# Patient Record
Sex: Male | Born: 1960 | Race: Black or African American | Hispanic: No | Smoking: Current every day smoker
Health system: Southern US, Community
[De-identification: ages and names within clinical notes are randomized; demographics above are authoritative.]

## PROBLEM LIST (undated history)

## (undated) ENCOUNTER — Emergency Department (HOSPITAL_COMMUNITY): Payer: Medicare Other

## (undated) DIAGNOSIS — K219 Gastro-esophageal reflux disease without esophagitis: Secondary | ICD-10-CM

## (undated) DIAGNOSIS — F32A Depression, unspecified: Secondary | ICD-10-CM

## (undated) DIAGNOSIS — F329 Major depressive disorder, single episode, unspecified: Secondary | ICD-10-CM

## (undated) DIAGNOSIS — M84439P Pathological fracture, unspecified ulna and radius, subsequent encounter for fracture with malunion: Secondary | ICD-10-CM

## (undated) DIAGNOSIS — I10 Essential (primary) hypertension: Secondary | ICD-10-CM

## (undated) HISTORY — PX: NO PAST SURGERIES: SHX2092

## (undated) HISTORY — PX: COLONOSCOPY: SHX174

---

## 2011-06-30 DIAGNOSIS — I10 Essential (primary) hypertension: Secondary | ICD-10-CM

## 2011-12-02 DIAGNOSIS — F1420 Cocaine dependence, uncomplicated: Secondary | ICD-10-CM | POA: Insufficient documentation

## 2012-06-03 HISTORY — PX: MANDIBLE FRACTURE SURGERY: SHX706

## 2013-04-10 DIAGNOSIS — F1290 Cannabis use, unspecified, uncomplicated: Secondary | ICD-10-CM | POA: Insufficient documentation

## 2013-04-10 DIAGNOSIS — F1210 Cannabis abuse, uncomplicated: Secondary | ICD-10-CM | POA: Insufficient documentation

## 2013-04-10 DIAGNOSIS — S0292XA Unspecified fracture of facial bones, initial encounter for closed fracture: Secondary | ICD-10-CM | POA: Insufficient documentation

## 2013-04-10 DIAGNOSIS — F1410 Cocaine abuse, uncomplicated: Secondary | ICD-10-CM | POA: Diagnosis present

## 2013-04-11 DIAGNOSIS — G8911 Acute pain due to trauma: Secondary | ICD-10-CM | POA: Insufficient documentation

## 2014-05-17 DIAGNOSIS — H5440 Blindness, one eye, unspecified eye: Secondary | ICD-10-CM | POA: Insufficient documentation

## 2014-05-17 DIAGNOSIS — H9191 Unspecified hearing loss, right ear: Secondary | ICD-10-CM | POA: Insufficient documentation

## 2014-05-17 DIAGNOSIS — F4310 Post-traumatic stress disorder, unspecified: Secondary | ICD-10-CM | POA: Insufficient documentation

## 2014-05-22 DIAGNOSIS — Z87898 Personal history of other specified conditions: Secondary | ICD-10-CM | POA: Insufficient documentation

## 2014-05-22 DIAGNOSIS — R001 Bradycardia, unspecified: Secondary | ICD-10-CM | POA: Insufficient documentation

## 2016-11-20 DIAGNOSIS — F3181 Bipolar II disorder: Secondary | ICD-10-CM | POA: Insufficient documentation

## 2016-11-20 DIAGNOSIS — H547 Unspecified visual loss: Secondary | ICD-10-CM | POA: Insufficient documentation

## 2017-04-22 ENCOUNTER — Ambulatory Visit (INDEPENDENT_AMBULATORY_CARE_PROVIDER_SITE_OTHER): Payer: Medicare Other

## 2017-04-22 ENCOUNTER — Ambulatory Visit (HOSPITAL_COMMUNITY): Payer: Medicare Other

## 2017-04-22 ENCOUNTER — Encounter (HOSPITAL_COMMUNITY): Payer: Self-pay | Admitting: Emergency Medicine

## 2017-04-22 ENCOUNTER — Other Ambulatory Visit: Payer: Self-pay

## 2017-04-22 ENCOUNTER — Ambulatory Visit (HOSPITAL_COMMUNITY)
Admission: EM | Admit: 2017-04-22 | Discharge: 2017-04-22 | Disposition: A | Discharge status: Home / Self Care | Payer: Medicare Other | Attending: Family Medicine | Admitting: Family Medicine

## 2017-04-22 DIAGNOSIS — M25571 Pain in right ankle and joints of right foot: Secondary | ICD-10-CM
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
## 2017-04-22 NOTE — Discharge Instructions (Signed)
1. Use over the counter pain relievers like ibuprofen or tylenol for pain  2. Alternate using ice and heat for swelling/discomfort.  3. Rest and elevate foot.  4. Please return if ankle fails to improve in 1-2 weeks.

## 2017-05-05 ENCOUNTER — Ambulatory Visit (INDEPENDENT_AMBULATORY_CARE_PROVIDER_SITE_OTHER): Payer: Self-pay | Admitting: Orthopaedic Surgery

## 2017-05-06 ENCOUNTER — Ambulatory Visit (INDEPENDENT_AMBULATORY_CARE_PROVIDER_SITE_OTHER): Payer: Worker's Compensation | Admitting: Orthopaedic Surgery

## 2017-05-06 ENCOUNTER — Ambulatory Visit (INDEPENDENT_AMBULATORY_CARE_PROVIDER_SITE_OTHER): Payer: Worker's Compensation

## 2017-05-06 DIAGNOSIS — S52224A Nondisplaced transverse fracture of shaft of right ulna, initial encounter for closed fracture: Secondary | ICD-10-CM | POA: Diagnosis not present

## 2017-05-06 DIAGNOSIS — S52201K Unspecified fracture of shaft of right ulna, subsequent encounter for closed fracture with nonunion: Secondary | ICD-10-CM | POA: Insufficient documentation

## 2017-05-06 MED ORDER — HYDROCODONE-ACETAMINOPHEN 5-325 MG PO TABS: 1.0000 | ORAL_TABLET | Freq: Two times a day (BID) | ORAL | 0 refills | Status: DC | PRN

## 2017-05-06 NOTE — Progress Notes (Signed)
Office Visit Note   Patient: Charles Estes           Date of Birth: April 15, 1961           MRN: [id number] Visit Date: 05/06/2017              Requested by: No referring provider defined for this encounter. PCP: System, Pcp Not In   Assessment & Plan: Visit Diagnoses:  1. Nondisplaced transverse fracture of shaft of right ulna, initial encounter for closed fracture     Plan: Impression is nondisplaced transverse ulnar fracture amenable to nonoperative treatment.  Given the location of the fracture I think a short arm cast will not provide enough stability therefore we will place him in a long-arm cast for 2 weeks.  Follow-up in 2 weeks for 2 view x-rays of the right forearm out of the cast. Total face to face encounter time was greater than 45 minutes and over half of this time was spent in counseling and/or coordination of care.  Follow-Up Instructions: Return in about 2 weeks (around 05/20/2017).   Orders:  Orders Placed This Encounter  Procedures  . XR Forearm Right   Meds ordered this encounter  Medications  . HYDROcodone-acetaminophen (NORCO) 5-325 MG tablet    Sig: Take 1-2 tablets by mouth 2 (two) times daily as needed.    Dispense:  30 tablet    Refill:  0      Procedures: No procedures performed   Clinical Data: No additional findings.   Subjective: Chief Complaint  Patient presents with  . Right Forearm - Injury    DOI 05/01/17 WORK COMP INJURY    Patient is a 56 year old gentleman who sustained a ulnar shaft fracture to his right arm on 05/01/2017 while at work.  He had his arm caught in a machine.  He was evaluated in the ED and placed in a sugar tong splint.  He was given follow-up today.  He denies any numbness and tingling.  He is right-handed and he works in [employer].    Review of Systems  Constitutional: Negative.   All other systems reviewed and are negative.    Objective: Vital Signs: There were no vitals taken for this  visit.  Physical Exam  Constitutional: He is oriented to person, place, and time. He appears well-developed and well-nourished.  HENT:  Head: Normocephalic and atraumatic.  Eyes: Pupils are equal, round, and reactive to light.  Neck: Neck supple.  Pulmonary/Chest: Effort normal.  Abdominal: Soft.  Musculoskeletal: Normal range of motion.  Neurological: He is alert and oriented to person, place, and time.  Skin: Skin is warm.  Psychiatric: He has a normal mood and affect. His behavior is normal. Judgment and thought content normal.  Nursing note and vitals reviewed.   Ortho Exam Right forearm exam shows tenderness over the fracture site.  Muscular compartments are soft.  Neurovascular intact. Specialty Comments:  No specialty comments available.  Imaging: Xr Forearm Right  Result Date: 05/06/2017 Nondisplaced ulnar fracture    PMFS History: Patient Active Problem List   Diagnosis Date Noted  . Nondisplaced transverse fracture of shaft of right ulna, initial encounter for closed fracture 05/06/2017   No past medical history on file.  No family history on file.  No past surgical history on file. Social History   Occupational History  . Not on file  Tobacco Use  . Smoking status: Never Smoker  . Smokeless tobacco: Never Used  Substance and Sexual Activity  . Alcohol  use: No    Frequency: Never  . Drug use: No  . Sexual activity: Not on file

## 2017-05-16 ENCOUNTER — Encounter (INDEPENDENT_AMBULATORY_CARE_PROVIDER_SITE_OTHER): Payer: Self-pay

## 2017-05-16 ENCOUNTER — Telehealth (INDEPENDENT_AMBULATORY_CARE_PROVIDER_SITE_OTHER): Payer: Self-pay | Admitting: Orthopaedic Surgery

## 2017-05-16 NOTE — Telephone Encounter (Signed)
Expect RTW in 6 weeks or so.

## 2017-05-16 NOTE — Telephone Encounter (Signed)
Note written

## 2017-05-16 NOTE — Telephone Encounter (Signed)
Please advise 

## 2017-05-16 NOTE — Telephone Encounter (Signed)
Patient is here at the clinic requesting a out of work note with dates of when he was taken off of work and when his expected return to work date will be.

## 2017-05-19 ENCOUNTER — Telehealth (INDEPENDENT_AMBULATORY_CARE_PROVIDER_SITE_OTHER): Payer: Self-pay

## 2017-05-19 NOTE — Telephone Encounter (Signed)
SEE MESSAGE

## 2017-05-19 NOTE — Telephone Encounter (Signed)
Patient has a follow up appt set up for tomorrow. WC adj wants Dr. Roda ShuttersXu to be aware that pts employer can accommodate any restrictions if Dr. Roda ShuttersXu thinks he can return to work light duty.

## 2017-05-19 NOTE — Telephone Encounter (Signed)
Faxed the 05/06/17 office and work note to wc adjustor per her request Fax#[phone]

## 2017-05-19 NOTE — Telephone Encounter (Signed)
Ok that's fine.  No use of affected arm for 6 weeks

## 2017-05-20 ENCOUNTER — Encounter (INDEPENDENT_AMBULATORY_CARE_PROVIDER_SITE_OTHER): Payer: Self-pay | Admitting: Orthopaedic Surgery

## 2017-05-20 ENCOUNTER — Ambulatory Visit (INDEPENDENT_AMBULATORY_CARE_PROVIDER_SITE_OTHER): Payer: Worker's Compensation | Admitting: Orthopaedic Surgery

## 2017-05-20 ENCOUNTER — Encounter (INDEPENDENT_AMBULATORY_CARE_PROVIDER_SITE_OTHER): Payer: Self-pay

## 2017-05-20 ENCOUNTER — Ambulatory Visit (INDEPENDENT_AMBULATORY_CARE_PROVIDER_SITE_OTHER): Payer: Worker's Compensation

## 2017-05-20 DIAGNOSIS — M79631 Pain in right forearm: Secondary | ICD-10-CM

## 2017-05-20 DIAGNOSIS — S52224A Nondisplaced transverse fracture of shaft of right ulna, initial encounter for closed fracture: Secondary | ICD-10-CM

## 2017-05-20 MED ORDER — SENNOSIDES-DOCUSATE SODIUM 8.6-50 MG PO TABS: 1.0000 | ORAL_TABLET | Freq: Two times a day (BID) | ORAL | 1 refills | Status: DC | PRN

## 2017-05-20 NOTE — Telephone Encounter (Signed)
Note faxed to [phone]

## 2017-05-20 NOTE — Telephone Encounter (Signed)
Note made.  

## 2017-05-20 NOTE — Progress Notes (Signed)
Patient is 2-week status post nondisplaced ulnar shaft fracture.  He has occasional pain.  Denies any real complaints.  X-rays show stable alignment.  He does have persistent swelling of his forearm.  The compartments are soft.  We converted him to a short arm cast today.  Continue nonweightbearing continue out of work.  Follow-up in 3 weeks.

## 2017-05-21 ENCOUNTER — Telehealth (INDEPENDENT_AMBULATORY_CARE_PROVIDER_SITE_OTHER): Payer: Self-pay | Admitting: Radiology

## 2017-05-21 ENCOUNTER — Other Ambulatory Visit (INDEPENDENT_AMBULATORY_CARE_PROVIDER_SITE_OTHER): Payer: Self-pay | Admitting: Family

## 2017-05-21 MED ORDER — HYDROCODONE-ACETAMINOPHEN 5-325 MG PO TABS: 1.0000 | ORAL_TABLET | Freq: Two times a day (BID) | ORAL | 0 refills | Status: DC | PRN

## 2017-05-21 NOTE — Telephone Encounter (Signed)
Patient called and is asking for a refill of hydrocodone.  He has an ulnar shaft fracture. Can you refill for him?  Roda ShuttersXu is in surgery.

## 2017-05-21 NOTE — Telephone Encounter (Signed)
Called pt and advised that rx is at the desk for pick up. 

## 2017-05-29 ENCOUNTER — Other Ambulatory Visit (INDEPENDENT_AMBULATORY_CARE_PROVIDER_SITE_OTHER): Payer: Self-pay | Admitting: Orthopaedic Surgery

## 2017-05-29 NOTE — Telephone Encounter (Signed)
Please advise. See message below.  

## 2017-05-29 NOTE — Telephone Encounter (Signed)
Patient called wanting to know if he could get a refill on his Hydrocodone. Pharmacy only filled a 5 day supply and he was requesting a 15 day supply if possible. CB # [phone]

## 2017-05-29 NOTE — Telephone Encounter (Signed)
That's fine

## 2017-05-30 MED ORDER — HYDROCODONE-ACETAMINOPHEN 5-325 MG PO TABS: 1.0000 | ORAL_TABLET | Freq: Two times a day (BID) | ORAL | 0 refills | Status: DC | PRN

## 2017-05-30 NOTE — Telephone Encounter (Signed)
IC patient and advised Rx ready for pickup 

## 2017-05-30 NOTE — Addendum Note (Signed)
Addended by: Cherre HugerMAY, Ardice Boyan E on: 05/30/2017 08:58 AM   Modules accepted: Orders

## 2017-05-30 NOTE — Telephone Encounter (Signed)
Rx printed to sign. 

## 2017-06-10 ENCOUNTER — Ambulatory Visit (INDEPENDENT_AMBULATORY_CARE_PROVIDER_SITE_OTHER): Payer: Worker's Compensation

## 2017-06-10 ENCOUNTER — Ambulatory Visit (INDEPENDENT_AMBULATORY_CARE_PROVIDER_SITE_OTHER): Payer: Worker's Compensation | Admitting: Orthopaedic Surgery

## 2017-06-10 ENCOUNTER — Encounter (INDEPENDENT_AMBULATORY_CARE_PROVIDER_SITE_OTHER): Payer: Self-pay | Admitting: Orthopaedic Surgery

## 2017-06-10 DIAGNOSIS — M7021 Olecranon bursitis, right elbow: Secondary | ICD-10-CM

## 2017-06-10 DIAGNOSIS — S52224A Nondisplaced transverse fracture of shaft of right ulna, initial encounter for closed fracture: Secondary | ICD-10-CM | POA: Diagnosis not present

## 2017-06-10 DIAGNOSIS — M79631 Pain in right forearm: Secondary | ICD-10-CM | POA: Diagnosis not present

## 2017-06-10 MED ORDER — METHYLPREDNISOLONE ACETATE 40 MG/ML IJ SUSP
13.3300 mg | INTRAMUSCULAR | Status: AC | PRN
  Administered 2017-06-10: 13.33 mg via INTRA_ARTICULAR

## 2017-06-10 MED ORDER — LIDOCAINE HCL 1 % IJ SOLN
1.0000 mL | INTRAMUSCULAR | Status: AC | PRN
  Administered 2017-06-10: 1 mL

## 2017-06-10 MED ORDER — VITAMIN D (ERGOCALCIFEROL) 1.25 MG (50000 UNIT) PO CAPS: 50000.0000 [IU] | ORAL_CAPSULE | ORAL | 0 refills | Status: DC

## 2017-06-10 MED ORDER — BUPIVACAINE HCL 0.25 % IJ SOLN
0.6600 mL | INTRAMUSCULAR | Status: AC | PRN
  Administered 2017-06-10: .66 mL via INTRA_ARTICULAR

## 2017-06-10 NOTE — Progress Notes (Signed)
   Office Visit Note   Patient: Charles Estes           Date of Birth: [birth year]           MRN: [id number] Visit Date: 06/10/2017              Requested by: No referring provider defined for this encounter. PCP: System, Pcp Not In   Assessment & Plan: Visit Diagnoses:  1. Nondisplaced transverse fracture of shaft of right ulna, initial encounter for closed fracture   2. Right forearm pain   3. Olecranon bursitis, right elbow     Plan: Impression #1 nondisplaced transverse fracture right ulnar shaft.  #2 right elbow olecranon bursitis.  In regards to the ulnar shaft fracture we are going to place down him in a removable splint.  He is to wear this at all times however we are going to let him come out or physical therapy.  He will work on elbow and wrist range of motion.  I am also going to start him on prescription strength vitamin D.  Regards to his olecranon bursa we are going to aspirate this and injected with cortisone today.  He will follow-up with us in 5 weeks time for repeat evaluation and x-ray of his ulnar shaft fracture.  Follow-Up Instructions: Return in about 5 weeks (around 07/15/2017).   Orders:  Orders Placed This Encounter  Procedures  . XR Forearm Right   No orders of the defined types were placed in this encounter.     Procedures: Medium Joint Inj: R olecranon bursa on 06/10/2017 3:00 PM Details: 22 G needle Medications: 1 mL lidocaine 1 %; 13.33 mg methylPREDNISolone acetate 40 MG/ML; 0.66 mL bupivacaine 0.25 %      Clinical Data: No additional findings.   Subjective: Chief Complaint  Patient presents with  . Right Forearm - Follow-up    HPI Lupita LeashDonna comes in, he is about 5 weeks out ulnar shaft fracture.  He has been in a short arm splint.  He has been doing well in regards to his forearm pain however he does note new onset of swelling to the posterior aspect of his elbow.  No injury no recent bug bite.  No drainage to the area.  Review of Systems as  detailed in HPI, all others reviewed and are negative.   Objective: Vital Signs: There were no vitals taken for this visit.  Physical Exam well-developed well-nourished gentleman in no acute distress.  Alert and oriented x3.  Ortho Exam examination of his right forearm reveals moderate tenderness to the ulnar shaft.  Moderate effusion to the olecranon bursa without tenseness.  Specialty Comments:  No specialty comments available.  Imaging: Xr Forearm Right  Result Date: 06/10/2017 Slowly healing ulnar shaft fracture without further displacement.    PMFS History: Patient Active Problem List   Diagnosis Date Noted  . Olecranon bursitis, right elbow 06/10/2017  . Nondisplaced transverse fracture of shaft of right ulna, initial encounter for closed fracture 05/06/2017   No past medical history on file.  No family history on file.  No past surgical history on file. Social History   Occupational History  . Not on file  Tobacco Use  . Smoking status: Never Smoker  . Smokeless tobacco: Never Used  Substance and Sexual Activity  . Alcohol use: No    Frequency: Never  . Drug use: No  . Sexual activity: Not on file

## 2017-06-11 ENCOUNTER — Encounter (INDEPENDENT_AMBULATORY_CARE_PROVIDER_SITE_OTHER): Payer: Self-pay | Admitting: Orthopedic Surgery

## 2017-06-11 ENCOUNTER — Telehealth (INDEPENDENT_AMBULATORY_CARE_PROVIDER_SITE_OTHER): Payer: Self-pay

## 2017-06-11 ENCOUNTER — Ambulatory Visit (INDEPENDENT_AMBULATORY_CARE_PROVIDER_SITE_OTHER): Payer: Worker's Compensation | Admitting: Orthopedic Surgery

## 2017-06-11 DIAGNOSIS — S52224A Nondisplaced transverse fracture of shaft of right ulna, initial encounter for closed fracture: Secondary | ICD-10-CM

## 2017-06-11 MED ORDER — HYDROCODONE-ACETAMINOPHEN 5-325 MG PO TABS: ORAL_TABLET | ORAL | 0 refills | Status: DC

## 2017-06-11 NOTE — Telephone Encounter (Signed)
Faxed the 06/10/17 office note and work note to the wc adj per her request. Fax#[phone].

## 2017-06-13 ENCOUNTER — Encounter (INDEPENDENT_AMBULATORY_CARE_PROVIDER_SITE_OTHER): Payer: Self-pay | Admitting: Orthopedic Surgery

## 2017-06-13 NOTE — Progress Notes (Signed)
   Post-Op Visit Note   Patient: Charles Estes           Date of Birth: 11-16-1960           MRN: [id number] Visit Date: 06/11/2017 PCP: System, Pcp Not In   Assessment & Plan:  Chief Complaint:  Chief Complaint  Patient presents with  . Arm Pain   Visit Diagnoses:  1. Nondisplaced transverse fracture of shaft of right ulna, initial encounter for closed fracture     Plan: Patient presents for follow-up of right ulnar shaft fracture.  The patient has been in a splint since yesterday.  He reports significant worsening of the pain since he was placed out of the cast and into the splint.  He is requesting another cast today.  He is also fairly adamantly requesting a change in provider.  This is done to accommodate patient wishes only.  His care to date has been entirely appropriate and exactly what I would have done.  On examination he does have some swelling and tenderness around the ulnar shaft fracture.  Radiographs show some angulation of the fracture but callus formation is present around the fracture edges.  These are from radiographs done yesterday.  Radiographs are shown to the patient.  Plan at this time is for a long short arm cast to be reapplied.  I think this fracture is at risk for nonunion based on the appearance of the x-rays at this point in time.  2-week return cast removal repeat radiographs and assessment.  We may need to get a CT scan at that time if there is not abundant callus formation.  Follow-Up Instructions: Return in about 2 weeks (around 06/25/2017).   Orders:  No orders of the defined types were placed in this encounter.  Meds ordered this encounter  Medications  . HYDROcodone-acetaminophen (NORCO) 5-325 MG tablet    Sig: 1 po q 8 hours prn pain    Dispense:  30 tablet    Refill:  0    Imaging: No results found.  PMFS History: Patient Active Problem List   Diagnosis Date Noted  . Olecranon bursitis, right elbow 06/10/2017  . Nondisplaced transverse  fracture of shaft of right ulna, initial encounter for closed fracture 05/06/2017   History reviewed. No pertinent past medical history.  History reviewed. No pertinent family history.  History reviewed. No pertinent surgical history. Social History   Occupational History  . Not on file  Tobacco Use  . Smoking status: Never Smoker  . Smokeless tobacco: Never Used  Substance and Sexual Activity  . Alcohol use: No    Frequency: Never  . Drug use: No  . Sexual activity: Not on file

## 2017-06-16 ENCOUNTER — Telehealth (INDEPENDENT_AMBULATORY_CARE_PROVIDER_SITE_OTHER): Payer: Self-pay

## 2017-06-16 NOTE — Telephone Encounter (Signed)
Go back to previous work restrictions

## 2017-06-16 NOTE — Telephone Encounter (Signed)
[facility] to [facility] why pt is now needing to be out of work completely when he had one handed work restriction before and employer was able to accommodate? Just needs this clarified for the employer.

## 2017-06-16 NOTE — Telephone Encounter (Signed)
Please advise. See message below.  

## 2017-06-17 ENCOUNTER — Encounter (INDEPENDENT_AMBULATORY_CARE_PROVIDER_SITE_OTHER): Payer: Self-pay

## 2017-06-17 NOTE — Telephone Encounter (Signed)
Called and [name] would like a faxed copy to her faxed to [phone] ATTN JENNIFER .

## 2017-06-26 ENCOUNTER — Ambulatory Visit (INDEPENDENT_AMBULATORY_CARE_PROVIDER_SITE_OTHER): Payer: Worker's Compensation

## 2017-06-26 ENCOUNTER — Ambulatory Visit (INDEPENDENT_AMBULATORY_CARE_PROVIDER_SITE_OTHER): Payer: Worker's Compensation | Admitting: Orthopaedic Surgery

## 2017-06-26 ENCOUNTER — Encounter (INDEPENDENT_AMBULATORY_CARE_PROVIDER_SITE_OTHER): Payer: Self-pay | Admitting: Orthopaedic Surgery

## 2017-06-26 DIAGNOSIS — S52234K Nondisplaced oblique fracture of shaft of right ulna, subsequent encounter for closed fracture with nonunion: Secondary | ICD-10-CM | POA: Diagnosis not present

## 2017-06-26 NOTE — Progress Notes (Signed)
   Office Visit Note   Patient: Charles Estes           Date of Birth: June 04, 1960           MRN: [id number] Visit Date: 06/26/2017              Requested by: No referring provider defined for this encounter. PCP: System, Pcp Not In   Assessment & Plan: Visit Diagnoses:  1. 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  At this point xrays have not demonstrated sufficient evidence of healing and patient continues to be symptomatic.  We discussed nonsurgical option of continued immobilization and bone stimulator and closed observation vs. Surgical option of bone grafting and compression plating.  Surgery gives more reliable and predictable healing.  Patient agrees to proceed with surgery after a thorough discussion of r/b/a including continued nonunion, NV injury, pain, disability.   Total face to face encounter time was greater than 25 minutes and over half of this time was spent in counseling and/or coordination of care.  Follow-Up Instructions: Return if symptoms worsen or fail to improve.   Orders:  Orders Placed This Encounter  Procedures  . XR Forearm Right   No orders of the defined types were placed in this encounter.     Procedures: No procedures performed   Clinical Data: No additional findings.   Subjective: Chief Complaint  Patient presents with  . Right Forearm - Fracture, Follow-up    Charles Estes returns today for f/u of right ulna fracture he sustained 8 weeks ago.  He continues to be symptomatic.  His swelling is improved.      Review of Systems   Objective: Vital Signs: There were no vitals taken for this visit.  Physical Exam  Ortho Exam Right forearm exam shows ttp over fracture site.  He has limitation in supination/pronation 2/2 pain and immobilization. Specialty Comments:  No specialty comments available.  Imaging: Xr Forearm Right  Result Date:  06/26/2017 Persistent fracture line with nonbridging callus formation.      PMFS History: Patient Active Problem List   Diagnosis Date Noted  . Olecranon bursitis, right elbow 06/10/2017  . Nondisplaced transverse fracture of shaft of right ulna, initial encounter for closed fracture 05/06/2017   History reviewed. No pertinent past medical history.  History reviewed. No pertinent family history.  History reviewed. No pertinent surgical history. Social History   Occupational History  . Not on file  Tobacco Use  . Smoking status: Never Smoker  . Smokeless tobacco: Never Used  Substance and Sexual Activity  . Alcohol use: No    Frequency: Never  . Drug use: No  . Sexual activity: Not on file

## 2017-07-02 ENCOUNTER — Telehealth (INDEPENDENT_AMBULATORY_CARE_PROVIDER_SITE_OTHER): Payer: Self-pay

## 2017-07-02 NOTE — Telephone Encounter (Signed)
Faxed surgery request and last office note to wc adj Rubu Jayme [employer] @ [employer] P)[phone]

## 2017-07-07 NOTE — Telephone Encounter (Signed)
Spoke with Loraine LericheMark @ [phone] who is with risk management w/ pts employer and he said to call adj Tiffany Colon @ [phone] who is now the adj. Left message for her to call me back,

## 2017-07-07 NOTE — Telephone Encounter (Signed)
Left message for wc adj Ruby Jayme Cloud[employer]alez re auth status

## 2017-07-08 NOTE — Telephone Encounter (Signed)
Received email from adj stating surgery is approved. Advised Sherrie for scheduling.  [email or web address]

## 2017-07-14 ENCOUNTER — Encounter (HOSPITAL_BASED_OUTPATIENT_CLINIC_OR_DEPARTMENT_OTHER): Payer: Self-pay | Admitting: *Deleted

## 2017-07-14 ENCOUNTER — Other Ambulatory Visit: Payer: Self-pay

## 2017-07-14 ENCOUNTER — Other Ambulatory Visit (INDEPENDENT_AMBULATORY_CARE_PROVIDER_SITE_OTHER): Payer: Self-pay

## 2017-07-14 ENCOUNTER — Telehealth (INDEPENDENT_AMBULATORY_CARE_PROVIDER_SITE_OTHER): Payer: Self-pay | Admitting: Orthopaedic Surgery

## 2017-07-14 ENCOUNTER — Telehealth (INDEPENDENT_AMBULATORY_CARE_PROVIDER_SITE_OTHER): Payer: Self-pay | Admitting: Radiology

## 2017-07-14 MED ORDER — HYDROCODONE-ACETAMINOPHEN 5-325 MG PO TABS: ORAL_TABLET | ORAL | 0 refills | Status: DC

## 2017-07-14 NOTE — Telephone Encounter (Signed)
Rx printed pending signature.  

## 2017-07-14 NOTE — Telephone Encounter (Signed)
Patient aware Rx is ready for pick up. 

## 2017-07-14 NOTE — Telephone Encounter (Signed)
Patient called asking for a refill on his hydrocodone. CB # [phone] If he doesn't answer you can leave a message

## 2017-07-14 NOTE — Telephone Encounter (Signed)
Refill #10

## 2017-07-14 NOTE — Telephone Encounter (Signed)
Please advise 

## 2017-07-16 ENCOUNTER — Ambulatory Visit (HOSPITAL_BASED_OUTPATIENT_CLINIC_OR_DEPARTMENT_OTHER): Payer: No Typology Code available for payment source | Admitting: Anesthesiology

## 2017-07-16 ENCOUNTER — Ambulatory Visit (HOSPITAL_BASED_OUTPATIENT_CLINIC_OR_DEPARTMENT_OTHER)
Admission: RE | Admit: 2017-07-16 | Discharge: 2017-07-16 | Disposition: A | Discharge status: Home / Self Care | Payer: No Typology Code available for payment source | Source: Ambulatory Visit | Attending: Orthopaedic Surgery | Admitting: Orthopaedic Surgery

## 2017-07-16 ENCOUNTER — Encounter (HOSPITAL_BASED_OUTPATIENT_CLINIC_OR_DEPARTMENT_OTHER)
Admission: RE | Disposition: A | Discharge status: Home / Self Care | Payer: Self-pay | Source: Ambulatory Visit | Attending: Orthopaedic Surgery

## 2017-07-16 ENCOUNTER — Other Ambulatory Visit: Payer: Self-pay

## 2017-07-16 ENCOUNTER — Encounter (HOSPITAL_BASED_OUTPATIENT_CLINIC_OR_DEPARTMENT_OTHER): Payer: Self-pay | Admitting: Anesthesiology

## 2017-07-16 DIAGNOSIS — I10 Essential (primary) hypertension: Secondary | ICD-10-CM | POA: Diagnosis not present

## 2017-07-16 DIAGNOSIS — S52201A Unspecified fracture of shaft of right ulna, initial encounter for closed fracture: Secondary | ICD-10-CM | POA: Diagnosis present

## 2017-07-16 DIAGNOSIS — S52201K Unspecified fracture of shaft of right ulna, subsequent encounter for closed fracture with nonunion: Secondary | ICD-10-CM

## 2017-07-16 DIAGNOSIS — F329 Major depressive disorder, single episode, unspecified: Secondary | ICD-10-CM | POA: Insufficient documentation

## 2017-07-16 DIAGNOSIS — Z79899 Other long term (current) drug therapy: Secondary | ICD-10-CM | POA: Insufficient documentation

## 2017-07-16 DIAGNOSIS — X58XXXA Exposure to other specified factors, initial encounter: Secondary | ICD-10-CM | POA: Diagnosis not present

## 2017-07-16 HISTORY — DX: Gastro-esophageal reflux disease without esophagitis: K21.9

## 2017-07-16 HISTORY — PX: ORIF ULNAR FRACTURE: SHX5417

## 2017-07-16 HISTORY — DX: Pathological fracture, unspecified ulna and radius, subsequent encounter for fracture with malunion: M84.439P

## 2017-07-16 HISTORY — DX: Essential (primary) hypertension: I10

## 2017-07-16 HISTORY — DX: Major depressive disorder, single episode, unspecified: F32.9

## 2017-07-16 HISTORY — DX: Depression, unspecified: F32.A

## 2017-07-16 LAB — POCT I-STAT, CHEM 8
BUN: 11 mg/dL (ref 6–20)
CALCIUM ION: 0.96 mmol/L — AB (ref 1.15–1.40)
CREATININE: 1 mg/dL (ref 0.61–1.24)
Chloride: 110 mmol/L (ref 101–111)
Glucose, Bld: 99 mg/dL (ref 65–99)
HCT: 40 % (ref 39.0–52.0)
Hemoglobin: 13.6 g/dL (ref 13.0–17.0)
Potassium: 4 mmol/L (ref 3.5–5.1)
Sodium: 138 mmol/L (ref 135–145)
TCO2: 21 mmol/L — AB (ref 22–32)

## 2017-07-16 SURGERY — OPEN REDUCTION INTERNAL FIXATION (ORIF) ULNAR FRACTURE
Anesthesia: General | Site: Arm Lower | Laterality: Right

## 2017-07-16 MED ORDER — PROMETHAZINE HCL 25 MG PO TABS: 25.0000 mg | ORAL_TABLET | Freq: Four times a day (QID) | ORAL | 1 refills | Status: DC | PRN

## 2017-07-16 MED ORDER — OXYCODONE-ACETAMINOPHEN 5-325 MG PO TABS: 1.0000 | ORAL_TABLET | ORAL | 0 refills | Status: DC | PRN

## 2017-07-16 MED ORDER — MIDAZOLAM HCL 5 MG/5ML IJ SOLN
INTRAMUSCULAR | Status: DC | PRN
  Administered 2017-07-16: 2 mg via INTRAVENOUS

## 2017-07-16 MED ORDER — CEFAZOLIN SODIUM-DEXTROSE 2-4 GM/100ML-% IV SOLN
2.0000 g | INTRAVENOUS | Status: AC
  Administered 2017-07-16: 2 g via INTRAVENOUS

## 2017-07-16 MED ORDER — METOCLOPRAMIDE HCL 5 MG/ML IJ SOLN: 10.0000 mg | Freq: Once | INTRAMUSCULAR | Status: DC | PRN

## 2017-07-16 MED ORDER — PROPOFOL 10 MG/ML IV BOLUS
INTRAVENOUS | Status: AC
  Filled 2017-07-16: qty 20

## 2017-07-16 MED ORDER — EPHEDRINE 5 MG/ML INJ
INTRAVENOUS | Status: AC
  Filled 2017-07-16: qty 10

## 2017-07-16 MED ORDER — FENTANYL CITRATE (PF) 100 MCG/2ML IJ SOLN
INTRAMUSCULAR | Status: AC
  Filled 2017-07-16: qty 2

## 2017-07-16 MED ORDER — LACTATED RINGERS IV SOLN
INTRAVENOUS | Status: DC
  Administered 2017-07-16 (×2): via INTRAVENOUS

## 2017-07-16 MED ORDER — SENNOSIDES-DOCUSATE SODIUM 8.6-50 MG PO TABS: 1.0000 | ORAL_TABLET | Freq: Every evening | ORAL | 1 refills | Status: DC | PRN

## 2017-07-16 MED ORDER — ROPIVACAINE HCL 7.5 MG/ML IJ SOLN
INTRAMUSCULAR | Status: DC | PRN
  Administered 2017-07-16: 20 mL via PERINEURAL

## 2017-07-16 MED ORDER — OXYCODONE HCL ER 10 MG PO T12A: 10.0000 mg | EXTENDED_RELEASE_TABLET | Freq: Two times a day (BID) | ORAL | 0 refills | Status: DC

## 2017-07-16 MED ORDER — CEFAZOLIN SODIUM-DEXTROSE 2-4 GM/100ML-% IV SOLN
INTRAVENOUS | Status: AC
  Filled 2017-07-16: qty 100

## 2017-07-16 MED ORDER — MIDAZOLAM HCL 2 MG/2ML IJ SOLN
1.0000 mg | INTRAMUSCULAR | Status: DC | PRN
  Administered 2017-07-16: 2 mg via INTRAVENOUS

## 2017-07-16 MED ORDER — MIDAZOLAM HCL 2 MG/2ML IJ SOLN
INTRAMUSCULAR | Status: AC
  Filled 2017-07-16: qty 2

## 2017-07-16 MED ORDER — FENTANYL CITRATE (PF) 100 MCG/2ML IJ SOLN
50.0000 ug | INTRAMUSCULAR | Status: DC | PRN
  Administered 2017-07-16: 100 ug via INTRAVENOUS

## 2017-07-16 MED ORDER — PROPOFOL 10 MG/ML IV BOLUS
INTRAVENOUS | Status: DC | PRN
  Administered 2017-07-16: 200 mg via INTRAVENOUS
  Administered 2017-07-16: 100 mg via INTRAVENOUS

## 2017-07-16 MED ORDER — ZINC SULFATE 220 (50 ZN) MG PO CAPS: 220.0000 mg | ORAL_CAPSULE | Freq: Every day | ORAL | 0 refills | Status: DC

## 2017-07-16 MED ORDER — DEXAMETHASONE SODIUM PHOSPHATE 4 MG/ML IJ SOLN
INTRAMUSCULAR | Status: DC | PRN
  Administered 2017-07-16: 10 mg via INTRAVENOUS

## 2017-07-16 MED ORDER — LACTATED RINGERS IV SOLN: INTRAVENOUS | Status: DC

## 2017-07-16 MED ORDER — DEXAMETHASONE SODIUM PHOSPHATE 10 MG/ML IJ SOLN
INTRAMUSCULAR | Status: AC
  Filled 2017-07-16: qty 1

## 2017-07-16 MED ORDER — LIDOCAINE 2% (20 MG/ML) 5 ML SYRINGE
INTRAMUSCULAR | Status: AC
  Filled 2017-07-16: qty 5

## 2017-07-16 MED ORDER — CHLORHEXIDINE GLUCONATE 4 % EX LIQD: 60.0000 mL | Freq: Once | CUTANEOUS | Status: DC

## 2017-07-16 MED ORDER — ONDANSETRON HCL 4 MG/2ML IJ SOLN
INTRAMUSCULAR | Status: DC | PRN
  Administered 2017-07-16: 4 mg via INTRAVENOUS

## 2017-07-16 MED ORDER — CALCIUM CARBONATE-VITAMIN D 500-200 MG-UNIT PO TABS: 1.0000 | ORAL_TABLET | Freq: Three times a day (TID) | ORAL | 12 refills | Status: DC

## 2017-07-16 MED ORDER — LIDOCAINE HCL (CARDIAC) 20 MG/ML IV SOLN
INTRAVENOUS | Status: DC | PRN
  Administered 2017-07-16: 30 mg via INTRAVENOUS

## 2017-07-16 MED ORDER — TIZANIDINE HCL 4 MG PO TABS: 4.0000 mg | ORAL_TABLET | Freq: Four times a day (QID) | ORAL | 2 refills | Status: DC | PRN

## 2017-07-16 MED ORDER — SCOPOLAMINE 1 MG/3DAYS TD PT72: 1.0000 | MEDICATED_PATCH | Freq: Once | TRANSDERMAL | Status: DC | PRN

## 2017-07-16 MED ORDER — EPHEDRINE SULFATE 50 MG/ML IJ SOLN
INTRAMUSCULAR | Status: DC | PRN
  Administered 2017-07-16: 25 mg via INTRAVENOUS
  Administered 2017-07-16: 10 mg via INTRAVENOUS

## 2017-07-16 MED ORDER — MEPERIDINE HCL 25 MG/ML IJ SOLN: 6.2500 mg | INTRAMUSCULAR | Status: DC | PRN

## 2017-07-16 MED ORDER — LACTATED RINGERS IV SOLN
INTRAVENOUS | Status: DC
  Administered 2017-07-16: 09:00:00 via INTRAVENOUS

## 2017-07-16 MED ORDER — ONDANSETRON HCL 4 MG PO TABS: 4.0000 mg | ORAL_TABLET | Freq: Three times a day (TID) | ORAL | 0 refills | Status: DC | PRN

## 2017-07-16 MED ORDER — ONDANSETRON HCL 4 MG/2ML IJ SOLN
INTRAMUSCULAR | Status: AC
  Filled 2017-07-16: qty 2

## 2017-07-16 MED ORDER — FENTANYL CITRATE (PF) 100 MCG/2ML IJ SOLN: 25.0000 ug | INTRAMUSCULAR | Status: DC | PRN

## 2017-07-16 SURGICAL SUPPLY — 69 items
BANDAGE ACE 4X5 VEL STRL LF (GAUZE/BANDAGES/DRESSINGS) ×6 IMPLANT
BIT DRILL 2.6 (BIT) ×3 IMPLANT
BLADE HEX COATED 2.75 (ELECTRODE) ×3 IMPLANT
BLADE SURG 15 STRL LF DISP TIS (BLADE) ×1 IMPLANT
BLADE SURG 15 STRL SS (BLADE) ×2
BNDG COHESIVE 4X5 TAN STRL (GAUZE/BANDAGES/DRESSINGS) ×3 IMPLANT
BNDG ESMARK 4X9 LF (GAUZE/BANDAGES/DRESSINGS) ×3 IMPLANT
BONE CHIP PRESERV 5CC PCAN5 (Bone Implant) ×3 IMPLANT
CLOSURE WOUND 1/4X4 (GAUZE/BANDAGES/DRESSINGS)
CORD BIPOLAR FORCEPS 12FT (ELECTRODE) ×3 IMPLANT
COVER BACK TABLE 60X90IN (DRAPES) ×3 IMPLANT
CUFF TOURN SGL LL 18 NRW (TOURNIQUET CUFF) ×6 IMPLANT
DECANTER SPIKE VIAL GLASS SM (MISCELLANEOUS) IMPLANT
DERMABOND ADVANCED (GAUZE/BANDAGES/DRESSINGS) ×2
DERMABOND ADVANCED .7 DNX12 (GAUZE/BANDAGES/DRESSINGS) ×1 IMPLANT
DRAPE EXTREMITY T 121X128X90 (DRAPE) ×3 IMPLANT
DRAPE IMP U-DRAPE 54X76 (DRAPES) ×3 IMPLANT
DRAPE INCISE IOBAN 66X45 STRL (DRAPES) ×3 IMPLANT
DRAPE OEC MINIVIEW 54X84 (DRAPES) ×3 IMPLANT
DRAPE SURG 17X23 STRL (DRAPES) ×3 IMPLANT
GAUZE SPONGE 4X4 12PLY STRL (GAUZE/BANDAGES/DRESSINGS) ×3 IMPLANT
GAUZE XEROFORM 1X8 LF (GAUZE/BANDAGES/DRESSINGS) ×3 IMPLANT
GLOVE BIOGEL PI IND STRL 7.0 (GLOVE) ×1 IMPLANT
GLOVE BIOGEL PI INDICATOR 7.0 (GLOVE) ×2
GLOVE ECLIPSE 7.0 STRL STRAW (GLOVE) ×3 IMPLANT
GLOVE SKINSENSE NS SZ7.5 (GLOVE) ×2
GLOVE SKINSENSE STRL SZ7.5 (GLOVE) ×1 IMPLANT
GLOVE SURG SYN 7.5  E (GLOVE) ×2
GLOVE SURG SYN 7.5 E (GLOVE) ×1 IMPLANT
GOWN STRL REIN XL XLG (GOWN DISPOSABLE) ×3 IMPLANT
GOWN STRL REUS W/ TWL LRG LVL3 (GOWN DISPOSABLE) ×1 IMPLANT
GOWN STRL REUS W/TWL LRG LVL3 (GOWN DISPOSABLE) ×2
KIT INFUSE SMALL (Orthopedic Implant) ×3 IMPLANT
MANIFOLD NEPTUNE II (INSTRUMENTS) ×3 IMPLANT
NEEDLE HYPO 25X1 1.5 SAFETY (NEEDLE) IMPLANT
NS IRRIG 1000ML POUR BTL (IV SOLUTION) ×3 IMPLANT
PACK BASIN DAY SURGERY FS (CUSTOM PROCEDURE TRAY) ×3 IMPLANT
PAD CAST 4YDX4 CTTN HI CHSV (CAST SUPPLIES) ×1 IMPLANT
PADDING CAST ABS 4INX4YD NS (CAST SUPPLIES)
PADDING CAST ABS COTTON 4X4 ST (CAST SUPPLIES) IMPLANT
PADDING CAST COTTON 4X4 STRL (CAST SUPPLIES) ×2
PENCIL BUTTON HOLSTER BLD 10FT (ELECTRODE) IMPLANT
PLATE COMP STRT NARROW 8H (Plate) ×3 IMPLANT
SCREW BONE 3.5X16MM (Screw) ×3 IMPLANT
SCREW BONE 3.5X18MM (Screw) ×12 IMPLANT
SCREW BONE 3.5X20MM (Screw) ×3 IMPLANT
SHEET MEDIUM DRAPE 40X70 STRL (DRAPES) ×3 IMPLANT
SLEEVE SCD COMPRESS KNEE MED (MISCELLANEOUS) ×3 IMPLANT
SLING ARM FOAM STRAP LRG (SOFTGOODS) ×3 IMPLANT
SPLINT FIBERGLASS 4X30 (CAST SUPPLIES) ×3 IMPLANT
SPONGE LAP 18X18 X RAY DECT (DISPOSABLE) ×3 IMPLANT
STOCKINETTE 4X48 STRL (DRAPES) ×3 IMPLANT
STRIP CLOSURE SKIN 1/4X4 (GAUZE/BANDAGES/DRESSINGS) IMPLANT
SUCTION FRAZIER HANDLE 10FR (MISCELLANEOUS)
SUCTION TUBE FRAZIER 10FR DISP (MISCELLANEOUS) IMPLANT
SUT ETHILON 3 0 PS 1 (SUTURE) ×3 IMPLANT
SUT MNCRL AB 4-0 PS2 18 (SUTURE) IMPLANT
SUT VIC AB 0 CT1 27 (SUTURE) ×2
SUT VIC AB 0 CT1 27XBRD ANBCTR (SUTURE) ×1 IMPLANT
SUT VIC AB 2-0 CT1 27 (SUTURE) ×2
SUT VIC AB 2-0 CT1 TAPERPNT 27 (SUTURE) ×1 IMPLANT
SYR BULB 3OZ (MISCELLANEOUS) ×3 IMPLANT
SYR CONTROL 10ML LL (SYRINGE) IMPLANT
TOWEL OR 17X24 6PK STRL BLUE (TOWEL DISPOSABLE) ×6 IMPLANT
TOWEL OR NON WOVEN STRL DISP B (DISPOSABLE) ×3 IMPLANT
TUBE CONNECTING 20'X1/4 (TUBING) ×1
TUBE CONNECTING 20X1/4 (TUBING) ×2 IMPLANT
UNDERPAD 30X30 (UNDERPADS AND DIAPERS) ×3 IMPLANT
YANKAUER SUCT BULB TIP NO VENT (SUCTIONS) ×3 IMPLANT

## 2017-07-16 NOTE — H&P (Signed)
    PREOPERATIVE H&P  Chief Complaint: right ulna nonunion  HPI: Charles Estes is a 57 y.o. male who presents for surgical treatment of right ulna nonunion.  He denies any changes in medical history.  Past Medical History:  Diagnosis Date  . Depression   . GERD (gastroesophageal reflux disease)   . Hypertension   . Pathologic ulnar fracture with malunion    right   Past Surgical History:  Procedure Laterality Date  . MANDIBLE FRACTURE SURGERY  2014   Social History   Socioeconomic History  . Marital status: Single    Spouse name: None  . Number of children: None  . Years of education: None  . Highest education level: None  Social Needs  . Financial resource strain: None  . Food insecurity - worry: None  . Food insecurity - inability: None  . Transportation needs - medical: None  . Transportation needs - non-medical: None  Occupational History  . None  Tobacco Use  . Smoking status: Never Smoker  . Smokeless tobacco: Never Used  Substance and Sexual Activity  . Alcohol use: Yes    Frequency: Never    Comment: social  . Drug use: No  . Sexual activity: None  Other Topics Concern  . None  Social History Narrative  . None   History reviewed. No pertinent family history. Allergies  Allergen Reactions  . Paxil [Paroxetine]     Per pt sores in mouth and diarrhea    Prior to Admission medications   Medication Sig Start Date End Date Taking? Authorizing Provider  amLODipine (NORVASC) 10 MG tablet  04/15/17  Yes [provider]  FLUoxetine (PROZAC) 20 MG capsule Take by mouth. 05/05/17  Yes [provider]  HYDROcodone-acetaminophen (NORCO) 5-325 MG tablet 1 po q 8 hours prn pain 07/14/17  Yes Tarry KosXu, Anett Ranker M, MD  QUEtiapine Fumarate (SEROQUEL PO) Take 250 mg by mouth at bedtime.   Yes [provider]  senna-docusate (SENOKOT S) 8.6-50 MG tablet Take 1 tablet by mouth 2 (two) times daily as needed. 05/20/17  Yes Tarry KosXu, Benjamen Koelling M, MD      Positive ROS: All other systems have been reviewed and were otherwise negative with the exception of those mentioned in the HPI and as above.  Physical Exam: General: Alert, no acute distress Cardiovascular: No pedal edema Respiratory: No cyanosis, no use of accessory musculature GI: abdomen soft Skin: No lesions in the area of chief complaint Neurologic: Sensation intact distally Psychiatric: Patient is competent for consent with normal mood and affect Lymphatic: no lymphedema  MUSCULOSKELETAL: exam stable  Assessment: right ulna nonunion  Plan: Plan for Procedure(s): OPEN REDUCTION INTERNAL FIXATION (ORIF) RIGHT ULNA FRACTURE NONUNION  The risks benefits and alternatives were discussed with the patient including but not limited to the risks of nonoperative treatment, versus surgical intervention including infection, bleeding, nerve injury,  blood clots, cardiopulmonary complications, morbidity, mortality, among others, and they were willing to proceed.   Charles ArvinMichael Yaffa Seckman, MD   07/16/2017 8:27 AM

## 2017-07-16 NOTE — Anesthesia Preprocedure Evaluation (Signed)
Anesthesia Evaluation  Patient identified by MRN, date of birth, ID band Patient awake    Reviewed: Allergy & Precautions, NPO status , Patient's Chart, lab work & pertinent test results  Airway Mallampati: II  TM Distance: >3 FB Neck ROM: Full    Dental no notable dental hx.    Pulmonary neg pulmonary ROS,    Pulmonary exam normal breath sounds clear to auscultation       Cardiovascular hypertension, Pt. on medications negative cardio ROS Normal cardiovascular exam Rhythm:Regular Rate:Normal     Neuro/Psych negative neurological ROS  negative psych ROS   GI/Hepatic Neg liver ROS, GERD  Controlled,  Endo/Other  negative endocrine ROS  Renal/GU negative Renal ROS  negative genitourinary   Musculoskeletal negative musculoskeletal ROS (+)   Abdominal   Peds negative pediatric ROS (+)  Hematology negative hematology ROS (+)   Anesthesia Other Findings   Reproductive/Obstetrics negative OB ROS                             Anesthesia Physical Anesthesia Plan  ASA: II  Anesthesia Plan: General   Post-op Pain Management: GA combined w/ Regional for post-op pain   Induction: Intravenous  PONV Risk Score and Plan: 2 and Ondansetron and Treatment may vary due to age or medical condition  Airway Management Planned: LMA  Additional Equipment:   Intra-op Plan:   Post-operative Plan: Extubation in OR  Informed Consent: I have reviewed the patients History and Physical, chart, labs and discussed the procedure including the risks, benefits and alternatives for the proposed anesthesia with the patient or authorized representative who has indicated his/her understanding and acceptance.   Dental advisory given  Plan Discussed with: CRNA  Anesthesia Plan Comments:         Anesthesia Quick Evaluation

## 2017-07-16 NOTE — Op Note (Signed)
   Date of Surgery: 07/16/2017  INDICATIONS: [name] is a 57 y.o.-year-old male with a right ulna nonunion;  The patient did consent to the procedure after discussion of the risks and benefits.  PREOPERATIVE DIAGNOSIS: Hypertrophic nonunion of right ulnar shaft  POSTOPERATIVE DIAGNOSIS: Same.  PROCEDURE: Open reduction internal fixation of hypertrophic nonunion right ulna with compression plating  SURGEON: N. Glee ArvinMichael Xu, M.D.  ASSIST: Starlyn SkeansMary Lindsey MaldenStanbery, [place]; necessary for the timely completion of procedure and due to complexity of procedure.  ANESTHESIA:  general  IV FLUIDS AND URINE: See anesthesia.  ESTIMATED BLOOD LOSS: Minimal mL.  IMPLANTS: Stryker 8 hole 3.5 mm compression plate; 5 cc cancellus bone chips; 2.8 cc Infuse bone graft  DRAINS: None  COMPLICATIONS: None.  DESCRIPTION OF PROCEDURE: The patient was brought to the operating room and placed supine on the operating table.  The patient had been signed prior to the procedure and this was documented. The patient had the anesthesia placed by the anesthesiologist.  A time-out was performed to confirm that this was the correct patient, site, side and location. The patient did receive antibiotics prior to the incision and was re-dosed during the procedure as needed at indicated intervals.  A tourniquet was placed.  The patient had the operative extremity prepped and draped in the standard surgical fashion.    A longitudinal incision was made over the subcutaneous border of the ulnar shaft centered over the nonunion site.  Dissection was carried down to the fascia.  The FCU and ECU muscle bellies were elevated off of the ulnar shaft.  The nonunion was identified.  This was a fibrous hypertrophic nonunion.  Nonunion was then taken down using a rongeur, osteotome, bur.  The bur was used to debride the bone back to punctate bleeding.  After this was done we then placed a 3.5 mm compression plate on the dorsal aspect of the ulna.   Nonlocking bicortical screws were placed each with excellent purchase.  I was able to gain compression of the fracture through the plate by drilling eccentrically.  The compression of the fracture was visualized as I tightened the screws down.  This gave excellent cortical contact.  After this was done I then placed 2.8 cc of infused bone graft with 5 cc of cancellus bone chips within the fracture site.  The wound was then thoroughly irrigated.  Hemostasis was obtained.  The surgical wound was closed in a layered fashion using 0 Vicryl for the fascia, 2-0 Vicryl for the subcutaneous layer and 3-0 nylon for the skin.  Sterile dressings were applied.  The arm was placed in a sugar tong splint.  Patient tolerated procedure well had no immediate complications.  POSTOPERATIVE PLAN: Patient will return to the office in 2 weeks for suture removal.  He will need 2 view x-rays of the right forearm on return.  He is to remain out of work until follow-up.  Mayra ReelN. Michael Xu, MD [facility] [phone] 10:59 AM

## 2017-07-16 NOTE — Addendum Note (Signed)
Addendum  created 07/16/17 1413 by [place] DesanctisLinka, Rigel Filsinger L, CRNA   Charge Capture section accepted

## 2017-07-16 NOTE — Anesthesia Procedure Notes (Signed)
Anesthesia Regional Block: Supraclavicular block   Pre-Anesthetic Checklist: ,, timeout performed, Correct Patient, Correct Site, Correct Laterality, Correct Procedure, Correct Position, site marked, Risks and benefits discussed,  Surgical consent,  Pre-op evaluation,  At surgeon's request and post-op pain management  Laterality: Right and Upper  Prep: Maximum Sterile Barrier Precautions used, chloraprep       Needles:  Injection technique: Single-shot  Needle Type: Echogenic Stimulator Needle     Needle Length: 10cm      Additional Needles:   Procedures:,,,, ultrasound used (permanent image in chart),,,,  Narrative:  Start time: 07/16/2017 8:57 AM End time: 07/16/2017 9:07 AM Injection made incrementally with aspirations every 5 mL.  Performed by: Personally  Anesthesiologist: Phillips Groutarignan, Galo Sayed, MD  Additional Notes: Risks, benefits and alternative to block explained extensively.  Patient tolerated procedure well, without complications.

## 2017-07-16 NOTE — Transfer of Care (Signed)
Immediate Anesthesia Transfer of Care Note  Patient: Charles Estes  Procedure(s) Performed: OPEN REDUCTION INTERNAL FIXATION (ORIF) RIGHT ULNA FRACTURE NONUNION (Right Arm Lower)  Patient Location: PACU  Anesthesia Type:GA combined with regional for post-op pain  Level of Consciousness: sedated  Airway & Oxygen Therapy: Patient Spontanous Breathing and Patient connected to face mask oxygen  Post-op Assessment: Report given to RN and Post -op Vital signs reviewed and stable  Post vital signs: Reviewed and stable  Last Vitals:  Vitals:   07/16/17 0855 07/16/17 0900  BP: (!) 114/93 (!) 114/93  Pulse: 65 65  Resp:    Temp:    SpO2: 100% 100%    Last Pain:  Vitals:   07/16/17 0822  TempSrc: Oral         Complications: No apparent anesthesia complications

## 2017-07-16 NOTE — Discharge Instructions (Signed)
Postoperative instructions:  Weightbearing instructions: non weight bearing  Dressing instructions: Keep your dressing and/or splint clean and dry at all times.  It will be removed at your first post-operative appointment.  Your stitches and/or staples will be removed at this visit.  Incision instructions:  Do not soak your incision for 3 weeks after surgery.  If the incision gets wet, pat dry and do not scrub the incision.  Pain control:  You have been given a prescription to be taken as directed for post-operative pain control.  In addition, elevate the operative extremity above the heart at all times to prevent swelling and throbbing pain.  Take over-the-counter Colace, 100mg  by mouth twice a day while taking narcotic pain medications to help prevent constipation.  Follow up appointments: 1) 10-14 days for suture removal and wound check. 2) Dr. Roda Shutters as scheduled. 3. Remain out of work until follow up appointment   -------------------------------------------------------------------------------------------------------------  After Surgery Pain Control:  After your surgery, post-surgical discomfort or pain is likely. This discomfort can last several days to a few weeks. At certain times of the day your discomfort may be more intense.  Did you receive a nerve block?  A nerve block can provide pain relief for one hour to two days after your surgery. As long as the nerve block is working, you will experience little or no sensation in the area the surgeon operated on.  As the nerve block wears off, you will begin to experience pain or discomfort. It is very important that you begin taking your prescribed pain medication before the nerve block fully wears off. Treating your pain at the first sign of the block wearing off will ensure your pain is better controlled and more tolerable when full-sensation returns. Do not wait until the pain is intolerable, as the medicine will be less effective. It is  better to treat pain in advance than to try and catch up.  General Anesthesia:  If you did not receive a nerve block during your surgery, you will need to start taking your pain medication shortly after your surgery and should continue to do so as prescribed by your surgeon.  Pain Medication:  Most commonly we prescribe Vicodin and Percocet for post-operative pain. Both of these medications contain a combination of acetaminophen (Tylenol) and a narcotic to help control pain.   It takes between 30 and 45 minutes before pain medication starts to work. It is important to take your medication before your pain level gets too intense.   Nausea is a common side effect of many pain medications. You will want to eat something before taking your pain medicine to help prevent nausea.   If you are taking a prescription pain medication that contains acetaminophen, we recommend that you do not take additional over the counter acetaminophen (Tylenol).  Other pain relieving options:   Using a cold pack to ice the affected area a few times a day (15 to 20 minutes at a time) can help to relieve pain, reduce swelling and bruising.   Elevation of the affected area can also help to reduce pain and swelling.       Call your surgeon if you experience:   1.  Fever over 101.0. 2.  Inability to urinate. 3.  Nausea and/or vomiting. 4.  Extreme swelling or bruising at the surgical site. 5.  Continued bleeding from the incision. 6.  Increased pain, redness or drainage from the incision. 7.  Problems related to your pain medication. 8.  Any problems and/or concerns    Post Anesthesia Home Care Instructions  Activity: Get plenty of rest for the remainder of the day. A responsible individual must stay with you for 24 hours following the procedure.  For the next 24 hours, DO NOT: -Drive a car -Advertising copywriterperate machinery -Drink alcoholic beverages -Take any medication unless instructed by your physician -Make any  legal decisions or sign important papers.  Meals: Start with liquid foods such as gelatin or soup. Progress to regular foods as tolerated. Avoid greasy, spicy, heavy foods. If nausea and/or vomiting occur, drink only clear liquids until the nausea and/or vomiting subsides. Call your physician if vomiting continues.  Special Instructions/Symptoms: Your throat may feel dry or sore from the anesthesia or the breathing tube placed in your throat during surgery. If this causes discomfort, gargle with warm salt water. The discomfort should disappear within 24 hours.  If you had a scopolamine patch placed behind your ear for the management of post- operative nausea and/or vomiting:  1. The medication in the patch is effective for 72 hours, after which it should be removed.  Wrap patch in a tissue and discard in the trash. Wash hands thoroughly with soap and water. 2. You may remove the patch earlier than 72 hours if you experience unpleasant side effects which may include dry mouth, dizziness or visual disturbances. 3. Avoid touching the patch. Wash your hands with soap and water after contact with the patch.     Regional Anesthesia Blocks  1. Numbness or the inability to move the "blocked" extremity may last from 3-48 hours after placement. The length of time depends on the medication injected and your individual response to the medication. If the numbness is not going away after 48 hours, call your surgeon.  2. The extremity that is blocked will need to be protected until the numbness is gone and the  Strength has returned. Because you cannot feel it, you will need to take extra care to avoid injury. Because it may be weak, you may have difficulty moving it or using it. You may not know what position it is in without looking at it while the block is in effect.  3. For blocks in the legs and feet, returning to weight bearing and walking needs to be done carefully. You will need to wait until the  numbness is entirely gone and the strength has returned. You should be able to move your leg and foot normally before you try and bear weight or walk. You will need someone to be with you when you first try to ensure you do not fall and possibly risk injury.  4. Bruising and tenderness at the needle site are common side effects and will resolve in a few days.  5. Persistent numbness or new problems with movement should be communicated to the surgeon or the [facility] [phone])/ [facility] [phone]).

## 2017-07-16 NOTE — Anesthesia Postprocedure Evaluation (Signed)
Anesthesia Post Note  Patient: Charles Estes  Procedure(s) Performed: OPEN REDUCTION INTERNAL FIXATION (ORIF) RIGHT ULNA FRACTURE NONUNION (Right Arm Lower)     Patient location during evaluation: PACU Anesthesia Type: General Level of consciousness: awake and alert Pain management: pain level controlled Vital Signs Assessment: post-procedure vital signs reviewed and stable Respiratory status: spontaneous breathing, nonlabored ventilation, respiratory function stable and patient connected to nasal cannula oxygen Cardiovascular status: blood pressure returned to baseline and stable Postop Assessment: no apparent nausea or vomiting Anesthetic complications: no    Last Vitals:  Vitals:   07/16/17 1215 07/16/17 1230  BP: 138/84 132/82  Pulse: 72 73  Resp: 19 15  Temp:    SpO2: 96% 94%    Last Pain:  Vitals:   07/16/17 1230  TempSrc:   PainSc: 0-No pain                 Phillips Groutarignan, Marcianna Daily

## 2017-07-16 NOTE — Anesthesia Procedure Notes (Signed)
Procedure Name: LMA Insertion Date/Time: 07/16/2017 9:45 AM Performed by: Terral DesanctisLinka, Natosha Bou L, CRNA Pre-anesthesia Checklist: Patient identified, Emergency Drugs available, Suction available, Patient being monitored and Timeout performed Patient Re-evaluated:Patient Re-evaluated prior to induction Oxygen Delivery Method: Circle system utilized Preoxygenation: Pre-oxygenation with 100% oxygen Induction Type: IV induction Ventilation: Mask ventilation without difficulty LMA: LMA inserted LMA Size: 5.0 Number of attempts: 1 Airway Equipment and Method: Bite block Placement Confirmation: positive ETCO2 Tube secured with: Tape Dental Injury: Teeth and Oropharynx as per pre-operative assessment

## 2017-07-16 NOTE — Progress Notes (Signed)
Assisted Dr. Carignan with right, ultrasound guided, supraclavicular block. Side rails up, monitors on throughout procedure. See vital signs in flow sheet. Tolerated Procedure well. 

## 2017-07-17 ENCOUNTER — Encounter (HOSPITAL_BASED_OUTPATIENT_CLINIC_OR_DEPARTMENT_OTHER): Payer: Self-pay | Admitting: Orthopaedic Surgery

## 2017-07-18 ENCOUNTER — Ambulatory Visit (INDEPENDENT_AMBULATORY_CARE_PROVIDER_SITE_OTHER): Payer: Worker's Compensation | Admitting: Orthopaedic Surgery

## 2017-07-18 ENCOUNTER — Encounter (INDEPENDENT_AMBULATORY_CARE_PROVIDER_SITE_OTHER): Payer: Self-pay

## 2017-07-31 ENCOUNTER — Ambulatory Visit (INDEPENDENT_AMBULATORY_CARE_PROVIDER_SITE_OTHER): Payer: Worker's Compensation | Admitting: Orthopaedic Surgery

## 2017-07-31 ENCOUNTER — Ambulatory Visit (INDEPENDENT_AMBULATORY_CARE_PROVIDER_SITE_OTHER): Payer: Worker's Compensation

## 2017-07-31 DIAGNOSIS — S52234K Nondisplaced oblique fracture of shaft of right ulna, subsequent encounter for closed fracture with nonunion: Secondary | ICD-10-CM

## 2017-07-31 MED ORDER — OXYCODONE HCL 5 MG PO TABS: 5.0000 mg | ORAL_TABLET | Freq: Every day | ORAL | 0 refills | Status: DC | PRN

## 2017-07-31 NOTE — Progress Notes (Signed)
[name] is 2-week status post repair of his ulnar shaft nonunion.  He has no real complaints today.  His forearm compartments are soft.  The incision has healed without any evidence of infection.  He is neurovascularly intact.  His x-rays demonstrate stable fixation and alignment without any complications.  Today we remove the sutures and gave him a brace for his forearm.  He needs to begin OT for elbow and wrist range of motion.  Continue nonweightbearing.  Continue out of work for now.  Continue taking calcium, vitamin D, zinc to promote bone healing.  Prescription for bone stimulator.  This was all coordinated with the case manager who was present today.

## 2017-08-06 ENCOUNTER — Telehealth (INDEPENDENT_AMBULATORY_CARE_PROVIDER_SITE_OTHER): Payer: Self-pay

## 2017-08-06 NOTE — Telephone Encounter (Signed)
Faxed pts 07/31/17 office note, work note, and rx for bone stimulator to case mgr per his request

## 2017-08-15 ENCOUNTER — Telehealth (INDEPENDENT_AMBULATORY_CARE_PROVIDER_SITE_OTHER): Payer: Self-pay

## 2017-08-15 NOTE — Telephone Encounter (Signed)
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

## 2017-08-18 NOTE — Telephone Encounter (Signed)
Please advise 

## 2017-08-19 NOTE — Telephone Encounter (Signed)
Trey's bone stim.  Don't know the brand.

## 2017-08-19 NOTE — Telephone Encounter (Signed)
See message below. Do you need a script?

## 2017-08-20 NOTE — Telephone Encounter (Signed)
Emailed Thurston Poundsrey regarding the Brand of Bone Stim. Rx made will give to Amy.

## 2017-08-20 NOTE — Telephone Encounter (Signed)
[employer]. Cpt code [phone]. Gillie Mannersrey Stewart with [employer] is going to reach out to her to get set up.

## 2017-08-28 ENCOUNTER — Ambulatory Visit (INDEPENDENT_AMBULATORY_CARE_PROVIDER_SITE_OTHER): Payer: Worker's Compensation | Admitting: Orthopaedic Surgery

## 2017-08-28 ENCOUNTER — Ambulatory Visit (INDEPENDENT_AMBULATORY_CARE_PROVIDER_SITE_OTHER): Payer: Worker's Compensation

## 2017-08-28 ENCOUNTER — Encounter (INDEPENDENT_AMBULATORY_CARE_PROVIDER_SITE_OTHER): Payer: Self-pay | Admitting: Orthopaedic Surgery

## 2017-08-28 DIAGNOSIS — M79631 Pain in right forearm: Secondary | ICD-10-CM

## 2017-08-28 DIAGNOSIS — S52234K Nondisplaced oblique fracture of shaft of right ulna, subsequent encounter for closed fracture with nonunion: Secondary | ICD-10-CM

## 2017-08-28 MED ORDER — ZINC SULFATE 220 (50 ZN) MG PO CAPS: 220.0000 mg | ORAL_CAPSULE | Freq: Every day | ORAL | 0 refills | Status: DC

## 2017-08-28 NOTE — Progress Notes (Signed)
   Post-Op Visit Note   Patient: Charles Estes           Date of Birth: [birth year]           MRN: [id number] Visit Date: 08/28/2017 PCP: System, Pcp Not In   Assessment & Plan:  Chief Complaint:  Chief Complaint  Patient presents with  . Right Forearm - Pain   Visit Diagnoses:  1. 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 Right forearm pain     Plan: Patient is 6 weeks status post ORIF right ulnar shaft nonunion.  He is overall doing better.  His swelling is improved.  His surgical scar is fully healed.  He has no focal motor or sensory deficits.  He does have some expected stiffness of his elbow and wrist and pronation supination.  No signs of CRPS.  X-rays demonstrate increasing bony consolidation fracture healing without any hardware complications.  At this point we will have him start OT.  Continue with bone stimulator.  Light duty work note provided.  Follow-up in 6 weeks with 2 view x-rays of the right forearm.  Follow-Up Instructions: Return in about 6 weeks (around 10/09/2017).   Orders:  Orders Placed This Encounter  Procedures  . XR Forearm Right   Meds ordered this encounter  Medications  . zinc sulfate 220 (50 Zn) MG capsule    Sig: Take 1 capsule (220 mg total) by mouth daily.    Dispense:  42 capsule    Refill:  0    Imaging: Xr Forearm Right  Result Date: 08/28/2017 Stable fixation with evidence of increasing bony consolidation and fracture healing   PMFS History: Patient Active Problem List   Diagnosis Date Noted  . Olecranon bursitis, right elbow 06/10/2017  . 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 Depression   . GERD (gastroesophageal reflux disease)   . Hypertension   . Pathologic ulnar fracture with malunion    right    History reviewed. No pertinent family history.  Past Surgical History:  Procedure Laterality Date  . MANDIBLE FRACTURE SURGERY  2014  . ORIF  ULNAR FRACTURE Right 07/16/2017   Procedure: OPEN REDUCTION INTERNAL FIXATION (ORIF) RIGHT ULNA FRACTURE NONUNION;  Surgeon: Tarry KosXu, Naiping M, MD;  Location: [facility];  Service: Orthopedics;  Laterality: Right;   Social History   Occupational History  . Not on file  Tobacco Use  . Smoking status: Never Smoker  . Smokeless tobacco: Never Used  Substance and Sexual Activity  . Alcohol use: Yes    Frequency: Never    Comment: social  . Drug use: No  . Sexual activity: Not on file

## 2017-09-03 ENCOUNTER — Telehealth (INDEPENDENT_AMBULATORY_CARE_PROVIDER_SITE_OTHER): Payer: Self-pay | Admitting: Orthopaedic Surgery

## 2017-09-03 NOTE — Telephone Encounter (Signed)
Bernette RedbirdKenny Winter from [facility] would like to talk to you in regards to the bone stimulator the patient was sent.  UJ#[id number]#[phone].  Thank you.

## 2017-09-03 NOTE — Telephone Encounter (Signed)
Brenna from [facility] called asking for the patients PT orders to be faxed to [phone]

## 2017-09-04 ENCOUNTER — Telehealth (INDEPENDENT_AMBULATORY_CARE_PROVIDER_SITE_OTHER): Payer: Self-pay | Admitting: Orthopaedic Surgery

## 2017-09-04 NOTE — Telephone Encounter (Signed)
Is this okay?

## 2017-09-04 NOTE — Telephone Encounter (Signed)
yes

## 2017-09-04 NOTE — Telephone Encounter (Signed)
Matthew with [facility] is calling because they received orders for OT but the patient is already being treated for PT. Hes just calling to see if the orders will work for OT as well. Please advise # [phone]

## 2017-09-05 NOTE — Telephone Encounter (Signed)
Called Bernette RedbirdKenny Winter back from [facility] [phone] FAX [phone] States he just wanted to let us know that Bone stim should be worn for 3 hours but can break it down he states the patient is aware on how this should be done. Just wanted to let us know.  Original script for bone Stim sent to scan center. Bernette Redbird(Kenny dropped it off)

## 2017-09-05 NOTE — Telephone Encounter (Signed)
Written and faxed.  

## 2017-09-05 NOTE — Telephone Encounter (Signed)
What orders do you want me to fax to them? Be specific (WC)

## 2017-09-05 NOTE — Telephone Encounter (Signed)
Elbow and wrist ROM, strengthening, 25% PWB.

## 2017-09-05 NOTE — Telephone Encounter (Signed)
Called to advise.  

## 2017-10-09 ENCOUNTER — Encounter (INDEPENDENT_AMBULATORY_CARE_PROVIDER_SITE_OTHER): Payer: Self-pay | Admitting: Orthopaedic Surgery

## 2017-10-09 ENCOUNTER — Ambulatory Visit (INDEPENDENT_AMBULATORY_CARE_PROVIDER_SITE_OTHER): Payer: Worker's Compensation | Admitting: Orthopaedic Surgery

## 2017-10-09 ENCOUNTER — Ambulatory Visit (INDEPENDENT_AMBULATORY_CARE_PROVIDER_SITE_OTHER): Payer: Worker's Compensation

## 2017-10-09 DIAGNOSIS — S52234K Nondisplaced oblique fracture of shaft of right ulna, subsequent encounter for closed fracture with nonunion: Secondary | ICD-10-CM | POA: Diagnosis not present

## 2017-10-09 MED ORDER — TRAMADOL HCL 50 MG PO TABS: 50.0000 mg | ORAL_TABLET | Freq: Every day | ORAL | 2 refills | Status: DC | PRN

## 2017-10-09 NOTE — Progress Notes (Signed)
   Post-Op Visit Note   Patient: Charles Estes           Date of Birth: 03/14/1961           MRN: [id number] Visit Date: 10/09/2017 PCP: System, Pcp Not In   Assessment & Plan:  Chief Complaint:  Chief Complaint  Patient presents with  . Right Forearm - Pain   Visit Diagnoses:  1. Closed nondisplaced oblique fracture of shaft of right ulna with nonunion     Plan: Charles Estes is 85 days status post ORIF right ulnar shaft nonunion.  He comes in for follow-up.  He is overall doing better.  He has some stiffness and discomfort with his wrist mainly with therapy.  He denies any significant swelling or symptoms.  His x-rays do demonstrate continued healing and improved bony consolidation.  His range of motion is progressing very well.  At this point he may return to light duty with no lifting of the right arm.  Continue with physical therapy for continued strengthening and range of motion.  Follow-up in 6 weeks with 2 view x-rays of the right forearm.  Continue with bone stimulator.  Continue with calcium and vitamin D and zinc.  This was all communicated to the case manager who was present today  Follow-Up Instructions: Return in about 6 weeks (around 11/20/2017).   Orders:  Orders Placed This Encounter  Procedures  . XR Forearm Right   Meds ordered this encounter  Medications  . traMADol (ULTRAM) 50 MG tablet    Sig: Take 1-2 tablets (50-100 mg total) by mouth daily as needed.    Dispense:  30 tablet    Refill:  2    Imaging: Xr Forearm Right  Result Date: 10/09/2017 Evidence of continued bony consolidation and healing.  No complications.  Stable fixation.   PMFS History: Patient Active Problem List   Diagnosis Date Noted  . Olecranon bursitis, right elbow 06/10/2017  . 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 Depression   . GERD (gastroesophageal reflux disease)   . Hypertension   . Pathologic ulnar fracture with malunion    right    History reviewed. No pertinent family history.  Past Surgical History:  Procedure Laterality Date  . MANDIBLE FRACTURE SURGERY  2014  . ORIF ULNAR FRACTURE Right 07/16/2017   Procedure: OPEN REDUCTION INTERNAL FIXATION (ORIF) RIGHT ULNA FRACTURE NONUNION;  Surgeon: Tarry Kos, MD;  Location: [facility];  Service: Orthopedics;  Laterality: Right;   Social History   Occupational History  . Not on file  Tobacco Use  . Smoking status: Never Smoker  . Smokeless tobacco: Never Used  Substance and Sexual Activity  . Alcohol use: Yes    Frequency: Never    Comment: social  . Drug use: No  . Sexual activity: Not on file

## 2017-11-04 ENCOUNTER — Telehealth (INDEPENDENT_AMBULATORY_CARE_PROVIDER_SITE_OTHER): Payer: Self-pay | Admitting: Orthopaedic Surgery

## 2017-11-04 NOTE — Telephone Encounter (Signed)
[facility]  Charles Estes will fax authorization and approval info to office  [phone]    Contact Zella Richeriffany Collins if the office has questions per Ms.Charles Estes  Adjuster  [phone]   Pt approved for 12 sessions for Physical Therapy

## 2017-11-04 NOTE — Telephone Encounter (Signed)
FYI

## 2017-11-20 ENCOUNTER — Ambulatory Visit (INDEPENDENT_AMBULATORY_CARE_PROVIDER_SITE_OTHER): Payer: Worker's Compensation | Admitting: Orthopaedic Surgery

## 2017-11-20 ENCOUNTER — Ambulatory Visit (INDEPENDENT_AMBULATORY_CARE_PROVIDER_SITE_OTHER): Payer: Worker's Compensation

## 2017-11-20 ENCOUNTER — Encounter (INDEPENDENT_AMBULATORY_CARE_PROVIDER_SITE_OTHER): Payer: Self-pay | Admitting: Orthopaedic Surgery

## 2017-11-20 ENCOUNTER — Encounter (INDEPENDENT_AMBULATORY_CARE_PROVIDER_SITE_OTHER): Payer: Self-pay

## 2017-11-20 DIAGNOSIS — S52234K Nondisplaced oblique fracture of shaft of right ulna, subsequent encounter for closed fracture with nonunion: Secondary | ICD-10-CM | POA: Diagnosis not present

## 2017-11-20 DIAGNOSIS — M79631 Pain in right forearm: Secondary | ICD-10-CM | POA: Diagnosis not present

## 2017-11-20 NOTE — Progress Notes (Signed)
   Office Visit Note   Patient: Charles Estes           Date of Birth: [birth year]           MRN: [id number] Visit Date: 11/20/2017              Requested by: No referring provider defined for this encounter. PCP: System, Pcp Not In   Assessment & Plan: Visit Diagnoses:  1. 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 Right forearm pain     Plan: At this point patient is demonstrating good bony healing.  I would like to have him do 4 weeks of work conditioning.  He may lift up to 15 pounds at work.  Recheck in 4 weeks with 2 view x-rays of the right forearm.  Anticipate obtaining an FCE at that time. Total face to face encounter time was greater than 25 minutes and over half of this time was spent in counseling and/or coordination of care.  Follow-Up Instructions: Return in about 1 month (around 12/18/2017).   Orders:  Orders Placed This Encounter  Procedures  . XR Forearm Right   No orders of the defined types were placed in this encounter.     Procedures: No procedures performed   Clinical Data: No additional findings.   Subjective: Chief Complaint  Patient presents with  . Right Forearm - Pain, Follow-up    Charles Estes is 4 months status post ORIF of the right ulnar nonunion.  He is overall progressing and feeling better.  He has stopped using the bone stimulator.  He is progressing well with physical therapy.   Review of Systems   Objective: Vital Signs: There were no vitals taken for this visit.  Physical Exam  Ortho Exam Right forearm exam shows a fully healed surgical scar.  His range of motion of the elbow and forearm and wrist are significantly improved.  He still has some tightness that he feels in his forearm flexors when he extends his fingers fully.  His strength is improving. Specialty Comments:  No specialty comments available.  Imaging: Xr Forearm Right  Result Date: 11/20/2017 Significant improvement in  healing and bony consolidation.    PMFS History: Patient Active Problem List   Diagnosis Date Noted  . Olecranon bursitis, right elbow 06/10/2017  . 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 Depression   . GERD (gastroesophageal reflux disease)   . Hypertension   . Pathologic ulnar fracture with malunion    right    History reviewed. No pertinent family history.  Past Surgical History:  Procedure Laterality Date  . MANDIBLE FRACTURE SURGERY  2014  . ORIF ULNAR FRACTURE Right 07/16/2017   Procedure: OPEN REDUCTION INTERNAL FIXATION (ORIF) RIGHT ULNA FRACTURE NONUNION;  Surgeon: Tarry KosXu, Naiping M, MD;  Location: [facility];  Service: Orthopedics;  Laterality: Right;   Social History   Occupational History  . Not on file  Tobacco Use  . Smoking status: Never Smoker  . Smokeless tobacco: Never Used  Substance and Sexual Activity  . Alcohol use: Yes    Frequency: Never    Comment: social  . Drug use: No  . Sexual activity: Not on file

## 2017-12-18 ENCOUNTER — Encounter (INDEPENDENT_AMBULATORY_CARE_PROVIDER_SITE_OTHER): Payer: Self-pay | Admitting: Orthopaedic Surgery

## 2017-12-18 ENCOUNTER — Encounter (INDEPENDENT_AMBULATORY_CARE_PROVIDER_SITE_OTHER): Payer: Self-pay

## 2017-12-18 ENCOUNTER — Ambulatory Visit (INDEPENDENT_AMBULATORY_CARE_PROVIDER_SITE_OTHER): Payer: Worker's Compensation | Admitting: Orthopaedic Surgery

## 2017-12-18 ENCOUNTER — Ambulatory Visit (INDEPENDENT_AMBULATORY_CARE_PROVIDER_SITE_OTHER): Payer: Worker's Compensation

## 2017-12-18 DIAGNOSIS — M79631 Pain in right forearm: Secondary | ICD-10-CM

## 2017-12-18 DIAGNOSIS — S52234K Nondisplaced oblique fracture of shaft of right ulna, subsequent encounter for closed fracture with nonunion: Secondary | ICD-10-CM | POA: Diagnosis not present

## 2017-12-18 NOTE — Progress Notes (Signed)
Office Visit Note   Patient: Charles Estes           Date of Birth: 1961/03/26           MRN: [id number] Visit Date: 12/18/2017              Requested by: No referring provider defined for this encounter. PCP: System, Pcp Not In   Assessment & Plan: Visit Diagnoses:  1. 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 Right forearm pain     Plan: Impression is 57 year old gentleman with a healed ulnar nonunion.  Patient has done extensive therapy and has almost completed work conditioning.  From my standpoint I feel that he is progressing appropriately.  However patient is adamant that we obtain a nerve conduction study to rule out some sort of peripheral neuropathy which I tried to convince him that there was no evidence of.  I discussed with him that I would expect his strength to improve with time and continued exercises at home and with hand therapy.  We will order a nerve conduction study of his right upper extremity to rule out any abnormalities.  We will see him back after the studies have been completed.  If these are normal I anticipate ordering an FCE at that time.  This was all discussed in the presence of his case Production designer, theatre/television/film.  All parties were in agreement. Total face to face encounter time was greater than 25 minutes and over half of this time was spent in counseling and/or coordination of care.  Follow-Up Instructions: Return if symptoms worsen or fail to improve.   Orders:  Orders Placed This Encounter  Procedures  . XR Forearm Right   No orders of the defined types were placed in this encounter.     Procedures: No procedures performed   Clinical Data: No additional findings.   Subjective: Chief Complaint  Patient presents with  . Right Elbow - Follow-up    Patient is a 57 year old gentleman who is 5 months status post ORIF right ulna nonunion.  He states overall he is doing better.  He still complains of weakness with his grip.   He also thinks that he may have something wrong with his nerves.   Review of Systems   Objective: Vital Signs: There were no vitals taken for this visit.  Physical Exam  Ortho Exam Right upper extremity exam shows intact median, radial, ulnar nerve function both motor and sensory.  His surgical scar is fully healed.  He does have some weakness and discomfort with grip strength but I feel this is appropriate for his postsurgical recovery.  Negative carpal tunnel compressive signs. Specialty Comments:  No specialty comments available.  Imaging: No results found.   PMFS History: Patient Active Problem List   Diagnosis Date Noted  . Olecranon bursitis, right elbow 06/10/2017  . 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 Depression   . GERD (gastroesophageal reflux disease)   . Hypertension   . Pathologic ulnar fracture with malunion    right    History reviewed. No pertinent family history.  Past Surgical History:  Procedure Laterality Date  . MANDIBLE FRACTURE SURGERY  2014  . ORIF ULNAR FRACTURE Right 07/16/2017   Procedure: OPEN REDUCTION INTERNAL FIXATION (ORIF) RIGHT ULNA FRACTURE NONUNION;  Surgeon: Tarry Kos, MD;  Location: [facility];  Service: Orthopedics;  Laterality: Right;   Social History  Occupational History  . Not on file  Tobacco Use  . Smoking status: Never Smoker  . Smokeless tobacco: Never Used  Substance and Sexual Activity  . Alcohol use: Yes    Frequency: Never    Comment: social  . Drug use: No  . Sexual activity: Not on file

## 2017-12-22 ENCOUNTER — Telehealth (INDEPENDENT_AMBULATORY_CARE_PROVIDER_SITE_OTHER): Payer: Self-pay

## 2017-12-22 NOTE — Telephone Encounter (Signed)
Dr. Erin HearingLazaro would like a call back from Dr. Roda ShuttersXu concerning a need for patient to have OT for his right arm.  Cb# is [phone].  Please advise.  Thank You.

## 2017-12-22 NOTE — Telephone Encounter (Signed)
Please call. Thank you. 

## 2017-12-23 NOTE — Telephone Encounter (Signed)
See message.

## 2017-12-23 NOTE — Telephone Encounter (Signed)
See first message for phone number.

## 2017-12-23 NOTE — Telephone Encounter (Signed)
Whos' dr. Erin HearingLazaro?

## 2017-12-24 NOTE — Telephone Encounter (Signed)
Noted  

## 2017-12-24 NOTE — Telephone Encounter (Signed)
Left voice mail

## 2018-01-20 DIAGNOSIS — G5621 Lesion of ulnar nerve, right upper limb: Secondary | ICD-10-CM | POA: Insufficient documentation

## 2018-02-04 ENCOUNTER — Telehealth (INDEPENDENT_AMBULATORY_CARE_PROVIDER_SITE_OTHER): Payer: Self-pay | Admitting: Orthopaedic Surgery

## 2018-02-04 NOTE — Telephone Encounter (Signed)
Spoke with Thermon Leyland manager advised he can pickup NCS paperwork at the appointment scheduled 02/12/18 at 10:15 am

## 2018-02-04 NOTE — Telephone Encounter (Signed)
Returned call to Keturah Shavers -case manager left message on voicemail stating we have the report for NCS done in [place]. Asked that he call back to schedule follow up appointment with Dr Roda Shutters   [phone]

## 2018-02-12 ENCOUNTER — Ambulatory Visit (INDEPENDENT_AMBULATORY_CARE_PROVIDER_SITE_OTHER): Payer: Worker's Compensation | Admitting: Orthopaedic Surgery

## 2018-02-12 ENCOUNTER — Encounter (INDEPENDENT_AMBULATORY_CARE_PROVIDER_SITE_OTHER): Payer: Self-pay | Admitting: Orthopaedic Surgery

## 2018-02-12 DIAGNOSIS — M79631 Pain in right forearm: Secondary | ICD-10-CM | POA: Diagnosis not present

## 2018-02-12 DIAGNOSIS — S52234K Nondisplaced oblique fracture of shaft of right ulna, subsequent encounter for closed fracture with nonunion: Secondary | ICD-10-CM

## 2018-02-12 NOTE — Progress Notes (Signed)
   Office Visit Note   Patient: Charles Estes           Date of Birth: Sep 07, 1960           MRN: [id number] Visit Date: 02/12/2018              Requested by: No referring provider defined for this encounter. PCP: System, Pcp Not In   Assessment & Plan: Visit Diagnoses:  1. Right forearm pain   2. 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  There is no axonal loss.  Given the findings I recommend referral to hand surgeon for full evaluation and treatment recommendations.  He is to follow-up with me after evaluation so that we can perform a right and release.  For now he is to continue his same work restrictions until he sees the Hydrographic surveyorhand surgeon.  Case manager was present today to help with coordination of care. Total face to face encounter time was greater than 25 minutes and over half of this time was spent in counseling and/or coordination of care.   Follow-Up Instructions: Return if symptoms worsen or fail to improve.   Orders:  No orders of the defined types were placed in this encounter.  No orders of the defined types were placed in this encounter.     Procedures: No procedures performed   Clinical Data: No additional findings.   Subjective: Chief Complaint  Patient presents with  . Right Wrist - Pain    [name] returns today for review and follow-up of his recent nerve conduction studies.  He endorses subjective paresthesias in his ulnar nerve distribution in his right hand.  He also endorses some weakness of his hand grip strength.  He endorses some mild elbow pain.   Review of Systems   Objective: Vital Signs: There were no vitals taken for this visit.  Physical Exam  Ortho Exam Right hand exam shows no intrinsic muscle atrophy.  Negative Wartenberg.  Negative Jeannie's sign.  Positive Tinel cubital  tunnel Specialty Comments:  No specialty comments available.  Imaging: No results found.   PMFS History: Patient Active Problem List   Diagnosis Date Noted  . Olecranon bursitis, right elbow 06/10/2017  . 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 Depression   . GERD (gastroesophageal reflux disease)   . Hypertension   . Pathologic ulnar fracture with malunion    right    History reviewed. No pertinent family history.  Past Surgical History:  Procedure Laterality Date  . MANDIBLE FRACTURE SURGERY  2014  . ORIF ULNAR FRACTURE Right 07/16/2017   Procedure: OPEN REDUCTION INTERNAL FIXATION (ORIF) RIGHT ULNA FRACTURE NONUNION;  Surgeon: Tarry KosXu, Juleon Narang M, MD;  Location: [facility];  Service: Orthopedics;  Laterality: Right;   Social History   Occupational History  . Not on file  Tobacco Use  . Smoking status: Never Smoker  . Smokeless tobacco: Never Used  Substance and Sexual Activity  . Alcohol use: Yes    Frequency: Never    Comment: social  . Drug use: No  . Sexual activity: Not on file

## 2018-05-07 ENCOUNTER — Telehealth (INDEPENDENT_AMBULATORY_CARE_PROVIDER_SITE_OTHER): Payer: Self-pay

## 2018-05-07 NOTE — Telephone Encounter (Signed)
Faxed the 12/18/17 and 02/12/18 office note to case mgr per his request

## 2018-06-02 ENCOUNTER — Ambulatory Visit (INDEPENDENT_AMBULATORY_CARE_PROVIDER_SITE_OTHER): Payer: Worker's Compensation

## 2018-06-02 ENCOUNTER — Ambulatory Visit (INDEPENDENT_AMBULATORY_CARE_PROVIDER_SITE_OTHER): Payer: Worker's Compensation | Admitting: Orthopaedic Surgery

## 2018-06-02 DIAGNOSIS — M79631 Pain in right forearm: Secondary | ICD-10-CM

## 2018-06-02 MED ORDER — GABAPENTIN 100 MG PO CAPS: 100.0000 mg | ORAL_CAPSULE | Freq: Three times a day (TID) | ORAL | 3 refills | Status: DC

## 2018-06-02 NOTE — Progress Notes (Signed)
Office Visit Note   Patient: Charles Estes           Date of Birth: [birth year]           MRN: [id number] Visit Date: 06/02/2018              Requested by: No referring provider defined for this encounter. PCP: System, Pcp Not In   Assessment & Plan: Visit Diagnoses:  1. Pain of right forearm     Plan: From my standpoint the ulna fracture has completely healed and I think his issues are more related to the ulnar nerve compression.  Sounds like he is complaining a lot of nerve related pain.  I am giving him a prescription for Neurontin to see if this will help.  Again I feel that at this point his symptoms are ulnar nerve related which the hand surgeon is addressing.  Questions encouraged and answered.  Follow-up as needed. Total face to face encounter time was greater than 25 minutes and over half of this time was spent in counseling and/or coordination of care. Follow-Up Instructions: Return if symptoms worsen or fail to improve.   Orders:  Orders Placed This Encounter  Procedures  . XR Forearm Right   Meds ordered this encounter  Medications  . gabapentin (NEURONTIN) 100 MG capsule    Sig: Take 1-3 capsules (100-300 mg total) by mouth 3 (three) times daily.    Dispense:  90 capsule    Refill:  3      Procedures: No procedures performed   Clinical Data: No additional findings.   Subjective: Chief Complaint  Patient presents with  . Right Arm - Pain    Charles Estes is 11 months status post ORIF right ulna and nonunion.  He comes in today for complaints of his right forearm that throbs and swells and he states that he will wake up with a numb hand.  He states that it causes pain and weakness and numbness when he is making a fist.  He denies any recent injuries.  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  He is currently waiting on approval from [employer] to have the surgery performed by a Hydrographic surveyorhand surgeon.  He also endorses some sensitivity over the  surgical scar from the ulna fixation.   Review of Systems  Constitutional: Negative.   All other systems reviewed and are negative.    Objective: Vital Signs: There were no vitals taken for this visit.  Physical Exam Vitals signs and nursing note reviewed.  Constitutional:      Appearance: He is well-developed.  Pulmonary:     Effort: Pulmonary effort is normal.  Abdominal:     Palpations: Abdomen is soft.  Skin:    General: Skin is warm.  Neurological:     Mental Status: He is alert and oriented to person, place, and time.  Psychiatric:        Behavior: Behavior normal.        Thought Content: Thought content normal.        Judgment: Judgment normal.     Ortho Exam Right forearm exam shows a fully healed surgical scar.  This is slightly uncomfortable to palpation.  I am not able to directly palpate the plate.  Mainly his symptoms are localized to the proximal ulnar aspect of the elbow near the cubital tunnel and the flexor pronator mass.  He has discomfort with palpation squeezing this area. Specialty Comments:  No specialty comments available.  Imaging: Xr Forearm Right  Result Date: 06/02/2018 Fully healed ulna fracture with stable plate fixation    PMFS History: Patient Active Problem List   Diagnosis Date Noted  . Olecranon bursitis, right elbow 06/10/2017  . 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 Depression   . GERD (gastroesophageal reflux disease)   . Hypertension   . Pathologic ulnar fracture with malunion    right    No family history on file.  Past Surgical History:  Procedure Laterality Date  . MANDIBLE FRACTURE SURGERY  2014  . ORIF ULNAR FRACTURE Right 07/16/2017   Procedure: OPEN REDUCTION INTERNAL FIXATION (ORIF) RIGHT ULNA FRACTURE NONUNION;  Surgeon: Charles KosXu, Mariane Burpee M, MD;  Location: [facility];  Service: Orthopedics;  Laterality: Right;   Social History   Occupational History   . Not on file  Tobacco Use  . Smoking status: Never Smoker  . Smokeless tobacco: Never Used  Substance and Sexual Activity  . Alcohol use: Yes    Frequency: Never    Comment: social  . Drug use: No  . Sexual activity: Not on file

## 2018-08-27 DIAGNOSIS — K219 Gastro-esophageal reflux disease without esophagitis: Secondary | ICD-10-CM | POA: Insufficient documentation

## 2018-09-01 ENCOUNTER — Telehealth (INDEPENDENT_AMBULATORY_CARE_PROVIDER_SITE_OTHER): Payer: Self-pay

## 2018-09-01 NOTE — Telephone Encounter (Signed)
Received fax from [facility] stating pt was scheduled @ [facility] on 08/21/18 For initial eval

## 2019-06-21 DIAGNOSIS — M79631 Pain in right forearm: Secondary | ICD-10-CM | POA: Insufficient documentation

## 2019-09-14 ENCOUNTER — Ambulatory Visit: Payer: Medicare Other

## 2019-09-14 ENCOUNTER — Ambulatory Visit: Payer: Medicare Other | Attending: Internal Medicine

## 2019-09-14 DIAGNOSIS — Z23 Encounter for immunization: Secondary | ICD-10-CM

## 2019-09-14 NOTE — Progress Notes (Signed)
   Covid-19 Vaccination Clinic  Name:  Charles Estes    MRN: [id number] DOB: [birth year]  09/14/2019  Charles Estes was observed post Covid-19 immunization for 15 minutes without incident. He was provided with Vaccine Information Sheet and instruction to access the V-Safe system.   Charles Estes was instructed to call 911 with any severe reactions post vaccine: Marland Kitchen Difficulty breathing  . Swelling of face and throat  . A fast heartbeat  . A bad rash all over body  . Dizziness and weakness   Immunizations Administered    Name Date Dose VIS Date Route   Pfizer COVID-19 Vaccine 09/14/2019  8:31 AM 0.3 mL 05/14/2019 Intramuscular   Manufacturer: [employer], [employer]   Lot: [id number]   NDC: [address]-9

## 2020-05-10 ENCOUNTER — Ambulatory Visit (HOSPITAL_COMMUNITY)
Admission: EM | Admit: 2020-05-10 | Discharge: 2020-05-10 | Disposition: A | Discharge status: Home / Self Care | Payer: Medicare Other | Attending: Internal Medicine | Admitting: Internal Medicine

## 2020-05-10 ENCOUNTER — Other Ambulatory Visit: Payer: Self-pay

## 2020-05-10 ENCOUNTER — Encounter (HOSPITAL_COMMUNITY): Payer: Self-pay | Admitting: Emergency Medicine

## 2020-05-10 ENCOUNTER — Ambulatory Visit (HOSPITAL_COMMUNITY)
Admission: EM | Admit: 2020-05-10 | Discharge: 2020-05-10 | Disposition: A | Discharge status: Home / Self Care | Payer: Medicare Other | Source: Home / Self Care

## 2020-05-10 DIAGNOSIS — A5909 Other urogenital trichomoniasis: Secondary | ICD-10-CM | POA: Insufficient documentation

## 2020-05-10 DIAGNOSIS — R1030 Lower abdominal pain, unspecified: Secondary | ICD-10-CM | POA: Insufficient documentation

## 2020-05-10 DIAGNOSIS — N4889 Other specified disorders of penis: Secondary | ICD-10-CM | POA: Diagnosis not present

## 2020-05-10 LAB — POCT URINALYSIS DIPSTICK, ED / UC
Bilirubin Urine: NEGATIVE
Glucose, UA: NEGATIVE mg/dL
Hgb urine dipstick: NEGATIVE
Ketones, ur: NEGATIVE mg/dL
Leukocytes,Ua: NEGATIVE
Nitrite: NEGATIVE
Protein, ur: NEGATIVE mg/dL
Specific Gravity, Urine: 1.025 (ref 1.005–1.030)
Urobilinogen, UA: 0.2 mg/dL (ref 0.0–1.0)
pH: 5.5 (ref 5.0–8.0)

## 2020-05-10 MED ORDER — HYDROCORTISONE 1 % EX OINT: 1.0000 "application " | TOPICAL_OINTMENT | Freq: Every day | CUTANEOUS | 0 refills | Status: AC

## 2020-05-10 NOTE — ED Notes (Signed)
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 Will call pt to have him return for re-swab of HSV penis

## 2020-05-10 NOTE — ED Triage Notes (Signed)
Pt presents with lower abdominal pain, urine odor, and sore on tip of penis xs 4-5 days.

## 2020-05-10 NOTE — ED Provider Notes (Signed)
[facility]    CSN: [id number] Arrival date & time: 05/10/20  1032      History   Chief Complaint Chief Complaint  Patient presents with  . Abdominal Pain  . Groin Swelling    HPI Charles Estes is a 59 y.o. male with past medical history of GERD, hypertension presents to urgent care today with lower abdominal pain and right groin tenderness x 5 to 6 days.  Patient also reports sore on penis x6 to 7 days.  Patient describes burning with urination, no gross hematuria, no perineal or rectal pain, no difficulty starting stream. Sore on penis initially painful and burning however somewhat relieved with hydrogen peroxide.  Patient denies any penile discharge, same sexual partner x1 year.  Denies any recent fever, chills, nausea or vomiting.   Past Medical History:  Diagnosis Date  . Depression   . GERD (gastroesophageal reflux disease)   . Hypertension   . Pathologic ulnar fracture with malunion    right    Patient Active Problem List   Diagnosis Date Noted  . Olecranon bursitis, right elbow 06/10/2017  . 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 MANDIBLE FRACTURE SURGERY  2014  . ORIF ULNAR FRACTURE Right 07/16/2017   Procedure: OPEN REDUCTION INTERNAL FIXATION (ORIF) RIGHT ULNA FRACTURE NONUNION;  Surgeon: Tarry Kos, MD;  Location: [facility];  Service: Orthopedics;  Laterality: Right;      Home Medications    Prior to Admission medications   Medication Sig Start Date End Date Taking? Authorizing Provider  amLODipine (NORVASC) 10 MG tablet  04/15/17   [provider]  calcium-vitamin D (OSCAL WITH D) 500-200 MG-UNIT tablet Take 1 tablet by mouth 3 (three) times daily. Patient not taking: Reported on 11/20/2017 07/16/17   Tarry Kos, MD  FLUoxetine (PROZAC) 20 MG capsule Take by mouth. 05/05/17   [provider]  gabapentin (NEURONTIN) 100 MG capsule Take 1-3 capsules  (100-300 mg total) by mouth 3 (three) times daily. 06/02/18   Tarry Kos, MD  HYDROcodone-acetaminophen [facility]) 5-325 MG tablet 1 po q 8 hours prn pain Patient not taking: Reported on 11/20/2017 07/14/17   Tarry Kos, MD  hydrocortisone 1 % ointment Apply 1 application topically daily for 7 days. 05/10/20 05/17/20  Rolla Etienne, NP  ondansetron (ZOFRAN) 4 MG tablet Take 1-2 tablets (4-8 mg total) by mouth every 8 (eight) hours as needed for nausea or vomiting. Patient not taking: Reported on 11/20/2017 07/16/17   Tarry Kos, MD  oxyCODONE (OXY IR/ROXICODONE) 5 MG immediate release tablet Take 1-3 tablets (5-15 mg total) by mouth daily as needed. Patient not taking: Reported on 11/20/2017 07/31/17   Tarry Kos, MD  oxyCODONE (OXYCONTIN) 10 mg 12 hr tablet Take 1 tablet (10 mg total) by mouth every 12 (twelve) hours. Patient not taking: Reported on 11/20/2017 07/16/17   Tarry Kos, MD  oxyCODONE-acetaminophen (PERCOCET) 5-325 MG tablet Take 1-2 tablets by mouth every 4 (four) hours as needed for severe pain. Patient not taking: Reported on 11/20/2017 07/16/17   Tarry Kos, MD  promethazine (PHENERGAN) 25 MG tablet Take 1 tablet (25 mg total) by mouth every 6 (six) hours as needed for nausea. Patient not taking: Reported on 11/20/2017 07/16/17   Tarry Kos, MD  QUEtiapine Fumarate (SEROQUEL PO) Take 250 mg by mouth at bedtime.    [provider]  senna-docusate Mancel Parsons  S) 8.6-50 MG tablet Take 1 tablet by mouth 2 (two) times daily as needed. Patient not taking: Reported on 11/20/2017 05/20/17   Tarry Kos, MD  senna-docusate (SENOKOT S) 8.6-50 MG tablet Take 1 tablet by mouth at bedtime as needed. Patient not taking: Reported on 11/20/2017 07/16/17   Tarry Kos, MD  tiZANidine (ZANAFLEX) 4 MG tablet Take 1 tablet (4 mg total) by mouth every 6 (six) hours as needed for muscle spasms. 07/16/17   Tarry Kos, MD  traMADol (ULTRAM) 50 MG tablet Take 1-2 tablets (50-100 mg total)  by mouth daily as needed. Patient not taking: Reported on 11/20/2017 10/09/17   Tarry Kos, MD  zinc sulfate 220 (50 Zn) MG capsule Take 1 capsule (220 mg total) by mouth daily. Patient not taking: Reported on 11/20/2017 07/16/17   Tarry Kos, MD  zinc sulfate 220 (50 Zn) MG capsule Take 1 capsule (220 mg total) by mouth daily. Patient not taking: Reported on 11/20/2017 08/28/17   Tarry Kos, MD    Family History History reviewed. No pertinent family history.  Social History Social History   Tobacco Use  . Smoking status: Never Smoker  . Smokeless tobacco: Never Used  Substance Use Topics  . Alcohol use: Yes    Comment: social  . Drug use: No     Allergies   Paxil [paroxetine]   Review of Systems As stated in HPI otherwise negative  Physical Exam Triage Vital Signs ED Triage Vitals  Enc Vitals Group     BP 05/10/20 1129 (!) 131/94     Pulse Rate 05/10/20 1129 73     Resp 05/10/20 1129 18     Temp 05/10/20 1129 98.2 F (36.8 C)     Temp Source 05/10/20 1129 Oral     SpO2 05/10/20 1129 97 %     Weight --      Height --      Head Circumference --      Peak Flow --      Pain Score 05/10/20 1128 6     Pain Loc --      Pain Edu? --      Excl. in GC? --    No data found.  Updated Vital Signs BP (!) 131/94 (BP Location: Left Arm)   Pulse 73   Temp 98.2 F (36.8 C) (Oral)   Resp 18   SpO2 97%      Physical Exam Constitutional:      General: He is not in acute distress.    Appearance: He is well-developed. He is obese. He is not ill-appearing or toxic-appearing.  Abdominal:     General: Abdomen is protuberant. Bowel sounds are normal.     Palpations: Abdomen is soft. There is no hepatomegaly or mass.     Tenderness: There is abdominal tenderness in the suprapubic area. There is no right CVA tenderness, left CVA tenderness, guarding or rebound.     Hernia: No hernia is present. There is no hernia in the right inguinal area.  Genitourinary:    Testes:  Normal.     Comments: Patient not circumcised.  Small lesion/excoriation at area where foreskin meets head of penis on left side.  Slight tenderness to palpation.  No drainage Skin:    General: Skin is warm and dry.  Neurological:     Mental Status: He is alert.      UC Treatments / Results  Labs (all labs ordered are listed, but  only abnormal results are displayed) Labs Reviewed  HSV CULTURE AND TYPING  URINE CULTURE  POCT URINALYSIS DIPSTICK, ED / UC  CYTOLOGY, (ORAL, ANAL, URETHRAL) ANCILLARY ONLY    EKG   Radiology No results found.  Procedures Procedures (including critical care time)  Medications Ordered in UC Medications - No data to display  Initial Impression / Assessment and Plan / UC Course  I have reviewed the triage vital signs and the nursing notes.  Pertinent labs & imaging results that were available during my care of the patient were reviewed by me and considered in my medical decision making (see chart for details).  Suprapubic pain Sore on penis  -Unclear etiology. Pt afebrile and NT appearing. Mild tenderness on exam, but no exquisite tenderness.  -U/a unremarkable, will send for culture.  -no symptoms suggestive of prostatitis  -no evidence of hernia -Sore on penis. ?skin irritation vs STI. Will send STI testing, HSV swab as this may be causing symptoms. Hydrocortisone/Eucerin for now -pt to return or go to ED for worsening abd pain, n/v, or fever  Reviewed expections re: course of current medical issues. Questions answered. Outlined signs and symptoms indicating need for more acute intervention. Pt verbalized understanding. AVS given    Final Clinical Impressions(s) / UC Diagnoses   Final diagnoses:  Lower abdominal pain  Penile irritation     Discharge Instructions     We have sent testing for sexually transmitted infections. We will notify you of any positive results once they are received. If required, we will prescribe any  medication you might need.   Please refrain from all sexual activity for at least the next seven days. I am also prescribing lotion to use on sore. I would mix cream with Eucerin ointment before applying. Use daily x 7 days and then stop.   Please return or go to the ER for any worsening pain, nausea or vomiting.     ED Prescriptions    Medication Sig Dispense Auth. Provider   hydrocortisone 1 % ointment Apply 1 application topically daily for 7 days. 10.5 g Rolla Etienne, NP     PDMP not reviewed this encounter.   Rolla Etienne, NP 05/10/20 1247

## 2020-05-10 NOTE — Discharge Instructions (Addendum)
We have sent testing for sexually transmitted infections. We will notify you of any positive results once they are received. If required, we will prescribe any medication you might need.   Please refrain from all sexual activity for at least the next seven days. I am also prescribing lotion to use on sore. I would mix cream with Eucerin ointment before applying. Use daily x 7 days and then stop.   Please return or go to the ER for any worsening pain, nausea or vomiting.

## 2020-05-10 NOTE — ED Triage Notes (Signed)
Patient presents for reswab for HSV sample.  Will also play cytology order containing which bacteria to test for, per cytology lab.

## 2020-05-11 ENCOUNTER — Telehealth (HOSPITAL_COMMUNITY): Payer: Self-pay | Admitting: Emergency Medicine

## 2020-05-11 LAB — CYTOLOGY, (ORAL, ANAL, URETHRAL) ANCILLARY ONLY
Chlamydia: NEGATIVE
Comment: NEGATIVE
Comment: NEGATIVE
Comment: NORMAL
Neisseria Gonorrhea: NEGATIVE
Trichomonas: POSITIVE — AB

## 2020-05-11 LAB — URINE CULTURE: Culture: <10000 — AB

## 2020-05-11 MED ORDER — METRONIDAZOLE 500 MG PO TABS: 500.0000 mg | ORAL_TABLET | Freq: Two times a day (BID) | ORAL | 0 refills | Status: DC

## 2020-05-15 LAB — HSV CULTURE AND TYPING

## 2020-05-24 ENCOUNTER — Ambulatory Visit (INDEPENDENT_AMBULATORY_CARE_PROVIDER_SITE_OTHER): Payer: Medicare Other

## 2020-05-24 ENCOUNTER — Ambulatory Visit (HOSPITAL_COMMUNITY)
Admission: EM | Admit: 2020-05-24 | Discharge: 2020-05-24 | Disposition: A | Discharge status: Home / Self Care | Payer: Medicare Other | Attending: Family Medicine | Admitting: Family Medicine

## 2020-05-24 ENCOUNTER — Encounter (HOSPITAL_COMMUNITY): Payer: Self-pay

## 2020-05-24 DIAGNOSIS — R079 Chest pain, unspecified: Secondary | ICD-10-CM

## 2020-05-24 DIAGNOSIS — I10 Essential (primary) hypertension: Secondary | ICD-10-CM

## 2020-05-24 MED ORDER — LIDOCAINE VISCOUS HCL 2 % MT SOLN
OROMUCOSAL | Status: AC
  Filled 2020-05-24: qty 15

## 2020-05-24 MED ORDER — ESOMEPRAZOLE MAGNESIUM 40 MG PO CPDR: 40.0000 mg | DELAYED_RELEASE_CAPSULE | Freq: Every day | ORAL | 0 refills | Status: DC

## 2020-05-24 MED ORDER — LIDOCAINE VISCOUS HCL 2 % MT SOLN
15.0000 mL | Freq: Once | OROMUCOSAL | Status: AC
  Administered 2020-05-24: 11:00:00 15 mL via ORAL

## 2020-05-24 MED ORDER — ALUM & MAG HYDROXIDE-SIMETH 200-200-20 MG/5ML PO SUSP
30.0000 mL | Freq: Once | ORAL | Status: AC
  Administered 2020-05-24: 11:00:00 30 mL via ORAL

## 2020-05-24 MED ORDER — ALUM & MAG HYDROXIDE-SIMETH 200-200-20 MG/5ML PO SUSP
ORAL | Status: AC
  Filled 2020-05-24: qty 30
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
## 2020-05-24 NOTE — ED Provider Notes (Signed)
[facility]   [id number] 05/24/20 Arrival Time: 0934  ASSESSMENT & PLAN:  1. Chest pain, unspecified type   2. Elevated blood pressure reading in office with diagnosis of hypertension      ECG: Performed today and interpreted by me: normal EKG, normal sinus rhythm. No STEMI.  I have personally viewed the imaging studies ordered this visit. No acute cardiopulmonary changes. No pneumothorax.  Reports improvement after GI cocktail. Discussed that I cannot draw troponin here and cannot r/o a heart issue. He voices understanding.  Meds ordered this encounter  Medications  . AND Linked Order Group   . alum & mag hydroxide-simeth (MAALOX/MYLANTA) 200-200-20 MG/5ML suspension 30 mL   . lidocaine (XYLOCAINE) 2 % viscous mouth solution 15 mL  . esomeprazole (NEXIUM) 40 MG capsule    Sig: Take 1 capsule (40 mg total) by mouth daily.    Dispense:  20 capsule    Refill:  0      Discharge Instructions     You have been seen at the [facility] today for chest pain. Your evaluation today was not suggestive of any emergent condition requiring medical intervention at this time. Your ECG (heart tracing) and your chest x-ray did not show any worrisome changes. However, some medical problems make take more time to appear. Therefore, it's very important that you pay attention to any new symptoms or worsening of your current condition.  Your blood pressure was noted to be elevated during your visit today. If you are currently taking medication for high blood pressure, please ensure you are taking this as directed. If you do not have a history of high blood pressure and your blood pressure remains persistently elevated, you may need to begin taking a medication at some point. You may return here within the next few days to recheck if unable to see your primary care provider or if do not have a one.  BP (!) 129/101 (BP Location: Right Arm)   Pulse 76   Temp 98.6 F (37 C)  (Oral)   Resp 20   SpO2 97%    Please proceed directly to the Emergency Department immediately should you feel worse in any way or have any of the following symptoms: increasing or different chest pain, pain that spreads to your arm, neck, jaw, back or abdomen, shortness of breath, or nausea and vomiting.      Chest pain precautions given. Reviewed expectations re: course of current medical issues. Questions answered. Outlined signs and symptoms indicating need for more acute intervention. Patient verbalized understanding. After Visit Summary given.   SUBJECTIVE:  History from: patient. Charles Estes is a 59 y.o. male who presents with complaint of intermittent anterior/left sided chest discomfort described as dull that does not radiate. Onset gradual, first noted approx 2 d ago. Reports these symptoms are unchanged since beginning. Is belching more than usual "and not eating right". Feels this is acid related. Takes OTC acid reducer (name unknown) daily. Normal PO intake. Reports no associated n/v/diaphoresis/SOB. Injury or recent strenuous activity: none. Typical duration of symptoms when present: he is unsure; "just comes and goes". Does not wake him at night. No abd pain.. Denies: fatigue, irregular heart beat, lower extremity edema, near-syncope, orthopnea, palpitations, paroxysmal nocturnal dyspnea and syncope. Recent illnesses: none. Fever: absent. Ambulatory without assistance. Self/OTC treatment: none reported. Illicit drug use: none.  Social History   Tobacco Use  Smoking Status Never Smoker  Smokeless Tobacco Never Used   Social History  Substance and Sexual Activity  Alcohol Use Yes   Comment: social     OBJECTIVE:  Vitals:   05/24/20 0949 05/24/20 1004  BP: (!) 129/101   Pulse: 76   Resp: 20   Temp:  98.6 F (37 C)  TempSrc:  Oral  SpO2: 97%     General appearance: alert, oriented, no acute distress Eyes: PERRLA; EOMI; conjunctivae normal HENT:  normocephalic; atraumatic Neck: supple with FROM Lungs: without labored respirations; speaks full sentences without difficulty; CTAB Heart: regular rate and rhythm without murmer Chest Wall: with mild L upper tenderness to palpation Abdomen: soft, non-tender; no guarding or rebound tenderness Extremities: without edema; without calf swelling or tenderness; symmetrical without gross deformities Skin: warm and dry; without rash or lesions Neuro: normal gait Psychological: alert and cooperative; normal mood and affect  Imaging: DG Chest 2 View  Result Date: 05/24/2020 CLINICAL DATA:  Left-sided chest pain EXAM: CHEST - 2 VIEW COMPARISON:  None. FINDINGS: Borderline hyperinflation. There is no edema, consolidation, effusion, or pneumothorax. Normal heart size and mediastinal contours. IMPRESSION: No evidence of active disease. Electronically Signed   By: Marnee Spring M.D.   On: 05/24/2020 10:36     Allergies  Allergen Reactions  . Paxil [Paroxetine]     Per pt sores in mouth and diarrhea     Past Medical History:  Diagnosis Date  . Depression   . GERD (gastroesophageal reflux disease)   . Hypertension   . Pathologic ulnar fracture with malunion    right   Social History   Socioeconomic History  . Marital status: Single    Spouse name: Not on file  . Number of children: Not on file  . Years of education: Not on file  . Highest education level: Not on file  Occupational History  . Not on file  Tobacco Use  . Smoking status: Never Smoker  . Smokeless tobacco: Never Used  Substance and Sexual Activity  . Alcohol use: Yes    Comment: social  . Drug use: No  . Sexual activity: Not on file  Other Topics Concern  . Not on file  Social History Narrative  . Not on file   Social Determinants of Health   Financial Resource Strain: Not on file  Food Insecurity: Not on file  Transportation Needs: Not on file  Physical Activity: Not on file  Stress: Not on file  Social  Connections: Not on file  Intimate Partner Violence: Not on file   History reviewed. No pertinent family history. Past Surgical History:  Procedure Laterality Date  . MANDIBLE FRACTURE SURGERY  2014  . ORIF ULNAR FRACTURE Right 07/16/2017   Procedure: OPEN REDUCTION INTERNAL FIXATION (ORIF) RIGHT ULNA FRACTURE NONUNION;  Surgeon: Tarry Kos, MD;  Location: [facility];  Service: Orthopedics;  Laterality: Right;     Mardella Layman, MD 05/24/20 1112

## 2020-05-24 NOTE — ED Triage Notes (Addendum)
Pt c/o left CP radiating to back onset two nights ago described as sharp, tight, heavy in nature that increases with inspiration and re-producible on palpation. Also reports diaphoresis in the middle of the night last night while sleeping. Reports that he developed a cough and congestion two days ago that pt attributes to "getting cold when the fireplace/heat went out".   Bilateral lung sounds present, EKG performed and given to Dr. Tracie Harrier who advised pt can make the choice to further evaluated here with the understanding that [facility] cannot guarantee pt's symptoms are NOT cardiac in nature, or pt can go to ED for higher level eval. Pt chooses to be evaluated at [facility] Denies dizziness, changes in vision, n/v, pain to jaw/arm Pt reports he has been lifting his 57 year old grandaughter up frequently

## 2020-05-24 NOTE — Discharge Instructions (Addendum)
You have been seen at the [facility] today for chest pain. Your evaluation today was not suggestive of any emergent condition requiring medical intervention at this time. Your ECG (heart tracing) and your chest x-ray did not show any worrisome changes. However, some medical problems make take more time to appear. Therefore, it's very important that you pay attention to any new symptoms or worsening of your current condition.  Your blood pressure was noted to be elevated during your visit today. If you are currently taking medication for high blood pressure, please ensure you are taking this as directed. If you do not have a history of high blood pressure and your blood pressure remains persistently elevated, you may need to begin taking a medication at some point. You may return here within the next few days to recheck if unable to see your primary care provider or if do not have a one.  BP (!) 129/101 (BP Location: Right Arm)   Pulse 76   Temp 98.6 F (37 C) (Oral)   Resp 20   SpO2 97%    Please proceed directly to the Emergency Department immediately should you feel worse in any way or have any of the following symptoms: increasing or different chest pain, pain that spreads to your arm, neck, jaw, back or abdomen, shortness of breath, or nausea and vomiting.

## 2020-06-05 ENCOUNTER — Encounter (HOSPITAL_COMMUNITY): Payer: Self-pay

## 2020-06-05 ENCOUNTER — Other Ambulatory Visit: Payer: Self-pay

## 2020-06-05 ENCOUNTER — Ambulatory Visit (HOSPITAL_COMMUNITY)
Admission: EM | Admit: 2020-06-05 | Discharge: 2020-06-05 | Disposition: A | Discharge status: Home / Self Care | Payer: Medicare Other | Attending: Emergency Medicine | Admitting: Emergency Medicine

## 2020-06-05 DIAGNOSIS — S161XXA Strain of muscle, fascia and tendon at neck level, initial encounter: Secondary | ICD-10-CM

## 2020-06-05 DIAGNOSIS — S20211A Contusion of right front wall of thorax, initial encounter: Secondary | ICD-10-CM

## 2020-06-05 DIAGNOSIS — S29019A Strain of muscle and tendon of unspecified wall of thorax, initial encounter: Secondary | ICD-10-CM | POA: Diagnosis not present

## 2020-06-05 MED ORDER — METHOCARBAMOL 500 MG PO TABS: 500.0000 mg | ORAL_TABLET | Freq: Two times a day (BID) | ORAL | 0 refills | Status: DC

## 2020-06-05 MED ORDER — IBUPROFEN 600 MG PO TABS: 600.0000 mg | ORAL_TABLET | Freq: Four times a day (QID) | ORAL | 0 refills | Status: DC | PRN
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
## 2020-06-05 NOTE — Discharge Instructions (Addendum)
Take the ibuprofen 600 mg every 6 hours as needed for pain.  Take the methocarbamol 1000 mg every 6 hours as needed for muscle spasm.  Apply moist heat to your shoulders and neck for 20 minutes at a time 2-3 times a day to help relieve spasm and improve healing.  Follow the back exercises and do the stretches that we discussed twice daily.  If your symptoms not improve return for reevaluation or see your primary care provider.

## 2020-08-14 ENCOUNTER — Ambulatory Visit: Payer: Self-pay | Admitting: *Deleted

## 2020-08-14 NOTE — Telephone Encounter (Signed)
Pt reports accidentally drank 1 mouthful of Lysol that was placed in a water bottle from original container. States has been vomiting since 7-8 times; ingested Lysol 1 hour prior to call. Denies any swelling of mouth, lips, throat, no SOB. Pt connected to Poison Control '[facility]' per protocol.

## 2020-08-30 ENCOUNTER — Other Ambulatory Visit: Payer: Self-pay

## 2020-08-30 ENCOUNTER — Encounter (HOSPITAL_COMMUNITY): Payer: Self-pay

## 2020-08-30 ENCOUNTER — Ambulatory Visit (HOSPITAL_COMMUNITY)
Admission: EM | Admit: 2020-08-30 | Discharge: 2020-08-30 | Disposition: A | Discharge status: Home / Self Care | Payer: Medicare Other | Attending: Family Medicine | Admitting: Family Medicine

## 2020-08-30 DIAGNOSIS — M25552 Pain in left hip: Secondary | ICD-10-CM | POA: Diagnosis not present

## 2020-08-30 DIAGNOSIS — S70212A Abrasion, left hip, initial encounter: Secondary | ICD-10-CM | POA: Diagnosis not present

## 2020-08-30 MED ORDER — CYCLOBENZAPRINE HCL 5 MG PO TABS: 5.0000 mg | ORAL_TABLET | Freq: Three times a day (TID) | ORAL | 0 refills | Status: DC | PRN

## 2020-08-30 MED ORDER — IBUPROFEN 600 MG PO TABS: 600.0000 mg | ORAL_TABLET | Freq: Four times a day (QID) | ORAL | 0 refills | Status: DC | PRN

## 2020-08-30 NOTE — ED Triage Notes (Signed)
Pt in with c/o left leg and hip pain that happened sunday when he was loading a lawn mower onto trailer and fell  States he landed on his left side and now has a bruise on left hip  Pt has taken ibuprofen and tylenol with minimal relief

## 2020-08-30 NOTE — ED Provider Notes (Signed)
[facility]    CSN: [id number] Arrival date & time: 08/30/20  1454      History   Chief Complaint Chief Complaint  Patient presents with  . Hip Pain  . Leg Pain    HPI Charles Estes is a 60 y.o. male.   Patient here today with 3-day history of left posterior buttock and hip pain after he fell loading his lawnmower onto the trailer when the lawnmower fell off the ramp.  He states he hit this area on the pavement and caused an abrasion.  He has soreness and stiffness to the area but nothing is running down his leg, no numbness tingling weakness swelling down the leg.  Denies bowel or bladder incontinence, saddle paresthesias.  Has been putting liquid Band-Aid on the abrasion and taking over-the-counter pain relievers without relief.    Past Medical History:  Diagnosis Date  . Depression   . GERD (gastroesophageal reflux disease)   . Hypertension   . Pathologic ulnar fracture with malunion    right    Patient Active Problem List   Diagnosis Date Noted  . Olecranon bursitis, right elbow 06/10/2017  . 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 MANDIBLE FRACTURE SURGERY  2014  . ORIF ULNAR FRACTURE Right 07/16/2017   Procedure: OPEN REDUCTION INTERNAL FIXATION (ORIF) RIGHT ULNA FRACTURE NONUNION;  Surgeon: Tarry Kos, MD;  Location: [facility];  Service: Orthopedics;  Laterality: Right;       Home Medications    Prior to Admission medications   Medication Sig Start Date End Date Taking? Authorizing Provider  cyclobenzaprine (FLEXERIL) 5 MG tablet Take 1 tablet (5 mg total) by mouth 3 (three) times daily as needed for muscle spasms. DO NOT DRINK ALCOHOL OR DRIVE WHILE TAKING THIS MEDICATION 08/30/20  Yes Particia Nearing, PA-C  amLODipine (NORVASC) 10 MG tablet  04/15/17   [provider]  aspirin 81 MG chewable tablet Chew by mouth.    [provider]   calcium-vitamin D (OSCAL WITH D) 500-200 MG-UNIT tablet Take 1 tablet by mouth 3 (three) times daily. Patient not taking: Reported on 11/20/2017 07/16/17   Tarry Kos, MD  esomeprazole (NEXIUM) 40 MG capsule Take 1 capsule (40 mg total) by mouth daily. 05/24/20   Mardella Layman, MD  FLUoxetine (PROZAC) 20 MG capsule Take by mouth. 05/05/17   [provider]  gabapentin (NEURONTIN) 100 MG capsule Take 1-3 capsules (100-300 mg total) by mouth 3 (three) times daily. 06/02/18   Tarry Kos, MD  HYDROcodone-acetaminophen [facility]) 5-325 MG tablet 1 po q 8 hours prn pain Patient not taking: Reported on 11/20/2017 07/14/17   Tarry Kos, MD  ibuprofen (ADVIL) 600 MG tablet Take 1 tablet (600 mg total) by mouth every 6 (six) hours as needed. 08/30/20   Particia Nearing, PA-C  methocarbamol (ROBAXIN) 500 MG tablet Take 1 tablet (500 mg total) by mouth 2 (two) times daily. 06/05/20   Becky Augusta, NP  metroNIDAZOLE (FLAGYL) 500 MG tablet Take 1 tablet (500 mg total) by mouth 2 (two) times daily. 05/11/20   Lamptey, Britta Mccreedy, MD  ondansetron (ZOFRAN) 4 MG tablet Take 1-2 tablets (4-8 mg total) by mouth every 8 (eight) hours as needed for nausea or vomiting. Patient not taking: Reported on 11/20/2017 07/16/17   Tarry Kos, MD  oxyCODONE (OXY IR/ROXICODONE) 5 MG immediate release tablet Take 1-3 tablets (5-15 mg total)  by mouth daily as needed. Patient not taking: Reported on 11/20/2017 07/31/17   Tarry Kos, MD  oxyCODONE (OXYCONTIN) 10 mg 12 hr tablet Take 1 tablet (10 mg total) by mouth every 12 (twelve) hours. Patient not taking: Reported on 11/20/2017 07/16/17   Tarry Kos, MD  oxyCODONE-acetaminophen (PERCOCET) 5-325 MG tablet Take 1-2 tablets by mouth every 4 (four) hours as needed for severe pain. Patient not taking: Reported on 11/20/2017 07/16/17   Tarry Kos, MD  promethazine (PHENERGAN) 25 MG tablet Take 1 tablet (25 mg total) by mouth every 6 (six) hours as needed for  nausea. Patient not taking: Reported on 11/20/2017 07/16/17   Tarry Kos, MD  QUEtiapine Fumarate (SEROQUEL PO) Take 250 mg by mouth at bedtime.    [provider]  senna-docusate (SENOKOT S) 8.6-50 MG tablet Take 1 tablet by mouth 2 (two) times daily as needed. Patient not taking: Reported on 11/20/2017 05/20/17   Tarry Kos, MD  senna-docusate (SENOKOT S) 8.6-50 MG tablet Take 1 tablet by mouth at bedtime as needed. Patient not taking: Reported on 11/20/2017 07/16/17   Tarry Kos, MD  traMADol (ULTRAM) 50 MG tablet Take 1-2 tablets (50-100 mg total) by mouth daily as needed. Patient not taking: Reported on 11/20/2017 10/09/17   Tarry Kos, MD  zinc sulfate 220 (50 Zn) MG capsule Take 1 capsule (220 mg total) by mouth daily. Patient not taking: Reported on 11/20/2017 07/16/17   Tarry Kos, MD  zinc sulfate 220 (50 Zn) MG capsule Take 1 capsule (220 mg total) by mouth daily. Patient not taking: Reported on 11/20/2017 08/28/17   Tarry Kos, MD    Family History History reviewed. No pertinent family history.  Social History Social History   Tobacco Use  . Smoking status: Never Smoker  . Smokeless tobacco: Never Used  Substance Use Topics  . Alcohol use: Yes    Comment: social  . Drug use: No     Allergies   Paxil [paroxetine]   Review of Systems Review of Systems Per HPI Physical Exam Triage Vital Signs ED Triage Vitals  Enc Vitals Group     BP 08/30/20 1519 129/85     Pulse Rate 08/30/20 1519 71     Resp 08/30/20 1519 16     Temp 08/30/20 1519 98.5 F (36.9 C)     Temp src --      SpO2 08/30/20 1519 97 %     Weight --      Height --      Head Circumference --      Peak Flow --      Pain Score 08/30/20 1516 7     Pain Loc --      Pain Edu? --      Excl. in GC? --    No data found.  Updated Vital Signs BP 129/85   Pulse 71   Temp 98.5 F (36.9 C)   Resp 16   SpO2 97%   Visual Acuity Right Eye Distance:   Left Eye Distance:   Bilateral  Distance:    Right Eye Near:   Left Eye Near:    Bilateral Near:     Physical Exam Vitals and nursing note reviewed.  Constitutional:      Appearance: Normal appearance.  HENT:     Head: Atraumatic.  Eyes:     Extraocular Movements: Extraocular movements intact.     Conjunctiva/sclera: Conjunctivae normal.  Cardiovascular:  Rate and Rhythm: Normal rate and regular rhythm.  Pulmonary:     Effort: Pulmonary effort is normal.     Breath sounds: Normal breath sounds.  Musculoskeletal:        General: Tenderness and signs of injury present. No swelling or deformity. Normal range of motion.     Cervical back: Normal range of motion and neck supple.     Comments: Left posterior buttock and lateral hip tender to palpation Negative straight leg raise bilateral lower extremities Normal gait  Skin:    General: Skin is warm and dry.     Comments: Small abrasion to left posterior buttock, not erythematous or edematous and not draining  Neurological:     General: No focal deficit present.     Mental Status: He is oriented to person, place, and time.  Psychiatric:        Mood and Affect: Mood normal.        Thought Content: Thought content normal.        Judgment: Judgment normal.     UC Treatments / Results  Labs (all labs ordered are listed, but only abnormal results are displayed) Labs Reviewed - No data to display  EKG   Radiology No results found.  Procedures Procedures (including critical care time)  Medications Ordered in UC Medications - No data to display  Initial Impression / Assessment and Plan / UC Course  I have reviewed the triage vital signs and the nursing notes.  Pertinent labs & imaging results that were available during my care of the patient were reviewed by me and considered in my medical decision making (see chart for details).     Suspect contusion to the area from the fall, no evidence of bony abnormality so will defer imaging today with  shared decision making.  Discussed wound care on his small abrasion which appears to be healing well without issue.  Will give ibuprofen, Flexeril and work note.  Rest, stretches, heat to the area.  Return for acutely worsening symptoms.  Final Clinical Impressions(s) / UC Diagnoses   Final diagnoses:  Left hip pain  Abrasion of left hip, initial encounter   Discharge Instructions   None    ED Prescriptions    Medication Sig Dispense Auth. Provider   ibuprofen (ADVIL) 600 MG tablet Take 1 tablet (600 mg total) by mouth every 6 (six) hours as needed. 30 tablet Particia Nearing, [place]   cyclobenzaprine (FLEXERIL) 5 MG tablet Take 1 tablet (5 mg total) by mouth 3 (three) times daily as needed for muscle spasms. DO NOT DRINK ALCOHOL OR DRIVE WHILE TAKING THIS MEDICATION 15 tablet Particia Nearing, [place]     PDMP not reviewed this encounter.   Particia Nearing, [place] 08/30/20 1705

## 2020-09-25 DIAGNOSIS — E7800 Pure hypercholesterolemia, unspecified: Secondary | ICD-10-CM | POA: Insufficient documentation

## 2020-10-23 ENCOUNTER — Encounter (HOSPITAL_COMMUNITY): Payer: Self-pay

## 2020-10-23 ENCOUNTER — Other Ambulatory Visit: Payer: Self-pay

## 2020-10-23 ENCOUNTER — Ambulatory Visit (HOSPITAL_COMMUNITY)
Admission: EM | Admit: 2020-10-23 | Discharge: 2020-10-23 | Disposition: A | Discharge status: Home / Self Care | Payer: Medicare Other | Attending: Internal Medicine | Admitting: Internal Medicine

## 2020-10-23 DIAGNOSIS — J029 Acute pharyngitis, unspecified: Secondary | ICD-10-CM | POA: Diagnosis not present

## 2020-10-23 MED ORDER — IBUPROFEN 600 MG PO TABS: 600.0000 mg | ORAL_TABLET | Freq: Four times a day (QID) | ORAL | 0 refills | Status: DC | PRN

## 2020-10-23 MED ORDER — CEPACOL 15-2.3 MG MT LOZG: 1.0000 | LOZENGE | OROMUCOSAL | Status: DC | PRN

## 2020-10-23 MED ORDER — IPRATROPIUM BROMIDE 0.03 % NA SOLN
2.0000 | Freq: Two times a day (BID) | NASAL | 12 refills | Status: DC
Start: 2020-10-23 — End: 2022-01-18

## 2020-10-23 NOTE — Discharge Instructions (Addendum)
Increase oral fluid intake Use medications as directed If you develop any fever or chills please return to the urgent care to be reevaluated I do not think you have a COVID infection given that you are fully vaccinated and had COVID infection in February 2022.

## 2020-10-23 NOTE — ED Triage Notes (Signed)
Pt c/o a sharp chest pain X this afternoon. He states he laid down and thought the pain would go away. He states nothing helps the pain. Pt states when he takes a deep breath he feels the sharp pain. He states his right arm hurts.

## 2020-10-24 NOTE — ED Provider Notes (Signed)
[facility]    CSN: [id number] Arrival date & time: 10/23/20  1806      History   Chief Complaint Chief Complaint  Patient presents with  . Chest Pain  . Ear Pain  . Sore Throat    HPI Charles Estes is a 60 y.o. male comes to the urgent care with 1 day history of runny nose, sore throat, generalized body aches of 1 day duration.  Patient comes to the urgent care to be evaluated for that.  He denies any exposures to sick individuals. He has been fully vaccinated and recently recovered from COVID-19 infection.  No nausea or vomiting.  No diarrhea.  No fever or chills.   HPI  Past Medical History:  Diagnosis Date  . Depression   . GERD (gastroesophageal reflux disease)   . Hypertension   . Pathologic ulnar fracture with malunion    right    Patient Active Problem List   Diagnosis Date Noted  . Olecranon bursitis, right elbow 06/10/2017  . 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 MANDIBLE FRACTURE SURGERY  2014  . ORIF ULNAR FRACTURE Right 07/16/2017   Procedure: OPEN REDUCTION INTERNAL FIXATION (ORIF) RIGHT ULNA FRACTURE NONUNION;  Surgeon: Tarry Kos, MD;  Location: [facility];  Service: Orthopedics;  Laterality: Right;       Home Medications    Prior to Admission medications   Medication Sig Start Date End Date Taking? Authorizing Provider  Benzocaine-Menthol (CEPACOL) 15-2.3 MG LOZG Use as directed 1 tablet in the mouth or throat as needed. 10/23/20  Yes Jaeanna Mccomber, Britta Mccreedy, MD  ibuprofen (ADVIL) 600 MG tablet Take 1 tablet (600 mg total) by mouth every 6 (six) hours as needed. 10/23/20  Yes Leandra Vanderweele, Britta Mccreedy, MD  ipratropium (ATROVENT) 0.03 % nasal spray Place 2 sprays into both nostrils every 12 (twelve) hours. 10/23/20  Yes Leylanie Woodmansee, Britta Mccreedy, MD  amLODipine (NORVASC) 10 MG tablet  04/15/17   [provider]  aspirin 81 MG chewable tablet Chew by mouth.    [provider]  cyclobenzaprine (FLEXERIL) 5 MG tablet Take 1 tablet (5 mg total) by mouth 3 (three) times daily as needed for muscle spasms. DO NOT DRINK ALCOHOL OR DRIVE WHILE TAKING THIS MEDICATION 08/30/20   Particia Nearing, PA-C  esomeprazole (NEXIUM) 40 MG capsule Take 1 capsule (40 mg total) by mouth daily. 05/24/20   Mardella Layman, MD  FLUoxetine (PROZAC) 20 MG capsule Take by mouth. 05/05/17   [provider]  gabapentin (NEURONTIN) 100 MG capsule Take 1-3 capsules (100-300 mg total) by mouth 3 (three) times daily. 06/02/18   Tarry Kos, MD  methocarbamol (ROBAXIN) 500 MG tablet Take 1 tablet (500 mg total) by mouth 2 (two) times daily. 06/05/20   Becky Augusta, NP  metroNIDAZOLE (FLAGYL) 500 MG tablet Take 1 tablet (500 mg total) by mouth 2 (two) times daily. 05/11/20   Merrilee Jansky, MD  oxyCODONE (OXYCONTIN) 10 mg 12 hr tablet Take 1 tablet (10 mg total) by mouth every 12 (twelve) hours. Patient not taking: Reported on 11/20/2017 07/16/17   Tarry Kos, MD  QUEtiapine Fumarate (SEROQUEL PO) Take 250 mg by mouth at bedtime.    [provider]  senna-docusate (SENOKOT S) 8.6-50 MG tablet Take 1 tablet by mouth at bedtime as needed. Patient not taking: Reported on 11/20/2017 07/16/17   Tarry Kos, MD  zinc sulfate  220 (50 Zn) MG capsule Take 1 capsule (220 mg total) by mouth daily. Patient not taking: Reported on 11/20/2017 08/28/17   Tarry Kos, MD  calcium-vitamin D (OSCAL WITH D) 500-200 MG-UNIT tablet Take 1 tablet by mouth 3 (three) times daily. Patient not taking: Reported on 11/20/2017 07/16/17 10/23/20  Tarry Kos, MD  promethazine (PHENERGAN) 25 MG tablet Take 1 tablet (25 mg total) by mouth every 6 (six) hours as needed for nausea. Patient not taking: Reported on 11/20/2017 07/16/17 10/23/20  Tarry Kos, MD    Family History History reviewed. No pertinent family history.  Social History Social History   Tobacco Use  . Smoking status: Never  Smoker  . Smokeless tobacco: Never Used  Substance Use Topics  . Alcohol use: Yes    Comment: social  . Drug use: No     Allergies   Paxil [paroxetine]   Review of Systems Review of Systems  Constitutional: Negative.   HENT: Positive for congestion and sore throat.   Respiratory: Positive for cough.   Cardiovascular: Positive for chest pain.  Neurological: Negative.      Physical Exam Triage Vital Signs ED Triage Vitals  Enc Vitals Group     BP 10/23/20 1820 122/82     Pulse Rate 10/23/20 1820 73     Resp 10/23/20 1820 17     Temp 10/23/20 1820 (!) 97.2 F (36.2 C)     Temp Source 10/23/20 1820 Oral     SpO2 10/23/20 1820 98 %     Weight --      Height --      Head Circumference --      Peak Flow --      Pain Score 10/23/20 1819 10     Pain Loc --      Pain Edu? --      Excl. in GC? --    No data found.  Updated Vital Signs BP 122/82   Pulse 73   Temp (!) 97.2 F (36.2 C) (Oral)   Resp 17   SpO2 98%   Visual Acuity Right Eye Distance:   Left Eye Distance:   Bilateral Distance:    Right Eye Near:   Left Eye Near:    Bilateral Near:     Physical Exam Vitals and nursing note reviewed.  Constitutional:      General: He is not in acute distress.    Appearance: He is not ill-appearing.  Cardiovascular:     Rate and Rhythm: Normal rate and regular rhythm.     Heart sounds: Normal heart sounds.  Pulmonary:     Effort: Pulmonary effort is normal. No tachypnea or accessory muscle usage.     Breath sounds: No decreased breath sounds, wheezing or rhonchi.  Chest:     Chest wall: No mass, tenderness or crepitus.  Neurological:     Mental Status: He is alert.      UC Treatments / Results  Labs (all labs ordered are listed, but only abnormal results are displayed) Labs Reviewed - No data to display  EKG   Radiology No results found.  Procedures Procedures (including critical care time)  Medications Ordered in UC Medications - No data to  display  Initial Impression / Assessment and Plan / UC Course  I have reviewed the triage vital signs and the nursing notes.  Pertinent labs & imaging results that were available during my care of the patient were reviewed by me and considered in  my medical decision making (see chart for details).     1.  Acute viral pharyngitis: Patient recovered from COVID-19 infection in February 2022 I would not test him for COVID-19 Supportive care Increase oral fluids intake Tylenol as needed for fever and/body aches. Return to urgent care if symptoms worsen. Final Clinical Impressions(s) / UC Diagnoses   Final diagnoses:  Viral pharyngitis     Discharge Instructions     Increase oral fluid intake Use medications as directed If you develop any fever or chills please return to the urgent care to be reevaluated I do not think you have a COVID infection given that you are fully vaccinated and had COVID infection in February 2022.   ED Prescriptions    Medication Sig Dispense Auth. Provider   ibuprofen (ADVIL) 600 MG tablet Take 1 tablet (600 mg total) by mouth every 6 (six) hours as needed. 30 tablet Cathyann Kilfoyle, Britta Mccreedy, MD   Benzocaine-Menthol (CEPACOL) 15-2.3 MG LOZG Use as directed 1 tablet in the mouth or throat as needed.  Merrilee Jansky, MD   ipratropium (ATROVENT) 0.03 % nasal spray Place 2 sprays into both nostrils every 12 (twelve) hours. 30 mL Nikolaus Pienta, Britta Mccreedy, MD     PDMP not reviewed this encounter.   Merrilee Jansky, MD 10/24/20 2104

## 2020-10-25 ENCOUNTER — Telehealth (HOSPITAL_COMMUNITY): Payer: Self-pay

## 2020-10-25 NOTE — Telephone Encounter (Signed)
Telephone called returned to patient. Pt stating the medication he received is not working. Nurse referenced pt AVS stating that if he develops new sxs (chills, fever) then he should return to be re-evaluated. Nurse also advised pt that he could try OTC medication for ST such as warm salt water gargles, throat lozenges, chloraseptic spray.

## 2020-10-31 ENCOUNTER — Ambulatory Visit (HOSPITAL_COMMUNITY)
Admission: EM | Admit: 2020-10-31 | Discharge: 2020-10-31 | Disposition: A | Discharge status: Home / Self Care | Payer: Medicare Other | Attending: Medical Oncology | Admitting: Medical Oncology

## 2020-10-31 ENCOUNTER — Encounter (HOSPITAL_COMMUNITY): Payer: Self-pay | Admitting: *Deleted

## 2020-10-31 ENCOUNTER — Other Ambulatory Visit: Payer: Self-pay

## 2020-10-31 ENCOUNTER — Ambulatory Visit (INDEPENDENT_AMBULATORY_CARE_PROVIDER_SITE_OTHER): Payer: Medicare Other

## 2020-10-31 DIAGNOSIS — Z79899 Other long term (current) drug therapy: Secondary | ICD-10-CM | POA: Insufficient documentation

## 2020-10-31 DIAGNOSIS — M549 Dorsalgia, unspecified: Secondary | ICD-10-CM | POA: Insufficient documentation

## 2020-10-31 DIAGNOSIS — Z20822 Contact with and (suspected) exposure to covid-19: Secondary | ICD-10-CM | POA: Insufficient documentation

## 2020-10-31 DIAGNOSIS — R059 Cough, unspecified: Secondary | ICD-10-CM

## 2020-10-31 DIAGNOSIS — R051 Acute cough: Secondary | ICD-10-CM | POA: Diagnosis not present

## 2020-10-31 DIAGNOSIS — R058 Other specified cough: Secondary | ICD-10-CM

## 2020-10-31 DIAGNOSIS — K1379 Other lesions of oral mucosa: Secondary | ICD-10-CM | POA: Diagnosis not present

## 2020-10-31 DIAGNOSIS — J00 Acute nasopharyngitis [common cold]: Secondary | ICD-10-CM | POA: Diagnosis not present

## 2020-10-31 DIAGNOSIS — Z7982 Long term (current) use of aspirin: Secondary | ICD-10-CM | POA: Insufficient documentation

## 2020-10-31 DIAGNOSIS — R6883 Chills (without fever): Secondary | ICD-10-CM | POA: Insufficient documentation

## 2020-10-31 LAB — SARS CORONAVIRUS 2 (TAT 6-24 HRS): SARS Coronavirus 2: NEGATIVE

## 2020-10-31 MED ORDER — BENZONATATE 100 MG PO CAPS: 100.0000 mg | ORAL_CAPSULE | Freq: Three times a day (TID) | ORAL | 0 refills | Status: DC

## 2020-10-31 MED ORDER — PROMETHAZINE-DM 6.25-15 MG/5ML PO SYRP: 5.0000 mL | ORAL_SOLUTION | Freq: Four times a day (QID) | ORAL | 0 refills | Status: DC | PRN

## 2020-10-31 NOTE — ED Provider Notes (Signed)
[facility]    CSN: [id number] Arrival date & time: 10/31/20  9323      History   Chief Complaint Chief Complaint  Patient presents with  . Cough  . Sore Throat  . Chills  . sore on tongue    HPI Charles Estes is a 60 y.o. male.   HPI   Cold Symptoms: Pt reports that he has had cough, chills, sore throat and a sore on his tongue. He states that the cough, nasal congestion and drainage along with sore throat has been present for about 2 weeks. He reports being seen here a week ago and was told that he had allergy symptoms. Now has chills and productive cough where cough was dry. Some left posterior back pain with deep inspiration. No chest pain, SOB, wheezing. Not checking temp at home. No vomiting. Has tried Flonase and robitussin for symptoms.    Past Medical History:  Diagnosis Date  . Depression   . GERD (gastroesophageal reflux disease)   . Hypertension   . Pathologic ulnar fracture with malunion    right    Patient Active Problem List   Diagnosis Date Noted  . Olecranon bursitis, right elbow 06/10/2017  . 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 MANDIBLE FRACTURE SURGERY  2014  . ORIF ULNAR FRACTURE Right 07/16/2017   Procedure: OPEN REDUCTION INTERNAL FIXATION (ORIF) RIGHT ULNA FRACTURE NONUNION;  Surgeon: Tarry Kos, MD;  Location: [facility];  Service: Orthopedics;  Laterality: Right;       Home Medications    Prior to Admission medications   Medication Sig Start Date End Date Taking? Authorizing Provider  amLODipine (NORVASC) 10 MG tablet  04/15/17   [provider]  aspirin 81 MG chewable tablet Chew by mouth.    [provider]  Benzocaine-Menthol (CEPACOL) 15-2.3 MG LOZG Use as directed 1 tablet in the mouth or throat as needed. 10/23/20   Lamptey, Britta Mccreedy, MD  cyclobenzaprine (FLEXERIL) 5 MG tablet Take 1 tablet (5 mg total) by mouth 3 (three)  times daily as needed for muscle spasms. DO NOT DRINK ALCOHOL OR DRIVE WHILE TAKING THIS MEDICATION 08/30/20   Particia Nearing, PA-C  esomeprazole (NEXIUM) 40 MG capsule Take 1 capsule (40 mg total) by mouth daily. 05/24/20   Mardella Layman, MD  FLUoxetine (PROZAC) 20 MG capsule Take by mouth. 05/05/17   [provider]  gabapentin (NEURONTIN) 100 MG capsule Take 1-3 capsules (100-300 mg total) by mouth 3 (three) times daily. 06/02/18   Tarry Kos, MD  ibuprofen (ADVIL) 600 MG tablet Take 1 tablet (600 mg total) by mouth every 6 (six) hours as needed. 10/23/20   Merrilee Jansky, MD  ipratropium (ATROVENT) 0.03 % nasal spray Place 2 sprays into both nostrils every 12 (twelve) hours. 10/23/20   Lamptey, Britta Mccreedy, MD  methocarbamol (ROBAXIN) 500 MG tablet Take 1 tablet (500 mg total) by mouth 2 (two) times daily. 06/05/20   Becky Augusta, NP  metroNIDAZOLE (FLAGYL) 500 MG tablet Take 1 tablet (500 mg total) by mouth 2 (two) times daily. 05/11/20   Merrilee Jansky, MD  oxyCODONE (OXYCONTIN) 10 mg 12 hr tablet Take 1 tablet (10 mg total) by mouth every 12 (twelve) hours. Patient not taking: Reported on 11/20/2017 07/16/17   Tarry Kos, MD  QUEtiapine Fumarate (SEROQUEL PO) Take 250 mg by mouth at bedtime.    [provider]  senna-docusate (SENOKOT S) 8.6-50 MG tablet Take 1 tablet by mouth at bedtime as needed. Patient not taking: Reported on 11/20/2017 07/16/17   Tarry Kos, MD  zinc sulfate 220 (50 Zn) MG capsule Take 1 capsule (220 mg total) by mouth daily. Patient not taking: Reported on 11/20/2017 08/28/17   Tarry Kos, MD  calcium-vitamin D (OSCAL WITH D) 500-200 MG-UNIT tablet Take 1 tablet by mouth 3 (three) times daily. Patient not taking: Reported on 11/20/2017 07/16/17 10/23/20  Tarry Kos, MD  promethazine (PHENERGAN) 25 MG tablet Take 1 tablet (25 mg total) by mouth every 6 (six) hours as needed for nausea. Patient not taking: Reported on 11/20/2017 07/16/17 10/23/20   Tarry Kos, MD    Family History History reviewed. No pertinent family history.  Social History Social History   Tobacco Use  . Smoking status: Never Smoker  . Smokeless tobacco: Never Used  Substance Use Topics  . Alcohol use: Yes    Comment: social  . Drug use: No     Allergies   Paxil [paroxetine]   Review of Systems Review of Systems  As stated above in HPI Physical Exam Triage Vital Signs ED Triage Vitals  Enc Vitals Group     BP 10/31/20 0937 112/89     Pulse Rate 10/31/20 0937 87     Resp 10/31/20 0937 20     Temp 10/31/20 0937 98.9 F (37.2 C)     Temp Source 10/31/20 0937 Oral     SpO2 10/31/20 0937 98 %     Weight --      Height --      Head Circumference --      Peak Flow --      Pain Score 10/31/20 0934 8     Pain Loc --      Pain Edu? --      Excl. in GC? --    No data found.  Updated Vital Signs BP 112/89   Pulse 87   Temp 98.9 F (37.2 C) (Oral)   Resp 20   SpO2 98%    Physical Exam Vitals and nursing note reviewed.  Constitutional:      General: He is not in acute distress.    Appearance: He is well-developed. He is not ill-appearing, toxic-appearing or diaphoretic.  HENT:     Head: Normocephalic and atraumatic.     Right Ear: Tympanic membrane and ear canal normal. No tenderness. No middle ear effusion. Tympanic membrane is not erythematous.     Left Ear: Tympanic membrane and ear canal normal. No tenderness.  No middle ear effusion. Tympanic membrane is not erythematous.     Nose: Congestion and rhinorrhea (mild clear) present.     Mouth/Throat:     Mouth: Mucous membranes are moist. No oral lesions.     Pharynx: Oropharynx is clear. Uvula midline. No pharyngeal swelling, oropharyngeal exudate, posterior oropharyngeal erythema or uvula swelling.     Tonsils: No tonsillar exudate or tonsillar abscesses.  Eyes:     Conjunctiva/sclera: Conjunctivae normal.     Pupils: Pupils are equal, round, and reactive to light.   Cardiovascular:     Rate and Rhythm: Normal rate and regular rhythm.     Heart sounds: Normal heart sounds.  Pulmonary:     Effort: Pulmonary effort is normal.     Breath sounds: Normal breath sounds.  Musculoskeletal:     Cervical back: Normal range of motion and neck supple.  Lymphadenopathy:  Cervical: Cervical adenopathy present.  Skin:    General: Skin is warm.  Neurological:     Mental Status: He is alert and oriented to person, place, and time.      UC Treatments / Results  Labs (all labs ordered are listed, but only abnormal results are displayed) Labs Reviewed  SARS CORONAVIRUS 2 (TAT 6-24 HRS)    EKG   Radiology No results found.  Procedures Procedures (including critical care time)  Medications Ordered in UC Medications - No data to display  Initial Impression / Assessment and Plan / UC Course  I have reviewed the triage vital signs and the nursing notes.  Pertinent labs & imaging results that were available during my care of the patient were reviewed by me and considered in my medical decision making (see chart for details).     New. Chest x ray pending given length of illness and productive cough with chills. Will send in tessalon and cough syrup per pt request to go along with his flonase. Work note given per pt request. Discussed reds flag signs and symptoms.    Final Clinical Impressions(s) / UC Diagnoses   Final diagnoses:  None   Discharge Instructions   None    ED Prescriptions    None     PDMP not reviewed this encounter.   Rushie Chestnut, [place] 10/31/20 1107

## 2020-10-31 NOTE — ED Triage Notes (Signed)
Pt reports ongoing cough ,chills ,sore throat and a new sore on his tongue.

## 2020-11-11 ENCOUNTER — Other Ambulatory Visit: Payer: Self-pay

## 2020-11-11 ENCOUNTER — Ambulatory Visit (HOSPITAL_COMMUNITY)
Admission: EM | Admit: 2020-11-11 | Discharge: 2020-11-11 | Disposition: A | Discharge status: Home / Self Care | Payer: Medicare Other | Attending: Emergency Medicine | Admitting: Emergency Medicine

## 2020-11-11 ENCOUNTER — Encounter (HOSPITAL_COMMUNITY): Payer: Self-pay

## 2020-11-11 DIAGNOSIS — R300 Dysuria: Secondary | ICD-10-CM | POA: Insufficient documentation

## 2020-11-11 DIAGNOSIS — R059 Cough, unspecified: Secondary | ICD-10-CM | POA: Insufficient documentation

## 2020-11-11 DIAGNOSIS — R350 Frequency of micturition: Secondary | ICD-10-CM | POA: Diagnosis present

## 2020-11-11 LAB — POCT URINALYSIS DIPSTICK, ED / UC
Bilirubin Urine: NEGATIVE
Glucose, UA: NEGATIVE mg/dL
Hgb urine dipstick: NEGATIVE
Ketones, ur: NEGATIVE mg/dL
Leukocytes,Ua: NEGATIVE
Nitrite: NEGATIVE
Protein, ur: NEGATIVE mg/dL
Specific Gravity, Urine: 1.02 (ref 1.005–1.030)
Urobilinogen, UA: 0.2 mg/dL (ref 0.0–1.0)
pH: 7 (ref 5.0–8.0)

## 2020-11-11 NOTE — ED Provider Notes (Signed)
[facility]    CSN: [id number] Arrival date & time: 11/11/20  1003      History   Chief Complaint Chief Complaint  Patient presents with   Abdominal Pain   Cough   Urinary Frequency    HPI Charles Estes is a 60 y.o. male.   Patient here evaluation of lower abdominal pain and dysuria that has been ongoing for the past 4 days.  Also reports having some urinary frequency.  Patient does report possible exposure to STIs.  Also complaining of a cough that has been ongoing for the last 4 days.  Reports having cough intermittently for the past several months and states that he will use cough syrup and symptoms will improve but then returned.  Denies any history of asthma or COPD.  Does report getting a pneumonia shot.  Denies any trauma, injury, or other precipitating event.  Denies any specific alleviating or aggravating factors.  Denies any fevers, chest pain, N/V/D, numbness, tingling, weakness, or headaches.     The history is provided by the patient.  Abdominal Pain Associated symptoms: cough, dysuria and sore throat   Cough Associated symptoms: sore throat   Urinary Frequency Associated symptoms include abdominal pain.   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 Authorizing  Provider  amLODipine (NORVASC) 10 MG tablet  04/15/17   [provider]  aspirin 81 MG chewable tablet Chew by mouth.    [provider]  Benzocaine-Menthol (CEPACOL) 15-2.3 MG LOZG Use as directed 1 tablet in the mouth or throat as needed. 10/23/20   Lamptey, Britta Mccreedy, MD  benzonatate (TESSALON) 100 MG capsule Take 1 capsule (100 mg total) by mouth every 8 (eight) hours. 10/31/20   Rushie Chestnut, PA-C  cyclobenzaprine (FLEXERIL) 5 MG tablet Take 1 tablet (5 mg total) by mouth 3 (three) times daily as needed for muscle spasms. DO NOT DRINK ALCOHOL OR DRIVE WHILE TAKING THIS MEDICATION 08/30/20   Particia Nearing, PA-C  esomeprazole (NEXIUM) 40 MG capsule Take 1 capsule (40 mg total) by mouth daily. 05/24/20   Mardella Layman, MD  FLUoxetine (PROZAC) 20 MG capsule Take by mouth. 05/05/17   [provider]  gabapentin (NEURONTIN) 100 MG capsule Take 1-3 capsules (100-300 mg total) by mouth 3 (three) times daily. 06/02/18   Tarry Kos, MD  ibuprofen (ADVIL) 600 MG tablet Take 1 tablet (600 mg total) by mouth every 6 (six) hours as needed. 10/23/20   Merrilee Jansky, MD  ipratropium (ATROVENT) 0.03 % nasal spray Place 2 sprays into both nostrils every 12 (twelve) hours. 10/23/20   Lamptey, Britta Mccreedy, MD  methocarbamol (ROBAXIN) 500 MG tablet Take 1 tablet (500 mg total) by mouth 2 (two) times daily. 06/05/20  Becky Augustayan, Jeremy, NP  metroNIDAZOLE (FLAGYL) 500 MG tablet Take 1 tablet (500 mg total) by mouth 2 (two) times daily. 05/11/20   Merrilee JanskyLamptey, Philip O, MD  oxyCODONE (OXYCONTIN) 10 mg 12 hr tablet Take 1 tablet (10 mg total) by mouth every 12 (twelve) hours. Patient not taking: Reported on 11/20/2017 07/16/17   Tarry KosXu, Naiping M, MD  promethazine-dextromethorphan (PROMETHAZINE-DM) 6.25-15 MG/5ML syrup Take 5 mLs by mouth 4 (four) times daily as needed for cough. 10/31/20   Rushie Chestnutovington, Sarah M, PA-C  QUEtiapine Fumarate (SEROQUEL PO) Take 250 mg by mouth at bedtime.    [provider]  senna-docusate (SENOKOT S) 8.6-50 MG tablet Take 1 tablet by mouth at bedtime as needed. Patient not taking: Reported on 11/20/2017 07/16/17   Tarry KosXu, Naiping M, MD  zinc sulfate 220 (50 Zn) MG capsule Take 1 capsule (220 mg total) by mouth daily. Patient not taking: Reported on 11/20/2017 08/28/17   Tarry KosXu, Naiping M, MD  calcium-vitamin D (OSCAL WITH D) 500-200 MG-UNIT tablet Take 1 tablet by mouth 3 (three) times daily. Patient not taking: Reported on 11/20/2017 07/16/17 10/23/20  Tarry KosXu, Naiping M, MD  promethazine (PHENERGAN) 25 MG tablet Take 1 tablet (25 mg total) by mouth every 6 (six) hours as needed for nausea. Patient not taking: Reported on 11/20/2017 07/16/17 10/23/20  Tarry KosXu, Naiping M, MD    Family History History reviewed. No pertinent family history.  Social History Social History   Tobacco Use   Smoking status: Never   Smokeless tobacco: Never  Substance Use Topics   Alcohol use: Yes    Comment: social   Drug use: No     Allergies   Paxil [paroxetine]   Review of Systems Review of Systems  HENT:  Positive for congestion and sore throat.   Respiratory:  Positive for cough.   Gastrointestinal:  Positive for abdominal pain.  Genitourinary:  Positive for dysuria and frequency.  All other systems reviewed and are negative.   Physical Exam Triage Vital Signs ED Triage Vitals  Enc Vitals Group     BP 11/11/20 1025 (!) 146/101     Pulse Rate 11/11/20 1025 75     Resp 11/11/20 1025 18     Temp 11/11/20 1025 98.5 F (36.9 C)     Temp Source 11/11/20 1025 Oral     SpO2 11/11/20 1025 96 %     Weight --      Height --      Head Circumference --      Peak Flow --      Pain Score 11/11/20 1024 5     Pain Loc --      Pain Edu? --      Excl. in GC? --    No data found.  Updated Vital Signs BP (!) 146/101 (BP Location: Left Arm)   Pulse 75   Temp 98.5 F (36.9 C) (Oral)   Resp 18   SpO2 96%   Visual Acuity Right Eye Distance:   Left Eye Distance:    Bilateral Distance:    Right Eye Near:   Left Eye Near:    Bilateral Near:     Physical Exam Vitals and nursing note reviewed.  Constitutional:      General: He is not in acute distress.    Appearance: Normal appearance. He is not ill-appearing, toxic-appearing or diaphoretic.  HENT:     Head: Normocephalic and atraumatic.     Mouth/Throat:     Pharynx: Oropharynx is clear.  Uvula midline. No oropharyngeal exudate, posterior oropharyngeal erythema or uvula swelling.     Tonsils: No tonsillar exudate or tonsillar abscesses.  Eyes:     Conjunctiva/sclera: Conjunctivae normal.  Cardiovascular:     Rate and Rhythm: Normal rate.     Pulses: Normal pulses.     Heart sounds: Normal heart sounds.  Pulmonary:     Effort: Pulmonary effort is normal.     Breath sounds: Normal breath sounds.  Abdominal:     General: Abdomen is flat.     Palpations: Abdomen is soft.     Tenderness: There is no abdominal tenderness.  Genitourinary:    Comments: declines Musculoskeletal:        General: Normal range of motion.     Cervical back: Normal range of motion.  Skin:    General: Skin is warm and dry.  Neurological:     General: No focal deficit present.     Mental Status: He is alert and oriented to person, place, and time.  Psychiatric:        Mood and Affect: Mood normal.     UC Treatments / Results  Labs (all labs ordered are listed, but only abnormal results are displayed) Labs Reviewed  POCT URINALYSIS DIPSTICK, ED / UC  CYTOLOGY, (ORAL, ANAL, URETHRAL) ANCILLARY ONLY    EKG   Radiology No results found.  Procedures Procedures (including critical care time)  Medications Ordered in UC Medications - No data to display  Initial Impression / Assessment and Plan / UC Course  I have reviewed the triage vital signs and the nursing notes.  Pertinent labs & imaging results that were available during my care of the patient were reviewed by me and considered in my medical  decision making (see chart for details).    Assessment negative for red flags or concerns.  Urinalysis negative for any signs of urinary tract infection.  Self swab obtained and will treat based on results.  Discussed safe sex practices including condom or other barrier method use.  For cough May continue to take cough syrup as needed for symptom relief.  Consider taking a daily allergy medication such as Claritin or Zyrtec.  Also recommend starting Flonase 1 to 2 sprays in each nostril daily.  Encourage fluids and rest.  Follow-up with primary care as needed.  Final Clinical Impressions(s) / UC Diagnoses   Final diagnoses:  Dysuria  Urinary frequency  Cough     Discharge Instructions      You can continue to take cough syrup as needed for cough.   Start taking an daily allergy medication, such as Claritin or Zyrtec.  You can also start using Flonase nasal spray, 1-2 sprays in each nostril daily.    You can take Tylenol and/or Ibuprofen as needed for fever reduction and pain relief.   For cough: honey 1/2 to 1 teaspoon (you can dilute the honey in water or another fluid).  You can also use guaifenesin and dextromethorphan for cough. You can use a humidifier for chest congestion and cough.  If you don't have a humidifier, you can sit in the bathroom with the hot shower running.     For sore throat: try warm salt water gargles, cepacol lozenges, throat spray, warm tea or water with lemon/honey, popsicles or ice, or OTC cold relief medicine for throat discomfort.    For congestion: take a daily anti-histamine like Zyrtec, Claritin, and a oral decongestant, such as pseudoephedrine.  You can also use Flonase 1-2  sprays in each nostril daily.    It is important to stay hydrated: drink plenty of fluids (water, gatorade/powerade/pedialyte, juices, or teas) to keep your throat moisturized and help further relieve irritation/discomfort.   We will contact you with the results from your lab work and  any additional treatment.    Do not have sex while taking undergoing treatment for STI.  Make sure that all of your partners get tested and treated.   Use a condom or other barrier method for all sexual encounters.    Return or go to the Emergency Department if symptoms worsen or do not improve in the next few days.      ED Prescriptions   None    PDMP not reviewed this encounter.   Ivette Loyal, NP 11/11/20 1116

## 2020-11-11 NOTE — ED Triage Notes (Signed)
Pt in with c/o cough x 4 days. Pt also c/o lower abdominal pain and burning during urination x 4 days  Pt also c/o urinary frequency

## 2020-11-11 NOTE — Discharge Instructions (Addendum)
You can continue to take cough syrup as needed for cough.   Start taking an daily allergy medication, such as Claritin or Zyrtec.  You can also start using Flonase nasal spray, 1-2 sprays in each nostril daily.    You can take Tylenol and/or Ibuprofen as needed for fever reduction and pain relief.   For cough: honey 1/2 to 1 teaspoon (you can dilute the honey in water or another fluid).  You can also use guaifenesin and dextromethorphan for cough. You can use a humidifier for chest congestion and cough.  If you don't have a humidifier, you can sit in the bathroom with the hot shower running.     For sore throat: try warm salt water gargles, cepacol lozenges, throat spray, warm tea or water with lemon/honey, popsicles or ice, or OTC cold relief medicine for throat discomfort.    For congestion: take a daily anti-histamine like Zyrtec, Claritin, and a oral decongestant, such as pseudoephedrine.  You can also use Flonase 1-2 sprays in each nostril daily.    It is important to stay hydrated: drink plenty of fluids (water, gatorade/powerade/pedialyte, juices, or teas) to keep your throat moisturized and help further relieve irritation/discomfort.   We will contact you with the results from your lab work and any additional treatment.    Do not have sex while taking undergoing treatment for STI.  Make sure that all of your partners get tested and treated.   Use a condom or other barrier method for all sexual encounters.    Return or go to the Emergency Department if symptoms worsen or do not improve in the next few days.

## 2020-11-13 LAB — CYTOLOGY, (ORAL, ANAL, URETHRAL) ANCILLARY ONLY
Chlamydia: NEGATIVE
Comment: NEGATIVE
Comment: NEGATIVE
Comment: NORMAL
Neisseria Gonorrhea: NEGATIVE
Trichomonas: NEGATIVE

## 2020-12-11 ENCOUNTER — Ambulatory Visit (HOSPITAL_COMMUNITY)
Admission: EM | Admit: 2020-12-11 | Discharge: 2020-12-11 | Disposition: A | Discharge status: Home / Self Care | Payer: Medicare Other | Attending: Emergency Medicine | Admitting: Emergency Medicine

## 2020-12-11 ENCOUNTER — Encounter (HOSPITAL_COMMUNITY): Payer: Self-pay

## 2020-12-11 DIAGNOSIS — M7989 Other specified soft tissue disorders: Secondary | ICD-10-CM | POA: Diagnosis not present

## 2020-12-11 DIAGNOSIS — R0602 Shortness of breath: Secondary | ICD-10-CM | POA: Diagnosis not present

## 2020-12-11 DIAGNOSIS — H538 Other visual disturbances: Secondary | ICD-10-CM | POA: Diagnosis not present

## 2020-12-11 NOTE — ED Notes (Addendum)
Patient is being discharged from the Urgent Care and sent to the Emergency Department (walked to ED with RN). Per Salli Quarry NP, patient is in need of higher level of care due to chest pain, shob, dizziness, blurred vision. Patient is aware and verbalizes understanding of plan of care.   Called report to ED charge RN. Vitals:   12/11/20 1632  BP: 103/71  Pulse: 84  Resp: 18  Temp: 98.1 F (36.7 C)  SpO2: 95%

## 2020-12-11 NOTE — ED Provider Notes (Signed)
[facility]    CSN: [id number] Arrival date & time: 12/11/20  1613      History   Chief Complaint Chief Complaint  Patient presents with   Chest Pain   Back Pain   Leg Swelling   Dizziness   Blurred Vision    HPI Charles Estes is a 60 y.o. male.   Patient presents with  new onset blurred vision, shortness of breath and bilateral leg swelling beginning last night, worse upon awakening, states" I would have came earlier but I couldn't see to drive here". Denies chest pain or tightness, wheezing, palpitations, tachycardia.   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 Authorizing Provider  amLODipine (NORVASC) 10 MG tablet  04/15/17   [provider]  aspirin 81 MG chewable tablet Chew by mouth.    [provider]  Benzocaine-Menthol (CEPACOL) 15-2.3 MG LOZG Use as directed 1 tablet in the mouth or throat as needed. 10/23/20   Lamptey, Britta Mccreedy, MD  benzonatate (TESSALON) 100 MG capsule Take 1 capsule (100 mg total) by mouth every 8 (eight) hours. 10/31/20   Rushie Chestnut, PA-C  cyclobenzaprine (FLEXERIL) 5 MG tablet Take 1 tablet (5 mg total) by mouth 3 (three) times daily as needed for muscle spasms. DO NOT DRINK ALCOHOL OR DRIVE WHILE TAKING THIS MEDICATION  08/30/20   Particia Nearing, PA-C  esomeprazole (NEXIUM) 40 MG capsule Take 1 capsule (40 mg total) by mouth daily. 05/24/20   Mardella Layman, MD  FLUoxetine (PROZAC) 20 MG capsule Take by mouth. 05/05/17   [provider]  gabapentin (NEURONTIN) 100 MG capsule Take 1-3 capsules (100-300 mg total) by mouth 3 (three) times daily. 06/02/18   Tarry Kos, MD  ibuprofen (ADVIL) 600 MG tablet Take 1 tablet (600 mg total) by mouth every 6 (six) hours as needed. 10/23/20   Merrilee Jansky, MD  ipratropium (ATROVENT) 0.03 % nasal spray Place 2 sprays into both nostrils every 12 (twelve) hours. 10/23/20   Lamptey, Britta Mccreedy, MD  methocarbamol (ROBAXIN) 500 MG tablet Take 1 tablet (500 mg total) by mouth 2 (two) times daily. 06/05/20   Becky Augusta, NP  metroNIDAZOLE (FLAGYL) 500 MG tablet Take 1 tablet (500 mg total) by mouth 2 (two) times daily. 05/11/20   Merrilee Jansky, MD  oxyCODONE (OXYCONTIN) 10 mg 12 hr tablet Take 1 tablet (10 mg total) by mouth every 12 (twelve) hours. Patient not taking: Reported on 11/20/2017 07/16/17   Tarry Kos, MD  promethazine-dextromethorphan (PROMETHAZINE-DM) 6.25-15 MG/5ML syrup Take 5 mLs by mouth 4 (four) times daily as needed for cough. 10/31/20   Rushie Chestnut, PA-C  QUEtiapine Fumarate (SEROQUEL PO) Take 250 mg by mouth at bedtime.  [provider]  senna-docusate (SENOKOT S) 8.6-50 MG tablet Take 1 tablet by mouth at bedtime as needed. Patient not taking: Reported on 11/20/2017 07/16/17   Tarry Kos, MD  zinc sulfate 220 (50 Zn) MG capsule Take 1 capsule (220 mg total) by mouth daily. Patient not taking: Reported on 11/20/2017 08/28/17   Tarry Kos, MD  calcium-vitamin D (OSCAL WITH D) 500-200 MG-UNIT tablet Take 1 tablet by mouth 3 (three) times daily. Patient not taking: Reported on 11/20/2017 07/16/17 10/23/20  Tarry Kos, MD  promethazine (PHENERGAN) 25 MG tablet Take 1 tablet (25 mg total) by mouth every 6 (six) hours as needed  for nausea. Patient not taking: Reported on 11/20/2017 07/16/17 10/23/20  Tarry Kos, MD    Family History Family History  Problem Relation Age of Onset   Hypertension Mother    Heart attack Mother    Hypertension Father    Heart attack Father    Heart attack Sister    Hypertension Sister    Heart attack Sister    Hypertension Brother     Social History Social History   Tobacco Use   Smoking status: Some Days    Pack years: 0.00    Types: Cigarettes   Smokeless tobacco: Never  Substance Use Topics   Alcohol use: Yes    Comment: socially- past weekend reports 10 beers   Drug use: Yes    Types: Cocaine    Comment: occasionally     Allergies   Paxil [paroxetine]   Review of Systems Review of Systems   Physical Exam Triage Vital Signs ED Triage Vitals  Enc Vitals Group     BP 12/11/20 1632 103/71     Pulse Rate 12/11/20 1632 84     Resp 12/11/20 1632 18     Temp 12/11/20 1632 98.1 F (36.7 C)     Temp src --      SpO2 12/11/20 1632 95 %     Weight --      Height --      Head Circumference --      Peak Flow --      Pain Score 12/11/20 1631 8     Pain Loc --      Pain Edu? --      Excl. in GC? --    No data found.  Updated Vital Signs BP 103/71   Pulse 84   Temp 98.1 F (36.7 C)   Resp 18   SpO2 95%   Visual Acuity Right Eye Distance:   Left Eye Distance:   Bilateral Distance:    Right Eye Near:   Left Eye Near:    Bilateral Near:     Physical Exam   UC Treatments / Results  Labs (all labs ordered are listed, but only abnormal results are displayed) Labs Reviewed - No data to display  EKG   Radiology No results found.  Procedures Procedures (including critical care time)  Medications Ordered in UC Medications - No data to display  Initial Impression / Assessment and Plan / UC Course  I have reviewed the triage vital signs and the nursing notes.  Pertinent labs & imaging results that were available during my care of the  patient were reviewed by me and considered in my medical decision making (see chart for details).  Leg swelling Blurred vision bilateral  Shortness of breath   EKG normal sinus rhythm, VSS, to escort self by foot, advised to go to  emergency department for evaluation to determine further cardiac involvement  Final Clinical Impressions(s) / UC Diagnoses   Final diagnoses:  Leg swelling  Blurred vision, bilateral  Shortness of breath   Discharge Instructions   None    ED Prescriptions   None    PDMP not reviewed this encounter.   Valinda Hoar, NP 12/11/20 1657

## 2020-12-11 NOTE — ED Triage Notes (Signed)
C/o central chest pain since 5am today which pt rates 8/10, reports shob, dizziness/blurred vision since 5am. Pt also reports legs were swelling this morning, and reports back pain. Reports history of htn.

## 2020-12-18 DIAGNOSIS — R12 Heartburn: Secondary | ICD-10-CM | POA: Insufficient documentation

## 2021-09-21 IMAGING — DX DG CHEST 2V
2 series · 2 of 2 positions shown · non-contrast
Comparison: None.

CLINICAL DATA: Left-sided chest pain

EXAM:
CHEST - 2 VIEW

[chest pa]
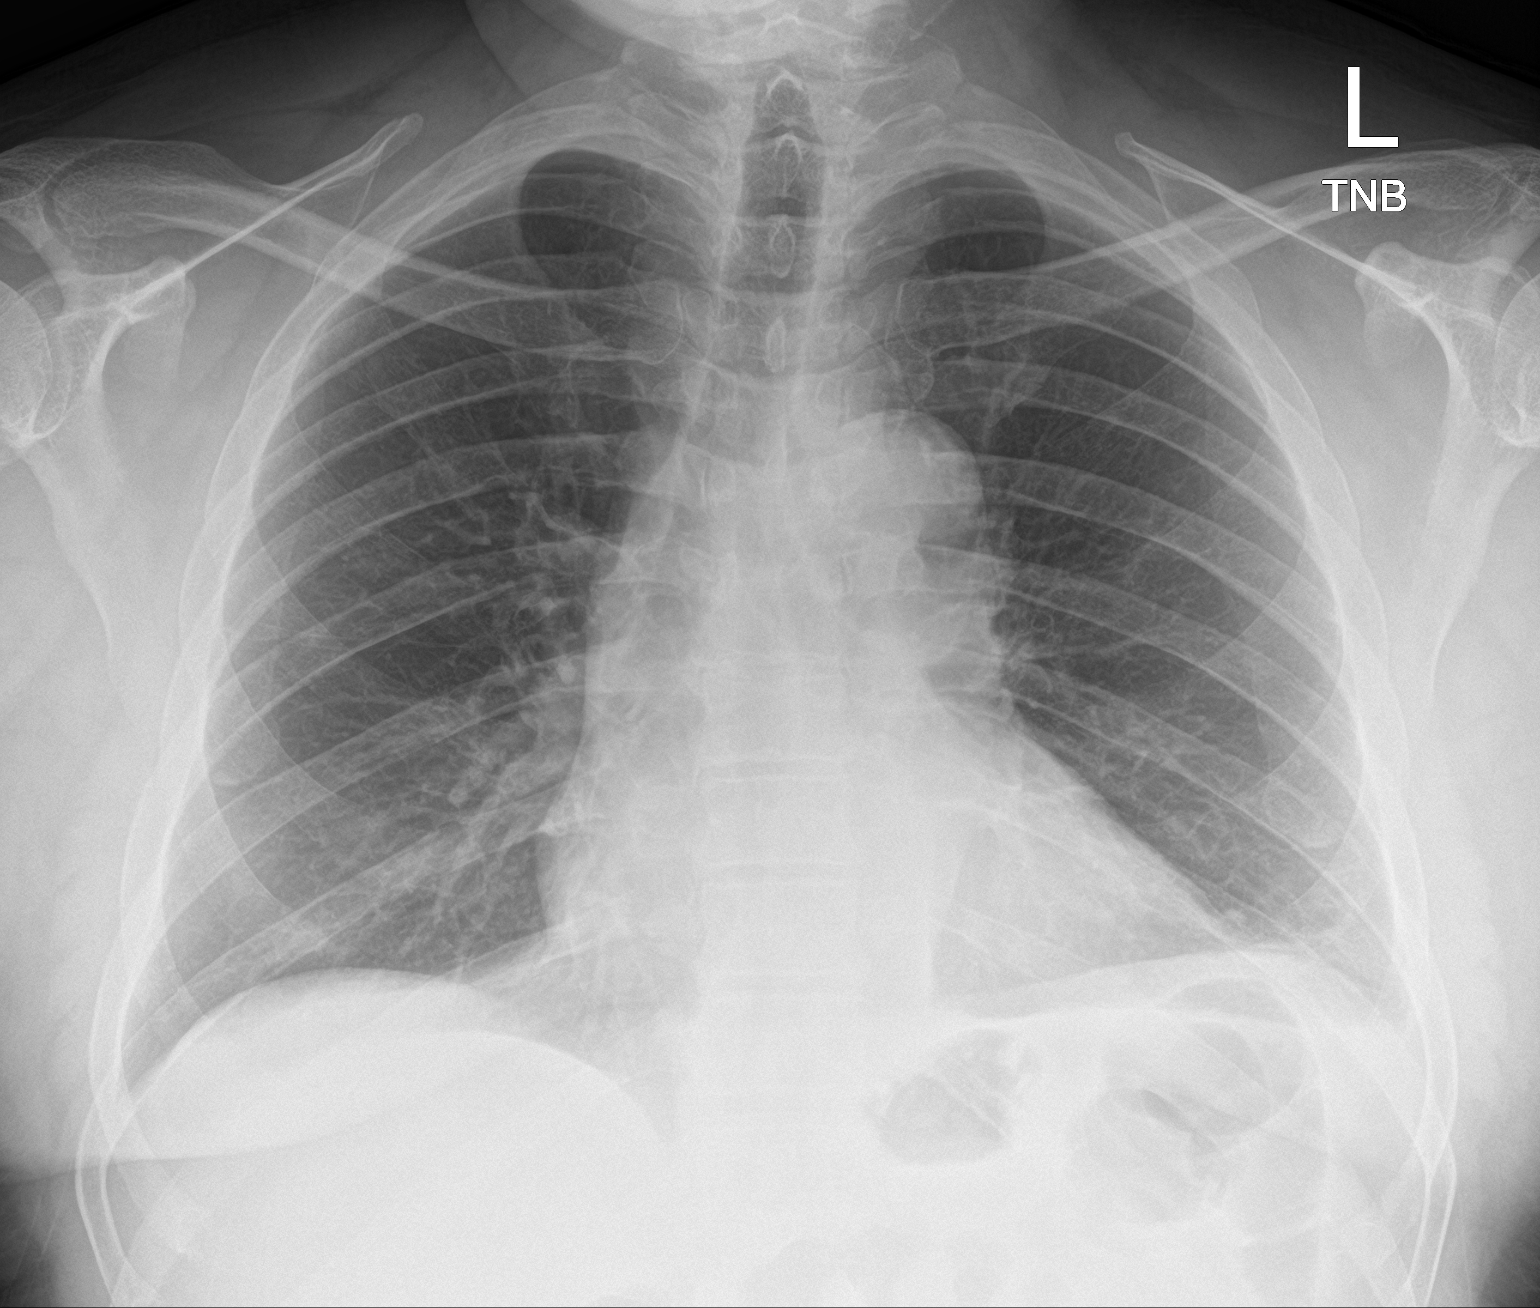

[chest lat]
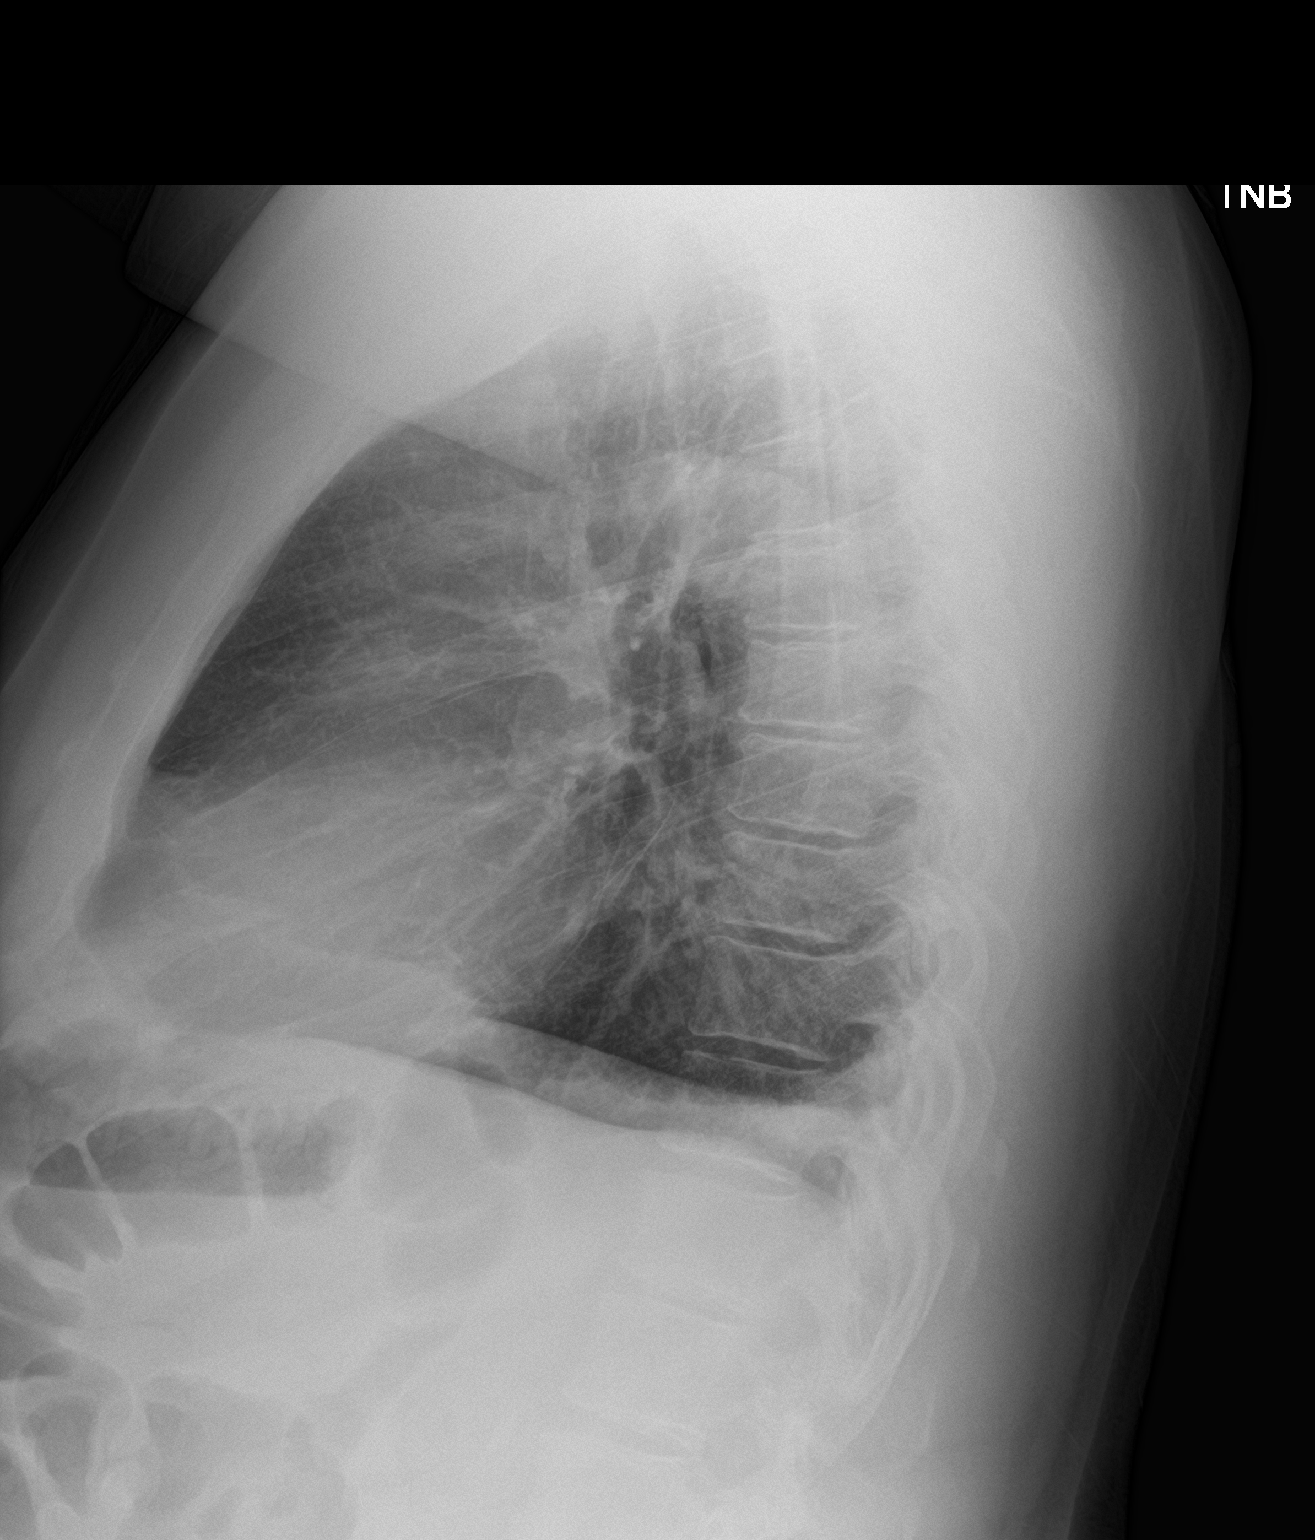

[2 of 2 positions shown; findings below may reference images not displayed]

FINDINGS: Borderline hyperinflation. There is no edema, consolidation,
effusion, or pneumothorax. Normal heart size and mediastinal
contours.
IMPRESSION: No evidence of active disease.

## 2021-10-17 ENCOUNTER — Ambulatory Visit: Payer: Medicare Other | Admitting: Orthopaedic Surgery

## 2021-10-28 ENCOUNTER — Emergency Department (HOSPITAL_COMMUNITY)
Admission: EM | Admit: 2021-10-28 | Discharge: 2021-10-28 | Disposition: A | Discharge status: Home / Self Care | Payer: Medicare Other | Attending: Emergency Medicine | Admitting: Emergency Medicine

## 2021-10-28 ENCOUNTER — Emergency Department (HOSPITAL_COMMUNITY): Payer: Medicare Other

## 2021-10-28 ENCOUNTER — Encounter (HOSPITAL_COMMUNITY): Payer: Self-pay | Admitting: Emergency Medicine

## 2021-10-28 DIAGNOSIS — F1410 Cocaine abuse, uncomplicated: Secondary | ICD-10-CM | POA: Insufficient documentation

## 2021-10-28 DIAGNOSIS — R0602 Shortness of breath: Secondary | ICD-10-CM | POA: Diagnosis not present

## 2021-10-28 DIAGNOSIS — Z79899 Other long term (current) drug therapy: Secondary | ICD-10-CM | POA: Diagnosis not present

## 2021-10-28 DIAGNOSIS — I10 Essential (primary) hypertension: Secondary | ICD-10-CM | POA: Diagnosis not present

## 2021-10-28 DIAGNOSIS — R079 Chest pain, unspecified: Secondary | ICD-10-CM | POA: Diagnosis present

## 2021-10-28 DIAGNOSIS — Z7982 Long term (current) use of aspirin: Secondary | ICD-10-CM | POA: Insufficient documentation

## 2021-10-28 LAB — CBC
HCT: 39.2 % (ref 39.0–52.0)
Hemoglobin: 13.2 g/dL (ref 13.0–17.0)
MCH: 31.7 pg (ref 26.0–34.0)
MCHC: 33.7 g/dL (ref 30.0–36.0)
MCV: 94.2 fL (ref 80.0–100.0)
Platelets: 289 10*3/uL (ref 150–400)
RBC: 4.16 MIL/uL — ABNORMAL LOW (ref 4.22–5.81)
RDW: 13.3 % (ref 11.5–15.5)
WBC: 10 10*3/uL (ref 4.0–10.5)
nRBC: 0 % (ref 0.0–0.2)

## 2021-10-28 LAB — ETHANOL: Alcohol, Ethyl (B): <10 mg/dL (ref <10)

## 2021-10-28 LAB — TROPONIN I (HIGH SENSITIVITY)
Troponin I (High Sensitivity): 8 ng/L (ref <18)
Troponin I (High Sensitivity): 7 ng/L (ref <18)

## 2021-10-28 LAB — RAPID URINE DRUG SCREEN, HOSP PERFORMED
Amphetamines: NOT DETECTED
Barbiturates: NOT DETECTED
Benzodiazepines: NOT DETECTED
Cocaine: POSITIVE — AB
Opiates: NOT DETECTED
Tetrahydrocannabinol: NOT DETECTED

## 2021-10-28 LAB — BASIC METABOLIC PANEL
Anion gap: 9 (ref 5–15)
BUN: 17 mg/dL (ref 6–20)
CO2: 25 mmol/L (ref 22–32)
Calcium: 9.7 mg/dL (ref 8.9–10.3)
Chloride: 103 mmol/L (ref 98–111)
Creatinine, Ser: 1.23 mg/dL (ref 0.61–1.24)
GFR, Estimated: >60 mL/min (ref >60)
Glucose, Bld: 113 mg/dL — ABNORMAL HIGH (ref 70–99)
Potassium: 3.4 mmol/L — ABNORMAL LOW (ref 3.5–5.1)
Sodium: 137 mmol/L (ref 135–145)

## 2021-10-28 MED ORDER — ASPIRIN 81 MG PO CHEW
324.0000 mg | CHEWABLE_TABLET | Freq: Once | ORAL | Status: AC
  Administered 2021-10-28: 324 mg via ORAL
  Filled 2021-10-28: qty 4

## 2021-10-28 MED ORDER — NITROGLYCERIN 0.4 MG SL SUBL
0.4000 mg | SUBLINGUAL_TABLET | SUBLINGUAL | Status: DC | PRN
  Administered 2021-10-28: 0.4 mg via SUBLINGUAL
  Filled 2021-10-28: qty 1

## 2021-10-28 NOTE — ED Triage Notes (Signed)
Patient here with complaint of shortness of breath and left sided chest pain radiating to back that started yesterday after using cocaine. Patient states pain improved last night but then worsened again this morning, patient denies using cocaine again. Patient is alert, oriented, ambulatory, speaking in complete sentences, and is in no apparent distress at this time.

## 2021-10-28 NOTE — Discharge Instructions (Signed)
Consider reviewing resource guides for outpatient or residential counseling options for cocaine use. Cocaine use can cause heart attacks.  Recommend discontinue use.  Return to the ER for worsening or concerning symptoms.

## 2021-10-28 NOTE — ED Provider Notes (Signed)
[facility] EMERGENCY DEPARTMENT Provider Note   CSN: [id number] Arrival date & time: 10/28/21  4098     History  Chief Complaint  Patient presents with   Chest Pain    Charles Estes is a 61 y.o. male.  61 year old male presents with complaint of shortness of breath, left-sided chest pain and vomiting after using cocaine at 8:00 last night.  Patient states his pain improved through the night but then worsened again this morning.  Pain is throbbing in nature, radiates to his back, it is not exertional, is worse bending over.  Denies personal cardiac history although reports a strong family history of heart attacks and stroke with report of his sister dying at 82 from unknown heart event.      Home Medications Prior to Admission medications   Medication Sig Start Date End Date Taking? Authorizing Provider  amLODipine (NORVASC) 10 MG tablet  04/15/17   [provider]  aspirin 81 MG chewable tablet Chew by mouth.    [provider]  Benzocaine-Menthol (CEPACOL) 15-2.3 MG LOZG Use as directed 1 tablet in the mouth or throat as needed. 10/23/20   Lamptey, Britta Mccreedy, MD  benzonatate (TESSALON) 100 MG capsule Take 1 capsule (100 mg total) by mouth every 8 (eight) hours. 10/31/20   Rushie Chestnut, PA-C  cyclobenzaprine (FLEXERIL) 5 MG tablet Take 1 tablet (5 mg total) by mouth 3 (three) times daily as needed for muscle spasms. DO NOT DRINK ALCOHOL OR DRIVE WHILE TAKING THIS MEDICATION 08/30/20   Particia Nearing, PA-C  esomeprazole (NEXIUM) 40 MG capsule Take 1 capsule (40 mg total) by mouth daily. 05/24/20   Mardella Layman, MD  FLUoxetine (PROZAC) 20 MG capsule Take by mouth. 05/05/17   [provider]  gabapentin (NEURONTIN) 100 MG capsule Take 1-3 capsules (100-300 mg total) by mouth 3 (three) times daily. 06/02/18   Tarry Kos, MD  ibuprofen (ADVIL) 600 MG tablet Take 1 tablet (600 mg total) by mouth every 6 (six) hours as needed. 10/23/20    Merrilee Jansky, MD  ipratropium (ATROVENT) 0.03 % nasal spray Place 2 sprays into both nostrils every 12 (twelve) hours. 10/23/20   Lamptey, Britta Mccreedy, MD  methocarbamol (ROBAXIN) 500 MG tablet Take 1 tablet (500 mg total) by mouth 2 (two) times daily. 06/05/20   Becky Augusta, NP  metroNIDAZOLE (FLAGYL) 500 MG tablet Take 1 tablet (500 mg total) by mouth 2 (two) times daily. 05/11/20   Merrilee Jansky, MD  oxyCODONE (OXYCONTIN) 10 mg 12 hr tablet Take 1 tablet (10 mg total) by mouth every 12 (twelve) hours. Patient not taking: Reported on 11/20/2017 07/16/17   Tarry Kos, MD  promethazine-dextromethorphan (PROMETHAZINE-DM) 6.25-15 MG/5ML syrup Take 5 mLs by mouth 4 (four) times daily as needed for cough. 10/31/20   Rushie Chestnut, PA-C  QUEtiapine Fumarate (SEROQUEL PO) Take 250 mg by mouth at bedtime.    [provider]  senna-docusate (SENOKOT S) 8.6-50 MG tablet Take 1 tablet by mouth at bedtime as needed. Patient not taking: Reported on 11/20/2017 07/16/17   Tarry Kos, MD  zinc sulfate 220 (50 Zn) MG capsule Take 1 capsule (220 mg total) by mouth daily. Patient not taking: Reported on 11/20/2017 08/28/17   Tarry Kos, MD  calcium-vitamin D (OSCAL WITH D) 500-200 MG-UNIT tablet Take 1 tablet by mouth 3 (three) times daily. Patient not taking: Reported on 11/20/2017 07/16/17 10/23/20  Tarry Kos, MD  promethazine [facility])  25 MG tablet Take 1 tablet (25 mg total) by mouth every 6 (six) hours as needed for nausea. Patient not taking: Reported on 11/20/2017 07/16/17 10/23/20  Tarry KosXu, Naiping M, MD      Allergies    Paxil [paroxetine]    Review of Systems   Review of Systems Negative except as per HPI Physical Exam Updated Vital Signs BP (!) 126/99   Pulse 85   Temp 99.2 F (37.3 C) (Oral)   Resp 17   SpO2 99%  Physical Exam Vitals and nursing note reviewed.  Constitutional:      General: He is not in acute distress.    Appearance: He is well-developed. He is not  diaphoretic.  HENT:     Head: Normocephalic and atraumatic.  Cardiovascular:     Rate and Rhythm: Normal rate and regular rhythm.     Heart sounds: Normal heart sounds.  Pulmonary:     Effort: Pulmonary effort is normal.     Breath sounds: Normal breath sounds.  Chest:     Chest wall: No tenderness.  Abdominal:     Palpations: Abdomen is soft.     Tenderness: There is no abdominal tenderness.  Musculoskeletal:     Cervical back: Neck supple.     Right lower leg: No tenderness. No edema.     Left lower leg: No tenderness. No edema.  Skin:    General: Skin is warm and dry.  Neurological:     Mental Status: He is alert and oriented to person, place, and time.  Psychiatric:        Behavior: Behavior normal.    ED Results / Procedures / Treatments   Labs (all labs ordered are listed, but only abnormal results are displayed) Labs Reviewed  BASIC METABOLIC PANEL - Abnormal; Notable for the following components:      Result Value   Potassium 3.4 (*)    Glucose, Bld 113 (*)    All other components within normal limits  CBC - Abnormal; Notable for the following components:   RBC 4.16 (*)    All other components within normal limits  RAPID URINE DRUG SCREEN, HOSP PERFORMED - Abnormal; Notable for the following components:   Cocaine POSITIVE (*)    All other components within normal limits  ETHANOL  TROPONIN I (HIGH SENSITIVITY)  TROPONIN I (HIGH SENSITIVITY)    EKG None  Radiology DG Chest 2 View  Result Date: 10/28/2021 CLINICAL DATA:  Chest pain today. EXAM: CHEST - 2 VIEW COMPARISON:  07/02/2020 FINDINGS: Poor inspiration. Grossly stable normal sized heart. Clear lungs with normal vascularity. Mild peribronchial thickening with improvement. Unremarkable bones. IMPRESSION: Mild bronchitic changes with improvement. Electronically Signed   By: Beckie SaltsSteven  Reid M.D.   On: 10/28/2021 10:41    Procedures Procedures    Medications Ordered in ED Medications  nitroGLYCERIN  (NITROSTAT) SL tablet 0.4 mg (0.4 mg Sublingual Given 10/28/21 1109)  aspirin chewable tablet 324 mg (324 mg Oral Given 10/28/21 1109)    ED Course/ Medical Decision Making/ A&P                           Medical Decision Making Amount and/or Complexity of Data Reviewed Labs: ordered. Radiology: ordered.   This patient presents to the ED for concern of chest pain after using cocaine last night, this involves an extensive number of treatment options, and is a complaint that carries with it a high risk of complications and  morbidity.  The differential diagnosis includes but not limited to ACS, arrhythmia, CHF, endocarditis    Co morbidities that complicate the patient evaluation  Cocaine abuse, HTN, GERD, depression   Additional history obtained:  External records from outside source obtained and reviewed including prior labs for comparison to today's results   Lab Tests:  I Ordered, and personally interpreted labs.  The pertinent results include:  troponin flat at 7 and 7, etoh negative. CBC and CMP without significant findings. UDS positive only for cocaine   Imaging Studies ordered:  I ordered imaging studies including CXR  I independently visualized and interpreted imaging which showed no acute disease I agree with the radiologist interpretation   Cardiac Monitoring: / EKG:  The patient was maintained on a cardiac monitor.  I personally viewed and interpreted the cardiac monitored which showed an underlying rhythm of: sinus rhythm rate 89   Consultations Obtained:  I requested consultation with the ER attending, Dr. Wilkie Aye,  and discussed lab and imaging findings as well as pertinent plan - they recommend: outpatient follow up   Problem List / ED Course / Critical interventions / Medication management  61 year old male with complaint of left-sided chest pain after using cocaine, last use at 8 PM last night.  Patient was given aspirin and nitro with some improvement in  his pain.  His EKG does not show ischemic changes today, his troponins are without significant findings at 7 and 7.  Patient is referred back to PCP for further work-up with return to ER precautions and advised to discontinue cocaine use, given outpatient resources. I have reviewed the patients home medicines and have made adjustments as needed   Social Determinants of Health:  Cocaine use, has PCP for follow up   Test / Admission - Considered:  Consider out patient stress test for CP with family history of cardiac events.          Final Clinical Impression(s) / ED Diagnoses Final diagnoses:  Chest pain, unspecified type  Cocaine abuse [facility])    Rx / DC Orders ED Discharge Orders     None         Alden Hipp 10/28/21 1448    Eber Hong, MD 10/30/21 [phone]

## 2021-12-12 DIAGNOSIS — M545 Low back pain, unspecified: Secondary | ICD-10-CM | POA: Insufficient documentation

## 2021-12-12 DIAGNOSIS — G8929 Other chronic pain: Secondary | ICD-10-CM | POA: Insufficient documentation

## 2022-01-17 ENCOUNTER — Emergency Department (HOSPITAL_COMMUNITY)
Admission: EM | Admit: 2022-01-17 | Discharge: 2022-01-18 | Disposition: A | Discharge status: Other Acute Inpatient Hospital | Payer: Medicare Other | Source: Home / Self Care | Attending: Emergency Medicine | Admitting: Emergency Medicine

## 2022-01-17 ENCOUNTER — Emergency Department (HOSPITAL_COMMUNITY): Payer: Medicare Other

## 2022-01-17 ENCOUNTER — Other Ambulatory Visit: Payer: Self-pay

## 2022-01-17 ENCOUNTER — Encounter (HOSPITAL_COMMUNITY): Payer: Self-pay | Admitting: Emergency Medicine

## 2022-01-17 DIAGNOSIS — M549 Dorsalgia, unspecified: Secondary | ICD-10-CM | POA: Insufficient documentation

## 2022-01-17 DIAGNOSIS — Z7982 Long term (current) use of aspirin: Secondary | ICD-10-CM | POA: Insufficient documentation

## 2022-01-17 DIAGNOSIS — I10 Essential (primary) hypertension: Secondary | ICD-10-CM | POA: Insufficient documentation

## 2022-01-17 DIAGNOSIS — Z20822 Contact with and (suspected) exposure to covid-19: Secondary | ICD-10-CM | POA: Insufficient documentation

## 2022-01-17 DIAGNOSIS — F329 Major depressive disorder, single episode, unspecified: Secondary | ICD-10-CM | POA: Insufficient documentation

## 2022-01-17 DIAGNOSIS — R0789 Other chest pain: Secondary | ICD-10-CM | POA: Diagnosis not present

## 2022-01-17 DIAGNOSIS — R079 Chest pain, unspecified: Secondary | ICD-10-CM | POA: Insufficient documentation

## 2022-01-17 DIAGNOSIS — R45850 Homicidal ideations: Secondary | ICD-10-CM | POA: Insufficient documentation

## 2022-01-17 DIAGNOSIS — R45851 Suicidal ideations: Secondary | ICD-10-CM | POA: Insufficient documentation

## 2022-01-17 DIAGNOSIS — F1410 Cocaine abuse, uncomplicated: Secondary | ICD-10-CM | POA: Insufficient documentation

## 2022-01-17 DIAGNOSIS — Z79899 Other long term (current) drug therapy: Secondary | ICD-10-CM | POA: Insufficient documentation

## 2022-01-17 DIAGNOSIS — F315 Bipolar disorder, current episode depressed, severe, with psychotic features: Secondary | ICD-10-CM | POA: Diagnosis not present

## 2022-01-17 LAB — BASIC METABOLIC PANEL
Anion gap: 10 (ref 5–15)
BUN: 13 mg/dL (ref 6–20)
CO2: 24 mmol/L (ref 22–32)
Calcium: 9.2 mg/dL (ref 8.9–10.3)
Chloride: 105 mmol/L (ref 98–111)
Creatinine, Ser: 1.18 mg/dL (ref 0.61–1.24)
GFR, Estimated: >60 mL/min (ref >60)
Glucose, Bld: 115 mg/dL — ABNORMAL HIGH (ref 70–99)
Potassium: 3.1 mmol/L — ABNORMAL LOW (ref 3.5–5.1)
Sodium: 139 mmol/L (ref 135–145)

## 2022-01-17 LAB — RAPID URINE DRUG SCREEN, HOSP PERFORMED
Amphetamines: NOT DETECTED
Barbiturates: NOT DETECTED
Benzodiazepines: NOT DETECTED
Cocaine: POSITIVE — AB
Opiates: NOT DETECTED
Tetrahydrocannabinol: NOT DETECTED

## 2022-01-17 LAB — ETHANOL: Alcohol, Ethyl (B): <10 mg/dL (ref <10)

## 2022-01-17 LAB — CBC
HCT: 39.7 % (ref 39.0–52.0)
Hemoglobin: 13.3 g/dL (ref 13.0–17.0)
MCH: 31.3 pg (ref 26.0–34.0)
MCHC: 33.5 g/dL (ref 30.0–36.0)
MCV: 93.4 fL (ref 80.0–100.0)
Platelets: 268 10*3/uL (ref 150–400)
RBC: 4.25 MIL/uL (ref 4.22–5.81)
RDW: 13.3 % (ref 11.5–15.5)
WBC: 8.5 10*3/uL (ref 4.0–10.5)
nRBC: 0 % (ref 0.0–0.2)

## 2022-01-17 LAB — RESP PANEL BY RT-PCR (FLU A&B, COVID) ARPGX2
Influenza A by PCR: NEGATIVE
Influenza B by PCR: NEGATIVE
SARS Coronavirus 2 by RT PCR: NEGATIVE

## 2022-01-17 LAB — TROPONIN I (HIGH SENSITIVITY)
Troponin I (High Sensitivity): 8 ng/L (ref <18)
Troponin I (High Sensitivity): 8 ng/L (ref <18)

## 2022-01-17 MED ORDER — IPRATROPIUM BROMIDE 0.03 % NA SOLN
2.0000 | Freq: Two times a day (BID) | NASAL | Status: DC
  Filled 2022-01-17 (×2): qty 30

## 2022-01-17 MED ORDER — IBUPROFEN 400 MG PO TABS
400.0000 mg | ORAL_TABLET | Freq: Four times a day (QID) | ORAL | Status: DC | PRN
Start: 2022-01-17 — End: 2022-01-18

## 2022-01-17 MED ORDER — ASPIRIN 81 MG PO CHEW: 324.0000 mg | CHEWABLE_TABLET | Freq: Every day | ORAL | Status: DC

## 2022-01-17 MED ORDER — IPRATROPIUM BROMIDE 0.06 % NA SOLN
1.0000 | Freq: Two times a day (BID) | NASAL | Status: DC
  Administered 2022-01-18: 1 via NASAL
  Filled 2022-01-17: qty 15

## 2022-01-17 MED ORDER — AMLODIPINE BESYLATE 5 MG PO TABS: 10.0000 mg | ORAL_TABLET | Freq: Every day | ORAL | Status: DC

## 2022-01-17 MED ORDER — ASPIRIN 81 MG PO CHEW
324.0000 mg | CHEWABLE_TABLET | Freq: Every day | ORAL | Status: DC
Start: 2022-01-18 — End: 2022-01-18
  Administered 2022-01-18: 324 mg via ORAL
  Filled 2022-01-17: qty 4

## 2022-01-17 MED ORDER — AMLODIPINE BESYLATE 5 MG PO TABS
10.0000 mg | ORAL_TABLET | Freq: Every day | ORAL | Status: DC
  Administered 2022-01-18: 10 mg via ORAL
  Filled 2022-01-17: qty 2

## 2022-01-17 MED ORDER — POTASSIUM CHLORIDE CRYS ER 20 MEQ PO TBCR
40.0000 meq | EXTENDED_RELEASE_TABLET | Freq: Once | ORAL | Status: AC
  Administered 2022-01-17: 40 meq via ORAL
  Filled 2022-01-17: qty 2

## 2022-01-17 NOTE — ED Notes (Signed)
Attempted to call pt's daughter multiple times with pt's consent. No answer and no voice mail setup

## 2022-01-17 NOTE — ED Notes (Addendum)
Pt states he has had SI for several days. Pt reports plan and intent of sitting on railroad tracks. Reports he has been off of his depression medication for about a year. Reports having nightmares of dead people. Pt is calm and cooperative at this time. Pt provided with meal tray.

## 2022-01-17 NOTE — ED Notes (Signed)
Mayra Neer (Daughter) called asking for an update. Her number is [phone].

## 2022-01-17 NOTE — BH Assessment (Signed)
Comprehensive Clinical Assessment (CCA) Note  01/17/2022 Charles Estes [id number]  Disposition: Per Doran Heater, NP, patient recommended for inpatient treatment.   Flowsheet Row ED from 01/17/2022 in [facility] EMERGENCY DEPARTMENT ED from 10/28/2021 in [facility] EMERGENCY DEPARTMENT ED from 11/11/2020 in [facility] at [facility] RISK CATEGORY High Risk No Risk Error: Question 6 not populated      The patient demonstrates the following risk factors for suicide: Chronic risk factors for suicide include: psychiatric disorder of depression, substance use disorder, and previous suicide attempts x1 . Acute risk factors for suicide include: family or marital conflict and loss (financial, interpersonal, professional). Protective factors for this patient include: responsibility to others (children, family). Considering these factors, the overall suicide risk at this point appears to be high. Patient is not appropriate for outpatient follow up.  Charles Estes is a 61 year old male presenting to [facility] voluntarily with chief complaint of chest pain and suicidal ideations. Patient states "I been having mood swings, hanging out late at night and feeling depressed for the past three months". Patient reports he has attended several funerals in the past three months and has been thinking a lot about killing himself and other people. Patient reports his neighbor which was also a close friend passed away recently which really affected him. Patient reports "I'm tired of living like this". Patient reports he called his daughter and told her that he had a good life insurance plan, so she did not have to worry about money when he dies. Patient reports he came in late last night and his girlfriend of 4  years is upset that he comes in late. After patient arrived in the ED police served him a 50B. Patient denies harming or making any recent threats to his girlfriend however, a  month ago patient told his girlfriend that he was going to "snatch all the hair off of his head". Patient reports he is disappointment in the person he has become, by staying out at night, using drugs, and shooting his gun in the air. Patient states "when I look at myself in the mirror I look like a demon".   Patient reports diagnosis of depression and he has been off his psychotropic medications for about a year. Patient does not have outpatient services; however, he has a history of inpatient treatment about 15 years ago. Patient relapsed about three months ago on cocaine. Patient uses about $30-$40 worth of cocaine/crack every other week and he reports drinking 3-4 standard size beers every other day. Patient is a retired Naval architect, and he lives with his girlfriend and her 9 year old son. Patient owns a gun that is in a safe at his home. Patient only legal issues is the 50B.  Patient is oriented x4, engaged, alert and cooperative during the assessment. Patient eye contact and speech is normal, affect is depressed with congruent mood, and he is tearful. Patient reports SI for the past month with plan to park his car on the railroad tracks and he to grab a police gun in hopes that they shoot him. Patient is unable to contact for safety. Patient denies current HI, AVH.   Chief Complaint:  Chief Complaint  Patient presents with   Chest Pain   Back Pain   Visit Diagnosis: Suicidal ideations    CCA Screening, Triage and Referral (STR)  Patient Reported Information How did you hear about [place]? Self  What Is the Reason for Your Visit/Call Today? Chest  pain and suicidal ideations  How Long Has This Been Causing You Problems? 1 wk - 1 month  What Do You Feel Would Help You the Most Today? Treatment for Depression or other mood problem   Have You Recently Had Any Thoughts About Hurting Yourself? Yes  Are You Planning to Commit Suicide/Harm Yourself At This time? Yes   Have you Recently Had  Thoughts About Hurting Someone [place] Dawn? No  Are You Planning to Harm Someone at This Time? No  Explanation: No data recorded  Have You Used Any Alcohol or Drugs in the Past 24 Hours? Yes  How Long Ago Did You Use Drugs or Alcohol? No data recorded What Did You Use and How Much? $30-$40 of cocaine   Do You Currently Have a Therapist/Psychiatrist? No  Name of Therapist/Psychiatrist: No data recorded  Have You Been Recently Discharged From Any Office Practice or Programs? No  Explanation of Discharge From Practice/Program: No data recorded    CCA Screening Triage Referral Assessment Type of Contact: Tele-Assessment  Telemedicine Service Delivery: Telemedicine service delivery: This service was provided via telemedicine using a 2-way, interactive audio and video technology  Is this Initial or Reassessment? Initial Assessment  Date Telepsych consult ordered in [facility]:  01/17/22  Time Telepsych consult ordered in [facility]:  No data recorded Location of Assessment: [facility] Assessment Services  Provider Location: [facility] Services   Collateral Involvement: none   Does Patient Have a Stage manager Guardian? No data recorded Name and Contact of Legal Guardian: No data recorded If Minor and Not Living with Parent(s), Who has Custody? No data recorded Is CPS involved or ever been involved? No data recorded Is APS involved or ever been involved? No data recorded  Patient Determined To Be At Risk for Harm To Self or Others Based on Review of Patient Reported Information or Presenting Complaint? Yes, for Self-Harm  Method: No data recorded Availability of Means: No data recorded Intent: No data recorded Notification Required: No data recorded Additional Information for Danger to Others Potential: No data recorded Additional Comments for Danger to Others Potential: No data recorded Are There Guns or Other Weapons in Your Home? No data recorded Types of Guns/Weapons: No data  recorded Are These Weapons Safely Secured?                            No data recorded Who Could Verify You Are Able To Have These Secured: No data recorded Do You Have any Outstanding Charges, Pending Court Dates, Parole/Probation? No data recorded Contacted To Inform of Risk of Harm To Self or Others: Unable to Contact:    Does Patient Present under Involuntary Commitment? No  IVC Papers Initial File Date: No data recorded  [place] of Residence: [place]   Patient Currently Receiving the Following Services: Not Receiving Services   Determination of Need: Emergent (2 hours)   Options For Referral: Medication Management; Outpatient Therapy; Inpatient Hospitalization     CCA Biopsychosocial Patient Reported Schizophrenia/Schizoaffective Diagnosis in Past: No   Strengths: No data recorded  Mental Health Symptoms Depression:   Change in energy/activity; Difficulty Concentrating; Irritability; Tearfulness   Duration of Depressive symptoms:  Duration of Depressive Symptoms: Greater than two weeks   Mania:   None   Anxiety:    None   Psychosis:   None   Duration of Psychotic symptoms:    Trauma:   None   Obsessions:   None  Compulsions:   None   Inattention:   None   Hyperactivity/Impulsivity:   None   Oppositional/Defiant Behaviors:   None   Emotional Irregularity:   None   Other Mood/Personality Symptoms:  No data recorded   Mental Status Exam Appearance and self-care  Stature:   Average   Weight:   Average weight   Clothing:   Age-appropriate; Neat/clean   Grooming:   Normal   Cosmetic use:   None   Posture/gait:   Normal   Motor activity:   Not Remarkable   Sensorium  Attention:   Normal   Concentration:   Normal   Orientation:   Person; Place; Situation; Time   Recall/memory:   Normal   Affect and Mood  Affect:   Negative; Tearful; Depressed   Mood:   Depressed   Relating  Eye contact:   Normal    Facial expression:   Responsive   Attitude toward examiner:   Cooperative   Thought and Language  Speech flow:  Clear and Coherent   Thought content:   Appropriate to Mood and Circumstances   Preoccupation:   None   Hallucinations:   None   Organization:  No data recorded  [employer] of Knowledge:   Fair   Intelligence:   Average   Abstraction:   Normal   Judgement:   Fair   Dance movement psychotherapist:   Variable   Insight:   Fair   Decision Making:   Normal   Social Functioning  Social Maturity:   Responsible   Social Judgement:   Normal   Stress  Stressors:   Grief/losses; Legal; Relationship   Coping Ability:   Overwhelmed   Skill Deficits:   None   Supports:   Family     Religion:    Leisure/Recreation:    Exercise/Diet: Exercise/Diet Do You Exercise?: No Have You Gained or Lost A Significant Amount of Weight in the Past Six Months?: No Do You Follow a Special Diet?: No Do You Have Any Trouble Sleeping?: No   CCA Employment/Education Employment/Work Situation: 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 transport manager has Been Impacted by Current Illness: No Has Patient ever Been in Equities trader?: No  Education: Education Is Patient Currently Attending School?: No   CCA Family/Childhood History Family and Relationship History: Family history Does patient have children?: Yes  Childhood History:  Childhood History Did patient suffer any verbal/emotional/physical/sexual abuse as a child?: No Did patient suffer from severe childhood neglect?: No Has patient ever been sexually abused/assaulted/raped as an adolescent or adult?: No Was the patient ever a victim of a crime or a disaster?: No Witnessed domestic violence?: No Has patient been affected by domestic violence as an adult?: No  Child/Adolescent Assessment:     CCA Substance Use Alcohol/Drug Use: Alcohol / Drug Use Pain Medications:  See MAR Prescriptions: See MAR Over the Counter: See MAR History of alcohol / drug use?: Yes Longest period of sobriety (when/how long): 3 years, replapsed about three months ago Negative Consequences of Use: Personal relationships Substance #1 Name of Substance 1: Cocaine 1 - Age of First Use: 27 1 - Amount (size/oz): $30-$40 1 - Frequency: every other week 1 - Duration: 3 months 1 - Last Use / Amount: yesterday $40                       ASAM's:  Six Dimensions of Multidimensional Assessment  Dimension 1:  Acute Intoxication and/or Withdrawal Potential:  Dimension 2:  Biomedical Conditions and Complications:      Dimension 3:  Emotional, Behavioral, or Cognitive Conditions and Complications:     Dimension 4:  Readiness to Change:     Dimension 5:  Relapse, Continued use, or Continued Problem Potential:     Dimension 6:  Recovery/Living Environment:     ASAM Severity Score:    ASAM Recommended Level of Treatment: ASAM Recommended Level of Treatment: Level II Intensive Outpatient Treatment   Substance use Disorder (SUD)    Recommendations for Services/Supports/Treatments: Recommendations for Services/Supports/Treatments Recommendations For Services/Supports/Treatments: IOP (Intensive Outpatient Program)  Discharge Disposition:    DSM5 Diagnoses: Patient Active Problem List   Diagnosis Date Noted   Olecranon bursitis, right elbow 06/10/2017   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

## 2022-01-17 NOTE — Progress Notes (Signed)
CSW requested that [facility] Fransico Michael, RN review. CSW will assist with placement.  Maryjean Ka, MSW, [facility] 01/17/2022 10:07 PM

## 2022-01-17 NOTE — BH Assessment (Signed)
@  10:15 contacted RN Emilee S. To set up TTS assessment. She asked me to wait for a sec and then, I did not here back. Had to move on to another pt for now.
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
## 2022-01-17 NOTE — ED Provider Notes (Signed)
[facility] EMERGENCY DEPARTMENT Provider Note   CSN: [id number] Arrival date & time: 01/17/22  1962     History  Chief Complaint  Patient presents with  . Chest Pain  . Back Pain    Charles Estes is a 61 y.o. male.   Chest Pain Associated symptoms: back pain   Back Pain Associated symptoms: chest pain    Patient has a history of reflux, hypertension, depression and cocaine use.  Patient presented to the ED for evaluation of chest pain.  Patient states that started left easing around 11 PM.  He was on the left side of his chest rating to his back.  He felt as if he was hit with a sledgehammer.  Patient states he also started to feel somewhat short of breath and nauseated.  Patient has been using cocaine and did use last night for the event occurred.  Patient denies any history of heart attack.  No history of PE.  Also complains of being suicidal.  He states he wants to park his car on a train track.  He also intends to kill someone that he is upset with.    Home Medications Prior to Admission medications   Medication Sig Start Date End Date Taking? Authorizing Provider  amLODipine (NORVASC) 10 MG tablet  04/15/17  Yes [provider]  aspirin 81 MG chewable tablet Chew by mouth.   Yes [provider]  esomeprazole (NEXIUM) 40 MG capsule Take 1 capsule (40 mg total) by mouth daily. 05/24/20  Yes Mardella Layman, MD  ibuprofen (ADVIL) 600 MG tablet Take 1 tablet (600 mg total) by mouth every 6 (six) hours as needed. 10/23/20  Yes Lamptey, Britta Mccreedy, MD  ipratropium (ATROVENT) 0.03 % nasal spray Place 2 sprays into both nostrils every 12 (twelve) hours. 10/23/20  Yes Lamptey, Britta Mccreedy, MD  Benzocaine-Menthol (CEPACOL) 15-2.3 MG LOZG Use as directed 1 tablet in the mouth or throat as needed. Patient not taking: Reported on 01/17/2022 10/23/20   Merrilee Jansky, MD  benzonatate (TESSALON) 100 MG capsule Take 1 capsule (100 mg total) by mouth every 8  (eight) hours. Patient not taking: Reported on 01/17/2022 10/31/20   Rushie Chestnut, PA-C  cyclobenzaprine (FLEXERIL) 5 MG tablet Take 1 tablet (5 mg total) by mouth 3 (three) times daily as needed for muscle spasms. DO NOT DRINK ALCOHOL OR DRIVE WHILE TAKING THIS MEDICATION Patient not taking: Reported on 01/17/2022 08/30/20   Particia Nearing, PA-C  FLUoxetine (PROZAC) 20 MG capsule Take by mouth. Patient not taking: Reported on 01/17/2022 05/05/17   [provider]  gabapentin (NEURONTIN) 100 MG capsule Take 1-3 capsules (100-300 mg total) by mouth 3 (three) times daily. Patient not taking: Reported on 01/17/2022 06/02/18   Tarry Kos, MD  methocarbamol (ROBAXIN) 500 MG tablet Take 1 tablet (500 mg total) by mouth 2 (two) times daily. Patient not taking: Reported on 01/17/2022 06/05/20   Becky Augusta, NP  metroNIDAZOLE (FLAGYL) 500 MG tablet Take 1 tablet (500 mg total) by mouth 2 (two) times daily. Patient not taking: Reported on 01/17/2022 05/11/20   Merrilee Jansky, MD  oxyCODONE (OXYCONTIN) 10 mg 12 hr tablet Take 1 tablet (10 mg total) by mouth every 12 (twelve) hours. Patient not taking: Reported on 11/20/2017 07/16/17   Tarry Kos, MD  promethazine-dextromethorphan (PROMETHAZINE-DM) 6.25-15 MG/5ML syrup Take 5 mLs by mouth 4 (four) times daily as needed for cough. Patient not taking: Reported on 01/17/2022 10/31/20  Nelwyn Salisbury M, PA-C  QUEtiapine Fumarate (SEROQUEL PO) Take 250 mg by mouth at bedtime. Patient not taking: Reported on 01/17/2022    [provider]  senna-docusate (SENOKOT S) 8.6-50 MG tablet Take 1 tablet by mouth at bedtime as needed. Patient not taking: Reported on 11/20/2017 07/16/17   Leandrew Koyanagi, MD  zinc sulfate 220 (50 Zn) MG capsule Take 1 capsule (220 mg total) by mouth daily. Patient not taking: Reported on 11/20/2017 08/28/17   Leandrew Koyanagi, MD  calcium-vitamin D (OSCAL WITH D) 500-200 MG-UNIT tablet Take 1 tablet by mouth 3 (three)  times daily. Patient not taking: Reported on 11/20/2017 07/16/17 10/23/20  Leandrew Koyanagi, MD  promethazine (PHENERGAN) 25 MG tablet Take 1 tablet (25 mg total) by mouth every 6 (six) hours as needed for nausea. Patient not taking: Reported on 11/20/2017 07/16/17 10/23/20  Leandrew Koyanagi, MD      Allergies    Paxil [paroxetine]    Review of Systems   Review of Systems  Cardiovascular:  Positive for chest pain.  Musculoskeletal:  Positive for back pain.    Physical Exam Updated Vital Signs BP (!) 146/96   Pulse 67   Temp 98.2 F (36.8 C) (Oral)   Resp 16   Ht 1.676 m (5\' 6" )   Wt 93 kg   SpO2 95%   BMI 33.09 kg/m  Physical Exam Vitals and nursing note reviewed.  Constitutional:      General: He is not in acute distress.    Appearance: He is well-developed.  HENT:     Head: Normocephalic and atraumatic.     Right Ear: External ear normal.     Left Ear: External ear normal.  Eyes:     General: No scleral icterus.       Right eye: No discharge.        Left eye: No discharge.     Conjunctiva/sclera: Conjunctivae normal.  Neck:     Trachea: No tracheal deviation.  Cardiovascular:     Rate and Rhythm: Normal rate and regular rhythm.  Pulmonary:     Effort: Pulmonary effort is normal. No respiratory distress.     Breath sounds: Normal breath sounds. No stridor. No wheezing or rales.  Abdominal:     General: Bowel sounds are normal. There is no distension.     Palpations: Abdomen is soft.     Tenderness: There is no abdominal tenderness. There is no guarding or rebound.  Musculoskeletal:        General: No tenderness or deformity.     Cervical back: Neck supple.  Skin:    General: Skin is warm and dry.     Findings: No rash.  Neurological:     General: No focal deficit present.     Mental Status: He is alert.     Cranial Nerves: No cranial nerve deficit (no facial droop, extraocular movements intact, no slurred speech).     Sensory: No sensory deficit.     Motor: No  abnormal muscle tone or seizure activity.     Coordination: Coordination normal.  Psychiatric:        Mood and Affect: Mood is depressed.        Thought Content: Thought content includes homicidal and suicidal ideation.     ED Results / Procedures / Treatments   Labs (all labs ordered are listed, but only abnormal results are displayed) Labs Reviewed  BASIC METABOLIC PANEL - Abnormal; Notable for the following components:  Result Value   Potassium 3.1 (*)    Glucose, Bld 115 (*)    All other components within normal limits  RESP PANEL BY RT-PCR (FLU A&B, COVID) ARPGX2  CBC  ETHANOL  RAPID URINE DRUG SCREEN, HOSP PERFORMED  TROPONIN I (HIGH SENSITIVITY)  TROPONIN I (HIGH SENSITIVITY)    EKG EKG Interpretation  Date/Time:  Thursday January 17 2022 09:25:31 EDT Ventricular Rate:  86 PR Interval:  152 QRS Duration: 84 QT Interval:  392 QTC Calculation: 469 R Axis:   0 Text Interpretation: Normal sinus rhythm Normal ECG When compared with ECG of 28-Oct-2021 10:02, PREVIOUS ECG IS PRESENT No significant change since last tracing Confirmed by Linwood Dibbles [phone]) on 01/17/2022 10:01:57 AM  Radiology DG Chest 2 View  Result Date: 01/17/2022 CLINICAL DATA:  Chest pain since 11 p.m. last night. EXAM: CHEST - 2 VIEW COMPARISON:  10/28/2021 FINDINGS: Normal sized heart. Tortuous aorta. Clear lungs with normal vascularity. Mild thoracic spine degenerative changes. IMPRESSION: No active cardiopulmonary disease. Electronically Signed   By: Beckie Salts M.D.   On: 01/17/2022 09:50    Procedures Procedures    Medications Ordered in ED Medications - No data to display  ED Course/ Medical Decision Making/ A&P Clinical Course as of 01/17/22 1509  Thu Jan 17, 2022  1508 Serial troponins normal.  CBC normal.  Metabolic panel with mild hypokalemia.  COVID test negative.  Alcohol negative [JK]    Clinical Course User Index [JK] Linwood Dibbles, MD           HEART Score: 2                 Medical Decision Making Problems Addressed: Chest pain, unspecified type: acute illness or injury Cocaine abuse [facility]): acute illness or injury Suicidal ideation: acute illness or injury that poses a threat to life or bodily functions  Amount and/or Complexity of Data Reviewed Labs: ordered. Radiology: ordered.   Patient presented to the ED for evaluation of chest pain.  This was precipitated by recent cocaine use.  Patient's ED work-up is reassuring.  Serial troponins are normal.  No signs of acute cardiac ischemia.  Patient also complaining of suicidal and homicidal ideation.  Patient is medically cleared at this time.  I will consult psychiatry for evaluation and treatment.  The patient has been placed in psychiatric observation due to the need to provide a safe environment for the patient while obtaining psychiatric consultation and evaluation, as well as ongoing medical and medication management to treat the patient's condition.  The patient has not been placed under full IVC at this time.          Final Clinical Impression(s) / ED Diagnoses Final diagnoses:  Chest pain, unspecified type  Suicidal ideation  Cocaine abuse [facility])    Rx / DC Orders ED Discharge Orders     None         Linwood Dibbles, MD 01/17/22 [phone]

## 2022-01-17 NOTE — BH Assessment (Signed)
@  12 noon- Contacted RN Amelia Jo  by phone to set up TTS assessment. Per RN, pt is not in a room and she suggested seeing him when he gets in a room. ADvised we would try again later.

## 2022-01-17 NOTE — ED Provider Triage Note (Signed)
Emergency Medicine Provider Triage Evaluation Note  Charles Estes , a 61 y.o. male  was evaluated in triage.  Pt complains of chest pain. Pain is located in left chest and radiates to the back. Also endorses left leg numbness. States he snorted cocaine last night and chest pain started after. Also endorse vomiting. Denies SOB or diaphoresis. Denies abdominal pain. Also states that he has had recent thoughts of ending his life.   Review of Systems  Positive: as above  Negative: as above   Physical Exam  BP (!) 142/103 (BP Location: Left Arm)   Pulse 85   Temp 98.2 F (36.8 C) (Oral)   Resp 18   Ht 5\' 6"  (1.676 m)   Wt 93 kg   SpO2 96%   BMI 33.09 kg/m  Gen:   Awake, no distress  Resp:  Normal effort  MSK:   Moves extremities without difficulty  Other:    Medical Decision Making  Medically screening exam initiated at 9:30 AM.  Appropriate orders placed.  Charles Estes was informed that the remainder of the evaluation will be completed by another provider, this initial triage assessment does not replace that evaluation, and the importance of remaining in the ED until their evaluation is complete.  Labs and EKG ordered for chest pain. Psych hold orders for SI.    Yetta Barre, PA-C 01/17/22 [phone]

## 2022-01-17 NOTE — ED Provider Notes (Signed)
Tts recommended inpt admission.   Gwyneth Sprout, MD 01/17/22 2114

## 2022-01-18 ENCOUNTER — Encounter (HOSPITAL_COMMUNITY): Payer: Self-pay | Admitting: Family

## 2022-01-18 ENCOUNTER — Other Ambulatory Visit: Payer: Self-pay | Admitting: Psychiatry

## 2022-01-18 ENCOUNTER — Other Ambulatory Visit: Payer: Self-pay

## 2022-01-18 ENCOUNTER — Inpatient Hospital Stay (HOSPITAL_COMMUNITY)
Admission: AD | Admit: 2022-01-18 | Discharge: 2022-01-24 | DRG: 885 | Disposition: A | Discharge status: Home / Self Care | Payer: Medicare Other | Source: Intra-hospital | Attending: Emergency Medicine | Admitting: Emergency Medicine

## 2022-01-18 DIAGNOSIS — E669 Obesity, unspecified: Secondary | ICD-10-CM | POA: Diagnosis present

## 2022-01-18 DIAGNOSIS — K5900 Constipation, unspecified: Secondary | ICD-10-CM | POA: Diagnosis present

## 2022-01-18 DIAGNOSIS — E876 Hypokalemia: Secondary | ICD-10-CM | POA: Diagnosis present

## 2022-01-18 DIAGNOSIS — F411 Generalized anxiety disorder: Secondary | ICD-10-CM | POA: Diagnosis present

## 2022-01-18 DIAGNOSIS — G4700 Insomnia, unspecified: Secondary | ICD-10-CM | POA: Diagnosis present

## 2022-01-18 DIAGNOSIS — F315 Bipolar disorder, current episode depressed, severe, with psychotic features: Principal | ICD-10-CM | POA: Diagnosis present

## 2022-01-18 DIAGNOSIS — Z8249 Family history of ischemic heart disease and other diseases of the circulatory system: Secondary | ICD-10-CM | POA: Diagnosis not present

## 2022-01-18 DIAGNOSIS — I10 Essential (primary) hypertension: Secondary | ICD-10-CM | POA: Diagnosis present

## 2022-01-18 DIAGNOSIS — R45851 Suicidal ideations: Secondary | ICD-10-CM | POA: Diagnosis present

## 2022-01-18 DIAGNOSIS — Z62810 Personal history of physical and sexual abuse in childhood: Secondary | ICD-10-CM | POA: Diagnosis present

## 2022-01-18 DIAGNOSIS — R0789 Other chest pain: Secondary | ICD-10-CM | POA: Diagnosis present

## 2022-01-18 DIAGNOSIS — Z7982 Long term (current) use of aspirin: Secondary | ICD-10-CM

## 2022-01-18 DIAGNOSIS — Z20822 Contact with and (suspected) exposure to covid-19: Secondary | ICD-10-CM | POA: Diagnosis present

## 2022-01-18 DIAGNOSIS — Z6831 Body mass index (BMI) 31.0-31.9, adult: Secondary | ICD-10-CM | POA: Diagnosis not present

## 2022-01-18 DIAGNOSIS — K6289 Other specified diseases of anus and rectum: Secondary | ICD-10-CM | POA: Diagnosis present

## 2022-01-18 DIAGNOSIS — F1410 Cocaine abuse, uncomplicated: Secondary | ICD-10-CM | POA: Diagnosis present

## 2022-01-18 DIAGNOSIS — Z79899 Other long term (current) drug therapy: Secondary | ICD-10-CM

## 2022-01-18 DIAGNOSIS — Z888 Allergy status to other drugs, medicaments and biological substances status: Secondary | ICD-10-CM

## 2022-01-18 DIAGNOSIS — Z789 Other specified health status: Secondary | ICD-10-CM

## 2022-01-18 DIAGNOSIS — Z818 Family history of other mental and behavioral disorders: Secondary | ICD-10-CM

## 2022-01-18 DIAGNOSIS — K219 Gastro-esophageal reflux disease without esophagitis: Secondary | ICD-10-CM | POA: Diagnosis present

## 2022-01-18 DIAGNOSIS — Z87820 Personal history of traumatic brain injury: Secondary | ICD-10-CM

## 2022-01-18 DIAGNOSIS — F17210 Nicotine dependence, cigarettes, uncomplicated: Secondary | ICD-10-CM | POA: Diagnosis present

## 2022-01-18 DIAGNOSIS — R45850 Homicidal ideations: Secondary | ICD-10-CM | POA: Diagnosis present

## 2022-01-18 DIAGNOSIS — Z79891 Long term (current) use of opiate analgesic: Secondary | ICD-10-CM

## 2022-01-18 DIAGNOSIS — F32A Depression, unspecified: Principal | ICD-10-CM

## 2022-01-18 MED ORDER — PANTOPRAZOLE SODIUM 40 MG PO TBEC
40.0000 mg | DELAYED_RELEASE_TABLET | Freq: Every day | ORAL | Status: DC
  Administered 2022-01-18 – 2022-01-24 (×7): 40 mg via ORAL
  Filled 2022-01-18 (×10): qty 1

## 2022-01-18 MED ORDER — WHITE PETROLATUM EX OINT
TOPICAL_OINTMENT | CUTANEOUS | Status: AC
  Administered 2022-01-19: 1
  Filled 2022-01-18: qty 5

## 2022-01-18 MED ORDER — AMLODIPINE BESYLATE 10 MG PO TABS
10.0000 mg | ORAL_TABLET | Freq: Every day | ORAL | Status: DC
  Administered 2022-01-19: 10 mg via ORAL
  Filled 2022-01-18 (×3): qty 1

## 2022-01-18 MED ORDER — SENNOSIDES-DOCUSATE SODIUM 8.6-50 MG PO TABS
1.0000 | ORAL_TABLET | Freq: Every evening | ORAL | Status: DC | PRN
Start: 2022-01-18 — End: 2022-01-24
  Administered 2022-01-18: 1 via ORAL
  Filled 2022-01-18: qty 1

## 2022-01-18 MED ORDER — OLANZAPINE 5 MG PO TBDP
5.0000 mg | ORAL_TABLET | Freq: Three times a day (TID) | ORAL | Status: DC | PRN
  Administered 2022-01-18: 5 mg via ORAL
  Filled 2022-01-18: qty 1

## 2022-01-18 MED ORDER — LORAZEPAM 1 MG PO TABS
1.0000 mg | ORAL_TABLET | ORAL | Status: AC | PRN
  Administered 2022-01-18: 1 mg via ORAL
  Filled 2022-01-18: qty 1

## 2022-01-18 MED ORDER — POLYETHYLENE GLYCOL 3350 17 G PO PACK: 17.0000 g | PACK | Freq: Every day | ORAL | Status: DC

## 2022-01-18 MED ORDER — ASPIRIN 325 MG PO TABS
325.0000 mg | ORAL_TABLET | Freq: Every day | ORAL | Status: DC
  Administered 2022-01-19: 325 mg via ORAL
  Filled 2022-01-18 (×3): qty 1

## 2022-01-18 MED ORDER — ZIPRASIDONE MESYLATE 20 MG IM SOLR
20.0000 mg | INTRAMUSCULAR | Status: DC | PRN
Start: 2022-01-18 — End: 2022-01-19

## 2022-01-18 MED ORDER — DOCUSATE SODIUM 100 MG PO CAPS: 100.0000 mg | ORAL_CAPSULE | Freq: Every day | ORAL | Status: DC

## 2022-01-18 MED ORDER — IPRATROPIUM BROMIDE 0.06 % NA SOLN
2.0000 | Freq: Three times a day (TID) | NASAL | Status: DC
  Filled 2022-01-18: qty 15

## 2022-01-18 NOTE — ED Notes (Signed)
Pt ambulated with steady gait outside to safe transport vehicle.

## 2022-01-18 NOTE — Progress Notes (Signed)
Patient did not atten AA wrap up group.

## 2022-01-18 NOTE — Tx Team (Signed)
Initial Treatment Plan 01/18/2022 8:28 PM Sujay Yetta Barre [id number]    PATIENT STRESSORS: Financial difficulties   Health problems   Marital or family conflict   Medication change or noncompliance   Substance abuse     PATIENT STRENGTHS: Ability for insight  Active sense of humor  Average or above average intelligence  Capable of independent living  Communication skills  General fund of knowledge  Motivation for treatment/growth  Supportive family/friends    PATIENT IDENTIFIED PROBLEMS: "Suicide thoughts"  "Cocaine use"  "Anxiety"  "Depression"               DISCHARGE CRITERIA:  Ability to meet basic life and health needs Adequate post-discharge living arrangements Improved stabilization in mood, thinking, and/or behavior Medical problems require only outpatient monitoring Motivation to continue treatment in a less acute level of care Need for constant or close observation no longer present Reduction of life-threatening or endangering symptoms to within safe limits Safe-care adequate arrangements made Verbal commitment to aftercare and medication compliance Withdrawal symptoms are absent or subacute and managed without 24-hour nursing intervention  PRELIMINARY DISCHARGE PLAN: Attend aftercare/continuing care group Attend PHP/IOP Attend 12-step recovery group Outpatient therapy Return to previous living arrangement  PATIENT/FAMILY INVOLVEMENT: This treatment plan has been presented to and reviewed with the patient, Kabeer Garrelts.  The patient and family have been given the opportunity to ask questions and make suggestions.  Quintella Reichert [place], [place] 01/18/2022, 8:28 PM

## 2022-01-18 NOTE — ED Notes (Signed)
Safe transport called for patient transport to [facility].

## 2022-01-18 NOTE — Plan of Care (Signed)
Nurse discussed anxiety, depression and coping skills with patient.  

## 2022-01-18 NOTE — ED Provider Notes (Signed)
Emergency Medicine Observation Re-evaluation Note  Charles Estes is a 61 y.o. male, seen on rounds today.  Pt initially presented to the ED for complaints of Chest Pain and Back Pain Currently, the patient is resting comfortably in bed.  Patient evaluated by TTS and recommended inpatient psychiatric treatment.  Patient remains medically cleared from prior provider.  Physical Exam  BP 138/82 (BP Location: Left Arm)   Pulse 69   Temp 98.4 F (36.9 C) (Oral)   Resp 14   Ht 5\' 6"  (1.676 m)   Wt 93 kg   SpO2 99%   BMI 33.09 kg/m  Physical Exam General: Appears to be resting comfortably in bed, no acute distress. Cardiac: Regular rate, normal heart rate, non-emergent blood pressure for this morning's vitals. Lungs: No increased work of breathing.  Equal chest rise appreciated Psych: Calm, asleep in bed.  ED Course / MDM  EKG:EKG Interpretation  Date/Time:  Thursday January 17 2022 09:25:31 EDT Ventricular Rate:  86 PR Interval:  152 QRS Duration: 84 QT Interval:  392 QTC Calculation: 469 R Axis:   0 Text Interpretation: Normal sinus rhythm Normal ECG When compared with ECG of 28-Oct-2021 10:02, PREVIOUS ECG IS PRESENT No significant change since last tracing Confirmed by 30-Oct-2021 [phone]) on 01/17/2022 10:01:57 AM  I have reviewed the labs performed to date as well as medications administered while in observation.    Plan  Current plan is for psychiatric placement.  Charles Estes is not under involuntary commitment.     Yetta Barre, MD 01/18/22 [phone]

## 2022-01-18 NOTE — ED Notes (Signed)
Merwyn Hodapp brother [phone] requesting to speak to the patient

## 2022-01-18 NOTE — Group Note (Signed)
LCSW Group Therapy Note   Group Date: 01/18/2022 Start Time: 1300 End Time: 1400   Type of Therapy and Topic:  Group Therapy:   Participation Level:  Did Not Attend   Description of Group:   Recognizing Triggers: Patients defined triggers and discussed the importance of recognizing their personal warning signs. Patients identified their own triggers and how they tend to cope with stressful situations. Patients discussed areas such as people, places, things, and thoughts that rigger certain emotions for them. CSW provided support to patients and discussed safety planning for when these triggers occur. Group participants had opportunities to share openly with the group and participate in a group discussion while providing support and feedback to their peers.  Therapeutic Goals: Patient will identify triggers that are contributing to a problem in their life Patient will identify unwanted behaviors and feelings associated with a trigger.  Patient will share with other group members strategies to confront and avoid triggers so that they may be able to react appropriately to triggers in daily life.    Summary of Patient Progress:  Did not attend    Therapeutic Modalities:   Cognitive Behavioral Therapy Solution Focused Therapy Motivational Interviewing Family Systems Approach   Summary of Patient Progress:    Did not attend  Therapeutic Modalities:   Marinda Elk, LCSW 01/18/2022  2:22 PM

## 2022-01-18 NOTE — ED Notes (Signed)
Snack bag provided with drink
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
## 2022-01-19 ENCOUNTER — Encounter (HOSPITAL_COMMUNITY): Payer: Self-pay | Admitting: Family

## 2022-01-19 DIAGNOSIS — I10 Essential (primary) hypertension: Secondary | ICD-10-CM

## 2022-01-19 DIAGNOSIS — F1410 Cocaine abuse, uncomplicated: Secondary | ICD-10-CM | POA: Diagnosis present

## 2022-01-19 DIAGNOSIS — F411 Generalized anxiety disorder: Secondary | ICD-10-CM

## 2022-01-19 DIAGNOSIS — Z789 Other specified health status: Secondary | ICD-10-CM

## 2022-01-19 LAB — URINALYSIS, COMPLETE (UACMP) WITH MICROSCOPIC
Bacteria, UA: NONE SEEN
Bilirubin Urine: NEGATIVE
Glucose, UA: NEGATIVE mg/dL
Hgb urine dipstick: NEGATIVE
Ketones, ur: NEGATIVE mg/dL
Leukocytes,Ua: NEGATIVE
Nitrite: NEGATIVE
Protein, ur: NEGATIVE mg/dL
Specific Gravity, Urine: 1.019 (ref 1.005–1.030)
pH: 5 (ref 5.0–8.0)

## 2022-01-19 MED ORDER — HYDROXYZINE HCL 25 MG PO TABS
25.0000 mg | ORAL_TABLET | Freq: Four times a day (QID) | ORAL | Status: AC | PRN
  Administered 2022-01-21: 25 mg via ORAL
  Filled 2022-01-19 (×2): qty 1

## 2022-01-19 MED ORDER — OLANZAPINE 2.5 MG PO TABS: 2.5000 mg | ORAL_TABLET | Freq: Three times a day (TID) | ORAL | Status: DC | PRN

## 2022-01-19 MED ORDER — QUETIAPINE FUMARATE 100 MG PO TABS
100.0000 mg | ORAL_TABLET | Freq: Every day | ORAL | Status: DC
  Administered 2022-01-19: 100 mg via ORAL
  Filled 2022-01-19 (×3): qty 1

## 2022-01-19 MED ORDER — ONDANSETRON 4 MG PO TBDP: 4.0000 mg | ORAL_TABLET | Freq: Four times a day (QID) | ORAL | Status: AC | PRN

## 2022-01-19 MED ORDER — LORAZEPAM 0.5 MG PO TABS: 0.5000 mg | ORAL_TABLET | Freq: Three times a day (TID) | ORAL | Status: DC | PRN

## 2022-01-19 MED ORDER — LOPERAMIDE HCL 2 MG PO CAPS: 2.0000 mg | ORAL_CAPSULE | ORAL | Status: AC | PRN

## 2022-01-19 MED ORDER — POTASSIUM CHLORIDE CRYS ER 20 MEQ PO TBCR
40.0000 meq | EXTENDED_RELEASE_TABLET | Freq: Once | ORAL | Status: AC
  Administered 2022-01-19: 40 meq via ORAL
  Filled 2022-01-19 (×2): qty 2

## 2022-01-19 MED ORDER — NICOTINE 14 MG/24HR TD PT24
14.0000 mg | MEDICATED_PATCH | Freq: Every day | TRANSDERMAL | Status: DC
  Filled 2022-01-19 (×3): qty 1

## 2022-01-19 MED ORDER — ASPIRIN 81 MG PO CHEW
81.0000 mg | CHEWABLE_TABLET | Freq: Every day | ORAL | Status: DC
  Administered 2022-01-20 – 2022-01-24 (×5): 81 mg via ORAL
  Filled 2022-01-19 (×7): qty 1

## 2022-01-19 MED ORDER — THIAMINE HCL 100 MG PO TABS
100.0000 mg | ORAL_TABLET | Freq: Every day | ORAL | Status: DC
  Administered 2022-01-20 – 2022-01-24 (×5): 100 mg via ORAL
  Filled 2022-01-19 (×7): qty 1

## 2022-01-19 MED ORDER — ADULT MULTIVITAMIN W/MINERALS CH
1.0000 | ORAL_TABLET | Freq: Every day | ORAL | Status: DC
  Administered 2022-01-19 – 2022-01-24 (×6): 1 via ORAL
  Filled 2022-01-19 (×9): qty 1

## 2022-01-19 MED ORDER — LORAZEPAM 1 MG PO TABS
1.0000 mg | ORAL_TABLET | Freq: Four times a day (QID) | ORAL | Status: AC | PRN
  Administered 2022-01-21: 1 mg via ORAL
  Filled 2022-01-19: qty 1

## 2022-01-19 MED ORDER — AMLODIPINE BESYLATE 5 MG PO TABS
5.0000 mg | ORAL_TABLET | Freq: Every day | ORAL | Status: DC
  Administered 2022-01-20: 5 mg via ORAL
  Filled 2022-01-19 (×2): qty 1

## 2022-01-19 MED ORDER — MAGNESIUM HYDROXIDE 400 MG/5ML PO SUSP: 30.0000 mL | Freq: Every day | ORAL | Status: DC | PRN

## 2022-01-19 NOTE — Progress Notes (Signed)
   01/19/22 2308  Psych Admission Type (Psych Patients Only)  Admission Status Voluntary  Psychosocial Assessment  Patient Complaints Depression  Eye Contact Brief  Facial Expression Flat  Affect Appropriate to circumstance  Speech Logical/coherent  Interaction Minimal  Motor Activity Slow  Appearance/Hygiene In hospital gown  Behavior Characteristics Appropriate to situation  Mood Depressed  Thought Process  Coherency WDL  Content WDL  Delusions None reported or observed  Perception WDL  Hallucination None reported or observed  Judgment Impaired  Confusion None  Danger to Self  Current suicidal ideation? Denies  Self-Injurious Behavior No self-injurious ideation or behavior indicators observed or expressed   Agreement Not to Harm Self Yes  Description of Agreement verbal  Danger to Others  Danger to Others None reported or observed

## 2022-01-19 NOTE — [facility] Group Notes (Signed)
Psychoeducational Group Note    Date:  01/19/2022 Time: 1300-1400    Purpose of Group: . The group focus' on teaching patients on how to identify their needs and their Life Skills:  A group where two lists are made. What people need and what are things that we do that are unhealthy. The lists are developed by the patients and it is explained that we often do the actions that are not healthy to get our list of needs met.  Goal:: to develop the coping skills needed to get their needs met  Participation Level: did not attend  Paulino Rily

## 2022-01-19 NOTE — [facility] Group Notes (Signed)
Goals Group 01/19/2022   Group Focus: affirmation, clarity of thought, and goals/reality orientation Treatment Modality:  Psychoeducation Interventions utilized were assignment, group exercise, and support Purpose: To be able to understand and verbalize the reason for their admission to the hospital. To understand that the medication helps with their chemical imbalance but they also need to work on their choices in life. To be challenged to develop a list of 30 positives about themselves. Also introduce the concept that "feelings" are not reality.  Participation Level:  did not attend  Charles Estes A 
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
## 2022-01-19 NOTE — Plan of Care (Signed)
Patient observed mostly laying in bed throughout day and did not eat breakfast. Later this afternoon, patient stated he did not want to take the same night medications he received last night for agitation because it made him "very sleepy" during the day. Reviewed medications with patient and patient verbalized all understanding. Patient med compliant. UA completed. Patient denies SI and A/V/H with no plan/intent. Patient does endorse HI and states he does feel like hurting people at times but could not name anyone specifically. Patient stated he slept better after separating from roommate last night and verbalized no further concerns.    Problem: Education: Goal: Mental status will improve Outcome: Not Progressing   Problem: Education: Goal: Verbalization of understanding the information provided will improve Outcome: Progressing

## 2022-01-19 NOTE — Group Note (Signed)
  [facility] LCSW Group Therapy Note  Date/Time:  01/19/2022 10:00am-11:00am or 11:00am-12:00pm  Type of Therapy and Topic:  Group Therapy:  Self-Care after Hospitalization  Participation Level:  Did Not Attend   Description of Group This process group involved patients discussing how they plan to take care of themselves in a better manner when they get home from the hospital.  The group started with patients listing one healthy self-care they hope to engage in at discharge that they did not use prior to admission.  We discussed a variety of other means of self-care which had a large range from hygiene activities to eating to setting boundaries to participating in peer support groups.  The primary focus by CSW was to point out commonalities.  When there were participants who stated everyone else can get help, but they themselves are "beyond help," this was discussed in detail and this belief was gently challenged.  Finally, it was announced that immediately following group was to be "Hygiene Hour" where everyone would go to their rooms and take care of their personal hygiene.  This was met with wide acceptance.  Therapeutic Goals Patient will identify and describe one self-care activity to deliberately plan to use upon hospital discharge Patient will participate in generating additional ideas about healthy self-care options when they return to the community Patients will be supportive of one another and receive support from others Patients will be challenged to realize that they are not "beyond help" any more than other participants in the room are  Summary of Patient Progress:  The patient was invited to group, did not attend.   Therapeutic Modalities Brief Solution-Focused Therapy Psychoeducation   Selmer Dominion, LCSW 01/19/2022, 4:17 PM
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
## 2022-01-19 NOTE — Progress Notes (Signed)
D: Pt alert and oriented. Pt rates his anxiety/depression 7/10 at time of assessment. Pt denies experiencing any pain at this time. Pt denies experiencing any SI/HI, or AVH at this time.     A: Scheduled medications administered, Zyprexa 5 mg po prn given at 2238 for mild agitation, Ativan 1 mg po prn given at 2318 for increase in agitation. Support and encouragement provided. Frequent verbal contact made. Routine safety checks conducted q15 minutes.   R: No adverse drug reactions noted. Offered prns effective at reassessment. Pt verbally contracts for safety at this time. Pt complaint with medications. Pt interacts minimally with others on the unit. Pt remains safe at this time.

## 2022-01-20 LAB — LIPID PANEL
Cholesterol: 195 mg/dL (ref 0–200)
HDL: 38 mg/dL — ABNORMAL LOW (ref >40)
LDL Cholesterol: 134 mg/dL — ABNORMAL HIGH (ref 0–99)
Total CHOL/HDL Ratio: 5.1 RATIO
Triglycerides: 114 mg/dL (ref <150)
VLDL: 23 mg/dL (ref 0–40)

## 2022-01-20 LAB — BASIC METABOLIC PANEL
Anion gap: 4 — ABNORMAL LOW (ref 5–15)
BUN: 17 mg/dL (ref 6–20)
CO2: 24 mmol/L (ref 22–32)
Calcium: 9.2 mg/dL (ref 8.9–10.3)
Chloride: 114 mmol/L — ABNORMAL HIGH (ref 98–111)
Creatinine, Ser: 0.99 mg/dL (ref 0.61–1.24)
GFR, Estimated: >60 mL/min (ref >60)
Glucose, Bld: 113 mg/dL — ABNORMAL HIGH (ref 70–99)
Potassium: 4 mmol/L (ref 3.5–5.1)
Sodium: 142 mmol/L (ref 135–145)

## 2022-01-20 LAB — VITAMIN B12: Vitamin B-12: 280 pg/mL (ref 180–914)

## 2022-01-20 LAB — TSH: TSH: 1.668 u[IU]/mL (ref 0.350–4.500)

## 2022-01-20 MED ORDER — AMLODIPINE BESYLATE 5 MG PO TABS
10.0000 mg | ORAL_TABLET | Freq: Every day | ORAL | Status: DC
  Administered 2022-01-21: 10 mg via ORAL
  Filled 2022-01-20: qty 1
  Filled 2022-01-20: qty 2
  Filled 2022-01-20 (×3): qty 1

## 2022-01-20 MED ORDER — HYDROCORTISONE (PERIANAL) 2.5 % EX CREA
TOPICAL_CREAM | Freq: Two times a day (BID) | CUTANEOUS | Status: DC
  Filled 2022-01-20 (×3): qty 28.35

## 2022-01-20 MED ORDER — CLONIDINE HCL 0.1 MG PO TABS
0.1000 mg | ORAL_TABLET | Freq: Once | ORAL | Status: AC
  Administered 2022-01-20: 0.1 mg via ORAL
  Filled 2022-01-20 (×2): qty 1

## 2022-01-20 MED ORDER — QUETIAPINE FUMARATE 200 MG PO TABS
200.0000 mg | ORAL_TABLET | Freq: Every day | ORAL | Status: DC
  Administered 2022-01-20: 200 mg via ORAL
  Filled 2022-01-20 (×4): qty 1

## 2022-01-20 MED ORDER — ACETAMINOPHEN 325 MG PO TABS
650.0000 mg | ORAL_TABLET | Freq: Four times a day (QID) | ORAL | Status: DC | PRN
  Administered 2022-01-20 – 2022-01-24 (×5): 650 mg via ORAL
  Filled 2022-01-20 (×5): qty 2

## 2022-01-20 MED ORDER — AMLODIPINE BESYLATE 5 MG PO TABS
5.0000 mg | ORAL_TABLET | Freq: Once | ORAL | Status: AC
Start: 2022-01-20 — End: 2022-01-20
  Administered 2022-01-20: 5 mg via ORAL
  Filled 2022-01-20 (×2): qty 1

## 2022-01-20 NOTE — Progress Notes (Signed)
[facility] MD Progress Note  01/20/2022 11:23 AM Charles Estes  MRN:  [id number]  HPI:  Charles Estes is a 61 year old African American male with a history of schizoaffective d/o, bipolar d/o, reflux, hypertension, depression and cocaine use who presented to the [facility] ER with +SI & chest pain. Pt was medically cleared, and transferred voluntarily to this [facility] for treatment and stabilization of his mood.  24 hr chart review: Pt is seen in his room on the 300 hall for this encounter. Pt presents with a flat affect and depressed mood.  Her attention to personal hygiene and grooming is poor, eye contact is fair, speech is clear & coherent. Thoughts are organized, but with some illogical contents. Pt currently endorses +SI with a plan to [place] the rail [place] tracks and lie there and wait for a train to run him over.  He verbally contracts for safety while at this [facility], and states that he will not do anything here to self harm. He presents with paranoia, tactile hallucinations, and delusional thinking. He states that he was up all night fighting the devil. He says that he physically wrestled with the devil, and could not see him or see him, but could physically feel him. He denies HI/VH. He states that he feels as though the devil is out to get him.  Pt reports a poor sleep quality last night, and states that wrestling with the devil kept him awake all night. I talked about being too old to be going through depression, talked about his family being okay if he killed himself because he has life insurance, and talked about "having crazy thoughts about me."Empathy and active listening was given to pt, and reasons to keep living were identified. He reports a good appetite, and denies being in any physical pain, but reports feeling "wobbly". Pt is agreeable to an increase in the dose of his Seroquel to 200 mg starting tonight for management of his psychosis and mood. We will continue other medications as listed below. No  TD/EPS type symptoms found on assessment, and pt denies any feelings of stiffness. AIMS: 0.   Principal Problem: Bipolar disorder, curr episode depressed, severe, w/psychotic features (HCC) Diagnosis: Principal Problem:   Bipolar disorder, curr episode depressed, severe, w/psychotic features (HCC) Active Problems:   Cocaine abuse (HCC)   Alcohol use   Anxiety state   Hypertension  Total Time spent with patient: 20 minutes  Past Psychiatric History: As above  Past Medical History:  Past Medical History:  Diagnosis Date   Depression    GERD (gastroesophageal reflux disease)    Hypertension    Pathologic ulnar fracture with malunion    right    Past Surgical History:  Procedure Laterality Date   MANDIBLE FRACTURE SURGERY  2014   ORIF ULNAR FRACTURE Right 07/16/2017   Procedure: OPEN REDUCTION INTERNAL FIXATION (ORIF) RIGHT ULNA FRACTURE NONUNION;  Surgeon: Tarry Kos, MD;  Location: [facility];  Service: Orthopedics;  Laterality: Right;   Family History:  Family History  Problem Relation Age of Onset   Hypertension Mother    Heart attack Mother    Hypertension Father    Heart attack Father    Heart attack Sister    Hypertension Sister    Heart attack Sister    Hypertension Brother    Family Psychiatric  History: See H & P Social History:  Social History   Substance and Sexual Activity  Alcohol Use Yes   Comment: socially-  past weekend reports 10 beers     Social History   Substance and Sexual Activity  Drug Use Yes   Types: Cocaine   Comment: occasionally    Social History   Socioeconomic History   Marital status: Single    Spouse name: Not on file   Number of children: Not on file   Years of education: Not on file   Highest education level: Not on file  Occupational History   Not on file  Tobacco Use   Smoking status: Some Days    Types: Cigarettes   Smokeless tobacco: Never  Substance and Sexual Activity   Alcohol use: Yes     Comment: socially- past weekend reports 10 beers   Drug use: Yes    Types: Cocaine    Comment: occasionally   Sexual activity: Not on file  Other Topics Concern   Not on file  Social History Narrative   Not on file   Social Determinants of Health   Financial Resource Strain: Not on file  Food Insecurity: Not on file  Transportation Needs: Not on file  Physical Activity: Not on file  Stress: Not on file  Social Connections: Not on file   Additional Social History:     Sleep: Poor  Appetite:  Good  Current Medications: Current Facility-Administered Medications  Medication Dose Route Frequency Provider Last Rate Last Admin   amLODipine (NORVASC) tablet 5 mg  5 mg Oral Daily Mason Jim, Amy E, MD   5 mg at 01/20/22 0865   aspirin chewable tablet 81 mg  81 mg Oral Daily Comer Locket, MD   81 mg at 01/20/22 7846   hydrOXYzine (ATARAX) tablet 25 mg  25 mg Oral Q6H PRN Starleen Blue, NP       loperamide (IMODIUM) capsule 2-4 mg  2-4 mg Oral PRN Pippa Hanif, NP       LORazepam (ATIVAN) tablet 0.5 mg  0.5 mg Oral Q8H PRN Jeanet Lupe, NP       LORazepam (ATIVAN) tablet 1 mg  1 mg Oral Q6H PRN Yumi Insalaco, NP       magnesium hydroxide (MILK OF MAGNESIA) suspension 30 mL  30 mL Oral Daily PRN Bobbitt, Shalon E, NP       multivitamin with minerals tablet 1 tablet  1 tablet Oral Daily Starleen Blue, NP   1 tablet at 01/20/22 0808   OLANZapine (ZYPREXA) tablet 2.5 mg  2.5 mg Oral Q8H PRN Starleen Blue, NP       ondansetron (ZOFRAN-ODT) disintegrating tablet 4 mg  4 mg Oral Q6H PRN Febe Champa, NP       pantoprazole (PROTONIX) EC tablet 40 mg  40 mg Oral Daily Mason Jim, Amy E, MD   40 mg at 01/20/22 9629   QUEtiapine (SEROQUEL) tablet 200 mg  200 mg Oral QHS Kanyia Heaslip, NP       senna-docusate (Senokot-S) tablet 1 tablet  1 tablet Oral QHS PRN Comer Locket, MD   1 tablet at 01/18/22 1901   thiamine (VITAMIN B1) tablet 100 mg  100 mg Oral Daily Starleen Blue, NP    100 mg at 01/20/22 5284    Lab Results:  Results for orders placed or performed during the hospital encounter of 01/18/22 (from the past 48 hour(s))  Urinalysis, Complete w Microscopic Urine, Clean Catch     Status: None   Collection Time: 01/19/22  3:50 PM  Result Value Ref Range   Color, Urine YELLOW YELLOW   APPearance  CLEAR CLEAR   Specific Gravity, Urine 1.019 1.005 - 1.030   pH 5.0 5.0 - 8.0   Glucose, UA NEGATIVE NEGATIVE mg/dL   Hgb urine dipstick NEGATIVE NEGATIVE   Bilirubin Urine NEGATIVE NEGATIVE   Ketones, ur NEGATIVE NEGATIVE mg/dL   Protein, ur NEGATIVE NEGATIVE mg/dL   Nitrite NEGATIVE NEGATIVE   Leukocytes,Ua NEGATIVE NEGATIVE   RBC / HPF 0-5 0 - 5 RBC/hpf   WBC, UA 0-5 0 - 5 WBC/hpf   Bacteria, UA NONE SEEN NONE SEEN   Squamous Epithelial / LPF 0-5 0 - 5   Mucus PRESENT     Comment: Performed at [facility], [address], [place], [place] [address]  TSH     Status: None   Collection Time: 01/20/22  6:36 AM  Result Value Ref Range   TSH 1.668 0.350 - 4.500 uIU/mL    Comment: Performed by a 3rd Generation assay with a functional sensitivity of <=0.01 uIU/mL. Performed at [facility], [address], [place], [place] [address]   Lipid panel     Status: Abnormal   Collection Time: 01/20/22  6:36 AM  Result Value Ref Range   Cholesterol 195 0 - 200 mg/dL   Triglycerides 782 <423 mg/dL   HDL 38 (L) >53 mg/dL   Total CHOL/HDL Ratio 5.1 RATIO   VLDL 23 0 - 40 mg/dL   LDL Cholesterol 614 (H) 0 - 99 mg/dL    Comment:        Total Cholesterol/HDL:CHD Risk Coronary Heart Disease Risk Table                     Men   Women  1/2 Average Risk   3.4   3.3  Average Risk       5.0   4.4  2 X Average Risk   9.6   7.1  3 X Average Risk  23.4   11.0        Use the calculated Patient Ratio above and the CHD Risk Table to determine the patient's CHD Risk.        ATP III CLASSIFICATION (LDL):  <100     mg/dL   Optimal  431-540   mg/dL   Near or Above                    Optimal  130-159  mg/dL   Borderline  086-761  mg/dL   High  >950     mg/dL   Very High Performed at [facility], [address], [place], [place] [address]   Vitamin B12     Status: None   Collection Time: 01/20/22  6:36 AM  Result Value Ref Range   Vitamin B-12 280 180 - 914 pg/mL    Comment: (NOTE) This assay is not validated for testing neonatal or myeloproliferative syndrome specimens for Vitamin B12 levels. Performed at [facility], [address], [place], [place] [address]   Basic metabolic panel     Status: Abnormal   Collection Time: 01/20/22  6:36 AM  Result Value Ref Range   Sodium 142 135 - 145 mmol/L   Potassium 4.0 3.5 - 5.1 mmol/L   Chloride 114 (H) 98 - 111 mmol/L   CO2 24 22 - 32 mmol/L   Glucose, Bld 113 (H) 70 - 99 mg/dL    Comment: Glucose reference range applies only to samples taken after fasting for at least 8  hours.   BUN 17 6 - 20 mg/dL   Creatinine, Ser 1.610.99 0.61 - 1.24 mg/dL   Calcium 9.2 8.9 - 09.610.3 mg/dL   GFR, Estimated >04>60 >54>60 mL/min    Comment: (NOTE) Calculated using the CKD-EPI Creatinine Equation (2021)    Anion gap 4 (L) 5 - 15    Comment: Performed at [facility], [address] [place], [place] [address]    Blood Alcohol level:  Lab Results  Component Value Date   ETH <10 01/17/2022   ETH <10 10/28/2021    Metabolic Disorder Labs: No results found for: "HGBA1C", "MPG" No results found for: "PROLACTIN" Lab Results  Component Value Date   CHOL 195 01/20/2022   TRIG 114 01/20/2022   HDL 38 (L) 01/20/2022   CHOLHDL 5.1 01/20/2022   VLDL 23 01/20/2022   LDLCALC 134 (H) 01/20/2022    Physical Findings: AIMS: Facial and Oral Movements Muscles of Facial Expression: None, normal Lips and Perioral Area: None, normal Jaw: None, normal Tongue: None, normal,Extremity Movements Upper (arms, wrists, hands, fingers): None,  normal Lower (legs, knees, ankles, toes): None, normal, Trunk Movements Neck, shoulders, hips: None, normal, Overall Severity Severity of abnormal movements (highest score from questions above): None, normal Incapacitation due to abnormal movements: None, normal Patient's awareness of abnormal movements (rate only patient's report): No Awareness, Dental Status Current problems with teeth and/or dentures?: No Does patient usually wear dentures?: No  CIWA:  CIWA-Ar Total: 0 COWS:     Musculoskeletal: Strength & Muscle Tone: within normal limits Gait & Station: normal Patient leans: N/A  Psychiatric Specialty Exam:  Presentation  General Appearance: Disheveled  Eye Contact:Fair  Speech:Clear and Coherent  Speech Volume:Normal  Handedness:Right   Mood and Affect  Mood:Depressed  Affect:Congruent   Thought Process  Thought Processes:Coherent  Descriptions of Associations:Intact  Orientation:Full (Time, Place and Person)  Thought Content:Illogical  History of Schizophrenia/Schizoaffective disorder:Yes  Duration of Psychotic Symptoms:No data recorded Hallucinations:Hallucinations: Tactile Description of Auditory Hallucinations: fought and wrestled the devil last night Description of Visual Hallucinations: none today  Ideas of Reference:Paranoia  Suicidal Thoughts:Suicidal Thoughts: Yes, Active SI Active Intent and/or Plan: With Plan  Homicidal Thoughts:Homicidal Thoughts: No   Sensorium  Memory:Immediate Good  Judgment:Poor  Insight:Poor   Executive Functions  Concentration:Fair  Attention Span:Fair  Recall:Fair  Fund of Knowledge:Fair  Language:Fair  Psychomotor Activity  Psychomotor Activity:Psychomotor Activity: Normal  Assets  Assets:Communication Skills  Sleep  Sleep:Sleep: Poor  Physical Exam: Physical Exam Constitutional:      Appearance: He is obese.  HENT:     Head: Normocephalic.     Nose: Nose normal.  Eyes:      Pupils: Pupils are equal, round, and reactive to light.  Pulmonary:     Effort: Pulmonary effort is normal.  Musculoskeletal:        General: Normal range of motion.     Cervical back: Normal range of motion.  Neurological:     Mental Status: He is alert and oriented to person, place, and time.    Review of Systems  Constitutional: Negative.   HENT: Negative.    Eyes: Negative.   Respiratory: Negative.    Cardiovascular: Negative.   Gastrointestinal: Negative.   Genitourinary: Negative.   Musculoskeletal: Negative.   Skin: Negative.   Neurological: Negative.   Psychiatric/Behavioral:  Positive for depression, hallucinations, substance abuse and suicidal ideas. Negative for memory loss. The patient is nervous/anxious and has insomnia.    Blood pressure (!) 136/97, pulse Marland Kitchen(!)  107, temperature 98 F (36.7 C), temperature source Oral, resp. rate 20, height 5\' 6"  (1.676 m), weight 89.4 kg, SpO2 100 %. Body mass index is 31.8 kg/m. Treatment Plan Summary: Daily contact with patient to assess and evaluate symptoms and progress in treatment and Medication management   Observation Level/Precautions:  15 minute checks  Laboratory:  Labs reviewed   Psychotherapy:  Unit Group sessions  Medications:  See [facility]  Consultations:  To be determined   Discharge Concerns:  Safety, medication compliance, mood stability  Estimated LOS: 5-7 days  Other:  N/A    PLAN Safety and Monitoring: Voluntary admission to inpatient psychiatric unit for safety, stabilization and treatment Daily contact with patient to assess and evaluate symptoms and progress in treatment Patient's case to be discussed in multi-disciplinary team meeting Observation Level : q15 minute checks Vital signs: q12 hours Precautions: Safety   Long Term Goal(s): Improvement in symptoms so as ready for discharge   Short Term Goals: Ability to identify changes in lifestyle to reduce recurrence of condition will improve, Ability to  disclose and discuss suicidal ideas, Ability to demonstrate self-control will improve, Ability to identify and develop effective coping behaviors will improve, Compliance with prescribed medications will improve, and Ability to identify triggers associated with substance abuse/mental health issues will improve   Diagnoses Principal Problem:   Bipolar disorder, curr episode depressed, severe, w/psychotic features (HCC) Active Problems:   Cocaine abuse (HCC)   Alcohol use   Anxiety state   Hypertension   Bipolar d/o most recent episode depressed severe with psychotic features (R/O Schizoaffective d/o) -Increase Seroquel to 200 mg nightly (r/b/se/a to med including risk of developing TD/EPS, weight gain, and metabolic syndrome were discussed) - May need start of an antidepressant but will reassess as he metabolizes cocaine and as he starts Seroquel titration   Anxiety -Continue Hydroxyzine 25 mg every 6 hours PRN   R/o Alcohol use d/o  Stimulant use d/o - cocaine type -Continue Ativan 1mg  tabs every 6 hours PRN CIWA >10 - MVI and thiamine oral replacement - Encouraged residential rehab or [facility] - Protonix 40mg  daily for GI prophylaxis   HTN - Continue Norvasc 5mg  daily and ASA 81mg  daily   Agitation -Continue Zyprexa 2.5 mg/Ativan 0.5 mg-See MAR-Meds reduced in dosage due to daytime sedation with Zyprexa 5 mg & Ativan 1 mg    Other PRNS -Continue Tylenol 650 mg every 6 hours PRN for mild pain -Continue Maalox 30 mg every 4 hrs PRN for indigestion -Continue Imodium 2-4 mg as needed for diarrhea -Continue Milk of Magnesia as needed every 6 hrs for constipation -Continue Zofran disintegrating tabs every 6 hrs PRN for nausea    Labs Reviewed. EKG with QTC-469. K-3.1-ordered K 40 MEQ X 1 dose. Potassium level from 8/20 WNL at 4.0. Tox Screen +Cocaine.  UA WNL, Hemoglobin A1C pending. lipid panel with LDL elevated at 134, education provided on healthy food choices and exercise. TSH WNL.  Vitamins D level pending, B12 WNL & B1-Pending   Discharge Planning: Social work and case management to assist with discharge planning and identification of hospital follow-up needs prior to discharge Estimated LOS: 5-7 days Discharge Concerns: Need to establish a safety plan; Medication compliance and effectiveness Discharge Goals: Return home with outpatient referrals for mental health follow-up including medication management/psychotherapy  [facility], NP 01/20/2022, 11:23 AM

## 2022-01-20 NOTE — BHH Group Notes (Signed)
Adult Psychoeducational Group Note Date:  01/20/2022 Time:  2122-4825 Group Topic/Focus: PROGRESSIVE RELAXATION. A group where deep breathing is taught and tensing and relaxation muscle groups is used. Imagery is used as well.  Pts are asked to imagine 3 pillars that hold them up when they are not able to hold themselves up and to share that with the group.  Participation Level:  did not attend group  Dione Housekeeper

## 2022-01-20 NOTE — Plan of Care (Addendum)
Patient appears with flat affect and anxious at times. Patient reported poor sleep last night due to having nightmares and fighting with the devil all night. Patient complaining of feeling "dizzy" "drowsy." Patients BP noted to be elevated (160/111). BP rechecked with monitor (143/109) as well as manually (150/110) and was given 1x dose of clonidine along with other scheduled bp medication. Patient appeared very anxious at this time and stated "You don't know about fighting demon blood." Patient guided through deep breathing. Patient also reported head pain 7/10 and was given tylenol po prn which helped decrease patients pain. Patient also continues to endorse SI with plan (lying on railroad tracks) and HI (no specific person verbalized). Patient denies AVH. Patients latest ciwa score 1. Patient appears calmer, contracts for safety while on unit, and remains absent from any injuries.   Problem: Education: Goal: Ability to state activities that reduce stress will improve Outcome: Progressing   Problem: Education: Goal: Knowledge of disease or condition will improve Outcome: Progressing   Problem: Safety: Goal: Ability to remain free from injury will improve Outcome: Progressing

## 2022-01-20 NOTE — BHH Group Notes (Signed)
Adult Psychoeducational Group  Date:  01/20/2022 Time:  1300-1400  Group Topic/Focus: Continuation of the group from Saturday. Looking at the lists that were created and talking about what needs to be done with the homework of 30 positives about themselves.                                     Talking about taking their power back and helping themselves to develop a positive self esteem.      Participation Quality:  Appropriate  Affect:  Appropriate  Cognitive:  Oriented  Insight: Improving  Engagement in Group:  Engaged  Modes of Intervention:  Activity, Discussion, Education, and Support  Additional Comments:  Pt came to the group. Rates his energy at a 5/10. Slept a good part of the group.  Dione Housekeeper

## 2022-01-20 NOTE — BHH Counselor (Signed)
Clinical Social Work Note  Weekend CSW attempted to complete PSA with pt once yesterday morning 01/19/22 at 10:30am and pt declined stating "They just gave me medicine that makes me drowsy and now you want to talk to me?". CSW tried again on 01/20/22 at 10:45am and at 2:55pm, both times pt was sleeping and snoring loudly and could not be woken despite knocking on the door and verbally calling his name.  [address] [place], [place] 01/20/2022 2:57 PM

## 2022-01-20 NOTE — Group Note (Signed)
LCSW Group Therapy Note  01/20/2022      Type of Therapy and Topic:  Group Therapy: Gratitude  Participation Level:  Minimal   Description of Group:   In this group, patients shared and discussed the importance of acknowledging the elements in their lives for which they are grateful and how this can positively impact their mood.  The group discussed how bringing the positive elements of their lives to the forefront of their minds can help with recovery from any illness, physical or mental.  An exercise was done as a group in which a list was made of gratitude items in order to encourage participants to consider other potential positives in their lives.  Therapeutic Goals: Patients will identify one or more item for which they are grateful in each of 6 categories:  people, experience, thing, place, skill, and other. Patients will discuss how it is possible to seek out gratitude in even bad situations. Patients will explore other possible items of gratitude that they could remember.   Summary of Patient Progress:  The patient was only present for about 5 minutes, during which time he expressed gratitude for "you all here."   Therapeutic Modalities:   Solution-Focused Therapy Activity  Carloyn Jaeger Grossman-Orr, LCSW .

## 2022-01-20 NOTE — BHH Group Notes (Signed)
Pt did not attend group. 

## 2022-01-20 NOTE — Progress Notes (Signed)
Pt coughing frequently during night.

## 2022-01-20 NOTE — Progress Notes (Signed)
   01/20/22 2313  Psych Admission Type (Psych Patients Only)  Admission Status Voluntary  Psychosocial Assessment  Patient Complaints Anxiety;Sleep disturbance  Eye Contact Fair  Facial Expression Animated  Affect Appropriate to circumstance  Speech Logical/coherent  Interaction Assertive  Motor Activity Other (Comment) (WDL)  Appearance/Hygiene Disheveled  Behavior Characteristics Appropriate to situation  Mood Pleasant  Thought Process  Coherency WDL  Content WDL  Delusions None reported or observed  Perception WDL  Hallucination None reported or observed  Judgment Poor  Confusion None  Danger to Self  Current suicidal ideation? Denies  Self-Injurious Behavior No self-injurious ideation or behavior indicators observed or expressed   Agreement Not to Harm Self Yes  Description of Agreement verbal  Danger to Others  Danger to Others None reported or observed

## 2022-01-20 NOTE — Progress Notes (Signed)
Pt did not attend wrap-up group   

## 2022-01-21 ENCOUNTER — Encounter (HOSPITAL_COMMUNITY): Payer: Self-pay

## 2022-01-21 LAB — HEMOGLOBIN A1C
Hgb A1c MFr Bld: 6 % — ABNORMAL HIGH (ref 4.8–5.6)
Mean Plasma Glucose: 125.5 mg/dL

## 2022-01-21 LAB — VITAMIN D 25 HYDROXY (VIT D DEFICIENCY, FRACTURES): Vit D, 25-Hydroxy: 21.89 ng/mL — ABNORMAL LOW (ref 30–100)

## 2022-01-21 MED ORDER — FLUOXETINE HCL 10 MG PO CAPS
10.0000 mg | ORAL_CAPSULE | Freq: Every day | ORAL | Status: DC
  Administered 2022-01-23 – 2022-01-24 (×2): 10 mg via ORAL
  Filled 2022-01-21 (×4): qty 1

## 2022-01-21 MED ORDER — QUETIAPINE FUMARATE 300 MG PO TABS
300.0000 mg | ORAL_TABLET | Freq: Every day | ORAL | Status: DC
  Administered 2022-01-21: 300 mg via ORAL
  Filled 2022-01-21 (×2): qty 1

## 2022-01-21 MED ORDER — MAGNESIUM CITRATE PO SOLN
1.0000 | Freq: Once | ORAL | Status: DC
  Filled 2022-01-21: qty 296

## 2022-01-21 MED ORDER — VITAMIN D (ERGOCALCIFEROL) 1.25 MG (50000 UNIT) PO CAPS
50000.0000 [IU] | ORAL_CAPSULE | ORAL | Status: DC
Start: 2022-01-21 — End: 2022-01-24
  Administered 2022-01-21: 50000 [IU] via ORAL
  Filled 2022-01-21: qty 1

## 2022-01-21 NOTE — Progress Notes (Addendum)
[facility] MD Progress Note  01/21/2022 1:03 PM Charles Estes  MRN:  [id number]  HPI:  Charles Estes is a 61 year old African American male with a history of schizoaffective d/o, bipolar d/o, reflux, hypertension, depression and cocaine use who presented to the [facility] ER with +SI & chest pain. Pt was medically cleared, and transferred voluntarily to this [facility] for treatment and stabilization of his mood.  24 hr chart review: V/S slightly elevated over the past 24 hrs, but  improved significantly as compared to a day prior. HR from earlier today morning slightly elevated at 106. As per nursing documentation, pt has not attended group session since yesterday. As per nursing documentation, he has been anxious & disheveled over the past 24 hrs. Sleep hours not recorded.  Today's patient assessment note: Today, pt presents with a flat affect and depressed mood, attention to personal hygiene and grooming is fair, eye contact is good, speech is clear & coherent. Thoughts are organized and logical, but pt continues to present with some illogical thought contents. Pt currently endorses +SI, states that his plan is to take a police officer's gun and use that to shoot himself, or lay down in a railroad tracks and get run over by a train. He verbally contracts for safety while hospitalized at this [facility].   Pt reports tactile hallucinations; states that he felt the devil fighting him, and he was "running away from the devil all night", and feels tired today due to that. He denies AH today and states that last time that he heard voices was last Wednesday. He currently denies HI/VH. He states that everyone knows what he is thinking about "because they are always watching me." Pt reports a poor sleep quality, but reports a good appetite. He denies any current medication related side effects, but reports beng constipated to his assigned RN. We will Mg Citrate x 1 dose for constipation, and will increase dose of Seroquel to 300  mg nightly for mood and psychosis. We will continue other medications as listed below. No TD/EPS type symptoms found on assessment, and pt denies any feelings of stiffness. AIMS: 0.   Principal Problem: Bipolar disorder, curr episode depressed, severe, w/psychotic features (HCC) Diagnosis: Principal Problem:   Bipolar disorder, curr episode depressed, severe, w/psychotic features (HCC) Active Problems:   Cocaine abuse (HCC)   Alcohol use   Anxiety state   Hypertension  Total Time spent with patient: 20 minutes  Past Psychiatric History: As above  Past Medical History:  Past Medical History:  Diagnosis Date   Depression    GERD (gastroesophageal reflux disease)    Hypertension    Pathologic ulnar fracture with malunion    right    Past Surgical History:  Procedure Laterality Date   MANDIBLE FRACTURE SURGERY  2014   ORIF ULNAR FRACTURE Right 07/16/2017   Procedure: OPEN REDUCTION INTERNAL FIXATION (ORIF) RIGHT ULNA FRACTURE NONUNION;  Surgeon: Tarry Kos, MD;  Location: [facility];  Service: Orthopedics;  Laterality: Right;   Family History:  Family History  Problem Relation Age of Onset   Hypertension Mother    Heart attack Mother    Hypertension Father    Heart attack Father    Heart attack Sister    Hypertension Sister    Heart attack Sister    Hypertension Brother    Family Psychiatric  History: See H & P Social History:  Social History   Substance and Sexual Activity  Alcohol Use Yes  Comment: socially- past weekend reports 10 beers     Social History   Substance and Sexual Activity  Drug Use Yes   Types: Cocaine   Comment: occasionally    Social History   Socioeconomic History   Marital status: Single    Spouse name: Not on file   Number of children: Not on file   Years of education: Not on file   Highest education level: Not on file  Occupational History   Not on file  Tobacco Use   Smoking status: Some Days    Types:  Cigarettes   Smokeless tobacco: Never  Substance and Sexual Activity   Alcohol use: Yes    Comment: socially- past weekend reports 10 beers   Drug use: Yes    Types: Cocaine    Comment: occasionally   Sexual activity: Not on file  Other Topics Concern   Not on file  Social History Narrative   Not on file   Social Determinants of Health   Financial Resource Strain: Not on file  Food Insecurity: Not on file  Transportation Needs: Not on file  Physical Activity: Not on file  Stress: Not on file  Social Connections: Not on file   Additional Social History:     Sleep: Poor  Appetite:  Good  Current Medications: Current Facility-Administered Medications  Medication Dose Route Frequency Provider Last Rate Last Admin   acetaminophen (TYLENOL) tablet 650 mg  650 mg Oral Q6H PRN Starleen Blue, NP   650 mg at 01/20/22 1820   amLODipine (NORVASC) tablet 10 mg  10 mg Oral Daily Starleen Blue, NP   10 mg at 01/21/22 0630   aspirin chewable tablet 81 mg  81 mg Oral Daily Comer Locket, MD   81 mg at 01/21/22 1601   hydrocortisone (ANUSOL-HC) 2.5 % rectal cream   Rectal BID Comer Locket, MD   Given at 01/21/22 1158   hydrOXYzine (ATARAX) tablet 25 mg  25 mg Oral Q6H PRN Starleen Blue, NP       loperamide (IMODIUM) capsule 2-4 mg  2-4 mg Oral PRN Nkwenti, Doris, NP       LORazepam (ATIVAN) tablet 0.5 mg  0.5 mg Oral Q8H PRN Nkwenti, Doris, NP       LORazepam (ATIVAN) tablet 1 mg  1 mg Oral Q6H PRN Nkwenti, Doris, NP       magnesium citrate solution 1 Bottle  1 Bottle Oral Once Nkwenti, Doris, NP       magnesium hydroxide (MILK OF MAGNESIA) suspension 30 mL  30 mL Oral Daily PRN Bobbitt, Shalon E, NP       multivitamin with minerals tablet 1 tablet  1 tablet Oral Daily Nkwenti, Doris, NP   1 tablet at 01/21/22 0805   OLANZapine (ZYPREXA) tablet 2.5 mg  2.5 mg Oral Q8H PRN Starleen Blue, NP       ondansetron (ZOFRAN-ODT) disintegrating tablet 4 mg  4 mg Oral Q6H PRN Nkwenti,  Doris, NP       pantoprazole (PROTONIX) EC tablet 40 mg  40 mg Oral Daily Mason Jim, Serigne Kubicek E, MD   40 mg at 01/21/22 0805   QUEtiapine (SEROQUEL) tablet 300 mg  300 mg Oral QHS Nkwenti, Doris, NP       senna-docusate (Senokot-S) tablet 1 tablet  1 tablet Oral QHS PRN Comer Locket, MD   1 tablet at 01/18/22 1901   thiamine (VITAMIN B1) tablet 100 mg  100 mg Oral Daily Starleen Blue, NP  100 mg at 01/21/22 3664    Lab Results:  Results for orders placed or performed during the hospital encounter of 01/18/22 (from the past 48 hour(s))  Urinalysis, Complete w Microscopic Urine, Clean Catch     Status: None   Collection Time: 01/19/22  3:50 PM  Result Value Ref Range   Color, Urine YELLOW YELLOW   APPearance CLEAR CLEAR   Specific Gravity, Urine 1.019 1.005 - 1.030   pH 5.0 5.0 - 8.0   Glucose, UA NEGATIVE NEGATIVE mg/dL   Hgb urine dipstick NEGATIVE NEGATIVE   Bilirubin Urine NEGATIVE NEGATIVE   Ketones, ur NEGATIVE NEGATIVE mg/dL   Protein, ur NEGATIVE NEGATIVE mg/dL   Nitrite NEGATIVE NEGATIVE   Leukocytes,Ua NEGATIVE NEGATIVE   RBC / HPF 0-5 0 - 5 RBC/hpf   WBC, UA 0-5 0 - 5 WBC/hpf   Bacteria, UA NONE SEEN NONE SEEN   Squamous Epithelial / LPF 0-5 0 - 5   Mucus PRESENT     Comment: Performed at [facility], [address], [place], [place] [address]  TSH     Status: None   Collection Time: 01/20/22  6:36 AM  Result Value Ref Range   TSH 1.668 0.350 - 4.500 uIU/mL    Comment: Performed by a 3rd Generation assay with a functional sensitivity of <=0.01 uIU/mL. Performed at [facility], [address], [place], [place] [address]   Lipid panel     Status: Abnormal   Collection Time: 01/20/22  6:36 AM  Result Value Ref Range   Cholesterol 195 0 - 200 mg/dL   Triglycerides 638 <756 mg/dL   HDL 38 (L) >43 mg/dL   Total CHOL/HDL Ratio 5.1 RATIO   VLDL 23 0 - 40 mg/dL   LDL Cholesterol 329 (H) 0 - 99 mg/dL    Comment:        Total  Cholesterol/HDL:CHD Risk Coronary Heart Disease Risk Table                     Men   Women  1/2 Average Risk   3.4   3.3  Average Risk       5.0   4.4  2 X Average Risk   9.6   7.1  3 X Average Risk  23.4   11.0        Use the calculated Patient Ratio above and the CHD Risk Table to determine the patient's CHD Risk.        ATP III CLASSIFICATION (LDL):  <100     mg/dL   Optimal  518-841  mg/dL   Near or Above                    Optimal  130-159  mg/dL   Borderline  660-630  mg/dL   High  >160     mg/dL   Very High Performed at [facility], [address], [place], [place] [address]   Vitamin B12     Status: None   Collection Time: 01/20/22  6:36 AM  Result Value Ref Range   Vitamin B-12 280 180 - 914 pg/mL    Comment: (NOTE) This assay is not validated for testing neonatal or myeloproliferative syndrome specimens for Vitamin B12 levels. Performed at [facility], [address] [place], [place] [address]   VITAMIN D 25 Hydroxy (Vit-D Deficiency, Fractures)     Status: Abnormal   Collection Time: 01/20/22  6:36 AM  Result Value Ref Range   Vit D, 25-Hydroxy 21.89 (L) 30 - 100 ng/mL    Comment: (NOTE) Vitamin D deficiency has been defined by the [place] practice guideline as a level of serum 25-OH  vitamin D less than 20 ng/mL (1,2). The Endocrine Society went on to  further define vitamin D insufficiency as a level between 21 and 29  ng/mL (2).  1. IOM (Institute of Medicine). 2010. Dietary reference intakes for  calcium and D. [place]: The [employer]. 2. Holick MF, Binkley Mona, Bischoff-Ferrari HA, et al. Evaluation,  treatment, and prevention of vitamin D deficiency: an Endocrine  Society clinical practice guideline, JCEM. 2011 Jul; 96(7): 1911-30.  Performed at [facility], [place] [address] [place], [place] [id number]   Basic metabolic panel     Status: Abnormal    Collection Time: 01/20/22  6:36 AM  Result Value Ref Range   Sodium 142 135 - 145 mmol/L   Potassium 4.0 3.5 - 5.1 mmol/L   Chloride 114 (H) 98 - 111 mmol/L   CO2 24 22 - 32 mmol/L   Glucose, Bld 113 (H) 70 - 99 mg/dL    Comment: Glucose reference range applies only to samples taken after fasting for at least 8 hours.   BUN 17 6 - 20 mg/dL   Creatinine, Ser 0.99 0.61 - 1.24 mg/dL   Calcium 9.2 8.9 - 10.3 mg/dL   GFR, Estimated >60 >60 mL/min    Comment: (NOTE) Calculated using the CKD-EPI Creatinine Equation (2021)    Anion gap 4 (L) 5 - 15    Comment: Performed at [facility], [place] [address], [place], [place] [address]  Hemoglobin A1c     Status: Abnormal   Collection Time: 01/21/22  6:27 AM  Result Value Ref Range   Hgb A1c MFr Bld 6.0 (H) 4.8 - 5.6 %    Comment: (NOTE) Pre diabetes:          5.7%-6.4%  Diabetes:              >6.4%  Glycemic control for   <7.0% adults with diabetes    Mean Plasma Glucose 125.5 mg/dL    Comment: Performed at [place] [address], [place], [place] [address]    Blood Alcohol level:  Lab Results  Component Value Date   [facility] <10 01/17/2022   ETH <10 [id number]    Metabolic Disorder Labs: Lab Results  Component Value Date   HGBA1C 6.0 (H) 01/21/2022   MPG 125.5 01/21/2022   No results found for: "PROLACTIN" Lab Results  Component Value Date   CHOL 195 01/20/2022   TRIG 114 01/20/2022   HDL 38 (L) 01/20/2022   CHOLHDL 5.1 01/20/2022   VLDL 23 01/20/2022   LDLCALC 134 (H) 01/20/2022    Physical Findings: AIMS: Facial and Oral Movements Muscles of Facial Expression: None, normal Lips and Perioral Area: None, normal Jaw: None, normal Tongue: None, normal,Extremity Movements Upper (arms, wrists, hands, fingers): None, normal Lower (legs, knees, ankles, toes): None, normal, Trunk Movements Neck, shoulders, hips: None, normal, Overall Severity Severity of abnormal movements (highest score from  questions above): None, normal Incapacitation due to abnormal movements: None, normal Patient's awareness of abnormal movements (rate only patient's report): No Awareness, Dental Status Current problems with teeth and/or dentures?: No Does patient usually wear dentures?: No  CIWA:  CIWA-Ar Total: 0 COWS:  Musculoskeletal: Strength & Muscle Tone: within normal limits Gait & Station: normal Patient leans: N/A  Psychiatric Specialty Exam:  Presentation  General Appearance: Disheveled  Eye Contact:Fair  Speech:Clear and Coherent  Speech Volume:Normal  Handedness:Right   Mood and Affect  Mood:Depressed  Affect:Congruent   Thought Process  Thought Processes:Coherent  Descriptions of Associations:Intact  Orientation:Full (Time, Place and Person)  Thought Content:Illogical  History of Schizophrenia/Schizoaffective disorder:Yes  Duration of Psychotic Symptoms:No data recorded Hallucinations:Hallucinations: None Description of Auditory Hallucinations: fought and wrestled the devil last night Description of Visual Hallucinations: none today  Ideas of Reference:Delusions  Suicidal Thoughts:Suicidal Thoughts: Yes, Active SI Active Intent and/or Plan: With Plan (contracts for safety on the unit)  Homicidal Thoughts:Homicidal Thoughts: No   Sensorium  Memory:Immediate Good  Judgment:Fair  Insight:Fair   Executive Functions  Concentration:Fair  Attention Span:Fair  [place]  Psychomotor Activity  Psychomotor Activity:Psychomotor Activity: Normal  Assets  Assets:Housing; Social Support  Sleep  Sleep:Sleep: Poor  Physical Exam: Physical Exam Constitutional:      Appearance: He is obese.  HENT:     Head: Normocephalic.     Nose: Nose normal.  Eyes:     Pupils: Pupils are equal, round, and reactive to light.  Pulmonary:     Effort: Pulmonary effort is normal.  Musculoskeletal:        General:  Normal range of motion.     Cervical back: Normal range of motion.  Neurological:     Mental Status: He is alert and oriented to person, place, and time.    Review of Systems  Constitutional: Negative.   HENT: Negative.    Eyes: Negative.   Respiratory: Negative.    Cardiovascular: Negative.   Gastrointestinal: Negative.   Genitourinary: Negative.   Musculoskeletal: Negative.   Skin: Negative.   Neurological: Negative.   Psychiatric/Behavioral:  Positive for depression, hallucinations, substance abuse and suicidal ideas. Negative for memory loss. The patient is nervous/anxious and has insomnia.    Blood pressure (!) 127/93, pulse (!) 106, temperature 98.2 F (36.8 C), temperature source Oral, resp. rate 16, height 5\' 6"  (1.676 m), weight 89.4 kg, SpO2 96 %. Body mass index is 31.8 kg/m. Treatment Plan Summary: Daily contact with patient to assess and evaluate symptoms and progress in treatment and Medication management   Observation Level/Precautions:  15 minute checks  Laboratory:  Labs reviewed   Psychotherapy:  Unit Group sessions  Medications:  See [facility]  Consultations:  To be determined   Discharge Concerns:  Safety, medication compliance, mood stability  Estimated LOS: 5-7 days  Other:  N/A    PLAN Safety and Monitoring: Voluntary admission to inpatient psychiatric unit for safety, stabilization and treatment Daily contact with patient to assess and evaluate symptoms and progress in treatment Patient's case to be discussed in multi-disciplinary team meeting Observation Level : q15 minute checks Vital signs: q12 hours Precautions: Safety   Long Term Goal(s): Improvement in symptoms so as ready for discharge   Short Term Goals: Ability to identify changes in lifestyle to reduce recurrence of condition will improve, Ability to disclose and discuss suicidal ideas, Ability to demonstrate self-control will improve, Ability to identify and develop effective coping behaviors  will improve, Compliance with prescribed medications will improve, and Ability to identify triggers associated with substance abuse/mental health issues will improve   Diagnoses Principal Problem:   Bipolar disorder, curr episode depressed, severe, w/psychotic features ([place]) Active Problems:   Cocaine abuse ([place])   Alcohol  use   Anxiety state   Hypertension   Bipolar d/o most recent episode depressed severe with psychotic features (R/O Schizoaffective d/o) -Increase Seroquel to 300 mg nightly (A1c 6.0, Lipids WNL other than HDL 38 and LDL 134, QTC 428ms) - Start Prozac 10mg  tomorrow and monitor for mood escalation on antidepressant   Anxiety -Continue Hydroxyzine 25 mg every 6 hours PRN   R/o Alcohol use d/o  Stimulant use d/o - cocaine type -Continue Ativan 1mg  tabs every 6 hours PRN CIWA >10 - MVI and thiamine oral replacement - Encouraged residential rehab or SAIOP - Protonix 40mg  daily for GI prophylaxis   HTN - Continue Norvasc 5mg  daily and ASA 81mg  daily  Constipation -Give Mg Citrate x 1 dose - Anusol cream PRN for rectal pain   Low Vit D - Start Vit D 50,000IU weekly  HbGA1c 6.0/LDL 134 - will need monitoring while on Seroquel and f/u with PCP for management   Agitation -Continue Zyprexa 2.5 mg/Ativan 0.5 mg-See MAR-Meds reduced in dosage due to daytime sedation with Zyprexa 5 mg & Ativan 1 mg    Other PRNS -Continue Tylenol 650 mg every 6 hours PRN for mild pain -Continue Maalox 30 mg every 4 hrs PRN for indigestion -Continue Imodium 2-4 mg as needed for diarrhea -Continue Milk of Magnesia as needed every 6 hrs for constipation -Continue Zofran disintegrating tabs every 6 hrs PRN for nausea    Labs Reviewed. EKG with QTC-469. K-3.1-ordered K 40 MEQ X 1 dose. Potassium level from 8/20 WNL at 4.0. Tox Screen +Cocaine.  UA WNL, Hemoglobin A1C pending. lipid panel with LDL elevated at 134, education provided on healthy food choices and exercise. TSH WNL. Vitamins  D level low, B12 WNL & B1-Pending   Discharge Planning: Social work and case management to assist with discharge planning and identification of hospital follow-up needs prior to discharge Estimated LOS: 5-7 days Discharge Concerns: Need to establish a safety plan; Medication compliance and effectiveness Discharge Goals: Return home with outpatient referrals for mental health follow-up including medication management/psychotherapy  Charles Rough, NP 01/21/2022, 1:03 PMPatient ID: Charles Estes, male   DOB: [birth year], 61 y.o.   MRN: [id number]
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
## 2022-01-21 NOTE — Plan of Care (Signed)
Nurse and staff have talked to patient throughout the day about his future and his family.

## 2022-01-21 NOTE — Progress Notes (Signed)
D:  Patient stated "After discharge I don't know if I am suicidal.  I'd rather be dead if the same.  Slept a little last night.  Problems fighting the devil at night.  Voices tell me life has  been pure hell. I can't handle things like I did when I was younger." Denied SI while patient at [facility], contracts for safety at [facility].   A:  Medications administered per MD orders.  Emotional support and encouragement given patient. R:  Safety maintained with 15 minute checks.

## 2022-01-21 NOTE — Group Note (Signed)
Recreation Therapy Group Note   Group Topic:Stress Management  Group Date: 01/21/2022 Start Time: 0930 End Time: 0945 Facilitators: Caroll Rancher, [place] Location: [address]   Goal Area(s) Addresses:  Patient will identify positive stress management techniques. Patient will identify benefits of using stress management post d/c.  Group Description:  Meditation.  LRT played patients a meditation that focused on being grounded, grateful and intentional.  Patients were to follow along practicing their breathing and allowing it to relax them while visualizing the positive intentions they wish to have throughout the day.    Affect/Mood: N/A   Participation Level: Did not attend    Clinical Observations/Individualized Feedback:     Plan: Continue to engage patient in RT group sessions 2-3x/week.   Caroll Rancher, LRT,CTRS 01/21/2022 11:19 AM

## 2022-01-21 NOTE — Progress Notes (Signed)
Adult Psychoeducational Group Note  Date:  01/21/2022 Time:  8:23 PM  Group Topic/Focus:  Wrap-Up Group:   The focus of this group is to help patients review their daily goal of treatment and discuss progress on daily workbooks.  Participation Level:  Active  Participation Quality:  Appropriate  Affect:  Appropriate  Cognitive:  Appropriate  Insight: Appropriate  Engagement in Group:  Engaged  Modes of Intervention:  Discussion  Additional Comments:  patient attend AA wrap up group  [name] Long 01/21/2022, 8:23 PM

## 2022-01-21 NOTE — BH IP Treatment Plan (Signed)
Interdisciplinary Treatment and Diagnostic Plan Update  01/21/2022 Time of Session: 9:30am  Charles Estes MRN: [id number]  Principal Diagnosis: Bipolar disorder, curr episode depressed, severe, w/psychotic features (HCC)  Secondary Diagnoses: Principal Problem:   Bipolar disorder, curr episode depressed, severe, w/psychotic features (HCC) Active Problems:   Cocaine abuse (HCC)   Alcohol use   Anxiety state   Hypertension   Current Medications:  Current Facility-Administered Medications  Medication Dose Route Frequency Provider Last Rate Last Admin   acetaminophen (TYLENOL) tablet 650 mg  650 mg Oral Q6H PRN Starleen Blue, NP   650 mg at 01/20/22 1820   amLODipine (NORVASC) tablet 10 mg  10 mg Oral Daily Starleen Blue, NP   10 mg at 01/21/22 7654   aspirin chewable tablet 81 mg  81 mg Oral Daily Comer Locket, MD   81 mg at 01/21/22 6503   hydrocortisone (ANUSOL-HC) 2.5 % rectal cream   Rectal BID Comer Locket, MD       hydrOXYzine (ATARAX) tablet 25 mg  25 mg Oral Q6H PRN Nkwenti, Doris, NP       loperamide (IMODIUM) capsule 2-4 mg  2-4 mg Oral PRN Nkwenti, Doris, NP       LORazepam (ATIVAN) tablet 0.5 mg  0.5 mg Oral Q8H PRN Nkwenti, Doris, NP       LORazepam (ATIVAN) tablet 1 mg  1 mg Oral Q6H PRN Nkwenti, Doris, NP       magnesium hydroxide (MILK OF MAGNESIA) suspension 30 mL  30 mL Oral Daily PRN Bobbitt, Shalon E, NP       multivitamin with minerals tablet 1 tablet  1 tablet Oral Daily Nkwenti, Doris, NP   1 tablet at 01/21/22 0805   OLANZapine (ZYPREXA) tablet 2.5 mg  2.5 mg Oral Q8H PRN Starleen Blue, NP       ondansetron (ZOFRAN-ODT) disintegrating tablet 4 mg  4 mg Oral Q6H PRN Nkwenti, Doris, NP       pantoprazole (PROTONIX) EC tablet 40 mg  40 mg Oral Daily Mason Jim, Amy E, MD   40 mg at 01/21/22 0805   QUEtiapine (SEROQUEL) tablet 200 mg  200 mg Oral QHS Nkwenti, Doris, NP   200 mg at 01/20/22 2114   senna-docusate (Senokot-S) tablet 1 tablet  1 tablet Oral QHS  PRN Comer Locket, MD   1 tablet at 01/18/22 1901   thiamine (VITAMIN B1) tablet 100 mg  100 mg Oral Daily Starleen Blue, NP   100 mg at 01/21/22 0806   PTA Medications: Medications Prior to Admission  Medication Sig Dispense Refill Last Dose   aspirin EC 81 MG tablet Take 81 mg by mouth daily. Swallow whole.      amLODipine (NORVASC) 5 MG tablet Take 5 mg by mouth daily.       Patient Stressors: Financial difficulties   Health problems   Marital or family conflict   Medication change or noncompliance   Substance abuse    Patient Strengths: Ability for insight  Active sense of humor  Average or above average intelligence  Capable of independent living  Communication skills  General fund of knowledge  Motivation for treatment/growth  Supportive family/friends   Treatment Modalities: Medication Management, Group therapy, Case management,  1 to 1 session with clinician, Psychoeducation, Recreational therapy.   Physician Treatment Plan for Primary Diagnosis: Bipolar disorder, curr episode depressed, severe, w/psychotic features (HCC) Long Term Goal(s): Improvement in symptoms so as ready for discharge   Short Term Goals: Ability to  identify changes in lifestyle to reduce recurrence of condition will improve Ability to disclose and discuss suicidal ideas Ability to demonstrate self-control will improve Ability to identify and develop effective coping behaviors will improve Compliance with prescribed medications will improve Ability to identify triggers associated with substance abuse/mental health issues will improve  Medication Management: Evaluate patient's response, side effects, and tolerance of medication regimen.  Therapeutic Interventions: 1 to 1 sessions, Unit Group sessions and Medication administration.  Evaluation of Outcomes: Progressing  Physician Treatment Plan for Secondary Diagnosis: Principal Problem:   Bipolar disorder, curr episode depressed, severe,  w/psychotic features (HCC) Active Problems:   Cocaine abuse (HCC)   Alcohol use   Anxiety state   Hypertension  Long Term Goal(s): Improvement in symptoms so as ready for discharge   Short Term Goals: Ability to identify changes in lifestyle to reduce recurrence of condition will improve Ability to disclose and discuss suicidal ideas Ability to demonstrate self-control will improve Ability to identify and develop effective coping behaviors will improve Compliance with prescribed medications will improve Ability to identify triggers associated with substance abuse/mental health issues will improve     Medication Management: Evaluate patient's response, side effects, and tolerance of medication regimen.  Therapeutic Interventions: 1 to 1 sessions, Unit Group sessions and Medication administration.  Evaluation of Outcomes: Progressing   RN Treatment Plan for Primary Diagnosis: Bipolar disorder, curr episode depressed, severe, w/psychotic features (HCC) Long Term Goal(s): Knowledge of disease and therapeutic regimen to maintain health will improve  Short Term Goals: Ability to remain free from injury will improve, Ability to verbalize frustration and anger appropriately will improve, Ability to demonstrate self-control, Ability to participate in decision making will improve, Ability to verbalize feelings will improve, Ability to disclose and discuss suicidal ideas, Ability to identify and develop effective coping behaviors will improve, and Compliance with prescribed medications will improve  Medication Management: RN will administer medications as ordered by provider, will assess and evaluate patient's response and provide education to patient for prescribed medication. RN will report any adverse and/or side effects to prescribing provider.  Therapeutic Interventions: 1 on 1 counseling sessions, Psychoeducation, Medication administration, Evaluate responses to treatment, Monitor vital  signs and CBGs as ordered, Perform/monitor CIWA, COWS, AIMS and Fall Risk screenings as ordered, Perform wound care treatments as ordered.  Evaluation of Outcomes: Progressing   LCSW Treatment Plan for Primary Diagnosis: Bipolar disorder, curr episode depressed, severe, w/psychotic features (HCC) Long Term Goal(s): Safe transition to appropriate next level of care at discharge, Engage patient in therapeutic group addressing interpersonal concerns.  Short Term Goals: Engage patient in aftercare planning with referrals and resources, Increase social support, Increase ability to appropriately verbalize feelings, Increase emotional regulation, Facilitate acceptance of mental health diagnosis and concerns, Facilitate patient progression through stages of change regarding substance use diagnoses and concerns, Identify triggers associated with mental health/substance abuse issues, and Increase skills for wellness and recovery  Therapeutic Interventions: Assess for all discharge needs, 1 to 1 time with Social worker, Explore available resources and support systems, Assess for adequacy in community support network, Educate family and significant other(s) on suicide prevention, Complete Psychosocial Assessment, Interpersonal group therapy.  Evaluation of Outcomes: Progressing   Progress in Treatment: Attending groups: No. Participating in groups: No. Taking medication as prescribed: Yes. Toleration medication: Yes. Family/Significant other contact made: No, will contact:  CSW will assess and identify social support Patient understands diagnosis: Yes. Discussing patient identified problems/goals with staff: Yes. Medical problems stabilized or resolved: Yes. Denies  suicidal/homicidal ideation: Yes. Issues/concerns per patient self-inventory: No.  New problem(s) identified: No, Describe:  none reported   New Short Term/Long Term Goal(s):  medication stabilization, elimination of SI thoughts,  development of comprehensive mental wellness plan.    Patient Goals:  Patient states, " I want to get back on my meds and not have side effects like dizziness.  I want to stop having suicidal thoughts"  Discharge Plan or Barriers: Patient recently admitted. CSW will continue to follow and assess for appropriate referrals and possible discharge planning.     Reason for Continuation of Hospitalization: Anxiety Depression Medication stabilization Suicidal ideation  Estimated Length of Stay: 3-7 days   Last 3 [place] Suicide Severity Risk Score: Flowsheet Row [facility] (Current) [facility] from 01/17/2022 in [facility] EM[facility]GENC[facility]PART[facility] ED from 10/28/2021 in [facility] EMERGENCY DEPARTMENT  C-SSRS RISK CATEGORY High Risk High Risk No Risk       Last PHQ 2/9 Scores:     No data to display          Scribe for Treatment Team: Beatris Si, LCSW 01/21/2022 11:33 AM
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
## 2022-01-21 NOTE — Progress Notes (Signed)
Patient stated he did not need mag citrate now.  That he had a good BM right after lunch.  That he might need this med later.

## 2022-01-22 ENCOUNTER — Inpatient Hospital Stay (HOSPITAL_COMMUNITY): Payer: Medicare Other

## 2022-01-22 ENCOUNTER — Other Ambulatory Visit: Payer: Self-pay

## 2022-01-22 LAB — CBC
HCT: 39.4 % (ref 39.0–52.0)
Hemoglobin: 12.8 g/dL — ABNORMAL LOW (ref 13.0–17.0)
MCH: 31.4 pg (ref 26.0–34.0)
MCHC: 32.5 g/dL (ref 30.0–36.0)
MCV: 96.6 fL (ref 80.0–100.0)
Platelets: 262 10*3/uL (ref 150–400)
RBC: 4.08 MIL/uL — ABNORMAL LOW (ref 4.22–5.81)
RDW: 13.8 % (ref 11.5–15.5)
WBC: 7.1 10*3/uL (ref 4.0–10.5)
nRBC: 0 % (ref 0.0–0.2)

## 2022-01-22 LAB — BASIC METABOLIC PANEL
Anion gap: 8 (ref 5–15)
BUN: 14 mg/dL (ref 6–20)
CO2: 27 mmol/L (ref 22–32)
Calcium: 9.3 mg/dL (ref 8.9–10.3)
Chloride: 106 mmol/L (ref 98–111)
Creatinine, Ser: 1.18 mg/dL (ref 0.61–1.24)
GFR, Estimated: >60 mL/min (ref >60)
Glucose, Bld: 108 mg/dL — ABNORMAL HIGH (ref 70–99)
Potassium: 4.4 mmol/L (ref 3.5–5.1)
Sodium: 141 mmol/L (ref 135–145)

## 2022-01-22 LAB — TROPONIN I (HIGH SENSITIVITY)
Troponin I (High Sensitivity): 6 ng/L (ref <18)
Troponin I (High Sensitivity): 5 ng/L (ref <18)

## 2022-01-22 MED ORDER — AMLODIPINE BESYLATE 5 MG PO TABS: 5.0000 mg | ORAL_TABLET | Freq: Once | ORAL | Status: DC

## 2022-01-22 MED ORDER — TRAZODONE HCL 50 MG PO TABS
50.0000 mg | ORAL_TABLET | Freq: Every evening | ORAL | Status: DC | PRN
Start: 2022-01-22 — End: 2022-01-23
  Administered 2022-01-22: 50 mg via ORAL
  Filled 2022-01-22: qty 1

## 2022-01-22 MED ORDER — IOHEXOL 350 MG/ML SOLN
100.0000 mL | Freq: Once | INTRAVENOUS | Status: AC | PRN
  Administered 2022-01-22: 100 mL via INTRAVENOUS

## 2022-01-22 MED ORDER — ARIPIPRAZOLE 5 MG PO TABS
5.0000 mg | ORAL_TABLET | Freq: Every day | ORAL | Status: DC
  Administered 2022-01-22 – 2022-01-23 (×2): 5 mg via ORAL
  Filled 2022-01-22 (×4): qty 1

## 2022-01-22 NOTE — Progress Notes (Signed)
PATIENT STATED HE HAS TO GO TO THE MD TOMORROW   [facility]  8/23  IN THE AFTERNOON HE THINKS.  MUST GO TO SEE THIS MD TO HAVE TESTS MADE ABOUT HIS BACK AND HIP PROBLEMS.  POSSIBLE SURGERY NEEDED ASAP.  PT HAS BEEN UPSET AND WORRIED ABOUT THIS MD APPOINTMENT THAT HE MIGHT MISS.    THIS APPOINTMENT IS IMPORTANT FOR HIS HEALTH AND STRENGTH FOR HIS BACK.  HAS BEEN HAVING SYMPTOMS FOR HIS BACK PROBLEM FOR MONTHS THAT CAUSES HIS BM AND URINE PROBLEMS.  THIS IS VERY IMPORTANT FOR PATIENT TO MAKE THIS APPOINTMENT ON WED  01/23/2022.

## 2022-01-22 NOTE — Progress Notes (Addendum)
Was notified patient with low BP this a.m. While trying to repeat vitals signs patient became restless and began to complain of left sided chest pain with radiation around to back. Warm and dry. Complains of nausea and asked for trash can to vomit in. He belched a couple of times and spit up small amount of clear liquid. A/C notified and EMS called. Assisted patient to bed and EKG done.  At 7:10 patient lying in bed and reports chest pain completely resolved. EMS here to transport patient to [facility]. Patient reports chest pain was a 10# and unlike chest pain or gas he has had in the past.Was very restless during this episode. Support and ressurance given.

## 2022-01-22 NOTE — Progress Notes (Signed)
D:  Patient's self inventory sheet, patient has fair sleep, sleep medication helpful.  Good appetite, low energy level, poor concentration.  Rated depression and hopeless 6,  anxiety 8.  Denied withdrawals.  Denied SI.  Back and L hip pain #8.  Goal is positive thinking, discharge home, grandchildren, MD appointment Wednesday.  Plans to live in car. A:  Medications administered per MD orders.  Emotional support and encouragement given patient. R:  Denied SI and HI, contracts for safety.  Denied A/V hallucinations this afternoon.  Patient went to [facility] ER this morning for chest pain.  EKG completed.   Patient returned from [facility] ER after lunch.  Stated he was feeling much better. Patient had called his family to tell them he went to [facility] ER.

## 2022-01-22 NOTE — Plan of Care (Signed)
Nurse discussed anxiety, depression and coping skills with patient.  

## 2022-01-22 NOTE — Progress Notes (Addendum)
   01/21/22 2000  Psychosocial Assessment  Patient Complaints Depression;Anxiety  Eye Contact Fair  Facial Expression Anxious  Affect Anxious;Depressed  Speech Logical/coherent  Interaction Assertive  Motor Activity Other (Comment) (WNL)  Appearance/Hygiene Unremarkable  Behavior Characteristics Cooperative;Anxious  Mood Depressed;Anxious;Pleasant  Thought Process  Coherency WDL  Content WDL  Delusions None reported or observed  Perception WDL  Hallucination None reported or observed  Judgment Limited  Confusion None  Danger to Self  Current suicidal ideation? Denies  Self-Injurious Behavior No self-injurious ideation or behavior indicators observed or expressed   Danger to Others  Danger to Others None reported or observed   Charles Estes rates his Depression a 7# and his Anxiety a 8#

## 2022-01-22 NOTE — BHH Group Notes (Signed)
Patient attended group

## 2022-01-22 NOTE — Progress Notes (Signed)
[facility] MD Progress Note  01/22/2022 6:29 PM Charles Estes  MRN:  [id number]  HPI:  Charles Estes is a 61 year old African American male with a history of schizoaffective d/o, bipolar d/o, reflux, hypertension, depression and cocaine use who presented to the [facility] ER with +SI & chest pain. Pt was medically cleared, and transferred voluntarily to this [facility] for treatment and stabilization of his mood.  24 hr chart review: Pt was transferred to the [facility] ER earlier today with c/o left sided chest pain. Pt was worked up medically and cleared prior to being transferred back to [facility]. V/S slightly elevated over the past 24 hrs, with highest being, earlier today afternoon while pt was at [facility] ER (142/116 and HR was 109). V/S were WNL prior to pt returning to [facility]. As per nursing documentation, pt has attended some unit group session in the past 24 hrs.  As per nursing documentation, he has continued to be noted as being anxious & disheveled. Sleep hours are documented as 7 last night.  Today's patient assessment note: Today, pt continues to present with a flat affect and depressed mood, attention to personal hygiene and grooming is poor, and the need to tend to personal hygiene and grooming reiterated.  Eye contact is good, speech is clear & coherent. Thoughts are organized and logical, and pt denies SI/HI/AVH, and denies paranoia. He reports a good night's sleep last night, and states that he "got rid of the devil". He reports a good appetite and is persistent about being discharged tomorrow. Pt has been educated on the 72 hr notice for discharge, but is argumentative stating that he "must leave tomorrow or I will be dangerous to everybody. It's a promise". We will continue other medications as listed below. No TD/EPS type symptoms found on assessment, and pt denies any feelings of stiffness. AIMS: 0.   Principal Problem: Bipolar disorder, curr episode depressed, severe, w/psychotic features  (HCC) Diagnosis: Principal Problem:   Bipolar disorder, curr episode depressed, severe, w/psychotic features (HCC) Active Problems:   Cocaine abuse (HCC)   Alcohol use   Anxiety state   Hypertension  Total Time spent with patient: 20 minutes  Past Psychiatric History: As above  Past Medical History:  Past Medical History:  Diagnosis Date   Depression    GERD (gastroesophageal reflux disease)    Hypertension    Pathologic ulnar fracture with malunion    right    Past Surgical History:  Procedure Laterality Date   MANDIBLE FRACTURE SURGERY  2014   ORIF ULNAR FRACTURE Right 07/16/2017   Procedure: OPEN REDUCTION INTERNAL FIXATION (ORIF) RIGHT ULNA FRACTURE NONUNION;  Surgeon: Tarry Kos, MD;  Location: [facility];  Service: Orthopedics;  Laterality: Right;   Family History:  Family History  Problem Relation Age of Onset   Hypertension Mother    Heart attack Mother    Hypertension Father    Heart attack Father    Heart attack Sister    Hypertension Sister    Heart attack Sister    Hypertension Brother    Family Psychiatric  History: See H & P Social History:  Social History   Substance and Sexual Activity  Alcohol Use Yes   Comment: socially- past weekend reports 10 beers     Social History   Substance and Sexual Activity  Drug Use Yes   Types: Cocaine   Comment: occasionally    Social History   Socioeconomic History   Marital status: Single  Spouse name: Not on file   Number of children: Not on file   Years of education: Not on file   Highest education level: Not on file  Occupational History   Not on file  Tobacco Use   Smoking status: Some Days    Types: Cigarettes   Smokeless tobacco: Never  Substance and Sexual Activity   Alcohol use: Yes    Comment: socially- past weekend reports 10 beers   Drug use: Yes    Types: Cocaine    Comment: occasionally   Sexual activity: Not on file  Other Topics Concern   Not on file   Social History Narrative   Not on file   Social Determinants of Health   Financial Resource Strain: Not on file  Food Insecurity: Not on file  Transportation Needs: Not on file  Physical Activity: Not on file  Stress: Not on file  Social Connections: Not on file   Additional Social History:     Sleep: Poor  Appetite:  Good  Current Medications: Current Facility-Administered Medications  Medication Dose Route Frequency Provider Last Rate Last Admin   acetaminophen (TYLENOL) tablet 650 mg  650 mg Oral Q6H PRN Starleen Blue, NP   650 mg at 01/21/22 1645   ARIPiprazole (ABILIFY) tablet 5 mg  5 mg Oral Daily Attiah, Nadir, MD   5 mg at 01/22/22 1816   aspirin chewable tablet 81 mg  81 mg Oral Daily Mason Jim, Amy E, MD   81 mg at 01/22/22 1337   FLUoxetine (PROZAC) capsule 10 mg  10 mg Oral Daily Mason Jim, Amy E, MD       hydrocortisone (ANUSOL-HC) 2.5 % rectal cream   Rectal BID Comer Locket, MD   Given at 01/22/22 1814   LORazepam (ATIVAN) tablet 0.5 mg  0.5 mg Oral Q8H PRN Starleen Blue, NP       magnesium citrate solution 1 Bottle  1 Bottle Oral Once Hermilo Dutter, NP       magnesium hydroxide (MILK OF MAGNESIA) suspension 30 mL  30 mL Oral Daily PRN Bobbitt, Shalon E, NP       multivitamin with minerals tablet 1 tablet  1 tablet Oral Daily Nica Friske, NP   1 tablet at 01/22/22 1338   OLANZapine (ZYPREXA) tablet 2.5 mg  2.5 mg Oral Q8H PRN Starleen Blue, NP       pantoprazole (PROTONIX) EC tablet 40 mg  40 mg Oral Daily Mason Jim, Amy E, MD   40 mg at 01/22/22 1338   senna-docusate (Senokot-S) tablet 1 tablet  1 tablet Oral QHS PRN Comer Locket, MD   1 tablet at 01/18/22 1901   thiamine (VITAMIN B1) tablet 100 mg  100 mg Oral Daily Starleen Blue, NP   100 mg at 01/22/22 1341   traZODone (DESYREL) tablet 50 mg  50 mg Oral QHS PRN Sarita Bottom, MD       Vitamin D (Ergocalciferol) (DRISDOL) 1.25 MG (50000 UNIT) capsule 50,000 Units  50,000 Units Oral Q7 days  Comer Locket, MD   50,000 Units at 01/21/22 1600    Lab Results:  Results for orders placed or performed during the hospital encounter of 01/18/22 (from the past 48 hour(s))  Hemoglobin A1c     Status: Abnormal   Collection Time: 01/21/22  6:27 AM  Result Value Ref Range   Hgb A1c MFr Bld 6.0 (H) 4.8 - 5.6 %    Comment: (NOTE) Pre diabetes:  5.7%-6.4%  Diabetes:              >6.4%  Glycemic control for   <7.0% adults with diabetes    Mean Plasma Glucose 125.5 mg/dL    Comment: Performed at [facility], [address], [place], [place] [address]  Basic metabolic panel     Status: Abnormal   Collection Time: 01/22/22  8:08 AM  Result Value Ref Range   Sodium 141 135 - 145 mmol/L   Potassium 4.4 3.5 - 5.1 mmol/L   Chloride 106 98 - 111 mmol/L   CO2 27 22 - 32 mmol/L   Glucose, Bld 108 (H) 70 - 99 mg/dL    Comment: Glucose reference range applies only to samples taken after fasting for at least 8 hours.   BUN 14 6 - 20 mg/dL   Creatinine, Ser 9.15 0.61 - 1.24 mg/dL   Calcium 9.3 8.9 - 05.6 mg/dL   GFR, Estimated >97 >94 mL/min    Comment: (NOTE) Calculated using the CKD-EPI Creatinine Equation (2021)    Anion gap 8 5 - 15    Comment: Performed at [facility], [address], [place], [place] [address]  CBC     Status: Abnormal   Collection Time: 01/22/22  8:08 AM  Result Value Ref Range   WBC 7.1 4.0 - 10.5 K/uL   RBC 4.08 (L) 4.22 - 5.81 MIL/uL   Hemoglobin 12.8 (L) 13.0 - 17.0 g/dL   HCT 53.7 48.2 - 70.7 %   MCV 96.6 80.0 - 100.0 fL   MCH 31.4 26.0 - 34.0 pg   MCHC 32.5 30.0 - 36.0 g/dL   RDW 86.7 54.4 - 92.0 %   Platelets 262 150 - 400 K/uL   nRBC 0.0 0.0 - 0.2 %    Comment: Performed at [facility], [address], [place], [place] [address]  Troponin I (High Sensitivity)     Status: None   Collection Time: 01/22/22  8:08 AM  Result Value Ref Range   Troponin I (High Sensitivity) 6 <18 ng/L    Comment: (NOTE) Elevated high  sensitivity troponin I (hsTnI) values and significant  changes across serial measurements may suggest ACS but many other  chronic and acute conditions are known to elevate hsTnI results.  Refer to the "Links" section for chest pain algorithms and additional  guidance. Performed at [facility], [address], [place], [place] [address]   Troponin I (High Sensitivity)     Status: None   Collection Time: 01/22/22 11:03 AM  Result Value Ref Range   Troponin I (High Sensitivity) 5 <18 ng/L    Comment: (NOTE) Elevated high sensitivity troponin I (hsTnI) values and significant  changes across serial measurements may suggest ACS but many other  chronic and acute conditions are known to elevate hsTnI results.  Refer to the "Links" section for chest pain algorithms and additional  guidance. Performed at [facility], [address], [place], [place] [address]     Blood Alcohol level:  Lab Results  Component Value Date   [facility] <10 01/17/2022   ETH <10 10/28/2021    Metabolic Disorder Labs: Lab Results  Component Value Date   HGBA1C 6.0 (H) 01/21/2022   MPG 125.5 01/21/2022   No results found for: "PROLACTIN" Lab Results  Component Value Date   CHOL 195 01/20/2022   TRIG 114 01/20/2022   HDL 38 (L) 01/20/2022   CHOLHDL 5.1 01/20/2022  VLDL 23 01/20/2022   LDLCALC 134 (H) 01/20/2022    Physical Findings: AIMS: Facial and Oral Movements Muscles of Facial Expression: None, normal Lips and Perioral Area: None, normal Jaw: None, normal Tongue: None, normal,Extremity Movements Upper (arms, wrists, hands, fingers): None, normal Lower (legs, knees, ankles, toes): None, normal, Trunk Movements Neck, shoulders, hips: None, normal, Overall Severity Severity of abnormal movements (highest score from questions above): None, normal Incapacitation due to abnormal movements: None, normal Patient's awareness of abnormal movements (rate only patient's report): No Awareness,  Dental Status Current problems with teeth and/or dentures?: No Does patient usually wear dentures?: No  CIWA:  CIWA-Ar Total: 8 COWS:     Musculoskeletal: Strength & Muscle Tone: within normal limits Gait & Station: normal Patient leans: N/A  Psychiatric Specialty Exam:  Presentation  General Appearance: Disheveled  Eye Contact:Fair  Speech:Clear and Coherent  Speech Volume:Normal  Handedness:Right   Mood and Affect  Mood:Euthymic; Depressed  Affect:Congruent   Thought Process  Thought Processes:Coherent  Descriptions of Associations:Intact  Orientation:Full (Time, Place and Person)  Thought Content:Logical  History of Schizophrenia/Schizoaffective disorder:Yes  Duration of Psychotic Symptoms:No data recorded Hallucinations:Hallucinations: None  Ideas of Reference:None  Suicidal Thoughts:Suicidal Thoughts: No SI Active Intent and/or Plan: With Plan (contracts for safety on the unit)  Homicidal Thoughts:Homicidal Thoughts: No   Sensorium  Memory:Immediate Good  Judgment:Fair  Insight:Fair   Executive Functions  Concentration:Fair  Attention Span:Fair  Recall:Fair  Fund of Knowledge:Fair  Language:Fair  Psychomotor Activity  Psychomotor Activity:Psychomotor Activity: Normal  Assets  Assets:Communication Skills  Sleep  Sleep:Sleep: Poor  Physical Exam: Physical Exam Constitutional:      Appearance: He is obese.  HENT:     Head: Normocephalic.     Nose: Nose normal.  Eyes:     Pupils: Pupils are equal, round, and reactive to light.  Pulmonary:     Effort: Pulmonary effort is normal.  Musculoskeletal:        General: Normal range of motion.     Cervical back: Normal range of motion.  Neurological:     Mental Status: He is alert and oriented to person, place, and time.    Review of Systems  Constitutional: Negative.   HENT: Negative.    Eyes: Negative.   Respiratory: Negative.    Cardiovascular: Negative.    Gastrointestinal: Negative.   Genitourinary: Negative.   Musculoskeletal: Negative.   Skin: Negative.   Neurological: Negative.   Psychiatric/Behavioral:  Positive for depression, hallucinations, substance abuse and suicidal ideas. Negative for memory loss. The patient is nervous/anxious and has insomnia.    Blood pressure 115/81, pulse 90, temperature 98.3 F (36.8 C), resp. rate 16, height 5\' 6"  (1.676 m), weight 89.4 kg, SpO2 97 %. Body mass index is 31.8 kg/m. Treatment Plan Summary: Daily contact with patient to assess and evaluate symptoms and progress in treatment and Medication management   Observation Level/Precautions:  15 minute checks  Laboratory:  Labs reviewed   Psychotherapy:  Unit Group sessions  Med[facility]:  [facility]  Consultations:  To be determined   Discharge Concerns:  Safety, medication compliance, mood stability  Estimated LOS: 5-7 days  Other:  N/A    PLAN Safety and Monitoring: Voluntary admission to inpatient psychiatric unit for safety, stabilization and treatment Daily contact with patient to assess and evaluate symptoms and progress in treatment Patient's case to be discussed in multi-disciplinary team meeting Observation Level : q15 minute checks Vital signs: q12 hours Precautions: Safety   Long Term Goal(s): Improvement  in symptoms so as ready for discharge   Short Term Goals: Ability to identify changes in lifestyle to reduce recurrence of condition will improve, Ability to disclose and discuss suicidal ideas, Ability to demonstrate self-control will improve, Ability to identify and develop effective coping behaviors will improve, Compliance with prescribed medications will improve, and Ability to identify triggers associated with substance abuse/mental health issues will improve   Diagnoses Principal Problem:   Bipolar disorder, curr episode depressed, severe, w/psychotic features (HCC) Active Problems:   Cocaine abuse (HCC)   Alcohol use    Anxiety state   Hypertension   Bipolar d/o most recent episode depressed severe with psychotic features (R/O Schizoaffective d/o) -Discontinue Seroquel 300 mg nightly d/t hypotension & dizziness (A1c 6.0, Lipids WNL other than HDL 38 and LDL 134, QTC ) - Continue Prozac 10mg  daily for depression -Start Abilify 5 mg daily for psychosis   Anxiety -Continue Hydroxyzine 25 mg every 6 hours PRN   R/o Alcohol use d/o  Stimulant use d/o - cocaine type -Continue Ativan 1mg  tabs every 6 hours PRN CIWA >10 - MVI and thiamine oral replacement - Encouraged residential rehab or SAIOP - Protonix 40mg  daily for GI prophylaxis  Insomnia -Start Trazodone 50 mg nightly PRN   HTN - Continue Norvasc 5mg  daily and ASA 81mg  daily  Constipation -Given Mg Citrate x 1 dose on 8/21 - Anusol cream PRN for rectal pain   Low Vit D - Continue Vit D 50,000IU weekly  HbGA1c 6.0/LDL 134 - will need monitoring while on antipsychotic and f/u with PCP for management   Agitation -Continue Zyprexa 2.5 mg/Ativan 0.5 mg-See MAR-Meds reduced in dosage due to daytime sedation with Zyprexa 5 mg & Ativan 1 mg    Other PRNS -Continue Tylenol 650 mg every 6 hours PRN for mild pain -Continue Maalox 30 mg every 4 hrs PRN for indigestion -Continue Imodium 2-4 mg as needed for diarrhea -Continue Milk of Magnesia as needed every 6 hrs for constipation -Continue Zofran disintegrating tabs every 6 hrs PRN for nausea    Labs Reviewed. EKG with QTC-469. K-3.1-ordered K 40 MEQ X 1 dose. Potassium level from 8/20 WNL at 4.0. Tox Screen +Cocaine.  UA WNL, Hemoglobin A1C pending. lipid panel with LDL elevated at 134, education provided on healthy food choices and exercise. TSH WNL. Vitamins D level low, B12 WNL & B1-Pending   Discharge Planning: Social work and case management to assist with discharge planning and identification of hospital follow-up needs prior to discharge Estimated LOS: 5-7 days Discharge Concerns:  Need to establish a safety plan; Medication compliance and effectiveness Discharge Goals: Return home with outpatient referrals for mental health follow-up including medication management/psychotherapy  , NP 01/22/2022, 6:29 PMPatient ID: , male   DOB: 1961/01/31, 61 y.o.   MRN: Starleen Blue Patient ID: [id number], male   DOB: 10-29-1960, 61 y.o.   MRN: [id number]

## 2022-01-22 NOTE — ED Notes (Signed)
Pt provided discharge instructions and prescription information. Pt was given the opportunity to ask questions and questions were answered.   Pt discharged back to [facility] with [facility]. BH sitter remains with pt.

## 2022-01-22 NOTE — ED Notes (Signed)
Pt states he is suicidal with a plan

## 2022-01-22 NOTE — Discharge Instructions (Signed)
You were evaluated in the ER for chest pain. Your work-up was negative for any acute heart attack or aneurysms. Please make sure to return if you have persistent chest pain with nausea/vomiting or significant sweating.  Make sure to keep an eye on your blood pressures as they were on the lower side when you first arrived in the ER. If you are having dizziness, the dose of your blood pressure medication may need to be adjusted.

## 2022-01-22 NOTE — ED Provider Notes (Signed)
[facility] EMERGENCY DEPARTMENT Provider Note   CSN: [id number] Arrival date & time: 01/22/22  0750     History No chief complaint on file.   Charles Estes is a 61 y.o. male with history of HTN, depression, and cocaine use presents for chest pain. Patient was brought in by EMS from [facility] as he had an episode of sharp left-sided chest pain that radiated to his back that lasted about 10 minutes and had nausea with a small amount of spit-up/vomit. Denies any shortness of breath or diaphoresis during that time or afterwards. Symptoms have not recurred and otherwise patient is feeling fine but hungry. No history of PE or cardiac disease.   Was seen for chest pain on 8/17 but noted cocaine use at that time too. Patient has been at [facility] since then so has not had access to any drugs. Patient remains suicidal with plan and will need to be discharged back to [facility].   HPI     Home Medications Prior to Admission medications   Medication Sig Start Date End Date Taking? Authorizing Provider  aspirin EC 81 MG tablet Take 81 mg by mouth daily. Swallow whole.   Yes [provider]  amLODipine (NORVASC) 5 MG tablet Take 5 mg by mouth daily. 11/26/21   [provider]  calcium-vitamin D (OSCAL WITH D) 500-200 MG-UNIT tablet Take 1 tablet by mouth 3 (three) times daily. Patient not taking: Reported on 11/20/2017 07/16/17 10/23/20  Tarry Kos, MD  promethazine (PHENERGAN) 25 MG tablet Take 1 tablet (25 mg total) by mouth every 6 (six) hours as needed for nausea. Patient not taking: Reported on 11/20/2017 07/16/17 10/23/20  Tarry Kos, MD      Allergies    Paxil [paroxetine]    Review of Systems   Review of Systems  Constitutional:  Negative for activity change, appetite change, chills, diaphoresis, fatigue and fever.  HENT:  Negative for congestion and trouble swallowing.   Eyes:  Negative for visual disturbance.  Respiratory:   Negative for chest tightness, shortness of breath and wheezing.   Cardiovascular:  Positive for chest pain. Negative for leg swelling.  Gastrointestinal:  Positive for nausea and vomiting. Negative for abdominal distention and constipation.  Genitourinary:  Negative for difficulty urinating.  Musculoskeletal:  Negative for arthralgias.  Neurological:  Negative for dizziness, weakness and light-headedness.    Physical Exam Updated Vital Signs BP (!) 144/92   Pulse 74   Temp 97.6 F (36.4 C) (Oral)   Resp 16   Ht 5\' 6"  (1.676 m)   Wt 89.4 kg   SpO2 98%   BMI 31.80 kg/m  Physical Exam Constitutional:      Appearance: Normal appearance.  HENT:     Head: Normocephalic and atraumatic.     Nose: Nose normal.     Mouth/Throat:     Mouth: Mucous membranes are dry.  Eyes:     Extraocular Movements: Extraocular movements intact.     Conjunctiva/sclera: Conjunctivae normal.     Pupils: Pupils are equal, round, and reactive to light.  Cardiovascular:     Rate and Rhythm: Normal rate and regular rhythm.     Pulses: Normal pulses.     Heart sounds: Normal heart sounds.  Pulmonary:     Effort: Pulmonary effort is normal.     Breath sounds: Normal breath sounds.  Abdominal:     General: Abdomen is flat. Bowel sounds are normal.     Palpations:  Abdomen is soft.     Tenderness: There is no abdominal tenderness.  Musculoskeletal:        General: Normal range of motion.     Cervical back: Normal range of motion and neck supple.  Skin:    General: Skin is warm and dry.     Capillary Refill: Capillary refill takes less than 2 seconds.  Neurological:     General: No focal deficit present.     Mental Status: He is alert.     ED Results / Procedures / Treatments   Labs (all labs ordered are listed, but only abnormal results are displayed) Labs Reviewed  HEMOGLOBIN A1C - Abnormal; Notable for the following components:      Result Value   Hgb A1c MFr Bld 6.0 (*)    All other  components within normal limits  LIPID PANEL - Abnormal; Notable for the following components:   HDL 38 (*)    LDL Cholesterol 134 (*)    All other components within normal limits  VITAMIN D 25 HYDROXY (VIT D DEFICIENCY, FRACTURES) - Abnormal; Notable for the following components:   Vit D, 25-Hydroxy 21.89 (*)    All other components within normal limits  BASIC METABOLIC PANEL - Abnormal; Notable for the following components:   Chloride 114 (*)    Glucose, Bld 113 (*)    Anion gap 4 (*)    All other components within normal limits  BASIC METABOLIC PANEL - Abnormal; Notable for the following components:   Glucose, Bld 108 (*)    All other components within normal limits  CBC - Abnormal; Notable for the following components:   RBC 4.08 (*)    Hemoglobin 12.8 (*)    All other components within normal limits  URINALYSIS, COMPLETE (UACMP) WITH MICROSCOPIC  TSH  VITAMIN B12  VITAMIN B1  TROPONIN I (HIGH SENSITIVITY)  TROPONIN I (HIGH SENSITIVITY)    EKG None  Radiology CT Angio Chest/Abd/Pel for Dissection W and/or Wo Contrast  Result Date: 01/22/2022 CLINICAL DATA:  Chest pain with hypotension, concern for dissection. EXAM: CT ANGIOGRAPHY CHEST, ABDOMEN AND PELVIS TECHNIQUE: Non-contrast CT of the chest was initially obtained. Multidetector CT imaging through the chest, abdomen and pelvis was performed using the standard protocol during bolus administration of intravenous contrast. Multiplanar reconstructed images and MIPs were obtained and reviewed to evaluate the vascular anatomy. RADIATION DOSE REDUCTION: This exam was performed according to the departmental dose-optimization program which includes automated exposure control, adjustment of the mA and/or kV according to patient size and/or use of iterative reconstruction technique. CONTRAST:  OMNIPAQUE IOHEXOL 350 MG/ML SOLN COMPARISON:  Same-day chest radiograph FINDINGS: CTA CHEST FINDINGS Cardiovascular: There is no acute  intramural hematoma on the initial noncontrast images. There is no evidence of dissection or aneurysm on the postcontrast images. The thoracic aorta is nonaneurysmal. There is mild calcified atherosclerotic plaque in the aortic arch. The heart size is normal. The pulmonary vasculature is normal with no evidence of PE. Mild coronary artery calcifications are noted. Mediastinum/Nodes: The thyroid is unremarkable. The esophagus is grossly unremarkable. There is no mediastinal, hilar, or axillary lymphadenopathy. Lungs/Pleura: Trachea and central airways are patent. There is no focal consolidation or pulmonary edema. There is no pleural effusion or pneumothorax. There is minimal dependent subsegmental atelectasis in the lung bases. There are no suspicious nodules. Musculoskeletal: There is no acute osseous abnormality or suspicious osseous lesion. Review of the MIP images confirms the above findings. CTA ABDOMEN AND PELVIS FINDINGS VASCULAR  Aorta: Normal caliber aorta without aneurysm, dissection, vasculitis or significant stenosis. There is mild atherosclerotic Celiac: Patent without evidence of aneurysm, dissection, vasculitis or significant stenosis. SMA: Patent without evidence of aneurysm, dissection, vasculitis or significant stenosis. Renals: Both renal arteries are patent without evidence of aneurysm, dissection, vasculitis, or fibromuscular dysplasia. Two left renal arteries are noted. There is calcified and noncalcified plaque at the origins of the renal arteries resulting in mild stenosis of the dominant superior left renal artery and moderate stenosis to severe of the smaller inferior left renal artery. IMA: Patent without evidence of aneurysm, dissection, vasculitis or significant stenosis. Inflow: Patent without evidence of aneurysm, dissection, vasculitis or significant stenosis. Veins: No obvious venous abnormality within the limitations of this arterial phase study. Review of the MIP images confirms the  above findings. NON-VASCULAR Hepatobiliary: The liver and gallbladder are unremarkable. There is no biliary ductal dilatation. Pancreas: Unremarkable. Spleen: Scattered punctate calcified granulomas are noted in the spleen. Adrenals/Urinary Tract: The adrenals are unremarkable A small hypodense lesion in the left kidney cortex is too small to characterize but likely reflects a benign cyst for which no specific imaging follow-up is required. There are no suspicious parenchymal lesions. There are no stones. There is no hydronephrosis or hydroureter. The bladder is decompressed but grossly unremarkable. Stomach/Bowel: The stomach is unremarkable. There is no evidence of bowel obstruction. There is no abnormal bowel wall thickening or inflammatory change. The appendix is normal. Lymphatic: There is no abdominopelvic lymphadenopathy. Reproductive: The prostate and seminal vesicles are unremarkable. Other: There is no ascites or free air. There is a small fat containing umbilical hernia. Musculoskeletal: There is no acute osseous abnormality or suspicious osseous lesion. Review of the MIP images confirms the above findings. IMPRESSION: 1. No evidence of aortic aneurysm or dissection. 2. No other acute findings in the chest, abdomen, or pelvis. 3. 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 4. Mild aortic atherosclerotic and coronary artery calcifications. Electronically Signed   By: Lesia Hausen M.D.   On: 01/22/2022 09:14   DG Chest Port 1 View  Result Date: 01/22/2022 CLINICAL DATA:  Chest pain, history of hypertension. EXAM: PORTABLE CHEST 1 VIEW COMPARISON:  Chest radiograph 01/17/2022, 10/28/2021 FINDINGS: Stable cardiomediastinal contours. Low lung volumes. The lungs are clear. No pneumothorax or significant pleural effusion. No acute finding in the visualized skeleton. IMPRESSION: No acute finding in the  chest. Electronically Signed   By: Emmaline Kluver M.D.   On: 01/22/2022 08:40    Procedures Procedures   Medications Ordered in ED Medications  pantoprazole (PROTONIX) EC tablet 40 mg (40 mg Oral Given 01/21/22 0805)  senna-docusate (Senokot-S) tablet 1 tablet (1 tablet Oral Given 01/18/22 1901)  magnesium hydroxide (MILK OF MAGNESIA) suspension 30 mL (has no administration in time range)  aspirin chewable tablet 81 mg (81 mg Oral Given 01/21/22 0806)  OLANZapine (ZYPREXA) tablet 2.5 mg (has no administration in time range)  LORazepam (ATIVAN) tablet 0.5 mg (has no administration in time range)  thiamine (VITAMIN B1) tablet 100 mg (100 mg Oral Given 01/21/22 0806)  multivitamin with minerals tablet 1 tablet (1 tablet Oral Given 01/21/22 0805)  LORazepam (ATIVAN) tablet 1 mg (1 mg Oral Given 01/21/22 2125)  hydrOXYzine (ATARAX) tablet 25 mg (25 mg Oral Given 01/21/22 1645)  loperamide (IMODIUM) capsule 2-4 mg (has no administration in time range)  ondansetron (ZOFRAN-ODT) disintegrating tablet 4 mg (has  no administration in time range)  acetaminophen (TYLENOL) tablet 650 mg (650 mg Oral Given 01/21/22 1645)  amLODipine (NORVASC) tablet 10 mg (10 mg Oral Given 01/21/22 0808)  hydrocortisone (ANUSOL-HC) 2.5 % rectal cream ( Rectal Given 01/21/22 1639)  QUEtiapine (SEROQUEL) tablet 300 mg (300 mg Oral Given 01/21/22 2122)  magnesium citrate solution 1 Bottle (1 Bottle Oral Not Given 01/21/22 1400)  Vitamin D (Ergocalciferol) (DRISDOL) 1.25 MG (50000 UNIT) capsule 50,000 Units (50,000 Units Oral Given 01/21/22 1600)  FLUoxetine (PROZAC) capsule 10 mg (has no administration in time range)  LORazepam (ATIVAN) tablet 1 mg (1 mg Oral Given 01/18/22 2318)  white petrolatum (VASELINE) gel (1 Application  Given 01/19/22 0003)  potassium chloride SA (KLOR-CON M) CR tablet 40 mEq (40 mEq Oral Given 01/19/22 1545)  cloNIDine (CATAPRES) tablet 0.1 mg (0.1 mg Oral Given 01/20/22 1203)  amLODipine (NORVASC) tablet 5  mg (5 mg Oral Given 01/20/22 1209)  iohexol (OMNIPAQUE) 350 MG/ML injection 100 mL (100 mLs Intravenous Contrast Given 01/22/22 0859)    ED Course/ Medical Decision Making/ A&P                           Medical Decision Making  Haaris Sanagustin  is a 61 y.o. male that was brought in from [facility] for chest pain and nausea this morning. Episode of left sided sharp chest pain that lasted for 10 minutes and was associated with nausea and slight vomitus. Patient was also hypotensive at [facility] and initially in the ER, though appeared asymptomatic. Given vitals and chest pain, will need to rule out ACS as well as dissection. Other considerations are PE, anxiety, MSK pain.  ACS rule out labs, CXR and dissection study were ordered. Labs with mildly decreased hemoglobin at 12.8, Troponin normal at 6>5. CXR was reviewed and negative for acute findings. CTA dissection study was negative for aneurysm or dissection.   Given patient continues to report SI with intent/plan, patient will be discharged back to [facility]. Return precautions given.   Final Clinical Impression(s) / ED Diagnoses Final diagnoses:  Atypical chest pain    Rx / DC Orders ED Discharge Orders     None         Dujuan Stankowski, DO 01/22/22 1254    Alvira Monday, MD 01/23/22 [phone]

## 2022-01-22 NOTE — ED Triage Notes (Signed)
Pt arrived via [facility] from [facility].  Pt had chest pain and some nausea this morning. Denies cardiac hx along with current chest pain.   324 SA, 500 NSS 78/40 --> 119/60, 80 HR, 96% RA

## 2022-01-23 DIAGNOSIS — F315 Bipolar disorder, current episode depressed, severe, with psychotic features: Principal | ICD-10-CM

## 2022-01-23 LAB — VITAMIN B1: Vitamin B1 (Thiamine): 100.8 nmol/L (ref 66.5–200.0)

## 2022-01-23 MED ORDER — ARIPIPRAZOLE 10 MG PO TABS
10.0000 mg | ORAL_TABLET | Freq: Every day | ORAL | Status: DC
  Administered 2022-01-24: 10 mg via ORAL
  Filled 2022-01-23 (×2): qty 1

## 2022-01-23 MED ORDER — ARIPIPRAZOLE 5 MG PO TABS
5.0000 mg | ORAL_TABLET | Freq: Once | ORAL | Status: AC
  Administered 2022-01-23: 5 mg via ORAL
  Filled 2022-01-23: qty 1

## 2022-01-23 MED ORDER — TRAZODONE HCL 150 MG PO TABS
150.0000 mg | ORAL_TABLET | Freq: Every evening | ORAL | Status: DC | PRN
  Administered 2022-01-23: 150 mg via ORAL
  Filled 2022-01-23: qty 1

## 2022-01-23 MED ORDER — AMLODIPINE BESYLATE 10 MG PO TABS
10.0000 mg | ORAL_TABLET | Freq: Every day | ORAL | Status: DC
  Administered 2022-01-23 – 2022-01-24 (×2): 10 mg via ORAL
  Filled 2022-01-23 (×3): qty 1

## 2022-01-23 MED ORDER — CLONIDINE HCL 0.1 MG PO TABS
0.1000 mg | ORAL_TABLET | Freq: Four times a day (QID) | ORAL | Status: DC | PRN
  Administered 2022-01-23 (×2): 0.1 mg via ORAL
  Filled 2022-01-23 (×2): qty 1

## 2022-01-23 NOTE — Progress Notes (Signed)
    01/23/22 2142  Psych Admission Type (Psych Patients Only)  Admission Status Voluntary  Psychosocial Assessment  Patient Complaints Insomnia  Eye Contact Fair  Facial Expression Anxious  Affect Labile  Speech Logical/coherent  Interaction Assertive  Motor Activity Slow  Appearance/Hygiene Unremarkable  Behavior Characteristics Cooperative;Appropriate to situation  Mood Anxious  Thought Process  Coherency WDL  Content Blaming others  Delusions None reported or observed  Perception WDL  Hallucination None reported or observed  Judgment Impaired  Confusion None  Danger to Self  Current suicidal ideation? Denies  Danger to Others  Danger to Others None reported or observed

## 2022-01-23 NOTE — [facility] Group Notes (Signed)
Adult Psychoeducational Group Note  Date:  01/23/2022 Time:  9:46 AM  Group Topic/Focus:  Goals Group:   The focus of this group is to help patients establish daily goals to achieve during treatment and discuss how the patient can incorporate goal setting into their daily lives to aide in recovery.  Participation Level:  Did Not Attend  Charles Estes R [name] 01/23/2022, 9:46 AM 

## 2022-01-23 NOTE — Progress Notes (Addendum)
   01/23/22 4356  Vital Signs  Temp 98.5 F (36.9 C)  Temp Source Oral  Pulse Rate 91  Pulse Rate Source Monitor  BP (!) 137/109  BP Method Automatic  Oxygen Therapy  SpO2 97 %   Pt reports sleeping not well last night, states he needs to leave today to go to doctor. Pt states that medication did not help him sleep last night. Will report to oncoming shift. Pt agitated this am over breakfast, but did not come out of room to line up for breakfast at scheduled time. Pt did receive a breakfast tray by mht afterwards.

## 2022-01-23 NOTE — Progress Notes (Signed)
[facility] MD Progress Note  01/23/2022 11:47 AM Charles Estes  MRN:  [id number]  HPI:  Charles Estes is a 61 year old African American male with a history of schizoaffective d/o, bipolar d/o, reflux, hypertension, depression and cocaine use who presented to the [facility] ER with +SI & chest pain. Pt was medically cleared, and transferred voluntarily to this [facility] for treatment and stabilization of his mood.  24 hr chart review: V/S have remained mostly elevated over the past 24 hrs (Please see V/S flow sheets). Pt was transferred to the ER for evaluation for chest pain yesterday, and medically cleared prior to pt returning to [facility]). As per nursing documentation, pt has not attended some unit group session in the past 24 hrs, and was agitated earlier today morning and asking to leave.  As per nursing documentation, he slept a total of 7.25 hrs last night.  Today's patient assessment note: Pt is seen in his room on the 300 hall. He presents today with a euthymic mood & affect is congruent.  His attention to personal hygiene and grooming is fair, eye contact is good, speech is clear & coherent. Thoughts are organized and logical, and pt currently denies SI/HI/AVH, and denies paranoia. There is no evidence of delusional thinking today.  He reports a poor night's sleep last night, and states that he feels tired this morning. He reports a good appetite.  He reports pain in his back and hip, states that pain is chronic, and currently moderate. He states that Tylenol is helpful. Pt educated that there is Tylenol ordered for pain as needed, and to ask assigned RN for it. Pt reports that he is tolerating his medications well, and denies any current medication related side effects. Pt taken off his Norvasc at [facility] yesterday, but this medication has been reordered at 10 mg daily due to persistent elevations in his BP. We will continue other medications as listed below. We will increase Abilify to 10 mg today for  management of pt's mood, and plan to discharge him tomorrow. No TD/EPS type symptoms found on assessment, and pt denies any feelings of stiffness. AIMS: 0.   Principal Problem: Bipolar disorder, curr episode depressed, severe, w/psychotic features (HCC) Diagnosis: Principal Problem:   Bipolar disorder, curr episode depressed, severe, w/psychotic features (HCC) Active Problems:   Cocaine abuse (HCC)   Alcohol use   Anxiety state   Hypertension  Total Time spent with patient: 20 minutes  Past Psychiatric History: As above  Past Medical History:  Past Medical History:  Diagnosis Date   Depression    GERD (gastroesophageal reflux disease)    Hypertension    Pathologic ulnar fracture with malunion    right    Past Surgical History:  Procedure Laterality Date   MANDIBLE FRACTURE SURGERY  2014   ORIF ULNAR FRACTURE Right 07/16/2017   Procedure: OPEN REDUCTION INTERNAL FIXATION (ORIF) RIGHT ULNA FRACTURE NONUNION;  Surgeon: Tarry Kos, MD;  Location: [facility];  Service: Orthopedics;  Laterality: Right;   Family History:  Family History  Problem Relation Age of Onset   Hypertension Mother    Heart attack Mother    Hypertension Father    Heart attack Father    Heart attack Sister    Hypertension Sister    Heart attack Sister    Hypertension Brother    Family Psychiatric  History: See H & P Social History:  Social History   Substance and Sexual Activity  Alcohol Use Yes  Comment: socially- past weekend reports 10 beers     Social History   Substance and Sexual Activity  Drug Use Yes   Types: Cocaine   Comment: occasionally    Social History   Socioeconomic History   Marital status: Single    Spouse name: Not on file   Number of children: Not on file   Years of education: Not on file   Highest education level: Not on file  Occupational History   Not on file  Tobacco Use   Smoking status: Some Days    Types: Cigarettes   Smokeless tobacco:  Never  Substance and Sexual Activity   Alcohol use: Yes    Comment: socially- past weekend reports 10 beers   Drug use: Yes    Types: Cocaine    Comment: occasionally   Sexual activity: Not on file  Other Topics Concern   Not on file  Social History Narrative   Not on file   Social Determinants of Health   Financial Resource Strain: Not on file  Food Insecurity: Not on file  Transportation Needs: Not on file  Physical Activity: Not on file  Stress: Not on file  Social Connections: Not on file   Additional Social History:     Sleep: Poor  Appetite:  Good  Current Medications: Current Facility-Administered Medications  Medication Dose Route Frequency Provider Last Rate Last Admin   acetaminophen (TYLENOL) tablet 650 mg  650 mg Oral Q6H PRN Starleen Blue, NP   650 mg at 01/21/22 1645   amLODipine (NORVASC) tablet 10 mg  10 mg Oral Daily Attiah, Nadir, MD   10 mg at 01/23/22 0804   [START ON 01/24/2022] ARIPiprazole (ABILIFY) tablet 10 mg  10 mg Oral Daily Cordaro Mukai, NP       ARIPiprazole (ABILIFY) tablet 5 mg  5 mg Oral Once Starleen Blue, NP       aspirin chewable tablet 81 mg  81 mg Oral Daily Singleton, Amy E, MD   81 mg at 01/23/22 0805   cloNIDine (CATAPRES) tablet 0.1 mg  0.1 mg Oral Q6H PRN Abbott Pao, Nadir, MD   0.1 mg at 01/23/22 1019   FLUoxetine (PROZAC) capsule 10 mg  10 mg Oral Daily Mason Jim, Amy E, MD   10 mg at 01/23/22 0805   hydrocortisone (ANUSOL-HC) 2.5 % rectal cream   Rectal BID Comer Locket, MD   Given at 01/22/22 1814   LORazepam (ATIVAN) tablet 0.5 mg  0.5 mg Oral Q8H PRN Starleen Blue, NP       magnesium citrate solution 1 Bottle  1 Bottle Oral Once Rosamary Boudreau, NP       magnesium hydroxide (MILK OF MAGNESIA) suspension 30 mL  30 mL Oral Daily PRN Bobbitt, Shalon E, NP       multivitamin with minerals tablet 1 tablet  1 tablet Oral Daily Trent Theisen, NP   1 tablet at 01/23/22 0806   OLANZapine (ZYPREXA) tablet 2.5 mg  2.5 mg Oral Q8H  PRN Starleen Blue, NP       pantoprazole (PROTONIX) EC tablet 40 mg  40 mg Oral Daily Mason Jim, Amy E, MD   40 mg at 01/23/22 2440   senna-docusate (Senokot-S) tablet 1 tablet  1 tablet Oral QHS PRN Comer Locket, MD   1 tablet at 01/18/22 1901   thiamine (VITAMIN B1) tablet 100 mg  100 mg Oral Daily Starleen Blue, NP   100 mg at 01/23/22 0806   traZODone (DESYREL) tablet 150  mg  150 mg Oral QHS PRN Sarita BottomAttiah, Nadir, MD       Vitamin D (Ergocalciferol) (DRISDOL) 1.25 MG (50000 UNIT) capsule 50,000 Units  50,000 Units Oral Q7 days Comer LocketSingleton, Amy E, MD   50,000 Units at 01/21/22 1600    Lab Results:  Results for orders placed or performed during the hospital encounter of 01/18/22 (from the past 48 hour(s))  Basic metabolic panel     Status: Abnormal   Collection Time: 01/22/22  8:08 AM  Result Value Ref Range   Sodium 141 135 - 145 mmol/L   Potassium 4.4 3.5 - 5.1 mmol/L   Chloride 106 98 - 111 mmol/L   CO2 27 22 - 32 mmol/L   Glucose, Bld 108 (H) 70 - 99 mg/dL    Comment: Glucose reference range applies only to samples taken after fasting for at least 8 hours.   BUN 14 6 - 20 mg/dL   Creatinine, Ser 1.611.18 0.61 - 1.24 mg/dL   Calcium 9.3 8.9 - 09.610.3 mg/dL   GFR, Estimated >04>60 >54>60 mL/min    Comment: (NOTE) Calculated using the CKD-EPI Creatinine Equation (2021)    Anion gap 8 5 - 15    Comment: Performed at [facility], [address] [place], [place] [address]  CBC     Status: Abnormal   Collection Time: 01/22/22  8:08 AM  Result Value Ref Range   WBC 7.1 4.0 - 10.5 K/uL   RBC 4.08 (L) 4.22 - 5.81 MIL/uL   Hemoglobin 12.8 (L) 13.0 - 17.0 g/dL   HCT 91.439.4 78.239.0 - 95.652.0 %   MCV 96.6 80.0 - 100.0 fL   MCH 31.4 26.0 - 34.0 pg   MCHC 32.5 30.0 - 36.0 g/dL   RDW 21.313.8 08.611.5 - 57.815.5 %   Platelets 262 150 - 400 K/uL   nRBC 0.0 0.0 - 0.2 %    Comment: Performed at [facility], [address] [place], [place] [address]  Troponin I (High Sensitivity)     Status: None    Collection Time: 01/22/22  8:08 AM  Result Value Ref Range   Troponin I (High Sensitivity) 6 <18 ng/L    Comment: (NOTE) Elevated high sensitivity troponin I (hsTnI) values and significant  changes across serial measurements may suggest ACS but many other  chronic and acute conditions are known to elevate hsTnI results.  Refer to the "Links" section for chest pain algorithms and additional  guidance. Performed at [facility], [address] [place], [place] [address]   Troponin I (High Sensitivity)     Status: None   Collection Time: 01/22/22 11:03 AM  Result Value Ref Range   Troponin I (High Sensitivity) 5 <18 ng/L    Comment: (NOTE) Elevated high sensitivity troponin I (hsTnI) values and significant  changes across serial measurements may suggest ACS but many other  chronic and acute conditions are known to elevate hsTnI results.  Refer to the "Links" section for chest pain algorithms and additional  guidance. Performed at [facility], [address] [place], [place] [address]     Blood Alcohol level:  Lab Results  Component Value Date   [facility] <10 01/17/2022   ETH <10 10/28/2021    Metabolic Disorder Labs: Lab Results  Component Value Date   HGBA1C 6.0 (H) 01/21/2022   MPG 125.5 01/21/2022   No results found for: "PROLACTIN" Lab Results  Component Value Date   CHOL 195 01/20/2022  TRIG 114 01/20/2022   HDL 38 (L) 01/20/2022   CHOLHDL 5.1 01/20/2022   VLDL 23 01/20/2022   LDLCALC 134 (H) 01/20/2022    Physical Findings: AIMS: Facial and Oral Movements Muscles of Facial Expression: None, normal Lips and Perioral Area: None, normal Jaw: None, normal Tongue: None, normal,Extremity Movements Upper (arms, wrists, hands, fingers): None, normal Lower (legs, knees, ankles, toes): None, normal, Trunk Movements Neck, shoulders, hips: None, normal, Overall Severity Severity of abnormal movements (highest score from questions above): None,  normal Incapacitation due to abnormal movements: None, normal Patient's awareness of abnormal movements (rate only patient's report): No Awareness, Dental Status Current problems with teeth and/or dentures?: No Does patient usually wear dentures?: No  CIWA:  CIWA-Ar Total: 1 COWS:     Musculoskeletal: Strength & Muscle Tone: within normal limits Gait & Station: normal Patient leans: N/A  Psychiatric Specialty Exam:  Presentation  General Appearance: Appropriate for Environment; Fairly Groomed  Eye Contact:Good  Speech:Clear and Coherent  Speech Volume:Normal  Handedness:Right   Mood and Affect  Mood:Euthymic  Affect:Congruent   Thought Process  Thought Processes:Coherent  Descriptions of Associations:Intact  Orientation:Full (Time, Place and Person)  Thought Content:Logical  History of Schizophrenia/Schizoaffective disorder:Yes  Duration of Psychotic Symptoms:No data recorded Hallucinations:Hallucinations: None  Ideas of Reference:None  Suicidal Thoughts:Suicidal Thoughts: No  Homicidal Thoughts:Homicidal Thoughts: No  Sensorium  Memory:Immediate Good  Judgment:Fair  Insight:Fair  Executive Functions  Concentration:Fair  Attention Span:Fair  Recall:Good  Fund of Knowledge:Good  Language:Good  Psychomotor Activity  Psychomotor Activity:Psychomotor Activity: Normal  Assets  Assets:Housing; Social Support  Sleep  Sleep:Sleep: Poor  Physical Exam: Physical Exam Constitutional:      Appearance: He is obese.  HENT:     Head: Normocephalic.     Nose: Nose normal.  Eyes:     Pupils: Pupils are equal, round, and reactive to light.  Pulmonary:     Effort: Pulmonary effort is normal.  Musculoskeletal:        General: Normal range of motion.     Cervical back: Normal range of motion.  Neurological:     Mental Status: He is alert and oriented to person, place, and time.    Review of Systems  Constitutional: Negative.   HENT:  Negative.    Eyes: Negative.   Respiratory: Negative.    Cardiovascular: Negative.   Gastrointestinal: Negative.   Genitourinary: Negative.   Musculoskeletal: Negative.   Skin: Negative.   Neurological: Negative.   Psychiatric/Behavioral:  Positive for depression and substance abuse. Negative for hallucinations, memory loss and suicidal ideas. The patient has insomnia. The patient is not nervous/anxious.    Blood pressure 136/89, pulse (!) 104, temperature 98.5 F (36.9 C), temperature source Oral, resp. rate 18, height 5\' 6"  (1.676 m), weight 89.4 kg, SpO2 96 %. Body mass index is 31.8 kg/m. Treatment Plan Summary: Daily contact with patient to assess and evaluate symptoms and progress in treatment and Medication management   Observation Level/Precautions:  15 minute checks  Laboratory:  Labs reviewed   Psychotherapy:  Unit Group sessions  Medications:  See [facility]  Consultations:  To be determined   Discharge Concerns:  Safety, medication compliance, mood stability  Estimated LOS: 5-7 days  Other:  N/A    PLAN Safety and Monitoring: Voluntary admission to inpatient psychiatric unit for safety, stabilization and treatment Daily contact with patient to assess and evaluate symptoms and progress in treatment Patient's case to be discussed in multi-disciplinary team meeting Observation Level : q15 minute checks  Vital signs: q12 hours Precautions: Safety   Long Term Goal(s): Improvement in symptoms so as ready for discharge   Short Term Goals: Ability to identify changes in lifestyle to reduce recurrence of condition will improve, Ability to disclose and discuss suicidal ideas, Ability to demonstrate self-control will improve, Ability to identify and develop effective coping behaviors will improve, Compliance with prescribed medications will improve, and Ability to identify triggers associated with substance abuse/mental health issues will improve   Diagnoses Principal Problem:    Bipolar disorder, curr episode depressed, severe, w/psychotic features (HCC) Active Problems:   Cocaine abuse (HCC)   Alcohol use   Anxiety state   Hypertension   Bipolar d/o most recent episode depressed severe with psychotic features (R/O Schizoaffective d/o) -Discontinue Seroquel 300 mg nightly d/t hypotension & dizziness (A1c 6.0, Lipids WNL other than HDL 38 and LDL 134, QTC ) -Continue Prozac 10mg  daily for depression -Increase Abilify to 10 mg daily for psychosis   Anxiety -Continue Hydroxyzine 25 mg every 6 hours PRN   R/o Alcohol use d/o  Stimulant use d/o - cocaine type -Continue Ativan 1mg  tabs every 6 hours PRN CIWA >10 - MVI and thiamine oral replacement - Encouraged residential rehab or SAIOP - Protonix 40mg  daily for GI prophylaxis  Insomnia -Increase Trazodone to 150 mg nightly PRN   HTN - Restart Norvasc 10 mg daily and ASA 81mg  daily  Constipation -Given Mg Citrate x 1 dose on 8/21 - Anusol cream PRN for rectal pain   Low Vit D - Continue Vit D 50,000IU weekly  HbGA1c 6.0/LDL 134 - will need monitoring while on antipsychotic and f/u with PCP for management   Agitation -Continue Zyprexa 2.5 mg/Ativan 0.5 mg-See MAR-Meds reduced in dosage due to daytime sedation with Zyprexa 5 mg & Ativan 1 mg    Other PRNS -Continue Tylenol 650 mg every 6 hours PRN for mild pain -Continue Maalox 30 mg every 4 hrs PRN for indigestion -Continue Imodium 2-4 mg as needed for diarrhea -Continue Milk of Magnesia as needed every 6 hrs for constipation -Continue Zofran disintegrating tabs every 6 hrs PRN for nausea    Labs Reviewed. EKG with QTC-469. K-3.1-ordered K 40 MEQ X 1 dose. Potassium level from 8/20 WNL at 4.0. Tox Screen +Cocaine.  UA WNL, Hemoglobin A1C pending. lipid panel with LDL elevated at 134, education provided on healthy food choices and exercise. TSH WNL. Vitamins D level low, B12 WNL & B1-Pending   Discharge Planning: Social work and case  management to assist with discharge planning and identification of hospital follow-up needs prior to discharge Estimated LOS: 5-7 days Discharge Concerns: Need to establish a safety plan; Medication compliance and effectiveness Discharge Goals: Return home with outpatient referrals for mental health follow-up including medication management/psychotherapy  , NP 01/23/2022, 11:47 AMPatient ID: , male   DOB: 09-Sep-1960, 61 y.o.   MRN: 01/25/2022 Patient ID: [name], male   DOB: [birth year], 61 y.o.   MRN: [id number] Patient ID: [name], male   DOB: June 04, 1960, 61 y.o.   MRN: [id number]

## 2022-01-23 NOTE — [facility] Suicide Risk Assessment (Signed)
[facility] INPATIENT:  Family/Significant Other Suicide Prevention Education  Suicide Prevention Education:  Contact Attempts: Ezzard Standing [phone] has been identified by the patient as the family member/significant other with whom the patient will be residing, and identified as the person(s) who will aid the patient in the event of a mental health crisis.  With written consent from the patient, two attempts were made to provide suicide prevention education, prior to and/or following the patient's discharge.  We were unsuccessful in providing suicide prevention education.  A suicide education pamphlet was given to the patient to share with family/significant other.  Date and time of first attempt:01/23/2022 2:42 PM\ Date and time of second attempt: CSW will make additional attempt to reach family/friend.   Corky Crafts 01/23/2022, 2:42 PM

## 2022-01-23 NOTE — Progress Notes (Signed)
   01/23/22 0500  Sleep  Number of Hours 7.25

## 2022-01-23 NOTE — Group Note (Signed)
Recreation Therapy Group Note   Group Topic:Team Building  Group Date: 01/23/2022 Start Time: 0940 End Time: 1000 Facilitators: Caroll Rancher, LRT,CTRS Location: 300 Hall Dayroom   Goal Area(s) Addresses:  Patient will effectively work with peer towards shared goal.  Patient will identify skills used to make activity successful.  Patient will identify how skills used during activity can be applied to reach post d/c goals.   Group Description: [employer]. In teams of 5-6, patients were given 12 craft pipe cleaners. Using the materials provided, patients were instructed to compete again the opposing team(s) to build the tallest free-standing structure from floor level. The activity was timed; difficulty increased by Clinical research associate as Production designer, theatre/television/film continued.  Systematically resources were removed with additional directions for example, placing one arm behind their back, working in silence, and shape stipulations. LRT facilitated post-activity discussion reviewing team processes and necessary communication skills involved in completion. Patients were encouraged to reflect how the skills utilized, or not utilized, in this activity can be incorporated to positively impact support systems post discharge.   Affect/Mood: N/A   Participation Level: Did not attend    Clinical Observations/Individualized Feedback:     Plan: Continue to engage patient in RT group sessions 2-3x/week.   Caroll Rancher, LRT,CTRS 01/23/2022 12:12 PM

## 2022-01-23 NOTE — Group Note (Signed)
[facility] LCSW Group Therapy Note   Group Date: 01/23/2022 Start Time: 1300 End Time: 1400  Type of Therapy/Topic:  Group Therapy:  Feelings about Diagnosis  Participation Level:  Did Not Attend  Description of Group:   This group will allow patients to explore their thoughts and feelings about hospitalization. Patients will be guided to explore their level of understanding and acceptance of being admitted. Facilitator will encourage patients to process their thoughts and feelings about the reactions of others, and will guide patients in identifying ways to discuss that this current hospital stay can benefit their mental health. This group will be process-oriented, with patients participating in exploration of their own experiences as well as giving and receiving support and challenge from other group members.   Therapeutic Goals: 1. Patient will demonstrate understanding of the positive and negative feelings surrounding hospitalization  2. Patient will be able to express two feelings regarding the hospitalization 3. Patient will demonstrate ability to communicate their needs through discussion and/or role plays  Summary of Patient Progress: Patient was able to provide their thoughts about being hospitalized.  Patient was did not attend during group session.  Therapeutic Modalities:   Cognitive Behavioral Therapy Brief Therapy Feelings Identification
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
## 2022-01-23 NOTE — Progress Notes (Signed)
Writer asked patient to come to dayroom for vitals. Charles Estes  said he did not feel like coming. Writer asked Charles Estes  a second time to come to dayroom for vitals. Charles Estes finally came for vitals. Writer announce to line up to go to cafeteria for breakfast. Charles Estes said no one told him line up for breakfast. Writer explain to Charles Estes after vitals every morning at 6:45 we go down for breakfast. Charles Estes was brought back to the adult unit for Charles Estes.

## 2022-01-23 NOTE — Plan of Care (Addendum)
Patient med compliant and denies SI,HI, and A/V/H at this moment stating "I just want to be with my grandkids." Patient states feeling better today and denies any major symptoms except for pain 8/10 on lower back and hip. Patient noted to have hypertension (138/107) and given clonidine po prn.  BP at 136/89 at reassessment. Patient also given tylenol po prn which provided some relief. Patient slightly irritable this morning regarding breakfast but his mood improved throughout the day and has been med compliant. Patient socializing appropriately with staff and peers.    Problem: Coping: Goal: Ability to identify and develop effective coping behavior will improve Outcome: Progressing   Problem: Safety: Goal: Ability to remain free from injury will improve Outcome: Progressing

## 2022-01-24 MED ORDER — TRAZODONE HCL 100 MG PO TABS: 200.0000 mg | ORAL_TABLET | Freq: Every evening | ORAL | 0 refills | Status: DC | PRN

## 2022-01-24 MED ORDER — PANTOPRAZOLE SODIUM 40 MG PO TBEC
40.0000 mg | DELAYED_RELEASE_TABLET | Freq: Every day | ORAL | 0 refills | Status: DC
Start: 2022-01-25 — End: 2022-03-28

## 2022-01-24 MED ORDER — TRAZODONE HCL 100 MG PO TABS
200.0000 mg | ORAL_TABLET | Freq: Every evening | ORAL | Status: DC | PRN
Start: 2022-01-24 — End: 2022-01-24

## 2022-01-24 MED ORDER — ARIPIPRAZOLE 10 MG PO TABS
10.0000 mg | ORAL_TABLET | Freq: Every day | ORAL | 0 refills | Status: DC
Start: 2022-01-25 — End: 2022-03-28

## 2022-01-24 MED ORDER — FLUOXETINE HCL 10 MG PO CAPS
10.0000 mg | ORAL_CAPSULE | Freq: Every day | ORAL | 0 refills | Status: DC
Start: 2022-01-25 — End: 2022-03-28

## 2022-01-24 MED ORDER — VITAMIN D (ERGOCALCIFEROL) 1.25 MG (50000 UNIT) PO CAPS: 50000.0000 [IU] | ORAL_CAPSULE | ORAL | 0 refills | Status: DC

## 2022-01-24 MED ORDER — AMLODIPINE BESYLATE 10 MG PO TABS
10.0000 mg | ORAL_TABLET | Freq: Every day | ORAL | 0 refills | Status: DC
Start: 2022-01-25 — End: 2022-03-28

## 2022-01-24 NOTE — Progress Notes (Signed)
[facility] Child/Adolescent Case Management Discharge Plan :  Will you be returning to the same living situation after discharge: Yes,  pt reported he will return back to his home in [place], [place] At discharge, do you have transportation home?:Yes,  pt drove himself to hospital  and car is in parking lot Do you have the ability to pay for your medications:Yes,  pt has medical coverage  Release of information consent forms completed and in the chart;  Patient's signature needed at discharge.  Patient to Follow up at:  Follow-up Information     [facility] Follow up on 02/06/2022.   Why: You have an appointment for therapy services on 02/06/22 at 5:00 pm.  This will be a Virtual appointment. Contact information: [address] [place], [place] [address] [phone]         [facility] Follow up.   Why: You have an appointment for medication management services on Contact information: [address] [place] [address] [phone]                 Family Contact:  Telephone:  Spoke with:  Mayra Neer, daughter [phone]  Patient denies SI/HI:   Yes,  pt denies SI/HI/AVH     Safety Planning and Suicide Prevention discussed:  Yes,  SPE discussed and pamphlet will be given at the time of discharge . CSW completed SPE with patient. Discussed potential triggers leading to suicidal ideation in addition to coping skills one might use in order to delay and distract self from self harming behaviors. CSW encouraged patient to utilize emergency services if they felt unable to maintain their safety. SPE flyer provided to patient at this time.      Rogene Houston 01/24/2022, 9:44 AM
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
## 2022-01-24 NOTE — [facility] Suicide Risk Assessment (Signed)
[facility] Discharge Suicide Risk Assessment   Principal Problem: Bipolar disorder, curr episode depressed, severe, w/psychotic features [facility]) Discharge Diagnoses: Principal Problem:   Bipolar disorder, curr episode depressed, severe, w/psychotic features (HCC) Active Problems:   Cocaine abuse (HCC)   Alcohol use   Anxiety state   Hypertension   Total Time spent with patient: 45 minutes  Reason for admission: Charles Estes is a 61 year old African American male with a history of schizoaffective d/o, bipolar d/o, reflux, hypertension, depression and cocaine use who presented to the [facility] ER with +SI & chest pain. Pt was medically cleared, and transferred voluntarily to this [facility] for treatment and stabilization of his mood.  PTA Medications:  Medications Prior to Admission  Medication Sig Dispense Refill Last Dose   aspirin EC 81 MG tablet Take 81 mg by mouth daily. Swallow whole.         amLODipine (NORVASC) 5 MG tablet Take 5 mg by mouth daily.           Hospital Course:   During the patient's hospitalization, patient had extensive initial psychiatric evaluation, and follow-up psychiatric evaluations every day.  Psychiatric diagnoses provided upon initial assessment: Bipolar disorder, curr episode depressed, severe, w/psychotic features (HCC) Active Problems:   Cocaine abuse (HCC)   Alcohol use   Anxiety state  Patient's psychiatric medications were adjusted on admission: Was started on Seroquel for mood stabilization and depression, started hydroxyzine for anxiety as needed, also started Protonix for GI prophylaxis and restarted Norvasc for hypertension  During the hospitalization, other adjustments were made to the patient's psychiatric medication regimen: Seroquel has to be discontinued for low blood pressure readings, was a started on Abilify and titrated up to 10 mg daily to address mood stabilization and depression as well as reported auditory and visual hallucination prior  to admission, Norvasc was titrated to 10 mg daily for hypertension, Prozac was added 10 mg daily for depression and vitamin D was restarted for vitamin D deficiency.  Patient was on trazodone as needed for sleep dose was titrated up gradually for lack of efficacy up to 200 mg at bedtime as needed with recommendation to follow-up with outpatient provider after discharge.  Patient's care was discussed during the interdisciplinary team meeting every day during the hospitalization.  The patient denied having side effects to prescribed psychiatric medication.  Gradually, patient started adjusting to milieu. The patient was evaluated each day by a clinical provider to ascertain response to treatment. Improvement was noted by the patient's report of decreasing symptoms, improved sleep and appetite, affect, medication tolerance, behavior, and participation in unit programming.  Patient was asked each day to complete a self inventory noting mood, mental status, pain, new symptoms, anxiety and concerns.    Symptoms were reported as significantly decreased or resolved completely by discharge.   On day of discharge, patient was evaluated on 01/24/2022 the patient reports that their mood is stable. The patient denied having suicidal thoughts for more than 48 hours prior to discharge.  Patient denies having homicidal thoughts.  Patient denies having auditory hallucinations.  Patient denies any visual hallucinations or other symptoms of psychosis. The patient was motivated to continue taking medication with a goal of continued improvement in mental health.  On day of discharge patient reported benefiting from groups during hospital stay understanding need to abstain completely from illicit drug use after discharge including cocaine.  He reports his main motivation to continue abstaining from illicit drugs and have healthy life is his grandchildren whom  he enjoys spending time with.  He plans to go stay with his daughter  in [place] after discharge for 2 to 3 months then will go back to his place in [place].  He denied his daughter having any weapons but he notes he has a gun in [place] for protection. He was able to discuss thoroughly coping skills with stressors as well as crisis plan. The patient reports their target psychiatric symptoms of increased depression, psychosis and SI responded well to the psychiatric medications, and the patient reports overall benefit other psychiatric hospitalization. Supportive psychotherapy was provided to the patient. The patient also participated in regular group therapy while hospitalized. Coping skills, problem solving as well as relaxation therapies were also part of the unit programming.  Labs were reviewed with the patient, and abnormal results were discussed with the patient.  The patient is able to verbalize their individual safety plan to this provider. During hospital stay patient was sent to the emergency room for complaint of chest pain and was cleared medically and sent back, in the emergency room he was recommended to be titrated up on Norvasc for better management of hypertension, 10 mg Norvasc help with his blood pressure problem but patient continued to have some high blood pressure reading and was recommended to follow-up with his primary care provider after discharge, he agreed.   Behavioral Events: None  Restraints: None  Groups: Attended some groups and participated with encouragement  Medications Changes: As noted above  D/C Medications: Norvasc 10 mg daily for hypertension, Abilify 10 mg daily for mood and depression, Prozac 10 mg daily for depression, aspirin 81 mg daily, Protonix 40 mg daily for GI protection, vitamin D oral weekly for vitamin D deficiency, trazodone 200 mg at bedtime as needed for sleep.  Sleep  Sleep: Improved some during hospital stay but continues to report interrupted sleep on trazodone, dose was titrated up to 200 mg with  recommendation to follow with psychiatric provider after discharge.  Musculoskeletal: Strength & Muscle Tone: within normal limits Gait & Station: normal Patient leans: N/A  Psychiatric Specialty Exam  General Appearance: appears at stated age, fairly dressed and groomed  Behavior: pleasant and cooperative  Psychomotor Activity:No psychomotor agitation or retardation noted   Eye Contact: good Speech: normal amount, tone, volume and latency   Mood: euthymic Affect: congruent, pleasant and interactive  Thought Process: linear, goal directed, no circumstantial or tangential thought process noted, no racing thoughts or flight of ideas Descriptions of Associations: intact Thought Content: Hallucinations: denies AH, VH , does not appear responding to stimuli Delusions: No paranoia or other delusions noted Suicidal Thoughts: denies SI, intention, plan  Homicidal Thoughts: denies HI, intention, plan   Alertness/Orientation: alert and fully oriented  Insight: fair, improved Judgment: fair, improved  Memory: intact  Executive Functions  Concentration: intact  Attention Span: Fair Recall: intact Fund of Knowledge: fair   Art therapist  Concentration: intact Attention Span: Fair Recall: intact Fund of Knowledge: fair   Assets  Assets:Housing; Social Support   Physical Exam: Physical Exam ROS Blood pressure (!) 123/91, pulse 100, temperature 98.6 F (37 C), temperature source Oral, resp. rate 18, height 5\' 6"  (1.676 m), weight 89.4 kg, SpO2 95 %. Body mass index is 31.8 kg/m.  Mental Status Per Nursing Assessment::   On Admission:  Suicidal ideation indicated by patient, Suicide plan  Demographic Factors:  Male, Unemployed, and Access to firearms  Loss Factors: NA  Historical Factors: NA  Risk Reduction Factors:   Living  with another person, especially a relative, Positive social support, and Positive coping skills or problem solving  skills  Continued Clinical Symptoms: Improved significantly during hospital stay Bipolar Disorder:   Depressive phase  Cognitive Features That Contribute To Risk:  Closed-mindedness    Suicide Risk:  Minimal: No identifiable suicidal ideation.  Patients presenting with no risk factors but with morbid ruminations; may be classified as minimal risk based on the severity of the depressive symptoms   Follow-up Information     [facility] Follow up on 02/06/2022.   Why: You have an appointment for therapy services on 02/06/22 at 5:00 pm.  This will be a Virtual appointment. Contact information: [address] [place], [place] [address] [phone]         [facility] Follow up.   Why: You have an appointment for medication management services on Contact information: [address] [place] [address] [phone]                 Plan Of Care/Follow-up recommendations:   Discharge recommendations:     Activity: as tolerated  Diet: heart healthy  # It is recommended to the patient to continue psychiatric medications as prescribed, after discharge from the hospital.     # It is recommended to the patient to follow up with your outpatient psychiatric provider and PCP.   # It was discussed with the patient, the impact of alcohol, drugs, tobacco have been there overall psychiatric and medical wellbeing, and total abstinence from substance use was recommended the patient.ed.   # Prescriptions provided or sent directly to preferred pharmacy at discharge. Patient agreeable to plan. Given opportunity to ask questions. Appears to feel comfortable with discharge.    # In the event of worsening symptoms, the patient is instructed to call the crisis hotline, 911 and or go to the nearest ED for appropriate evaluation and treatment of symptoms. To follow-up with primary care provider for other medical issues,  concerns and or health care needs   # Patient was discharged home with a plan to follow up as noted above.  -Follow-up with outpatient primary care doctor and other specialists -for management of chronic medical disease, including: Patient was recommended to follow-up with primary care provider after discharge for management of hypertension, he agrees to make his own appointment in the next few days.     Patient agrees with D/C instructions and plan.  The patient received suicide prevention pamphlet:  Yes Belongings returned:  Clothing and Valuables  Total Time Spent in Direct Patient Care:  I personally spent 45 minutes on the unit in direct patient care. The direct patient care time included face-to-face time with the patient, reviewing the patient's chart, communicating with other professionals, and coordinating care. Greater than 50% of this time was spent in counseling or coordinating care with the patient regarding goals of hospitalization, psycho-education, and discharge planning needs.   Breeanna Galgano 01/24/2022, 9:59 AM   Samie Reasons Abbott Pao, MD 01/24/2022, 9:59 AM

## 2022-01-24 NOTE — Progress Notes (Signed)
Patient is discharging at this time. Patient is A&Ox4. Vs stable. Patient denies SI,HI, and A/V/H with no plan/intent. Printed AVS reviewed with and given to patient along with medications and follow up appointments. Patient verbalized all understanding. All valuables/belongings returned to patient. Patient is being transported by his cousin. Patient denies any pain/discomfort. No s/s of current distress.

## 2022-01-24 NOTE — [facility] Group Notes (Signed)
Adult Psychoeducational Group Note  Date:  01/24/2022 Time:  10:31 AM  Group Topic/Focus:  Goals Group:   The focus of this group is to help patients establish daily goals to achieve during treatment and discuss how the patient can incorporate goal setting into their daily lives to aide in recovery.  Participation Level:  Did Not Attend  Participation Quality:   Did not attend  Affect:   Did not attend  Cognitive:   Did not attend  Insight: None  Engagement in Group:   Did not attend  Modes of Intervention:   Did not attend  Additional Comments:  Pt did not attend goal group. When ask by staff to attend.  Isaiah Cianci, Sharen Counter 01/24/2022, 10:31 AM

## 2022-01-24 NOTE — [facility] Counselor (Signed)
Suicide Prevention Education:   Contact Attempts: Ezzard Standing [phone] has been identified by the patient as the family member/significant other with whom the patient will be residing and identified as the person(s) who will aid the patient in the event of a mental health crisis.  With written consent from the patient, two attempts were made to provide suicide prevention education, prior to and/or following the patient's discharge.  We were unsuccessful in providing suicide prevention education.  A suicide education pamphlet was given to the patient to share with family/significant other.   Date and time of second attempt:01/24/2022 at 11:40 am  Derrell Lolling

## 2022-01-24 NOTE — [facility] Group Notes (Signed)
Pt did not attend nutrition group. 

## 2022-02-28 IMAGING — DX DG CHEST 2V
2 series · 2 of 2 positions shown · non-contrast
Comparison: 05/24/2020

CLINICAL DATA: Productive cough for 2 weeks

EXAM:
CHEST - 2 VIEW

[chest pa]
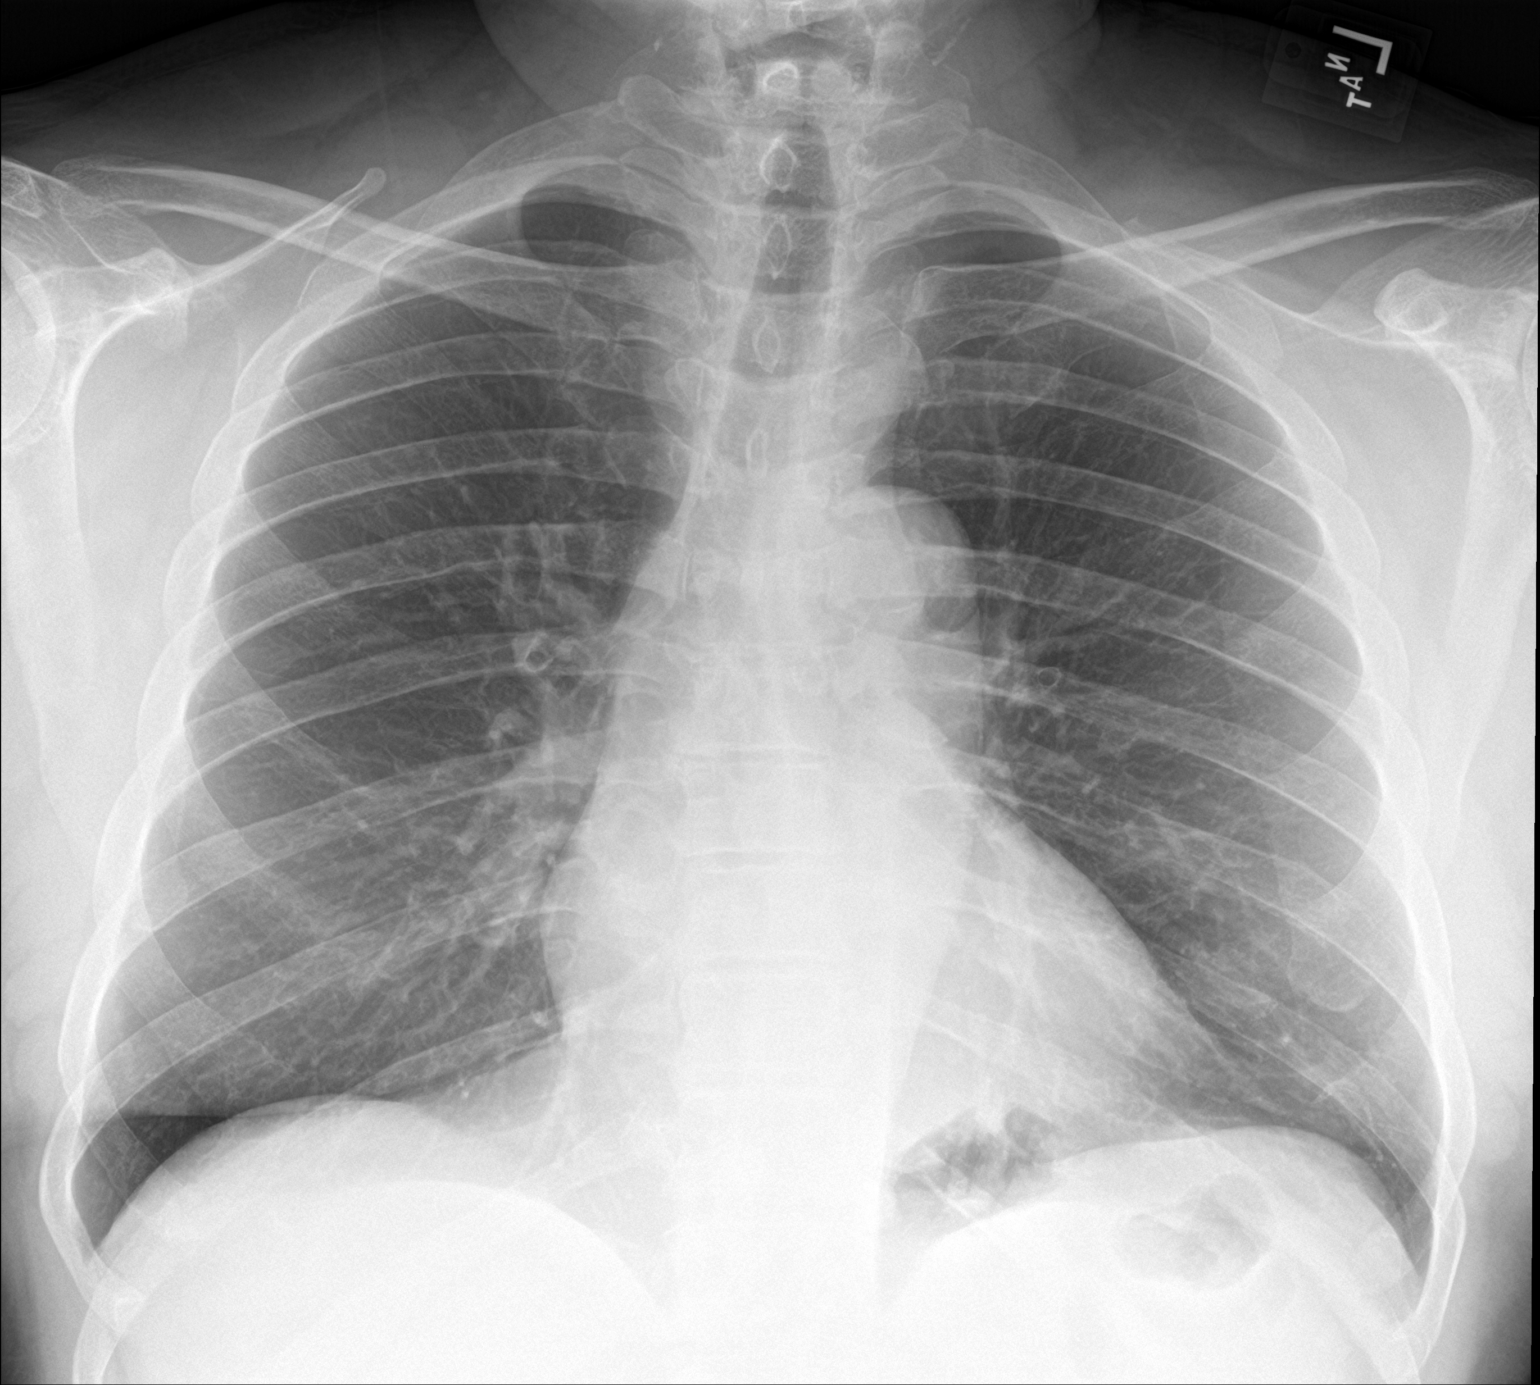

[chest lat]
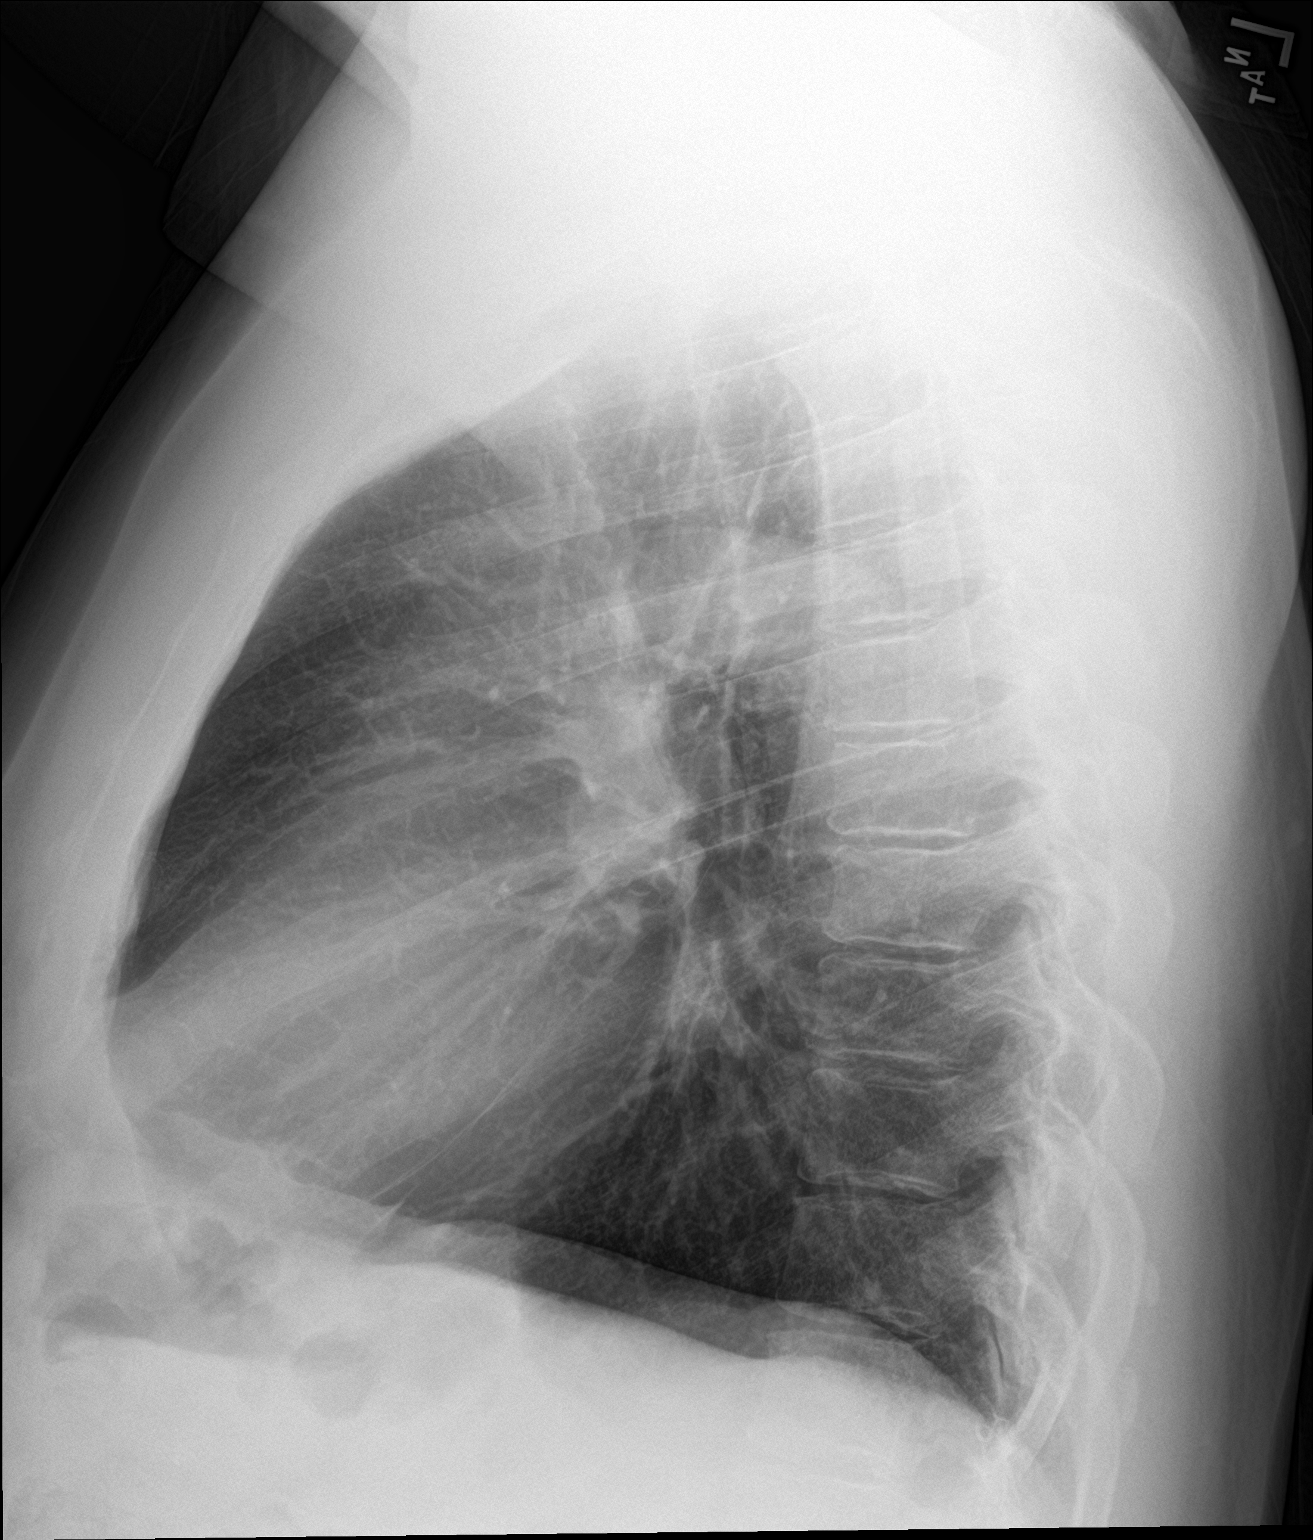

[2 of 2 positions shown; findings below may reference images not displayed]

FINDINGS: The heart size and mediastinal contours are within normal limits. No
focal airspace consolidation, pleural effusion, or pneumothorax. The
visualized skeletal structures are unremarkable.
IMPRESSION: No active cardiopulmonary disease.

## 2022-03-23 ENCOUNTER — Emergency Department (HOSPITAL_COMMUNITY): Payer: Medicare Other

## 2022-03-23 ENCOUNTER — Other Ambulatory Visit: Payer: Self-pay

## 2022-03-23 ENCOUNTER — Other Ambulatory Visit (HOSPITAL_COMMUNITY): Payer: Medicare Other

## 2022-03-23 ENCOUNTER — Encounter (HOSPITAL_COMMUNITY): Payer: Self-pay | Admitting: Emergency Medicine

## 2022-03-23 ENCOUNTER — Inpatient Hospital Stay (HOSPITAL_COMMUNITY)
Admission: EM | Admit: 2022-03-23 | Discharge: 2022-03-28 | DRG: 917 | Disposition: A | Discharge status: Home / Self Care | Payer: Medicare Other | Attending: Internal Medicine | Admitting: Internal Medicine

## 2022-03-23 DIAGNOSIS — N179 Acute kidney failure, unspecified: Secondary | ICD-10-CM | POA: Diagnosis present

## 2022-03-23 DIAGNOSIS — E8729 Other acidosis: Secondary | ICD-10-CM | POA: Diagnosis present

## 2022-03-23 DIAGNOSIS — F1410 Cocaine abuse, uncomplicated: Secondary | ICD-10-CM | POA: Diagnosis present

## 2022-03-23 DIAGNOSIS — G928 Other toxic encephalopathy: Secondary | ICD-10-CM | POA: Diagnosis present

## 2022-03-23 DIAGNOSIS — Z1152 Encounter for screening for COVID-19: Secondary | ICD-10-CM | POA: Diagnosis not present

## 2022-03-23 DIAGNOSIS — R7401 Elevation of levels of liver transaminase levels: Secondary | ICD-10-CM | POA: Diagnosis not present

## 2022-03-23 DIAGNOSIS — I9589 Other hypotension: Secondary | ICD-10-CM | POA: Diagnosis not present

## 2022-03-23 DIAGNOSIS — I10 Essential (primary) hypertension: Secondary | ICD-10-CM | POA: Diagnosis present

## 2022-03-23 DIAGNOSIS — Z8249 Family history of ischemic heart disease and other diseases of the circulatory system: Secondary | ICD-10-CM | POA: Diagnosis not present

## 2022-03-23 DIAGNOSIS — Z79899 Other long term (current) drug therapy: Secondary | ICD-10-CM

## 2022-03-23 DIAGNOSIS — E872 Acidosis, unspecified: Secondary | ICD-10-CM | POA: Diagnosis present

## 2022-03-23 DIAGNOSIS — E785 Hyperlipidemia, unspecified: Secondary | ICD-10-CM | POA: Diagnosis present

## 2022-03-23 DIAGNOSIS — L89321 Pressure ulcer of left buttock, stage 1: Secondary | ICD-10-CM | POA: Diagnosis present

## 2022-03-23 DIAGNOSIS — R6510 Systemic inflammatory response syndrome (SIRS) of non-infectious origin without acute organ dysfunction: Secondary | ICD-10-CM | POA: Diagnosis present

## 2022-03-23 DIAGNOSIS — G9340 Encephalopathy, unspecified: Secondary | ICD-10-CM | POA: Diagnosis not present

## 2022-03-23 DIAGNOSIS — F319 Bipolar disorder, unspecified: Secondary | ICD-10-CM | POA: Diagnosis present

## 2022-03-23 DIAGNOSIS — I959 Hypotension, unspecified: Secondary | ICD-10-CM | POA: Diagnosis present

## 2022-03-23 DIAGNOSIS — Z79891 Long term (current) use of opiate analgesic: Secondary | ICD-10-CM | POA: Diagnosis not present

## 2022-03-23 DIAGNOSIS — T43291A Poisoning by other antidepressants, accidental (unintentional), initial encounter: Principal | ICD-10-CM | POA: Diagnosis present

## 2022-03-23 DIAGNOSIS — F419 Anxiety disorder, unspecified: Secondary | ICD-10-CM | POA: Diagnosis present

## 2022-03-23 DIAGNOSIS — E860 Dehydration: Secondary | ICD-10-CM | POA: Diagnosis present

## 2022-03-23 DIAGNOSIS — R7989 Other specified abnormal findings of blood chemistry: Secondary | ICD-10-CM | POA: Diagnosis not present

## 2022-03-23 DIAGNOSIS — L89311 Pressure ulcer of right buttock, stage 1: Secondary | ICD-10-CM | POA: Diagnosis present

## 2022-03-23 DIAGNOSIS — K759 Inflammatory liver disease, unspecified: Secondary | ICD-10-CM

## 2022-03-23 DIAGNOSIS — K219 Gastro-esophageal reflux disease without esophagitis: Secondary | ICD-10-CM | POA: Diagnosis present

## 2022-03-23 DIAGNOSIS — M6282 Rhabdomyolysis: Secondary | ICD-10-CM | POA: Diagnosis present

## 2022-03-23 DIAGNOSIS — Z5900 Homelessness unspecified: Secondary | ICD-10-CM

## 2022-03-23 DIAGNOSIS — T50904A Poisoning by unspecified drugs, medicaments and biological substances, undetermined, initial encounter: Secondary | ICD-10-CM

## 2022-03-23 DIAGNOSIS — Z7982 Long term (current) use of aspirin: Secondary | ICD-10-CM

## 2022-03-23 DIAGNOSIS — Z91148 Patient's other noncompliance with medication regimen for other reason: Secondary | ICD-10-CM

## 2022-03-23 DIAGNOSIS — R4182 Altered mental status, unspecified: Principal | ICD-10-CM

## 2022-03-23 DIAGNOSIS — F17210 Nicotine dependence, cigarettes, uncomplicated: Secondary | ICD-10-CM | POA: Diagnosis present

## 2022-03-23 DIAGNOSIS — Z765 Malingerer [conscious simulation]: Secondary | ICD-10-CM

## 2022-03-23 DIAGNOSIS — Z888 Allergy status to other drugs, medicaments and biological substances status: Secondary | ICD-10-CM

## 2022-03-23 DIAGNOSIS — Z7151 Drug abuse counseling and surveillance of drug abuser: Secondary | ICD-10-CM

## 2022-03-23 LAB — APTT: aPTT: 26 seconds (ref 24–36)

## 2022-03-23 LAB — I-STAT ARTERIAL BLOOD GAS, ED
Acid-base deficit: 6 mmol/L — ABNORMAL HIGH (ref 0.0–2.0)
Bicarbonate: 18.9 mmol/L — ABNORMAL LOW (ref 20.0–28.0)
Calcium, Ion: 1.14 mmol/L — ABNORMAL LOW (ref 1.15–1.40)
HCT: 37 % — ABNORMAL LOW (ref 39.0–52.0)
Hemoglobin: 12.6 g/dL — ABNORMAL LOW (ref 13.0–17.0)
O2 Saturation: 95 %
Patient temperature: 98
Potassium: 4 mmol/L (ref 3.5–5.1)
Sodium: 136 mmol/L (ref 135–145)
TCO2: 20 mmol/L — ABNORMAL LOW (ref 22–32)
pCO2 arterial: 32.9 mmHg (ref 32–48)
pH, Arterial: 7.365 (ref 7.35–7.45)
pO2, Arterial: 79 mmHg — ABNORMAL LOW (ref 83–108)

## 2022-03-23 LAB — I-STAT CHEM 8, ED
BUN: 35 mg/dL — ABNORMAL HIGH (ref 6–20)
Calcium, Ion: 1.05 mmol/L — ABNORMAL LOW (ref 1.15–1.40)
Chloride: 99 mmol/L (ref 98–111)
Creatinine, Ser: 4.1 mg/dL — ABNORMAL HIGH (ref 0.61–1.24)
Glucose, Bld: 171 mg/dL — ABNORMAL HIGH (ref 70–99)
HCT: 46 % (ref 39.0–52.0)
Hemoglobin: 15.6 g/dL (ref 13.0–17.0)
Potassium: 4 mmol/L (ref 3.5–5.1)
Sodium: 135 mmol/L (ref 135–145)
TCO2: 24 mmol/L (ref 22–32)

## 2022-03-23 LAB — URINALYSIS, ROUTINE W REFLEX MICROSCOPIC
Bilirubin Urine: NEGATIVE
Glucose, UA: NEGATIVE mg/dL
Ketones, ur: NEGATIVE mg/dL
Leukocytes,Ua: NEGATIVE
Nitrite: NEGATIVE
Protein, ur: 100 mg/dL — AB
Specific Gravity, Urine: 1.014 (ref 1.005–1.030)
pH: 5 (ref 5.0–8.0)

## 2022-03-23 LAB — PROTIME-INR
INR: 1.1 (ref 0.8–1.2)
INR: 1.3 — ABNORMAL HIGH (ref 0.8–1.2)
Prothrombin Time: 13.9 seconds (ref 11.4–15.2)
Prothrombin Time: 15.6 seconds — ABNORMAL HIGH (ref 11.4–15.2)

## 2022-03-23 LAB — LACTIC ACID, PLASMA
Lactic Acid, Venous: 2.6 mmol/L (ref 0.5–1.9)
Lactic Acid, Venous: 1.8 mmol/L (ref 0.5–1.9)

## 2022-03-23 LAB — COMPREHENSIVE METABOLIC PANEL
ALT: 138 U/L — ABNORMAL HIGH (ref 0–44)
ALT: 136 U/L — ABNORMAL HIGH (ref 0–44)
ALT: 110 U/L — ABNORMAL HIGH (ref 0–44)
AST: 404 U/L — ABNORMAL HIGH (ref 15–41)
AST: 394 U/L — ABNORMAL HIGH (ref 15–41)
AST: 259 U/L — ABNORMAL HIGH (ref 15–41)
Albumin: 3.8 g/dL (ref 3.5–5.0)
Albumin: 3.7 g/dL (ref 3.5–5.0)
Albumin: 2.5 g/dL — ABNORMAL LOW (ref 3.5–5.0)
Alkaline Phosphatase: 75 U/L (ref 38–126)
Alkaline Phosphatase: 72 U/L (ref 38–126)
Alkaline Phosphatase: 57 U/L (ref 38–126)
Anion gap: 18 — ABNORMAL HIGH (ref 5–15)
Anion gap: 13 (ref 5–15)
Anion gap: 16 — ABNORMAL HIGH (ref 5–15)
BUN: 33 mg/dL — ABNORMAL HIGH (ref 6–20)
BUN: 33 mg/dL — ABNORMAL HIGH (ref 6–20)
BUN: 34 mg/dL — ABNORMAL HIGH (ref 6–20)
CO2: 21 mmol/L — ABNORMAL LOW (ref 22–32)
CO2: 20 mmol/L — ABNORMAL LOW (ref 22–32)
CO2: 21 mmol/L — ABNORMAL LOW (ref 22–32)
Calcium: 9.3 mg/dL (ref 8.9–10.3)
Calcium: 8.6 mg/dL — ABNORMAL LOW (ref 8.9–10.3)
Calcium: 7.9 mg/dL — ABNORMAL LOW (ref 8.9–10.3)
Chloride: 97 mmol/L — ABNORMAL LOW (ref 98–111)
Chloride: 102 mmol/L (ref 98–111)
Chloride: 100 mmol/L (ref 98–111)
Creatinine, Ser: 4.03 mg/dL — ABNORMAL HIGH (ref 0.61–1.24)
Creatinine, Ser: 3.78 mg/dL — ABNORMAL HIGH (ref 0.61–1.24)
Creatinine, Ser: 3.69 mg/dL — ABNORMAL HIGH (ref 0.61–1.24)
GFR, Estimated: 16 mL/min — ABNORMAL LOW (ref >60)
GFR, Estimated: 18 mL/min — ABNORMAL LOW (ref >60)
GFR, Estimated: 18 mL/min — ABNORMAL LOW (ref >60)
Glucose, Bld: 131 mg/dL — ABNORMAL HIGH (ref 70–99)
Glucose, Bld: 95 mg/dL (ref 70–99)
Glucose, Bld: 145 mg/dL — ABNORMAL HIGH (ref 70–99)
Potassium: 3.8 mmol/L (ref 3.5–5.1)
Potassium: 3.7 mmol/L (ref 3.5–5.1)
Potassium: 3.8 mmol/L (ref 3.5–5.1)
Sodium: 136 mmol/L (ref 135–145)
Sodium: 135 mmol/L (ref 135–145)
Sodium: 137 mmol/L (ref 135–145)
Total Bilirubin: 0.6 mg/dL (ref 0.3–1.2)
Total Bilirubin: 0.6 mg/dL (ref 0.3–1.2)
Total Bilirubin: 0.4 mg/dL (ref 0.3–1.2)
Total Protein: 7.2 g/dL (ref 6.5–8.1)
Total Protein: 7 g/dL (ref 6.5–8.1)
Total Protein: 5.2 g/dL — ABNORMAL LOW (ref 6.5–8.1)

## 2022-03-23 LAB — CBC WITH DIFFERENTIAL/PLATELET
Abs Immature Granulocytes: 0.04 10*3/uL (ref 0.00–0.07)
Abs Immature Granulocytes: 0.04 10*3/uL (ref 0.00–0.07)
Basophils Absolute: 0 10*3/uL (ref 0.0–0.1)
Basophils Absolute: 0 10*3/uL (ref 0.0–0.1)
Basophils Relative: 0 %
Basophils Relative: 0 %
Eosinophils Absolute: 0 10*3/uL (ref 0.0–0.5)
Eosinophils Absolute: 0.1 10*3/uL (ref 0.0–0.5)
Eosinophils Relative: 0 %
Eosinophils Relative: 1 %
HCT: 39 % (ref 39.0–52.0)
HCT: 46.2 % (ref 39.0–52.0)
Hemoglobin: 13.4 g/dL (ref 13.0–17.0)
Hemoglobin: 15.5 g/dL (ref 13.0–17.0)
Immature Granulocytes: 0 %
Immature Granulocytes: 0 %
Lymphocytes Relative: 12 %
Lymphocytes Relative: 12 %
Lymphs Abs: 1.3 10*3/uL (ref 0.7–4.0)
Lymphs Abs: 1.5 10*3/uL (ref 0.7–4.0)
MCH: 31.4 pg (ref 26.0–34.0)
MCH: 31.5 pg (ref 26.0–34.0)
MCHC: 33.5 g/dL (ref 30.0–36.0)
MCHC: 34.4 g/dL (ref 30.0–36.0)
MCV: 91.5 fL (ref 80.0–100.0)
MCV: 93.7 fL (ref 80.0–100.0)
Monocytes Absolute: 1 10*3/uL (ref 0.1–1.0)
Monocytes Absolute: 1 10*3/uL (ref 0.1–1.0)
Monocytes Relative: 8 %
Monocytes Relative: 9 %
Neutro Abs: 8.3 10*3/uL — ABNORMAL HIGH (ref 1.7–7.7)
Neutro Abs: 9.6 10*3/uL — ABNORMAL HIGH (ref 1.7–7.7)
Neutrophils Relative %: 78 %
Neutrophils Relative %: 80 %
Platelets: 133 10*3/uL — ABNORMAL LOW (ref 150–400)
Platelets: 266 10*3/uL (ref 150–400)
RBC: 4.26 MIL/uL (ref 4.22–5.81)
RBC: 4.93 MIL/uL (ref 4.22–5.81)
RDW: 13.2 % (ref 11.5–15.5)
RDW: 13.4 % (ref 11.5–15.5)
WBC: 10.8 10*3/uL — ABNORMAL HIGH (ref 4.0–10.5)
WBC: 12.1 10*3/uL — ABNORMAL HIGH (ref 4.0–10.5)
nRBC: 0 % (ref 0.0–0.2)
nRBC: 0 % (ref 0.0–0.2)

## 2022-03-23 LAB — SALICYLATE LEVEL
Salicylate Lvl: 9.5 mg/dL (ref 7.0–30.0)
Salicylate Lvl: <7 mg/dL — ABNORMAL LOW (ref 7.0–30.0)

## 2022-03-23 LAB — AMMONIA: Ammonia: 16 umol/L (ref 9–35)

## 2022-03-23 LAB — TROPONIN I (HIGH SENSITIVITY)
Troponin I (High Sensitivity): 109 ng/L (ref <18)
Troponin I (High Sensitivity): 125 ng/L (ref <18)
Troponin I (High Sensitivity): 157 ng/L (ref <18)
Troponin I (High Sensitivity): 238 ng/L (ref <18)

## 2022-03-23 LAB — SARS CORONAVIRUS 2 BY RT PCR: SARS Coronavirus 2 by RT PCR: NEGATIVE

## 2022-03-23 LAB — PHOSPHORUS: Phosphorus: 4.5 mg/dL (ref 2.5–4.6)

## 2022-03-23 LAB — RAPID URINE DRUG SCREEN, HOSP PERFORMED
Amphetamines: NOT DETECTED
Barbiturates: NOT DETECTED
Benzodiazepines: NOT DETECTED
Cocaine: POSITIVE — AB
Opiates: NOT DETECTED
Tetrahydrocannabinol: NOT DETECTED

## 2022-03-23 LAB — RESP PANEL BY RT-PCR (FLU A&B, COVID) ARPGX2
Influenza A by PCR: NEGATIVE
Influenza B by PCR: NEGATIVE
SARS Coronavirus 2 by RT PCR: NEGATIVE

## 2022-03-23 LAB — MAGNESIUM: Magnesium: 2 mg/dL (ref 1.7–2.4)

## 2022-03-23 LAB — ACETAMINOPHEN LEVEL: Acetaminophen (Tylenol), Serum: <10 ug/mL — ABNORMAL LOW (ref 10–30)

## 2022-03-23 LAB — ETHANOL: Alcohol, Ethyl (B): <10 mg/dL (ref <10)

## 2022-03-23 LAB — TSH: TSH: 1.006 u[IU]/mL (ref 0.350–4.500)

## 2022-03-23 LAB — CK
Total CK: >50000 U/L — ABNORMAL HIGH (ref 49–397)
Total CK: 44786 U/L — ABNORMAL HIGH (ref 49–397)

## 2022-03-23 MED ORDER — VANCOMYCIN HCL IN DEXTROSE 1-5 GM/200ML-% IV SOLN: 1000.0000 mg | Freq: Once | INTRAVENOUS | Status: DC

## 2022-03-23 MED ORDER — ACETAMINOPHEN 650 MG RE SUPP: 650.0000 mg | Freq: Four times a day (QID) | RECTAL | Status: DC | PRN

## 2022-03-23 MED ORDER — LACTATED RINGERS IV BOLUS (SEPSIS)
1000.0000 mL | Freq: Once | INTRAVENOUS | Status: AC
  Administered 2022-03-23: 1000 mL via INTRAVENOUS

## 2022-03-23 MED ORDER — METRONIDAZOLE 500 MG/100ML IV SOLN
500.0000 mg | Freq: Once | INTRAVENOUS | Status: AC
  Administered 2022-03-23: 500 mg via INTRAVENOUS
  Filled 2022-03-23: qty 100

## 2022-03-23 MED ORDER — LABETALOL HCL 5 MG/ML IV SOLN
10.0000 mg | INTRAVENOUS | Status: DC | PRN
  Administered 2022-03-23 – 2022-03-24 (×2): 10 mg via INTRAVENOUS
  Filled 2022-03-23 (×3): qty 4

## 2022-03-23 MED ORDER — VANCOMYCIN VARIABLE DOSE PER UNSTABLE RENAL FUNCTION (PHARMACIST DOSING): Status: DC

## 2022-03-23 MED ORDER — LACTATED RINGERS IV BOLUS: 1000.0000 mL | Freq: Once | INTRAVENOUS | Status: DC

## 2022-03-23 MED ORDER — NALOXONE HCL 0.4 MG/ML IJ SOLN
0.4000 mg | Freq: Once | INTRAMUSCULAR | Status: AC
  Administered 2022-03-23: 0.4 mg via INTRAVENOUS
  Filled 2022-03-23: qty 1

## 2022-03-23 MED ORDER — SODIUM CHLORIDE 0.9 % IV SOLN
2.0000 g | INTRAVENOUS | Status: DC
  Administered 2022-03-23: 2 g via INTRAVENOUS
  Filled 2022-03-23: qty 12.5

## 2022-03-23 MED ORDER — VANCOMYCIN HCL 1500 MG/300ML IV SOLN
1500.0000 mg | Freq: Once | INTRAVENOUS | Status: AC
  Administered 2022-03-23: 1500 mg via INTRAVENOUS
  Filled 2022-03-23: qty 300

## 2022-03-23 MED ORDER — NALOXONE HCL 2 MG/2ML IJ SOSY
PREFILLED_SYRINGE | INTRAMUSCULAR | Status: AC
  Administered 2022-03-23: 2 mg
  Filled 2022-03-23: qty 2

## 2022-03-23 MED ORDER — LACTATED RINGERS IV BOLUS (SEPSIS): 1000.0000 mL | Freq: Once | INTRAVENOUS | Status: DC

## 2022-03-23 MED ORDER — ACETYLCYSTEINE LOAD VIA INFUSION
150.0000 mg/kg | Freq: Once | INTRAVENOUS | Status: AC
  Administered 2022-03-23: 13410 mg via INTRAVENOUS
  Filled 2022-03-23: qty 440

## 2022-03-23 MED ORDER — LACTATED RINGERS IV SOLN: INTRAVENOUS | Status: DC

## 2022-03-23 MED ORDER — SODIUM BICARBONATE 8.4 % IV SOLN
INTRAVENOUS | Status: DC
  Filled 2022-03-23 (×4): qty 1000

## 2022-03-23 MED ORDER — DEXTROSE 5 % IV SOLN
15.0000 mg/kg/h | INTRAVENOUS | Status: DC
  Administered 2022-03-23 (×2): 15 mg/kg/h via INTRAVENOUS
  Filled 2022-03-23 (×2): qty 90

## 2022-03-23 MED ORDER — ACETAMINOPHEN 325 MG PO TABS: 650.0000 mg | ORAL_TABLET | Freq: Four times a day (QID) | ORAL | Status: DC | PRN

## 2022-03-23 MED ORDER — HYDRALAZINE HCL 25 MG PO TABS
25.0000 mg | ORAL_TABLET | Freq: Four times a day (QID) | ORAL | Status: DC | PRN
  Administered 2022-03-23: 25 mg via ORAL
  Filled 2022-03-23: qty 1

## 2022-03-23 MED ORDER — ACETYLCYSTEINE LOAD VIA INFUSION: 150.0000 mg/kg | Freq: Once | INTRAVENOUS | Status: DC

## 2022-03-23 MED ORDER — THIAMINE HCL 100 MG/ML IJ SOLN
100.0000 mg | Freq: Once | INTRAMUSCULAR | Status: AC
  Administered 2022-03-23: 100 mg via INTRAVENOUS
  Filled 2022-03-23: qty 2

## 2022-03-23 MED ORDER — SODIUM CHLORIDE 0.9 % IV SOLN
2.0000 g | Freq: Once | INTRAVENOUS | Status: AC
  Administered 2022-03-23: 2 g via INTRAVENOUS
  Filled 2022-03-23: qty 12.5

## 2022-03-23 MED ORDER — AMLODIPINE BESYLATE 10 MG PO TABS
10.0000 mg | ORAL_TABLET | Freq: Every day | ORAL | Status: DC
  Administered 2022-03-23: 10 mg via ORAL
  Filled 2022-03-23: qty 1

## 2022-03-23 NOTE — H&P (Addendum)
History and Physical    PLEASE NOTE THAT DRAGON DICTATION SOFTWARE WAS USED IN THE CONSTRUCTION OF THIS NOTE.   Charles Estes ZOX:[id number] DOB: 12-Sep-1960 DOA: 03/23/2022  PCP: Samara Deist, MD  Patient coming from: home   I have personally briefly reviewed patient's old medical records in [place]  Chief Complaint: Altered mental status  HPI: Charles Estes is a 61 y.o. male with medical history significant for bipolar disorder, polysubstance abuse, including cocaine abuse, essential hypertension, who is admitted to [facility] on 03/23/2022 with acute metabolic encephalopathy in the setting of rhabdomyolysis after presenting from home to [facility] ED for evaluation of altered mental status.  The patient's mental status, somnolence, the following history is obtained via my discussions with the EDP as well as via chart review.  Patient with a documented history of bipolar disorder as well as cocaine abuse, who was dropped off the ED by an unidentified individual in order for the patient to be evaluated for altered mental status.  He was initially noted to be hypotensive with reported initial systolic blood pressures in the 60s.  This reportedly improved with dose of Narcan, although mental status did not improve significantly with Narcan.  Subsequently, patient has exhibited no additional evidence of hypotension.  When the patient briefly awakens to questions posed to him, he is able to respond with a few points for following back asleep.  He reportedly has denied any associated chest pain or shortness of breath.  However, he reported taking 5 Wellbutrin earlier in the evening, but denies any associated intent to harm himself with this action.      ED Course:  Vital signs in the ED were notable for the following: Afebrile; heart rate 78-1 13; blood pressures as conveyed above; respiratory rate 14-23, oxygen saturation 96 to 99% on room air.  Labs were notable for the following:  ABG notable for 7.365/32 point 9/79/18 0.9.  CMP notable for sodium 136, bicarbonate 20, anion gap 18, creatinine 4.03 compared to most recent prior value of 1.18 in August 2023, glucose 131.  Alkaline phosphatase and total bilirubin within normal limits.  AST 404, ALT 138, with no prior liver enzymes available for point comparison.  Total CPK greater than 50,000.  Initial high-sensitivity troponin I found to be 109, with repeat value trending up slightly to 1.5.  Serum acetaminophen level less than 10.  Serum salicylate level 9.5, with repeat value demonstrating less than 7.0.  Serum ethanol level less than 10.  Initial lactate 2.6, with repeat value trending down to 1.8.  CBC notable for low cell count 2100 with 80% neutrophils, hemoglobin 15.5.  INR 1.1.  PTT 26.  Urinalysis showed 6-10 white blood cells, rare bacteria, was positive for hyaline cast showed large hemoglobin in the absence of any red blood cells, and demonstrated 100 protein.  Urinary drug screen ordered, with result currently pending.  Cultures x2 as well as urine culture collected prior to initiation of IV antibiotics.  COVID-19/flu Enza PCR negative.  Imaging and additional notable ED work-up: Initial EKG showed sinus tachycardia with heart rate 1 1, normal intervals, no evidence of T wave or ST changes, including no evidence of ST elevation.  Chest x-ray showed evidence of acute cardiopulmonary process.  Noncontrast CT head showed no evidence of acute process, including no evidence of internal hemorrhage nor any evidence of acute infarct.  In the setting of the patient's report of taking 5 Wellbutrin will in the evening, EDP discussed case with  poison control, who recommended every 4 hours EKG monitoring to evaluate for subsequent development of QTc/QRS prolongation, which, if this develops, they recommend initiation of bicarbonate drip.  Additionally, while acetaminophen level was nonelevated, poison control conveyed that such finding  could be present in the setting of stage III liver failure, and given the patient's presenting transaminitis, recommended empiric initiation of N-acetylcysteine.  Additionally, EDP discussed patient's case with on-call PCCM, who, following evaluation, recommended admission to the hospitalist service.  While in the ED, the following were administered: Cefepime, IV Flagyl, IV vancomycin, Procardia drip initiated, N-acetylcysteine, Narcan x2 doses, thiamine 100 mg IV x1, lactated Ringer's x3 other bolus followed by initiation continuous LR at 150 cc/h.  Subsequently, the patient was admitted for further evaluation management of presenting acute metabolic encephalopathy in setting of rhabdomyolysis as well as acute kidney injury with additional presenting labs notable for mildly elevated troponin as well as transaminitis, presumed to be acute.     Review of Systems: As per HPI otherwise 10 point review of systems negative.   Past Medical History:  Diagnosis Date   Depression    GERD (gastroesophageal reflux disease)    Hypertension    Pathologic ulnar fracture with malunion    right    Past Surgical History:  Procedure Laterality Date   MANDIBLE FRACTURE SURGERY  2014   ORIF ULNAR FRACTURE Right 07/16/2017   Procedure: OPEN REDUCTION INTERNAL FIXATION (ORIF) RIGHT ULNA FRACTURE NONUNION;  Surgeon: Leandrew Koyanagi, MD;  Location: [facility];  Service: Orthopedics;  Laterality: Right;    Social History:  reports that he has been smoking cigarettes. He has never used smokeless tobacco. He reports current alcohol use. He reports current drug use. Drug: Cocaine.   Allergies  Allergen Reactions   Paxil [Paroxetine]     Per pt sores in mouth and diarrhea     Family History  Problem Relation Age of Onset   Hypertension Mother    Heart attack Mother    Hypertension Father    Heart attack Father    Heart attack Sister    Hypertension Sister    Heart attack Sister     Hypertension Brother     Family history reviewed and not pertinent    Prior to Admission medications   Medication Sig Start Date End Date Taking? Authorizing Provider  amLODipine (NORVASC) 10 MG tablet Take 1 tablet (10 mg total) by mouth daily. 01/25/22   Dian Situ, MD  ARIPiprazole (ABILIFY) 10 MG tablet Take 1 tablet (10 mg total) by mouth daily. 01/25/22   Dian Situ, MD  aspirin EC 81 MG tablet Take 81 mg by mouth daily. Swallow whole.    [provider]  FLUoxetine (PROZAC) 10 MG capsule Take 1 capsule (10 mg total) by mouth daily. 01/25/22   Dian Situ, MD  pantoprazole (PROTONIX) 40 MG tablet Take 1 tablet (40 mg total) by mouth daily. 01/25/22   Dian Situ, MD  traZODone (DESYREL) 100 MG tablet Take 2 tablets (200 mg total) by mouth at bedtime as needed for sleep. 01/24/22   Dian Situ, MD  Vitamin D, Ergocalciferol, (DRISDOL) 1.25 MG (50000 UNIT) CAPS capsule Take 1 capsule (50,000 Units total) by mouth every 7 (seven) days. 01/28/22   Dian Situ, MD  calcium-vitamin D (OSCAL WITH D) 500-200 MG-UNIT tablet Take 1 tablet by mouth 3 (three) times daily. Patient not taking: Reported on 11/20/2017 07/16/17 10/23/20  Leandrew Koyanagi, MD  promethazine (PHENERGAN) 25 MG tablet Take  1 tablet (25 mg total) by mouth every 6 (six) hours as needed for nausea. Patient not taking: Reported on 11/20/2017 07/16/17 10/23/20  Leandrew Koyanagi, MD     Objective    Physical Exam: Vitals:   03/23/22 0500 03/23/22 0515 03/23/22 0530 03/23/22 0536  BP: (!) 190/132 (!) 186/123 (!) 188/125   Pulse: (!) 111 100 (!) 105 98  Resp: (!) 23  (!) 21 14  Temp:    98.4 F (36.9 C)  TempSrc:    Temporal  SpO2: 97% 98% 98% 99%  Weight:        General: appears to be stated age; somnolent, will briefly open eyes to verbal stimuli, with the patient responding in a few words to questions posed to him, before falling back asleep.  Unable to follow instructions at this time. Skin: warm, dry, no  rash Head:  AT/ Mouth:  Oral mucosa membranes appear dry, normal dentition Neck: supple; trachea midline Heart:  RRR; did not appreciate any M/R/G Lungs: CTAB, did not appreciate any wheezes, rales, or rhonchi Abdomen: + BS; soft, ND, NT Vascular: 2+ pedal pulses b/l; 2+ radial pulses b/l Extremities: no peripheral edema, no muscle wasting Neuro: In the setting of the patient's current mental status and associated inability to follow instructions, unable to perform full neurologic exam at this time.  As such, assessment of strength, sensation, and cranial nerves is limited at this time. Patient noted to spontaneously move all 4 extremities. No tremors.      Labs on Admission: I have personally reviewed following labs and imaging studies  CBC: Recent Labs  Lab 03/23/22 0035 03/23/22 0103 03/23/22 0230  WBC 12.1*  --   --   NEUTROABS 9.6*  --   --   HGB 15.5 15.6 12.6*  HCT 46.2 46.0 37.0*  MCV 93.7  --   --   PLT 266  --   --    Basic Metabolic Panel: Recent Labs  Lab 03/23/22 0035 03/23/22 0103 03/23/22 0200 03/23/22 0230  NA 136 135 135 136  K 3.8 4.0 3.7 4.0  CL 97* 99 102  --   CO2 21*  --  20*  --   GLUCOSE 131* 171* 95  --   BUN 33* 35* 33*  --   CREATININE 4.03* 4.10* 3.78*  --   CALCIUM 9.3  --  8.6*  --    GFR: Estimated Creatinine Clearance: 22 mL/min (A) (by C-G formula based on SCr of 3.78 mg/dL (H)). Liver Function Tests: Recent Labs  Lab 03/23/22 0035 03/23/22 0200  AST 404* 394*  ALT 138* 136*  ALKPHOS 75 72  BILITOT 0.6 0.6  PROT 7.2 7.0  ALBUMIN 3.8 3.7   No results for input(s): "LIPASE", "AMYLASE" in the last 168 hours. Recent Labs  Lab 03/23/22 0441  AMMONIA 16   Coagulation Profile: Recent Labs  Lab 03/23/22 0035  INR 1.1   Cardiac Enzymes: Recent Labs  Lab 03/23/22 0200  CKTOTAL >50,000*   BNP (last 3 results) No results for input(s): "PROBNP" in the last 8760 hours. HbA1C: No results for input(s): "HGBA1C" in the  last 72 hours. CBG: No results for input(s): "GLUCAP" in the last 168 hours. Lipid Profile: No results for input(s): "CHOL", "HDL", "LDLCALC", "TRIG", "CHOLHDL", "LDLDIRECT" in the last 72 hours. Thyroid Function Tests: No results for input(s): "TSH", "T4TOTAL", "FREET4", "T3FREE", "THYROIDAB" in the last 72 hours. Anemia Panel: No results for input(s): "VITAMINB12", "FOLATE", "FERRITIN", "TIBC", "IRON", "RETICCTPCT" in the  last 72 hours. Urine analysis:    Component Value Date/Time   COLORURINE YELLOW 01/19/2022 1550   APPEARANCEUR CLEAR 01/19/2022 1550   LABSPEC 1.019 01/19/2022 1550   PHURINE 5.0 01/19/2022 1550   GLUCOSEU NEGATIVE 01/19/2022 1550   HGBUR NEGATIVE 01/19/2022 1550   BILIRUBINUR NEGATIVE 01/19/2022 1550   KETONESUR NEGATIVE 01/19/2022 1550   PROTEINUR NEGATIVE 01/19/2022 1550   UROBILINOGEN 0.2 11/11/2020 1047   NITRITE NEGATIVE 01/19/2022 1550   LEUKOCYTESUR NEGATIVE 01/19/2022 1550    Radiological Exams on Admission: CT HEAD WO CONTRAST (5MM)  Result Date: 03/23/2022 CLINICAL DATA:  [place] and hit head. EXAM: CT HEAD WITHOUT CONTRAST TECHNIQUE: Contiguous axial images were obtained from the base of the skull through the vertex without intravenous contrast. RADIATION DOSE REDUCTION: This exam was performed according to the departmental dose-optimization program which includes automated exposure control, adjustment of the mA and/or kV according to patient size and/or use of iterative reconstruction technique. COMPARISON:  None Available. FINDINGS: Brain: No evidence of acute infarction, hemorrhage, hydrocephalus, extra-axial collection or mass lesion/mass effect. There are mildly prominent perivascular spaces in the anterior basal ganglia. Mild cerebral cortical atrophy with minimal small vessel disease in the deep white matter. Vascular: There are scattered calcifications in both siphons. No hyperdense central vasculature. Skull: There is no visible scalp hematoma. No  fracture or focal skull lesion is seen. Sinuses/Orbits: There are chronic depressed fractures of both orbital floors. The orbital contents are unremarkable. Visible sinuses and mastoid air cells are clear. Other: None. IMPRESSION: 1. No acute intracranial CT findings. 2. No depressed skull fractures. 3. Chronic depressed fractures of both orbital floors. Electronically Signed   By: Telford Nab M.D.   On: 03/23/2022 03:56   DG Elbow Complete Left  Result Date: 03/23/2022 CLINICAL DATA:  Pain after fall EXAM: LEFT ELBOW - COMPLETE 3+ VIEW COMPARISON:  None Available. FINDINGS: There is no evidence of fracture, dislocation, or joint effusion. There is no evidence of arthropathy or other focal bone abnormality. Soft tissues are unremarkable. IMPRESSION: No acute fracture or dislocation. Electronically Signed   By: Placido Sou M.D.   On: 03/23/2022 02:08   DG Wrist Complete Left  Result Date: 03/23/2022 CLINICAL DATA:  Pain after fall EXAM: LEFT WRIST - COMPLETE 3+ VIEW COMPARISON:  None Available. FINDINGS: There is no evidence of fracture or dislocation. Widening of the scapholunate interval. Degenerative arthritis first CMC joint. Soft tissues are unremarkable. IMPRESSION: No acute fracture or dislocation. Electronically Signed   By: Placido Sou M.D.   On: 03/23/2022 02:08   DG Chest Port 1 View  Result Date: 03/23/2022 CLINICAL DATA:  Questionable sepsis. EXAM: PORTABLE CHEST 1 VIEW COMPARISON:  Chest radiograph dated 01/22/2022. FINDINGS: No focal consolidation, pleural effusion, pneumothorax. The cardiac silhouette is within normal limits. No acute osseous pathology. IMPRESSION: No active disease. Electronically Signed   By: Anner Crete M.D.   On: 03/23/2022 01:05     EKG: Independently reviewed, with result as described above.    Assessment/Plan   Principal Problem:   Acute encephalopathy Active Problems:   AKI (acute kidney injury) (HCC)   Rhabdomyolysis   SIRS  (systemic inflammatory response syndrome) (HCC)   High anion gap metabolic acidosis   Elevated troponin   Transaminitis      #) Acute metabolic encephalopathy: Somnolence, as conveyed above, which appears to be metabolic in nature as a consequence of multiple contributing factors, including presence of rhabdomyolysis complicated by acute kidney injury.  Suspect potential cocaine use  in the context of documented history of chronic cocaine abuse, although urinary drug screen result is pending at this time.  There is also the possibility of additional toxic contribution given the patient's report of taking 5 elevation earlier in the day.  No evidence to suggest underlying factious process at this time, including urinalysis that is inconsistent with UTI, while chest x-ray shows notes of acute cardiopulmonary process, including no evidence of infiltrate, will COVID-19/influenza PCR negative.  Suspect that patient's hepatocellular transaminitis of AST predominance is as a consequence of hepatotoxic implications from suspected cocaine abuse versus implications of rhabdomyolysis as opposed to representing acute viral hepatitis.  CT head without evidence of acute process.  Overall, acute CVA is felt to be less likely at this time.  Additionally, presenting blood gas showed no evidence to suggest hypercapnic encephalopathy.   Plan: Further evaluation management present rhabdomyolysis as well as AKI, as further detailed below.  Follow-up for urinary drug screen.  N.p.o. for now until mental status improves visually such as the patient is already sedated and passed nursing bedside swallow screen.  Repeat INR in the morning.  Fall precautions ordered.  Check TSH.  Every 4 neurochecks ordered.  Repeat CMP/CBC in the morning.  Add on serum magnesium level.            #) Rhabdomyolysis: Diagnosis on the basis of CPK level greater than 50,000, with urinalysis showed just above myoglobinuria.  Suspect  contribution from cocaine use given document history of recurrent cocaine abuse, with fewer drinks result currently pending.  Of note, bicarbonate drip was started in the ED this evening.  Plan: Continue lactated Ringer's at 150 cc/h.  Repeat CPK level ordered for the morning.  Monitor strict I's and O's and daily weights.  Further evaluation management of AKI.  Follow-up result urinary drug screen.  Add on serum magnesium level serum phosphorus level.           #) Acute Kidney Injury:  as quantified above.  With urinalysis with microscopy suggestive of myoglobinuria consistent with presenting rhabdomyolysis is likely a significant contributing factor, as well is the presence of hyaline cast, consistent with a picture of dehydration also demonstrating 100 protein.  Potential additional contribution from diminished renal perfusion as consequence of initial hypotension, which subsequently resolved following IV fluids/Narcan.  Plan: monitor strict I's & O's and daily weights. Attempt to avoid nephrotoxic agents. Refrain from NSAIDs. Repeat CMP in the morning. Check serum magnesium level. Add-on random urine sodium and random urine creatinine.  Continuous IV fluids, as above.  Further evaluation management of presenting rhabdomyolysis, as above.         #) Anion gap metabolic acidosis: Identified on initial CMP, without evidence of corresponding acidemia on presenting blood gas, as further quantified above.  Likely multifactorial nature, with contributions from rhabdomyolysis, initial lactic acidosis, AKI, and additional potential contribution from suspected cocaine use, as above.   Plan: Further evaluation management of AKI, rhabdomyolysis, as above.  IV fluids, as above.  Urinary drug screen.  Repeat CMP/CBC in the morning.  Repeat INR in the morning.            #) SIRS criteria present: Leukocytosis, mild tachycardia, tachypnea.  However, in the absence of evidence of underlying  infectious process, criteria for sepsis not currently met.  This includes urinalysis that was not suggestive of UTI, which is x-ray showed no evidence of acute cardiopulmonary process, COVID-19/flu PCR negative.  Leukocytosis, tachycardia, tachypnea with likely noninfectious contributions, including that  from rhabdomyolysis as well as the suspected underlying cocaine use.  We will refrain from additional IV antibiotics at this time.  Plan: Repeat CBC with differential in the morning.  Monitor results of cultures x2 collected in the emergency room to this evening.  Repeat CMP in the morning.  Monitor oximetry.  Further evaluation management of rhabdomyolysis, as above.         #) Elevated troponin: Mildly elevated troponin values, as quantified above.  Suspect that this is on the basis of type II Cytovene mismatch as a consequence of initial hypotension, potential cocaine abuse, as well as contribution from diminished renal clearance in the setting of presenting acute kidney injury, as above.  EKG shows no evidence of acute ischemic changes, including no evidence of ST elevation.  Overall, ACS felt to be less likely, but will continue to trend troponin and pursue echocardiogram in the morning.  Plan: Trend troponin.  Echocardiogram the morning.  Monitor on symmetry.  Add on serum magnesium level.          #) Potential Wellbutrin overdose: The patient reportedly conveyed that he had taken 5 Wellbutrin pills earlier in the evening, without conveyance of associated intent to harm himself with this measure.  EDP discussed with poison control, with recommendations for every 4 hour EKGs to monitor for QTc/QRS prolongation, as above.  Plan: Every 4 hour EKGs ordered through noon today.  Monitor on symmetry.  Holding home Prozac for now.  Add on serum magnesium level.  Monitor oximetry.         #) Acute transaminitis: Elevation in AST/ALT with AST predominance, in the absence of any  elevation in alkaline phosphatase or bilirubin to suggest a cholestatic pattern.  Suspect that this is on the basis of cocaine abuse with potential additional contribution from initial hepatic hypoperfusion in the setting of initial hypotension essentially resolved.  Of note, serum acetaminophen level less than 10.  However, started on N-acetylcysteine per recommendations of poison control, as further detailed above.  Plan: Repeat CMP in the morning.  Repeat INR in the morning.  Follow-up result urinary drug screen.              #) History of essential hypertension: Documented history of such, on Norvasc as an outpatient.  Following initial hypotension, patient has been hypertensive, with systolic blood pressures in the 150s to 180s.  Suspect attribution from recreational drug abuse, including suspected cocaine abuse, with urinary drug screen currently pending.  Given initial hypotension, will refrain from aggressive blood pressure management at this time, but will order as needed IV labetalol, specifically choosing a nonselective beta-blocker given concern for underlying cocaine abuse.  Clinically, presentation appears less suggestive of acute stroke, will also noting that presenting CTh showed evidence of acute intracranial process.  Plan: Close monitoring and seeing blood pressure via routine vital signs.  Monitor on symmetry.  As needed IV labetalol.  Follow-up result of urine drug screen.  Check TSH.  Every 4 hour neurochecks ordered.       DVT prophylaxis: SCD's   Code Status: Full code Family Communication: none Disposition Plan: Per Rounding Team Consults called: EDP discussed patient's case with on-call PCCM, who after evaluation are recommended admission to the hospitalist service, as further detailed above.;  Admission status: Inpatient    PLEASE NOTE THAT DRAGON DICTATION SOFTWARE WAS USED IN THE CONSTRUCTION OF THIS NOTE.   [place] DO [facility]  From [place]   03/23/2022, 5:53 AM

## 2022-03-23 NOTE — ED Provider Notes (Signed)
[facility] EMERGENCY DEPARTMENT Provider Note   CSN: [id number] Arrival date & time: 03/23/22  0003     History  Chief Complaint  Patient presents with   Altered Mental Status    Charles Estes is a 61 y.o. male.  The history is limited by the condition of the patient.  Altered Mental Status Presenting symptoms: confusion and partial responsiveness   Severity:  Severe Most recent episode:  Today Episode history:  Single Timing:  Constant Progression:  Unchanged Chronicity:  New Context: not recent illness and not recent infection   Associated symptoms: no bladder incontinence, no fever, no rash and no vomiting   Associated symptoms comment:  Hypotension  Patient with HTN and Bipolar disorder and h/o cocaine abuse presents with AMS and hypotension.       Home Medications Prior to Admission medications   Medication Sig Start Date End Date Taking? Authorizing Provider  amLODipine (NORVASC) 10 MG tablet Take 1 tablet (10 mg total) by mouth daily. 01/25/22   Dian Situ, MD  ARIPiprazole (ABILIFY) 10 MG tablet Take 1 tablet (10 mg total) by mouth daily. 01/25/22   Dian Situ, MD  aspirin EC 81 MG tablet Take 81 mg by mouth daily. Swallow whole.    [provider]  FLUoxetine (PROZAC) 10 MG capsule Take 1 capsule (10 mg total) by mouth daily. 01/25/22   Dian Situ, MD  pantoprazole (PROTONIX) 40 MG tablet Take 1 tablet (40 mg total) by mouth daily. 01/25/22   Dian Situ, MD  traZODone (DESYREL) 100 MG tablet Take 2 tablets (200 mg total) by mouth at bedtime as needed for sleep. 01/24/22   Dian Situ, MD  Vitamin D, Ergocalciferol, (DRISDOL) 1.25 MG (50000 UNIT) CAPS capsule Take 1 capsule (50,000 Units total) by mouth every 7 (seven) days. 01/28/22   Dian Situ, MD  calcium-vitamin D (OSCAL WITH D) 500-200 MG-UNIT tablet Take 1 tablet by mouth 3 (three) times daily. Patient not taking: Reported on 11/20/2017 07/16/17 10/23/20  Leandrew Koyanagi, MD   promethazine (PHENERGAN) 25 MG tablet Take 1 tablet (25 mg total) by mouth every 6 (six) hours as needed for nausea. Patient not taking: Reported on 11/20/2017 07/16/17 10/23/20  Leandrew Koyanagi, MD      Allergies    Paxil [paroxetine]    Review of Systems   Review of Systems  Unable to perform ROS: Acuity of condition  Constitutional:  Negative for fever.  HENT:  Negative for facial swelling.   Eyes:  Negative for redness.  Respiratory:  Negative for wheezing and stridor.   Cardiovascular:  Negative for leg swelling.  Gastrointestinal:  Negative for vomiting.  Genitourinary:  Negative for bladder incontinence.  Musculoskeletal:  Positive for arthralgias.  Skin:  Negative for rash.  Psychiatric/Behavioral:  Positive for confusion.     Physical Exam Updated Vital Signs BP (!) 158/106   Pulse 86   Temp 98 F (36.7 C)   Resp 14   SpO2 92%  Physical Exam Vitals and nursing note reviewed. Exam conducted with a chaperone present.  Constitutional:      General: He is not in acute distress.    Appearance: He is well-developed. He is not diaphoretic.  HENT:     Head: Normocephalic and atraumatic.     Nose: Nose normal.  Eyes:     Conjunctiva/sclera: Conjunctivae normal.     Pupils: Pupils are equal, round, and reactive to light.  Cardiovascular:     Rate and Rhythm: Normal  rate and regular rhythm.     Pulses: Normal pulses.     Heart sounds: Normal heart sounds.  Pulmonary:     Effort: Pulmonary effort is normal.     Breath sounds: Normal breath sounds. No wheezing or rales.  Abdominal:     General: Bowel sounds are normal.     Palpations: Abdomen is soft.     Tenderness: There is no abdominal tenderness. There is no guarding or rebound.  Musculoskeletal:        General: Normal range of motion.     Cervical back: Normal range of motion and neck supple.  Skin:    General: Skin is warm and dry.     Capillary Refill: Capillary refill takes less than 2 seconds.   Neurological:     Mental Status: He is alert.     Deep Tendon Reflexes: Reflexes normal.     Comments: Wakes to verbal stimuli     ED Results / Procedures / Treatments   Labs (all labs ordered are listed, but only abnormal results are displayed) Results for orders placed or performed during the hospital encounter of 03/23/22  Resp Panel by RT-PCR (Flu A&B, Covid)   Specimen: Nasal Swab  Result Value Ref Range   SARS Coronavirus 2 by RT PCR NEGATIVE NEGATIVE   Influenza A by PCR NEGATIVE NEGATIVE   Influenza B by PCR NEGATIVE NEGATIVE  Comprehensive metabolic panel  Result Value Ref Range   Sodium 136 135 - 145 mmol/L   Potassium 3.8 3.5 - 5.1 mmol/L   Chloride 97 (L) 98 - 111 mmol/L   CO2 21 (L) 22 - 32 mmol/L   Glucose, Bld 131 (H) 70 - 99 mg/dL   BUN 33 (H) 6 - 20 mg/dL   Creatinine, Ser 0.73 (H) 0.61 - 1.24 mg/dL   Calcium 9.3 8.9 - 71.0 mg/dL   Total Protein 7.2 6.5 - 8.1 g/dL   Albumin 3.8 3.5 - 5.0 g/dL   AST 626 (H) 15 - 41 U/L   ALT 138 (H) 0 - 44 U/L   Alkaline Phosphatase 75 38 - 126 U/L   Total Bilirubin 0.6 0.3 - 1.2 mg/dL   GFR, Estimated 16 (L) >60 mL/min   Anion gap 18 (H) 5 - 15  Salicylate level  Result Value Ref Range   Salicylate Lvl 9.5 7.0 - 30.0 mg/dL  Acetaminophen level  Result Value Ref Range   Acetaminophen (Tylenol), Serum <10 (L) 10 - 30 ug/mL  Ethanol  Result Value Ref Range   Alcohol, Ethyl (B) <10 <10 mg/dL  CBC WITH DIFFERENTIAL  Result Value Ref Range   WBC 12.1 (H) 4.0 - 10.5 K/uL   RBC 4.93 4.22 - 5.81 MIL/uL   Hemoglobin 15.5 13.0 - 17.0 g/dL   HCT 94.8 54.6 - 27.0 %   MCV 93.7 80.0 - 100.0 fL   MCH 31.4 26.0 - 34.0 pg   MCHC 33.5 30.0 - 36.0 g/dL   RDW 35.0 09.3 - 81.8 %   Platelets 266 150 - 400 K/uL   nRBC 0.0 0.0 - 0.2 %   Neutrophils Relative % 80 %   Neutro Abs 9.6 (H) 1.7 - 7.7 K/uL   Lymphocytes Relative 12 %   Lymphs Abs 1.5 0.7 - 4.0 K/uL   Monocytes Relative 8 %   Monocytes Absolute 1.0 0.1 - 1.0 K/uL    Eosinophils Relative 0 %   Eosinophils Absolute 0.0 0.0 - 0.5 K/uL   Basophils Relative 0 %  Basophils Absolute 0.0 0.0 - 0.1 K/uL   Immature Granulocytes 0 %   Abs Immature Granulocytes 0.04 0.00 - 0.07 K/uL  Lactic acid, plasma  Result Value Ref Range   Lactic Acid, Venous 2.6 (HH) 0.5 - 1.9 mmol/L  Lactic acid, plasma  Result Value Ref Range   Lactic Acid, Venous 1.8 0.5 - 1.9 mmol/L  Protime-INR  Result Value Ref Range   Prothrombin Time 13.9 11.4 - 15.2 seconds   INR 1.1 0.8 - 1.2  APTT  Result Value Ref Range   aPTT 26 24 - 36 seconds  Comprehensive metabolic panel  Result Value Ref Range   Sodium 135 135 - 145 mmol/L   Potassium 3.7 3.5 - 5.1 mmol/L   Chloride 102 98 - 111 mmol/L   CO2 20 (L) 22 - 32 mmol/L   Glucose, Bld 95 70 - 99 mg/dL   BUN 33 (H) 6 - 20 mg/dL   Creatinine, Ser 3.78 (H) 0.61 - 1.24 mg/dL   Calcium 8.6 (L) 8.9 - 10.3 mg/dL   Total Protein 7.0 6.5 - 8.1 g/dL   Albumin 3.7 3.5 - 5.0 g/dL   AST 394 (H) 15 - 41 U/L   ALT 136 (H) 0 - 44 U/L   Alkaline Phosphatase 72 38 - 126 U/L   Total Bilirubin 0.6 0.3 - 1.2 mg/dL   GFR, Estimated 18 (L) >60 mL/min   Anion gap 13 5 - 15  CK  Result Value Ref Range   Total CK >50,000 (H) 49 - 99991111 U/L  Salicylate level  Result Value Ref Range   Salicylate Lvl Q000111Q (L) 7.0 - 30.0 mg/dL  Ammonia  Result Value Ref Range   Ammonia 16 9 - 35 umol/L  I-stat chem 8, ed  Result Value Ref Range   Sodium 135 135 - 145 mmol/L   Potassium 4.0 3.5 - 5.1 mmol/L   Chloride 99 98 - 111 mmol/L   BUN 35 (H) 6 - 20 mg/dL   Creatinine, Ser 4.10 (H) 0.61 - 1.24 mg/dL   Glucose, Bld 171 (H) 70 - 99 mg/dL   Calcium, Ion 1.05 (L) 1.15 - 1.40 mmol/L   TCO2 24 22 - 32 mmol/L   Hemoglobin 15.6 13.0 - 17.0 g/dL   HCT 46.0 39.0 - 52.0 %  I-Stat arterial blood gas, ED [facility] ED only)  Result Value Ref Range   pH, Arterial 7.365 7.35 - 7.45   pCO2 arterial 32.9 32 - 48 mmHg   pO2, Arterial 79 (L) 83 - 108 mmHg   Bicarbonate 18.9  (L) 20.0 - 28.0 mmol/L   TCO2 20 (L) 22 - 32 mmol/L   O2 Saturation 95 %   Acid-base deficit 6.0 (H) 0.0 - 2.0 mmol/L   Sodium 136 135 - 145 mmol/L   Potassium 4.0 3.5 - 5.1 mmol/L   Calcium, Ion 1.14 (L) 1.15 - 1.40 mmol/L   HCT 37.0 (L) 39.0 - 52.0 %   Hemoglobin 12.6 (L) 13.0 - 17.0 g/dL   Patient temperature 98.0 F    Sample type ARTERIAL   Troponin I (High Sensitivity)  Result Value Ref Range   Troponin I (High Sensitivity) 109 (HH) <18 ng/L  Troponin I (High Sensitivity)  Result Value Ref Range   Troponin I (High Sensitivity) 125 (HH) <18 ng/L   CT HEAD WO CONTRAST (5MM)  Result Date: 03/23/2022 CLINICAL DATA:  [place] and hit head. EXAM: CT HEAD WITHOUT CONTRAST TECHNIQUE: Contiguous axial images were obtained from the base  of the skull through the vertex without intravenous contrast. RADIATION DOSE REDUCTION: This exam was performed according to the departmental dose-optimization program which includes automated exposure control, adjustment of the mA and/or kV according to patient size and/or use of iterative reconstruction technique. COMPARISON:  None Available. FINDINGS: Brain: No evidence of acute infarction, hemorrhage, hydrocephalus, extra-axial collection or mass lesion/mass effect. There are mildly prominent perivascular spaces in the anterior basal ganglia. Mild cerebral cortical atrophy with minimal small vessel disease in the deep white matter. Vascular: There are scattered calcifications in both siphons. No hyperdense central vasculature. Skull: There is no visible scalp hematoma. No fracture or focal skull lesion is seen. Sinuses/Orbits: There are chronic depressed fractures of both orbital floors. The orbital contents are unremarkable. Visible sinuses and mastoid air cells are clear. Other: None. IMPRESSION: 1. No acute intracranial CT findings. 2. No depressed skull fractures. 3. Chronic depressed fractures of both orbital floors. Electronically Signed   By: Telford Nab  M.D.   On: 03/23/2022 03:56   DG Elbow Complete Left  Result Date: 03/23/2022 CLINICAL DATA:  Pain after fall EXAM: LEFT ELBOW - COMPLETE 3+ VIEW COMPARISON:  None Available. FINDINGS: There is no evidence of fracture, dislocation, or joint effusion. There is no evidence of arthropathy or other focal bone abnormality. Soft tissues are unremarkable. IMPRESSION: No acute fracture or dislocation. Electronically Signed   By: Placido Sou M.D.   On: 03/23/2022 02:08   DG Wrist Complete Left  Result Date: 03/23/2022 CLINICAL DATA:  Pain after fall EXAM: LEFT WRIST - COMPLETE 3+ VIEW COMPARISON:  None Available. FINDINGS: There is no evidence of fracture or dislocation. Widening of the scapholunate interval. Degenerative arthritis first CMC joint. Soft tissues are unremarkable. IMPRESSION: No acute fracture or dislocation. Electronically Signed   By: Placido Sou M.D.   On: 03/23/2022 02:08   DG Chest Port 1 View  Result Date: 03/23/2022 CLINICAL DATA:  Questionable sepsis. EXAM: PORTABLE CHEST 1 VIEW COMPARISON:  Chest radiograph dated 01/22/2022. FINDINGS: No focal consolidation, pleural effusion, pneumothorax. The cardiac silhouette is within normal limits. No acute osseous pathology. IMPRESSION: No active disease. Electronically Signed   By: Anner Crete M.D.   On: 03/23/2022 01:05    EKG EKG Interpretation  Date/Time:  Saturday March 23 2022 00:27:43 EDT Ventricular Rate:  101 PR Interval:  136 QRS Duration: 76 QT Interval:  352 QTC Calculation: 456 R Axis:   45 Text Interpretation: Sinus tachycardia Confirmed by Dory Horn) on 03/23/2022 1:37:02 AM  Radiology DG Elbow Complete Left  Result Date: 03/23/2022 CLINICAL DATA:  Pain after fall EXAM: LEFT ELBOW - COMPLETE 3+ VIEW COMPARISON:  None Available. FINDINGS: There is no evidence of fracture, dislocation, or joint effusion. There is no evidence of arthropathy or other focal bone abnormality. Soft tissues are  unremarkable. IMPRESSION: No acute fracture or dislocation. Electronically Signed   By: Placido Sou M.D.   On: 03/23/2022 02:08   DG Wrist Complete Left  Result Date: 03/23/2022 CLINICAL DATA:  Pain after fall EXAM: LEFT WRIST - COMPLETE 3+ VIEW COMPARISON:  None Available. FINDINGS: There is no evidence of fracture or dislocation. Widening of the scapholunate interval. Degenerative arthritis first CMC joint. Soft tissues are unremarkable. IMPRESSION: No acute fracture or dislocation. Electronically Signed   By: Placido Sou M.D.   On: 03/23/2022 02:08   DG Chest Port 1 View  Result Date: 03/23/2022 CLINICAL DATA:  Questionable sepsis. EXAM: PORTABLE CHEST 1 VIEW COMPARISON:  Chest radiograph  dated 01/22/2022. FINDINGS: No focal consolidation, pleural effusion, pneumothorax. The cardiac silhouette is within normal limits. No acute osseous pathology. IMPRESSION: No active disease. Electronically Signed   By: Anner Crete M.D.   On: 03/23/2022 01:05    Procedures Procedures    Medications Ordered in ED Medications  lactated ringers bolus 1,000 mL (has no administration in time range)  lactated ringers infusion (has no administration in time range)  lactated ringers bolus 1,000 mL (1,000 mLs Intravenous Not Given 03/23/22 0251)    And  lactated ringers bolus 1,000 mL (has no administration in time range)    And  lactated ringers bolus 1,000 mL (has no administration in time range)  vancomycin (VANCOREADY) IVPB 1500 mg/300 mL (has no administration in time range)  naloxone (NARCAN) 2 MG/2ML injection (has no administration in time range)  thiamine (VITAMIN B1) injection 100 mg (has no administration in time range)  lactated ringers infusion (has no administration in time range)  lactated ringers bolus 1,000 mL (has no administration in time range)    And  lactated ringers bolus 1,000 mL (1,000 mLs Intravenous New Bag/Given 03/23/22 0201)    And  lactated ringers bolus 1,000 mL  (1,000 mLs Intravenous New Bag/Given 03/23/22 0201)  naloxone (NARCAN) injection 0.4 mg (0.4 mg Intravenous Given 03/23/22 0043)  ceFEPIme (MAXIPIME) 2 g in sodium chloride 0.9 % 100 mL IVPB (2 g Intravenous New Bag/Given 03/23/22 0201)  metroNIDAZOLE (FLAGYL) IVPB 500 mg (500 mg Intravenous New Bag/Given 03/23/22 0201)    ED Course/ Medical Decision Making/ A&P                           Medical Decision Making AMS dropped off and hypotension in triage and diaphoretic   Amount and/or Complexity of Data Reviewed Independent Historian:     Details: Unable as person left  External Data Reviewed: notes.    Details: Previous notes reviewed  Labs: ordered.    Details: All labs reviewed:     SARS Coronavirus 2  NEGATIVE  Influenza A by PCR NEGATIVE   Influenza B by PCR NEGATIVE    Sodium 136 135 - 145 mmol/L  Potassium 3.8 Normal   Glucose, Bld 131 high   BUN 33 high   Creatinine, Ser 4.03 high   AST 404 high   ALT 960 high    Salicylate Lvl 4.5WUJ negative   Acetaminophen (Tylenol), Serum <10 normal  CBC WITH DIFFERENTIAL  WBC 12.1 high   Hemoglobin 15.5 Normal   Platelets 266 Normal      Lactic acid, plasma  Lactic Acid, Venous 2.6 (high first lactate  Lactic acid, plasma Result Value Ref Range  Lactic Acid, Venous 1.8 Repeat lactate normal  Protime-INR  Prothrombin Time 13.9 Normal   INR 1.1 Normal  Comprehensive metabolic panel  Sodium 811 Normal   Potassium 3.7 Normal   Chloride 102 Normal   CO2 20 (L) 22 - 32 mmol/L  Glucose, Bld 95 70 - 99 mg/dL  BUN 33 (H) 6 - 20 mg/dL  Creatinine, Ser 3.78 (High)  ALT 136 (High )    Total Bilirubin 0.6 normal  Total CK >50,000 markedly elevated rhabdomyolysis  Salicylate level  <9.1 normal second salicylate first was 9.5 and elevated  Ammonia normal 16   Troponin I (High Sensitivity) 109 high   Troponin I (High Sensitivity) 125 high second troponin   Radiology: ordered and independent interpretation performed.     Details: Negative  CXR by me. Negative head ct by me  Discussion of management or test interpretation with external provider(s): Spoke with poison control regarding non negative salicylate in the setting of AMS with ARF.  Q4 hr ekgs, start NAC based on LFTs.  Salicylate in 2 hours.  Repeat LFTs in 24 hours.   Spoke with Dr. Loanne Drilling who will see the patient please admit to medicine Dr. Glo Herring to accept   Risk Prescription drug management. Decision regarding hospitalization. Risk Details: When patient came in was hypotensive and very altered.  Given narcan and 200 NSS prior to being brought to the room.  Remained altered but was normotensive with normal HR.  Sepsis initiated and OD panel initiated.    Critical Care Total time providing critical care: 90 minutes (Sepsis bundle, high complexity of diagnosis, potential for serious outcomes and treatment modalities and consults. )    Final Clinical Impression(s) / ED Diagnoses Final diagnoses:  Altered mental status, unspecified altered mental status type  AKI (acute kidney injury) ([place])  Hepatitis    The patient appears reasonably stabilized for admission considering the current resources, flow, and capabilities available in the ED at this time, and I doubt any other [facility] requiring further screening and/or treatment in the ED prior to admission.  Rx / DC Orders ED Discharge Orders     None         Pansie Guggisberg, MD 03/23/22 [phone]

## 2022-03-23 NOTE — ED Triage Notes (Signed)
Pt altered, difficult to arouse, pt has vomit in his beard, reports chest pain that started yesterday. Hypotensive in triage.

## 2022-03-23 NOTE — Progress Notes (Signed)
03/23/2022 7:09 PM  Spoke with Dr. Darrick Meigs who spoke with poison control.  Per their guidance, the N-acetylcysteine could be discontinued.  [employer], Pharm.D., BCPS

## 2022-03-23 NOTE — ED Notes (Signed)
Pt refusing to wear monitors at this time.

## 2022-03-23 NOTE — Sepsis Progress Note (Signed)
Following per sepsis protocol   

## 2022-03-23 NOTE — Progress Notes (Signed)
Pharmacy Antibiotic Note  Charles Estes is a 61 y.o. male admitted on 03/23/2022 with AMS hypotension AKI and sepsis.  Pharmacy has been consulted for Vancomycin and Cefepime  dosing.  Vancomycin 1500 mg IV given in ED at  0400  Plan: Cefepime 2 g IV q24h F/U renal function and redose Vancomycin as indicated  Weight: 89.4 kg (197 lb)  Temp (24hrs), Avg:98.4 F (36.9 C), Min:98 F (36.7 C), Max:98.8 F (37.1 C)  Recent Labs  Lab 03/23/22 0035 03/23/22 0103 03/23/22 0200 03/23/22 0441  WBC 12.1*  --   --   --   CREATININE 4.03* 4.10* 3.78*  --   LATICACIDVEN  --   --  2.6* 1.8    Estimated Creatinine Clearance: 22 mL/min (A) (by C-G formula based on SCr of 3.78 mg/dL (H)).    Allergies  Allergen Reactions   Paxil [Paroxetine]     Per pt sores in mouth and diarrhea       Caryl Pina 03/23/2022 6:24 AM

## 2022-03-23 NOTE — Progress Notes (Addendum)
Subjective: Patient admitted this morning, see detailed H&P by Dr Velia Meyer  61 year old male with medical history of bipolar disorder, polysubstance abuse, including cocaine abuse, essential hypertension who was admitted to [facility] on 03/23/2022 with acute metabolic encephalopathy in setting of rhabdomyolysis, UDS was positive for cocaine.  Patient apparently was dropped off to the ED by unidentified individual for evaluation of altered mental status.  Patient was initially found to be hypotensive with SBP in 60s.  Patient reportedly improved with 1 dose of Narcan, however mental status did not improve significantly.  Patient reportedly took 5 Wellbutrin tablets earlier in the evening, but no intent to harm himself   Lab work in the ED was significant for AST 404, ALT 138, CPK greater than 08,657, salicylate level 9.5, repeat level less than 7, Tylenol level less than 10.  BUN/creatinine elevated to 33/4.03  Additionally, while acetaminophen level was nonelevated, poison control conveyed that such finding could be present in the setting of stage III liver failure, and given the patient's presenting transaminitis, recommended empiric initiation of N-acetylcysteine.  EDP discussed case with poison control, who recommended every 4 hours EKG monitoring to evaluate for subsequent development of QTc/QRS prolongation, with recommendation to initiate bicarbonate drip if found to have QTc/QRS prolongation  Vitals:   03/23/22 1345 03/23/22 1511  BP:  (!) 169/99  Pulse: (!) 103 99  Resp: (!) 23 (!) 21  Temp:  98.7 F (37.1 C)  SpO2: 99% 97%      A/P  Acute metabolic encephalopathy -Improving, likely in setting of cocaine abuse/rhabdomyolysis/acute kidney injury/?  Wellbutrin overdose -Patient on aggressive IV hydration with LR at 150 mill per hour  Severe rhabdomyolysis -CPK was elevated greater than 50,000 -Likely in setting of cocaine abuse in the setting of acute kidney  injury -Repeat CK was down to 44,000 -Continue aggressive hydration with LR at 150 mill per hour  ?  Sepsis -Patient presented with profound hypotension with elevated lactic acid 2.6 -Blood pressure significantly improved -Empirically started on vancomycin and cefepime -Blood cultures obtained -Consider stopping antibiotics in next 24 hours if culture remain negative  Transaminitis -Patient has significant elevated AST and ALT; likely in setting of rhabdomyolysis -Or could be shock liver as patient was hypotensive with SBP in 60s at the time of presentation in the ED -LFTs slowly improving; empirically started on N-acetylcysteine for stage III liver failure even with normal acetaminophen level; follow LFTs in a.m. -Consider stopping N-acetylcysteine for 24 hours if LFTs significantly improve -Acetaminophen level was less than 10  Acute kidney injury -Patient baseline creatinine around 1.1 as of August 2023 -Presented with creatinine of 4.03; likely in setting of cocaine abuse as well as hypotension -Improving with aggressive IV hydration with LR -Continue LR at 150 mill per hour  ?  Wellbutrin overdose -Patient apparently took 5 tablets of Wellbutrin -Initial EKG was unremarkable -Poison control recommended to check EKG every 4 hours and if QRS/QTc prolonged consider bicarb drip -We will repeat EKG  Anion gap metabolic acidosis -Likely in setting of cocaine abuse, acute kidney injury, lactic acidosis, rhabdomyolysis hypotension -Improving with IV hydration, and gap is down to 13  Elevated troponin -Likely in setting of rhabdomyolysis -Denies chest pain  Substance abuse -UDS positive for cocaine -Once mental status improves, consider social work consult for outpatient rehab resources  [place]

## 2022-03-23 NOTE — ED Provider Triage Note (Signed)
  Emergency Medicine Provider Triage Evaluation Note  MRN:  [id number]  Arrival date & time: 03/23/22    Medically screening exam initiated at 12:44 AM.   CC:   Altered Mental Status   HPI:  Charles Estes is a 61 y.o. year-old male presents to the ED with chief complaint of AMS. Unable to obtain hx.  BP 65 systolic.     ROS:  -As included in HPI PE:   Vitals:   03/23/22 0031  BP: (!) 65/47  Pulse: (!) 120  Resp: 16  Temp: 98 F (36.7 C)  SpO2: 95%     Sepsis bundle, weight based fluids, CT, labs ordered.  Agricultural consultant notified.    12:45 AM Patient being moved to a room.   Montine Circle, PA-C 03/23/22 0045

## 2022-03-24 ENCOUNTER — Inpatient Hospital Stay (HOSPITAL_COMMUNITY): Payer: Medicare Other

## 2022-03-24 DIAGNOSIS — R7989 Other specified abnormal findings of blood chemistry: Secondary | ICD-10-CM

## 2022-03-24 DIAGNOSIS — I9589 Other hypotension: Secondary | ICD-10-CM | POA: Diagnosis not present

## 2022-03-24 DIAGNOSIS — G9340 Encephalopathy, unspecified: Secondary | ICD-10-CM | POA: Diagnosis not present

## 2022-03-24 LAB — URINALYSIS, ROUTINE W REFLEX MICROSCOPIC
Bilirubin Urine: NEGATIVE
Glucose, UA: NEGATIVE mg/dL
Ketones, ur: NEGATIVE mg/dL
Leukocytes,Ua: NEGATIVE
Nitrite: NEGATIVE
Protein, ur: NEGATIVE mg/dL
Specific Gravity, Urine: 1.005 (ref 1.005–1.030)
pH: 6 (ref 5.0–8.0)

## 2022-03-24 LAB — CBC WITH DIFFERENTIAL/PLATELET
Abs Immature Granulocytes: 0.03 10*3/uL (ref 0.00–0.07)
Basophils Absolute: 0 10*3/uL (ref 0.0–0.1)
Basophils Relative: 0 %
Eosinophils Absolute: 0.1 10*3/uL (ref 0.0–0.5)
Eosinophils Relative: 1 %
HCT: 35.6 % — ABNORMAL LOW (ref 39.0–52.0)
Hemoglobin: 12.3 g/dL — ABNORMAL LOW (ref 13.0–17.0)
Immature Granulocytes: 0 %
Lymphocytes Relative: 13 %
Lymphs Abs: 1.3 10*3/uL (ref 0.7–4.0)
MCH: 31.5 pg (ref 26.0–34.0)
MCHC: 34.6 g/dL (ref 30.0–36.0)
MCV: 91.3 fL (ref 80.0–100.0)
Monocytes Absolute: 0.9 10*3/uL (ref 0.1–1.0)
Monocytes Relative: 9 %
Neutro Abs: 7.8 10*3/uL — ABNORMAL HIGH (ref 1.7–7.7)
Neutrophils Relative %: 77 %
Platelets: 143 10*3/uL — ABNORMAL LOW (ref 150–400)
RBC: 3.9 MIL/uL — ABNORMAL LOW (ref 4.22–5.81)
RDW: 13.3 % (ref 11.5–15.5)
WBC: 10.1 10*3/uL (ref 4.0–10.5)
nRBC: 0 % (ref 0.0–0.2)

## 2022-03-24 LAB — LIPID PANEL
Cholesterol: 179 mg/dL (ref 0–200)
HDL: 34 mg/dL — ABNORMAL LOW (ref >40)
LDL Cholesterol: 116 mg/dL — ABNORMAL HIGH (ref 0–99)
Total CHOL/HDL Ratio: 5.3 RATIO
Triglycerides: 145 mg/dL (ref <150)
VLDL: 29 mg/dL (ref 0–40)

## 2022-03-24 LAB — URIC ACID: Uric Acid, Serum: 7.4 mg/dL (ref 3.7–8.6)

## 2022-03-24 LAB — COMPREHENSIVE METABOLIC PANEL
ALT: 99 U/L — ABNORMAL HIGH (ref 0–44)
AST: 238 U/L — ABNORMAL HIGH (ref 15–41)
Albumin: 2.3 g/dL — ABNORMAL LOW (ref 3.5–5.0)
Alkaline Phosphatase: 54 U/L (ref 38–126)
Anion gap: 13 (ref 5–15)
BUN: 39 mg/dL — ABNORMAL HIGH (ref 6–20)
CO2: 21 mmol/L — ABNORMAL LOW (ref 22–32)
Calcium: 8.5 mg/dL — ABNORMAL LOW (ref 8.9–10.3)
Chloride: 106 mmol/L (ref 98–111)
Creatinine, Ser: 5.16 mg/dL — ABNORMAL HIGH (ref 0.61–1.24)
GFR, Estimated: 12 mL/min — ABNORMAL LOW (ref >60)
Glucose, Bld: 120 mg/dL — ABNORMAL HIGH (ref 70–99)
Potassium: 3.6 mmol/L (ref 3.5–5.1)
Sodium: 140 mmol/L (ref 135–145)
Total Bilirubin: 0.5 mg/dL (ref 0.3–1.2)
Total Protein: 5.2 g/dL — ABNORMAL LOW (ref 6.5–8.1)

## 2022-03-24 LAB — ECHOCARDIOGRAM COMPLETE
AR max vel: 2.24 cm2
AV Area VTI: 2.02 cm2
AV Area mean vel: 2.91 cm2
AV Mean grad: 3.3 mmHg
AV Peak grad: 8.3 mmHg
Ao pk vel: 1.44 m/s
Area-P 1/2: 3.65 cm2
Weight: 3152 oz

## 2022-03-24 LAB — URINE CULTURE: Culture: NO GROWTH

## 2022-03-24 LAB — OSMOLALITY, URINE: Osmolality, Ur: 211 mOsm/kg — ABNORMAL LOW (ref 300–900)

## 2022-03-24 LAB — MRSA NEXT GEN BY PCR, NASAL: MRSA by PCR Next Gen: NOT DETECTED

## 2022-03-24 LAB — CK: Total CK: 26982 U/L — ABNORMAL HIGH (ref 49–397)

## 2022-03-24 LAB — C-REACTIVE PROTEIN: CRP: 7.1 mg/dL — ABNORMAL HIGH (ref <1.0)

## 2022-03-24 LAB — BRAIN NATRIURETIC PEPTIDE: B Natriuretic Peptide: 126.1 pg/mL — ABNORMAL HIGH (ref 0.0–100.0)

## 2022-03-24 LAB — CREATININE, URINE, RANDOM: Creatinine, Urine: 34 mg/dL

## 2022-03-24 LAB — OSMOLALITY: Osmolality: 302 mOsm/kg — ABNORMAL HIGH (ref 275–295)

## 2022-03-24 LAB — MAGNESIUM: Magnesium: 1.8 mg/dL (ref 1.7–2.4)

## 2022-03-24 LAB — SODIUM, URINE, RANDOM: Sodium, Ur: 79 mmol/L

## 2022-03-24 MED ORDER — TAMSULOSIN HCL 0.4 MG PO CAPS
0.4000 mg | ORAL_CAPSULE | Freq: Every day | ORAL | Status: DC
  Administered 2022-03-24 – 2022-03-28 (×5): 0.4 mg via ORAL
  Filled 2022-03-24 (×5): qty 1

## 2022-03-24 MED ORDER — ROSUVASTATIN CALCIUM 5 MG PO TABS: 10.0000 mg | ORAL_TABLET | Freq: Every day | ORAL | Status: DC

## 2022-03-24 MED ORDER — CARVEDILOL 6.25 MG PO TABS
6.2500 mg | ORAL_TABLET | Freq: Two times a day (BID) | ORAL | Status: DC
  Administered 2022-03-24 – 2022-03-28 (×7): 6.25 mg via ORAL
  Filled 2022-03-24 (×8): qty 1

## 2022-03-24 MED ORDER — HYDRALAZINE HCL 50 MG PO TABS
50.0000 mg | ORAL_TABLET | Freq: Three times a day (TID) | ORAL | Status: DC
  Administered 2022-03-24 – 2022-03-28 (×12): 50 mg via ORAL
  Filled 2022-03-24 (×12): qty 1

## 2022-03-24 MED ORDER — DOXYCYCLINE HYCLATE 100 MG PO TABS
100.0000 mg | ORAL_TABLET | Freq: Two times a day (BID) | ORAL | Status: DC
  Administered 2022-03-24 – 2022-03-28 (×9): 100 mg via ORAL
  Filled 2022-03-24 (×9): qty 1

## 2022-03-24 MED ORDER — ACETAMINOPHEN 325 MG PO TABS
650.0000 mg | ORAL_TABLET | Freq: Four times a day (QID) | ORAL | Status: DC | PRN
  Administered 2022-03-24 – 2022-03-28 (×7): 650 mg via ORAL
  Filled 2022-03-24 (×9): qty 2

## 2022-03-24 MED ORDER — HYDROCODONE-ACETAMINOPHEN 5-325 MG PO TABS
1.0000 | ORAL_TABLET | Freq: Four times a day (QID) | ORAL | Status: DC | PRN
  Administered 2022-03-24 – 2022-03-27 (×4): 1 via ORAL
  Filled 2022-03-24 (×4): qty 1

## 2022-03-24 MED ORDER — HEPARIN SODIUM (PORCINE) 5000 UNIT/ML IJ SOLN
5000.0000 [IU] | Freq: Three times a day (TID) | INTRAMUSCULAR | Status: DC
  Administered 2022-03-24 – 2022-03-26 (×7): 5000 [IU] via SUBCUTANEOUS
  Filled 2022-03-24 (×11): qty 1

## 2022-03-24 MED ORDER — AMLODIPINE BESYLATE 10 MG PO TABS
10.0000 mg | ORAL_TABLET | Freq: Every day | ORAL | Status: DC
  Administered 2022-03-24 – 2022-03-28 (×5): 10 mg via ORAL
  Filled 2022-03-24 (×5): qty 1

## 2022-03-24 MED ORDER — LACTATED RINGERS IV SOLN: INTRAVENOUS | Status: DC

## 2022-03-24 MED ORDER — CARVEDILOL 6.25 MG PO TABS
6.2500 mg | ORAL_TABLET | Freq: Two times a day (BID) | ORAL | Status: DC
  Administered 2022-03-24: 6.25 mg via ORAL
  Filled 2022-03-24: qty 1

## 2022-03-24 MED ORDER — ASPIRIN 81 MG PO CHEW
81.0000 mg | CHEWABLE_TABLET | Freq: Every day | ORAL | Status: DC
  Administered 2022-03-24 – 2022-03-28 (×5): 81 mg via ORAL
  Filled 2022-03-24 (×5): qty 1

## 2022-03-24 MED ORDER — HYDRALAZINE HCL 20 MG/ML IJ SOLN
10.0000 mg | Freq: Four times a day (QID) | INTRAMUSCULAR | Status: DC | PRN
  Administered 2022-03-24: 10 mg via INTRAVENOUS
  Filled 2022-03-24: qty 1

## 2022-03-24 NOTE — Progress Notes (Signed)
Echocardiogram 2D Echocardiogram has been performed.  Oneal Deputy Nathalie Cavendish RDCS 03/24/2022, 2:42 PM

## 2022-03-24 NOTE — Progress Notes (Signed)
PROGRESS NOTE                                                                                                                                                                                                             Patient Demographics:    Charles Estes, is a 61 y.o. male, DOB - [birth year], UXL:[id number]  Outpatient Primary MD for the patient is El-Khoury, Irene Shipper, MD    LOS - 1  Admit date - 03/23/2022    Chief Complaint  Patient presents with   Altered Mental Status       Brief Narrative (HPI from H&P)   61 year old male with medical history of bipolar disorder, polysubstance abuse, including cocaine abuse, essential hypertension who was admitted to [facility] on 03/23/2022 with acute metabolic encephalopathy in setting of rhabdomyolysis, UDS was positive for cocaine.  He was dropped in the ER by unknown individual in a confused state, further work-up suggested rhabdomyolysis, toxic and metabolic encephalopathy along with acute renal failure.   Subjective:    Dayshon Zerbe today has, No headache, No chest pain, No abdominal pain - No Nausea, No new weakness tingling or numbness, no SOB.   Assessment  & Plan :   Toxic and metabolic encephalopathy caused by severe dehydration, cocaine and polysubstance abuse - last use on the 19th, says he takes cocaine once a month and plans not to do so again, no intentional drug overdose or suicidal ideation.  Currently mentation back to normal, head CT unremarkable.  No headache.  Counseled to quit all polysubstance abuse.  Clearly counseled not to use cocaine with Coreg.   Rhabdomyolysis in conjunction with severe dehydration with hypotension, AKI - continue IV fluids, blood pressure has stabilized, postvoid morning bladder scan is 40 cc, renal function still worsening, will obtain renal ultrasound along with urine electrolytes.  No signs of sepsis clinically.  Questionable  Wellbutrin overdose.  Patient denies.  EKG stable.  Monitor.   Mild non-ACS pattern troponin elevation.  No chest pain likely due to cocaine abuse, chest pain-free, placed on aspirin, beta-blocker and statin.  Stable EKG.  Check lipid panel.  Check echocardiogram.  Mild asymptomatic transaminitis.  Due to rhabdomyolysis.  Monitor.      Condition - Fair  Family Communication  :  None  Code Status :  Full  Consults  :  None  PUD Prophylaxis :    Procedures  :     Renal ultrasound.    CT head.  Nonacute.      Disposition Plan  :    Status is: Inpatient  DVT Prophylaxis  :    SCDs Start: 03/23/22 0551    Lab Results  Component Value Date   PLT 143 (L) 03/24/2022    Diet :  Diet Order             DIET SOFT Room service appropriate? Yes; Fluid consistency: Thin  Diet effective now                    Inpatient Medications  Scheduled Meds:  amLODipine  10 mg Oral Daily   carvedilol  6.25 mg Oral BID WC   Continuous Infusions:  lactated ringers 100 mL/hr at 03/24/22 0616   PRN Meds:.acetaminophen, hydrALAZINE, labetalol    Objective:   Vitals:   03/24/22 0050 03/24/22 0309 03/24/22 0615 03/24/22 0700  BP: (!) 168/124 (!) 158/110 (!) 172/113 (!) 150/100  Pulse:   88 96  Resp: 15 13  16   Temp: 98.5 F (36.9 C) 98.4 F (36.9 C)  98.5 F (36.9 C)  TempSrc: Oral Axillary  Oral  SpO2: 96% 97%  95%  Weight:        Wt Readings from Last 3 Encounters:  03/23/22 89.4 kg  01/18/22 89.4 kg  01/17/22 93 kg     Intake/Output Summary (Last 24 hours) at 03/24/2022 0851 Last data filed at 03/24/2022 0744 Gross per 24 hour  Intake 4729.16 ml  Output 1550 ml  Net 3179.16 ml     Physical Exam  Awake Alert, No new F.N deficits, Normal affect [place].AT,PERRAL Supple Neck, No JVD,   Symmetrical Chest wall movement, Good air movement bilaterally, CTAB RRR,No Gallops,Rubs or new Murmurs,  +ve B.Sounds, Abd Soft, No tenderness,   No Cyanosis, Clubbing  or edema     RN pressure injury documentation: Pressure Injury 03/23/22 Buttocks Bilateral Stage 1 -  Intact skin with non-blanchable redness of a localized area usually over a bony prominence. (Active)  03/23/22 1640  Location: Buttocks  Location Orientation: Bilateral  Staging: Stage 1 -  Intact skin with non-blanchable redness of a localized area usually over a bony prominence.  Wound Description (Comments):   Present on Admission: Yes      Data Review:    CBC Recent Labs  Lab 03/23/22 0035 03/23/22 0103 03/23/22 0230 03/23/22 1820 03/24/22 0553  WBC 12.1*  --   --  10.8* 10.1  HGB 15.5 15.6 12.6* 13.4 12.3*  HCT 46.2 46.0 37.0* 39.0 35.6*  PLT 266  --   --  133* 143*  MCV 93.7  --   --  91.5 91.3  MCH 31.4  --   --  31.5 31.5  MCHC 33.5  --   --  34.4 34.6  RDW 13.4  --   --  13.2 13.3  LYMPHSABS 1.5  --   --  1.3 1.3  MONOABS 1.0  --   --  1.0 0.9  EOSABS 0.0  --   --  0.1 0.1  BASOSABS 0.0  --   --  0.0 0.0    Electrolytes Recent Labs  Lab 03/23/22 0035 03/23/22 0103 03/23/22 0200 03/23/22 0230 03/23/22 0441 03/23/22 0600 03/23/22 0930 03/24/22 0332 03/24/22 0553  NA 136 135 135 136  --  137  --  140  --  K 3.8 4.0 3.7 4.0  --  3.8  --  3.6  --   CL 97* 99 102  --   --  100  --  106  --   CO2 21*  --  20*  --   --  21*  --  21*  --   GLUCOSE 131* 171* 95  --   --  145*  --  120*  --   BUN 33* 35* 33*  --   --  34*  --  39*  --   CREATININE 4.03* 4.10* 3.78*  --   --  3.69*  --  5.16*  --   CALCIUM 9.3  --  8.6*  --   --  7.9*  --  8.5*  --   AST 404*  --  394*  --   --  259*  --  238*  --   ALT 138*  --  136*  --   --  110*  --  99*  --   ALKPHOS 75  --  72  --   --  57  --  54  --   BILITOT 0.6  --  0.6  --   --  0.4  --  0.5  --   ALBUMIN 3.8  --  3.7  --   --  2.5*  --  2.3*  --   MG  --   --   --   --   --  2.0  --   --  1.8  CRP  --   --   --   --   --   --   --   --  7.1*  LATICACIDVEN  --   --  2.6*  --  1.8  --   --   --   --   INR 1.1   --   --   --   --   --  1.3*  --   --   TSH  --   --   --   --   --   --  1.006  --   --   AMMONIA  --   --   --   --  16  --   --   --   --   BNP  --   --   --   --   --   --   --   --  126.1*    ------------------------------------------------------------------------------------------------------------------ No results for input(s): "CHOL", "HDL", "LDLCALC", "TRIG", "CHOLHDL", "LDLDIRECT" in the last 72 hours.  Lab Results  Component Value Date   HGBA1C 6.0 (H) 01/21/2022    Recent Labs    03/23/22 0930  TSH 1.006   ------------------------------------------------------------------------------------------------------------------ ID Labs Recent Labs  Lab 03/23/22 0035 03/23/22 0103 03/23/22 0200 03/23/22 0441 03/23/22 0600 03/23/22 1820 03/24/22 0332 03/24/22 0553  WBC 12.1*  --   --   --   --  10.8*  --  10.1  PLT 266  --   --   --   --  133*  --  143*  CRP  --   --   --   --   --   --   --  7.1*  LATICACIDVEN  --   --  2.6* 1.8  --   --   --   --   CREATININE 4.03* 4.10* 3.78*  --  3.69*  --  5.16*  --     Radiology Reports  DG Chest Port 1 View  Result Date: 03/24/2022 CLINICAL DATA:  61 year old male with history of shortness of breath. EXAM: PORTABLE CHEST 1 VIEW COMPARISON:  Chest x-ray 03/23/2020. FINDINGS: Lung volumes are normal. No consolidative airspace disease. No pleural effusions. No pneumothorax. No pulmonary nodule or mass noted. Pulmonary vasculature and the cardiomediastinal silhouette are within normal limits. Atherosclerotic calcifications are noted in the thoracic aorta. IMPRESSION: 1.  No radiographic evidence of acute cardiopulmonary disease. 2. Aortic atherosclerosis. Electronically Signed   By: Trudie Reed M.D.   On: 03/24/2022 06:10   CT HEAD WO CONTRAST ( )  Result Date: 03/23/2022 CLINICAL DATA:  Larey Seat and hit head. EXAM: CT HEAD WITHOUT CONTRAST TECHNIQUE: Contiguous axial images were obtained from the base of the skull through the  vertex without intravenous contrast. RADIATION DOSE REDUCTION: This exam was performed according to the departmental dose-optimization program which includes automated exposure control, adjustment of the mA and/or kV according to patient size and/or use of iterative reconstruction technique. COMPARISON:  None Available. FINDINGS: Brain: No evidence of acute infarction, hemorrhage, hydrocephalus, extra-axial collection or mass lesion/mass effect. There are mildly prominent perivascular spaces in the anterior basal ganglia. Mild cerebral cortical atrophy with minimal small vessel disease in the deep white matter. Vascular: There are scattered calcifications in both siphons. No hyperdense central vasculature. Skull: There is no visible scalp hematoma. No fracture or focal skull lesion is seen. Sinuses/Orbits: There are chronic depressed fractures of both orbital floors. The orbital contents are unremarkable. Visible sinuses and mastoid air cells are clear. Other: None. IMPRESSION: 1. No acute intracranial CT findings. 2. No depressed skull fractures. 3. Chronic depressed fractures of both orbital floors. Electronically Signed   By: Almira Bar M.D.   On: 03/23/2022 03:56   DG Elbow Complete Left  Result Date: 03/23/2022 CLINICAL DATA:  Pain after fall EXAM: LEFT ELBOW - COMPLETE 3+ VIEW COMPARISON:  None Available. FINDINGS: There is no evidence of fracture, dislocation, or joint effusion. There is no evidence of arthropathy or other focal bone abnormality. Soft tissues are unremarkable. IMPRESSION: No acute fracture or dislocation. Electronically Signed   By: Minerva Fester M.D.   On: 03/23/2022 02:08   DG Wrist Complete Left  Result Date: 03/23/2022 CLINICAL DATA:  Pain after fall EXAM: LEFT WRIST - COMPLETE 3+ VIEW COMPARISON:  None Available. FINDINGS: There is no evidence of fracture or dislocation. Widening of the scapholunate interval. Degenerative arthritis first CMC joint. Soft tissues are  unremarkable. IMPRESSION: No acute fracture or dislocation. Electronically Signed   By: Minerva Fester M.D.   On: 03/23/2022 02:08   DG Chest Port 1 View  Result Date: 03/23/2022 CLINICAL DATA:  Questionable sepsis. EXAM: PORTABLE CHEST 1 VIEW COMPARISON:  Chest radiograph dated 01/22/2022. FINDINGS: No focal consolidation, pleural effusion, pneumothorax. The cardiac silhouette is within normal limits. No acute osseous pathology. IMPRESSION: No active disease. Electronically Signed   By: Elgie Collard M.D.   On: 03/23/2022 01:05      Signature  Susa Raring M.D on 03/24/2022 at 8:51 AM   -  To page go to [email or web address]

## 2022-03-24 NOTE — Evaluation (Signed)
Physical Therapy Evaluation Patient Details Name: Charles Estes MRN: [id number] DOB: [birth year] Today's Date: 03/24/2022  History of Present Illness  Pt is a 61 y/o male presenting on 10/21 for AMS. Found with rhabdomyolysis and acute metabolic encphalopathy, transaminitis. PMH includes: bipolar disorder, polysubstance abuse (including cocaine abuse), essential HTN, R ORIF ulna fx nonunion.  Clinical Impression  Patient presents with pain in bottom, impaired balance, impaired cognition and impaired mobility s/p above. Pt lives alone and reports being independent for ADLs/IADLs and ambulation at baseline. Has a daughter that lives close but reports she is mad at him. Today, pt requires Min A for gait training due to instability characterized by no reciprocal hip movement, wide BoS, short step lengths and veering towards left. Currently requires Max A for bed mobility with difficulty problem solving and sequencing movement despite max cues. Demonstrates cognitive deficits related to safety, problem solving and attention. Hoping when pain in buttocks improves, mobility will improve as well. Will follow acutely to maximize independence and mobility prior to return home. If pt does not progress, may need to consider HHPT services.      Recommendations for follow up therapy are one component of a multi-disciplinary discharge planning process, led by the attending physician.  Recommendations may be updated based on patient status, additional functional criteria and insurance authorization.  Follow Up Recommendations No PT follow up (may need HHPt pending progress/pain control)      Assistance Recommended at Discharge Intermittent Supervision/Assistance  Patient can return home with the following  A little help with walking and/or transfers;A little help with bathing/dressing/bathroom;Help with stairs or ramp for entrance;Assist for transportation;Assistance with cooking/housework    Equipment  Recommendations None recommended by PT  Recommendations for Other Services       Functional Status Assessment Patient has had a recent decline in their functional status and demonstrates the ability to make significant improvements in function in a reasonable and predictable amount of time.     Precautions / Restrictions Precautions Precautions: Fall Restrictions Weight Bearing Restrictions: No      Mobility  Bed Mobility Overal bed mobility: Needs Assistance Bed Mobility: Supine to Sit     Supine to sit: Max assist, +2 for safety/equipment     General bed mobility comments: pt requires assist for R LE towards EOB, scooting hips and elevating trunk; difficulty sequencing, coordination and motor planning techniques    Transfers Overall transfer level: Needs assistance Equipment used: None   Sit to Stand: Min assist           General transfer comment: Min A to power and steady in standing from EOB with cues for technique, slow to rise walking hands up LEs. Increased effort due to pain in bottom. Transferred to chair post ambulation.    Ambulation/Gait Ambulation/Gait assistance: Min assist Gait Distance (Feet): 150 Feet Assistive device: None Gait Pattern/deviations: Step-through pattern, Wide base of support, Step-to pattern, Decreased step length - right, Decreased step length - left Gait velocity: decreased Gait velocity interpretation: <1.31 ft/sec, indicative of household ambulator   General Gait Details: Slow, waddling like gait with wide BoS and veering towards left of hallway almost running into things. Mildly unsteady. No hip mobility during gait noted.  Stairs            Wheelchair Mobility    Modified Rankin (Stroke Patients Only)       Balance Overall balance assessment: Needs assistance Sitting-balance support: No upper extremity supported, Feet supported Sitting balance-Leahy Scale: Fair Sitting balance -  Comments: limited dynamically    Standing balance support: During functional activity Standing balance-Leahy Scale: Fair Standing balance comment: min guard for safety                             Pertinent Vitals/Pain Pain Assessment Pain Assessment: Faces Faces Pain Scale: Hurts whole lot Pain Location: bottom Pain Descriptors / Indicators: Sore, Discomfort Pain Intervention(s): Monitored during session, Limited activity within patient's tolerance, Repositioned, Patient requesting pain meds-RN notified    Home Living Family/patient expects to be discharged to:: Private residence Living Arrangements: Alone Available Help at Discharge: Family;Available PRN/intermittently Type of Home: House Home Access: Stairs to enter Entrance Stairs-Rails: Right Entrance Stairs-Number of Steps: 4   Home Layout: One level Home Equipment: None      Prior Function Prior Level of Function : Independent/Modified Independent;Driving             Mobility Comments: independent, reitred truck driver, 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 ADLs Comments: independent     Hand Dominance   Dominant Hand: Right    Extremity/Trunk Assessment   Upper Extremity Assessment Upper Extremity Assessment: Defer to OT evaluation LUE Deficits / Details: grossly 0-90* shoulder flexion, discomfort with shoulder elevation but otherwise WFL LUE Sensation: WNL LUE Coordination: decreased gross motor    Lower Extremity Assessment Lower Extremity Assessment: Generalized weakness (but functional)    Cervical / Trunk Assessment Cervical / Trunk Assessment: Normal  Communication   Communication: No difficulties  Cognition Arousal/Alertness: Awake/alert Behavior During Therapy: WFL for tasks assessed/performed Overall Cognitive Status: Impaired/Different from baseline Area of Impairment: Attention, Problem solving, Orientation                   Current Attention Level: Sustained         Problem  Solving: Slow processing, Requires verbal cues, Difficulty sequencing, Requires tactile cues General Comments: pt with increased cueing required to problem solve through locating number on menu, able to call and place order with increased time.  Some cueing required thoughout session for redirection, but likely baseline. diffuclty sequencing and following commands with bed mobility, as well as counting backwards (initally) able to complete on 2nd trial. oriented to month/year but reports its saturday- able to recall sunday at end of session. continue assessment.        General Comments General comments (skin integrity, edema, etc.): Reports it feels like his buttocks is flat like a pancake and in a knot.    Exercises     Assessment/Plan    PT Assessment Patient needs continued PT services  PT Problem List Decreased mobility;Pain;Decreased balance;Decreased activity tolerance;Decreased cognition       PT Treatment Interventions Therapeutic activities;Gait training;Therapeutic exercise;Patient/family education;Balance training;Stair training;Functional mobility training    PT Goals (Current goals can be found in the Care Plan section)  Acute Rehab PT Goals Patient Stated Goal: improve pain in bottom, eat breakfast PT Goal Formulation: With patient Time For Goal Achievement: 04/07/22 Potential to Achieve Goals: Good    Frequency Min 3X/week     Co-evaluation   Reason for Co-Treatment: For patient/therapist safety;To address functional/ADL transfers;Necessary to address cognition/behavior during functional activity   OT goals addressed during session: ADL's and self-care       AM-PAC PT "6 Clicks" Mobility  Outcome Measure Help needed turning from your back to your side while in a flat bed without using bedrails?: A Little Help needed moving from lying  on your back to sitting on the side of a flat bed without using bedrails?: Total Help needed moving to and from a bed to a  chair (including a wheelchair)?: A Little Help needed standing up from a chair using your arms (e.g., wheelchair or bedside chair)?: A Little Help needed to walk in hospital room?: A Little Help needed climbing 3-5 steps with a railing? : A Little 6 Click Score: 16    End of Session   Activity Tolerance: Patient tolerated treatment well;Patient limited by pain Patient left: in chair;with call bell/phone within reach;with chair alarm set Nurse Communication: Mobility status;Patient requests pain meds PT Visit Diagnosis: Pain;Difficulty in walking, not elsewhere classified (R26.2) Pain - part of body:  (buttocks)    Time: 3762-8315 PT Time Calculation (min) (ACUTE ONLY): 28 min   Charges:   PT Evaluation $PT Eval Moderate Complexity: 1 Mod          Marisa Severin, PT, DPT Acute Rehabilitation Services Secure chat preferred Office [phone]     Marguarite Arbour A Casas 03/24/2022, 12:23 PM

## 2022-03-24 NOTE — Evaluation (Signed)
Occupational Therapy Evaluation Patient Details Name: Charles Estes MRN: [id number] DOB: 07/10/1960 Today's Date: 03/24/2022   History of Present Illness Pt is a 61 y/o male presenting on 10/21 for AMS. Found with rhabdomyolysis and acute metabolic encphalopathy, transaminitis. PMH includes: bipolar disorder, polysubstance abuse (including cocaine abuse), essential HTN, R ORIF ulna fx nonunion.   Clinical Impression   PTA patient reports independent and driving. Admitted for above and limited by problem list below, including pain in buttocks, impaired balance, decreased activity tolerance, limited L shoulder ROM, and impaired cognition.  Patient currently requires max assist for bed mobility, min guard for transfers and functional mobility, and setup to max assist for ADLs.  He initially reports Saturday, but able to recall Sunday at end of session; requires cueing for problem solving, safety, and demonstrating decreased attention.  Believe pt will best benefit from further cognitive assessment to ensure safety with driving and med mgmt at dc.  Based on performance, believe pt will benefit from further OT services acutely and after dc at [facility] (pending progress) to optimize independence, safety and return to PLOF. Will follow.      Recommendations for follow up therapy are one component of a multi-disciplinary discharge planning process, led by the attending physician.  Recommendations may be updated based on patient status, additional functional criteria and insurance authorization.   Follow Up Recommendations  Home health OT (pending progress- may need no OT follow up)    Assistance Recommended at Discharge Intermittent Supervision/Assistance  Patient can return home with the following A little help with walking and/or transfers;A lot of help with bathing/dressing/bathroom;Assistance with cooking/housework;Direct supervision/assist for medications management;Direct supervision/assist for financial  management;Assist for transportation;Help with stairs or ramp for entrance    Functional Status Assessment  Patient has had a recent decline in their functional status and demonstrates the ability to make significant improvements in function in a reasonable and predictable amount of time.  Equipment Recommendations  BSC/3in1    Recommendations for Other Services       Precautions / Restrictions Precautions Precautions: Fall Restrictions Weight Bearing Restrictions: No      Mobility Bed Mobility Overal bed mobility: Needs Assistance Bed Mobility: Supine to Sit     Supine to sit: Max assist, +2 for safety/equipment     General bed mobility comments: pt requires assist for R LE towards EOB, scooting hips and elevating trunk; difficulty sequencing, coordination and motor planning techniques    Transfers Overall transfer level: Needs assistance Equipment used: None Transfers: Sit to/from Stand Sit to Stand: Min guard, +2 safety/equipment           General transfer comment: to power up and steady from EOB, increased effort due to pain      Balance Overall balance assessment: Needs assistance Sitting-balance support: No upper extremity supported, Feet supported Sitting balance-Leahy Scale: Fair Sitting balance - Comments: limited dynamically   Standing balance support: No upper extremity supported, During functional activity Standing balance-Leahy Scale: Fair Standing balance comment: min guard for safety                           ADL either performed or assessed with clinical judgement   ADL Overall ADL's : Needs assistance/impaired     Grooming: Min guard;Standing;Wash/dry hands;Wash/dry face           Upper Body Dressing : Set up;Sitting   Lower Body Dressing: Moderate assistance;Sit to/from stand Lower Body Dressing Details (indicate cue type and  reason): requires assist for socks, min guard in standing Toilet Transfer: Min guard    Toileting- Clothing Manipulation and Hygiene: Min guard;Sit to/from stand       Functional mobility during ADLs: Min guard;Cueing for safety       Vision Baseline Vision/History: 0 No visual deficits Ability to See in Adequate Light: 0 Adequate Patient Visual Report: No change from baseline Vision Assessment?: No apparent visual deficits Additional Comments: appears WFL, continue to assess     Perception     Praxis      Pertinent Vitals/Pain Pain Assessment Pain Assessment: Faces Faces Pain Scale: Hurts little more Pain Location: bottom Pain Descriptors / Indicators: Sore Pain Intervention(s): Limited activity within patient's tolerance, Monitored during session, Repositioned, Other (comment) (RN notified)     Hand Dominance Right   Extremity/Trunk Assessment Upper Extremity Assessment Upper Extremity Assessment: LUE deficits/detail LUE Deficits / Details: grossly 0-90* shoulder flexion, discomfort with shoulder elevation but otherwise WFL LUE Sensation: WNL LUE Coordination: decreased gross motor   Lower Extremity Assessment Lower Extremity Assessment: Defer to PT evaluation       Communication Communication Communication: No difficulties   Cognition Arousal/Alertness: Awake/alert Behavior During Therapy: WFL for tasks assessed/performed Overall Cognitive Status: Impaired/Different from baseline Area of Impairment: Attention, Problem solving, Orientation                   Current Attention Level: Sustained         Problem Solving: Slow processing, Requires verbal cues, Difficulty sequencing, Requires tactile cues General Comments: pt with increased cueing required to problem solve through locating number on menu, able to call and place order with increased time.  Some cueing required thoughout session for redirection, but likely baseline. diffuclty sequencing and following commands with bed mobility, as well as counting backwards (initally) able to  complete on 2nd trial. oriented to month/year but reports its saturday- able to recall sunday at end of session. continue assessment     General Comments  RN notified of pain in buttocks    Exercises     Shoulder Instructions      Home Living Family/patient expects to be discharged to:: Private residence Living Arrangements: Alone Available Help at Discharge: Family;Available PRN/intermittently Type of Home: House Home Access: Stairs to enter [employer] of Steps: 4 Entrance Stairs-Rails: Right Home Layout: One level     Bathroom Shower/Tub: Teacher, early years/pre: Standard     Home Equipment: None          Prior Functioning/Environment Prior Level of Function : Independent/Modified Independent;Driving             Mobility Comments: independent, reitred truck driver ADLs Comments: independent        OT Problem List: Decreased strength;Decreased activity tolerance;Impaired balance (sitting and/or standing);Pain;Decreased knowledge of precautions;Decreased knowledge of use of DME or AE;Decreased safety awareness;Decreased cognition;Decreased range of motion;Impaired UE functional use      OT Treatment/Interventions: Self-care/ADL training;Therapeutic exercise;Energy conservation;DME and/or AE instruction;Therapeutic activities;Cognitive remediation/compensation;Patient/family education;Balance training    OT Goals(Current goals can be found in the care plan section) Acute Rehab OT Goals Patient Stated Goal: home OT Goal Formulation: With patient Time For Goal Achievement: 04/07/22 Potential to Achieve Goals: Good ADL Goals Pt Will Perform Grooming: with modified independence;standing Pt Will Perform Lower Body Dressing: with modified independence;sit to/from stand;with adaptive equipment Pt Will Transfer to Toilet: with modified independence;ambulating Pt Will Perform Toileting - Clothing Manipulation and hygiene: with modified  independence;sit to/from stand  Additional ADL Goal #1: Pt will complete 3 step trail making task with no more than supervision.  OT Frequency: Min 2X/week    Co-evaluation PT/OT/SLP Co-Evaluation/Treatment: Yes Reason for Co-Treatment: For patient/therapist safety;To address functional/ADL transfers;Necessary to address cognition/behavior during functional activity   OT goals addressed during session: ADL's and self-care      AM-PAC OT "6 Clicks" Daily Activity     Outcome Measure Help from another person eating meals?: A Little Help from another person taking care of personal grooming?: A Little Help from another person toileting, which includes using toliet, bedpan, or urinal?: A Little Help from another person bathing (including washing, rinsing, drying)?: A Lot Help from another person to put on and taking off regular upper body clothing?: A Little Help from another person to put on and taking off regular lower body clothing?: A Lot 6 Click Score: 16   End of Session Equipment Utilized During Treatment: Gait belt Nurse Communication: Mobility status;Patient requests pain meds  Activity Tolerance: Patient tolerated treatment well Patient left: in chair;with call bell/phone within reach;with chair alarm set  OT Visit Diagnosis: Other abnormalities of gait and mobility (R26.89);Muscle weakness (generalized) (M62.81);Pain;Other symptoms and signs involving cognitive function Pain - part of body:  (bottom)                Time: [id number] OT Time Calculation (min): 29 min Charges:  OT General Charges $OT Visit: 1 Visit OT Evaluation $OT Eval Moderate Complexity: 1 Mod  Jolaine Artist, OT Acute Rehabilitation Services Office [phone]   Delight Stare 03/24/2022, 10:53 AM

## 2022-03-24 NOTE — Progress Notes (Signed)
Bladder scan after 300 output showed 40 mL. Patient denies pain or fullness.

## 2022-03-25 DIAGNOSIS — G9340 Encephalopathy, unspecified: Secondary | ICD-10-CM | POA: Diagnosis not present

## 2022-03-25 LAB — COMPREHENSIVE METABOLIC PANEL
ALT: 100 U/L — ABNORMAL HIGH (ref 0–44)
AST: 269 U/L — ABNORMAL HIGH (ref 15–41)
Albumin: 2.4 g/dL — ABNORMAL LOW (ref 3.5–5.0)
Alkaline Phosphatase: 50 U/L (ref 38–126)
Anion gap: 10 (ref 5–15)
BUN: 38 mg/dL — ABNORMAL HIGH (ref 6–20)
CO2: 23 mmol/L (ref 22–32)
Calcium: 8.5 mg/dL — ABNORMAL LOW (ref 8.9–10.3)
Chloride: 107 mmol/L (ref 98–111)
Creatinine, Ser: 4.76 mg/dL — ABNORMAL HIGH (ref 0.61–1.24)
GFR, Estimated: 13 mL/min — ABNORMAL LOW (ref >60)
Glucose, Bld: 102 mg/dL — ABNORMAL HIGH (ref 70–99)
Potassium: 3.4 mmol/L — ABNORMAL LOW (ref 3.5–5.1)
Sodium: 140 mmol/L (ref 135–145)
Total Bilirubin: 0.9 mg/dL (ref 0.3–1.2)
Total Protein: 4.9 g/dL — ABNORMAL LOW (ref 6.5–8.1)

## 2022-03-25 LAB — CBC WITH DIFFERENTIAL/PLATELET
Abs Immature Granulocytes: 0.04 10*3/uL (ref 0.00–0.07)
Basophils Absolute: 0.1 10*3/uL (ref 0.0–0.1)
Basophils Relative: 1 %
Eosinophils Absolute: 0.1 10*3/uL (ref 0.0–0.5)
Eosinophils Relative: 1 %
HCT: 31.8 % — ABNORMAL LOW (ref 39.0–52.0)
Hemoglobin: 11.2 g/dL — ABNORMAL LOW (ref 13.0–17.0)
Immature Granulocytes: 0 %
Lymphocytes Relative: 19 %
Lymphs Abs: 1.7 10*3/uL (ref 0.7–4.0)
MCH: 32.2 pg (ref 26.0–34.0)
MCHC: 35.2 g/dL (ref 30.0–36.0)
MCV: 91.4 fL (ref 80.0–100.0)
Monocytes Absolute: 0.6 10*3/uL (ref 0.1–1.0)
Monocytes Relative: 6 %
Neutro Abs: 6.6 10*3/uL (ref 1.7–7.7)
Neutrophils Relative %: 73 %
Platelets: 144 10*3/uL — ABNORMAL LOW (ref 150–400)
RBC: 3.48 MIL/uL — ABNORMAL LOW (ref 4.22–5.81)
RDW: 13.3 % (ref 11.5–15.5)
WBC: 9.1 10*3/uL (ref 4.0–10.5)
nRBC: 0 % (ref 0.0–0.2)

## 2022-03-25 LAB — UREA NITROGEN, URINE: Urea Nitrogen, Ur: 121 mg/dL

## 2022-03-25 LAB — BRAIN NATRIURETIC PEPTIDE: B Natriuretic Peptide: 81.4 pg/mL (ref 0.0–100.0)

## 2022-03-25 LAB — MAGNESIUM: Magnesium: 1.6 mg/dL — ABNORMAL LOW (ref 1.7–2.4)

## 2022-03-25 MED ORDER — LACTATED RINGERS IV SOLN: INTRAVENOUS | Status: DC

## 2022-03-25 MED ORDER — POTASSIUM CHLORIDE CRYS ER 20 MEQ PO TBCR
40.0000 meq | EXTENDED_RELEASE_TABLET | Freq: Once | ORAL | Status: AC
  Administered 2022-03-25: 40 meq via ORAL
  Filled 2022-03-25: qty 2

## 2022-03-25 MED ORDER — ALUM & MAG HYDROXIDE-SIMETH 200-200-20 MG/5ML PO SUSP
15.0000 mL | Freq: Four times a day (QID) | ORAL | Status: DC | PRN
  Administered 2022-03-25: 30 mL via ORAL
  Filled 2022-03-25: qty 30

## 2022-03-25 MED ORDER — MAGNESIUM SULFATE 4 GM/100ML IV SOLN
4.0000 g | Freq: Once | INTRAVENOUS | Status: AC
  Administered 2022-03-25: 4 g via INTRAVENOUS
  Filled 2022-03-25: qty 100

## 2022-03-25 NOTE — TOC Initial Note (Signed)
Transition of Care [facility]) - Initial/Assessment Note    Patient Details  Name: Charles Estes MRN: [id number] Date of Birth: January 08, 1961  Transition of Care [facility]) CM/SW Contact:    [name], RN Phone Number: 03/25/2022, 3:58 PM  Clinical Narrative:                 Pt requesting some assistance with housing post hospitalization. Pt says he became homeless last week when his girlfriend took out a 50B. Pt asked if he could go to an ALF. Pt currently doesn't qualify for ALF as he drives self, manages own meds, independent with ADL's outside the hospital setting.  Cm  has provided him a list of Boarding homes to call and see about availability. CM has also (with pt permission) sent his information to [facility] [employer] to see if he can get into a senior apartment.  HH recommended per therapies.  Pt will need assistance with transportation at d/c to get to his car. TOC following.  Expected Discharge Plan: Homeless Shelter Barriers to Discharge: Continued Medical Work up   Patient Goals and CMS Choice   CMS Medicare.gov Compare Post Acute Care list provided to:: Patient Choice offered to / list presented to : Patient  Expected Discharge Plan and Services Expected Discharge Plan: Homeless Shelter   Discharge Planning Services: CM Consult   Living arrangements for the past 2 months: Homeless                                      Prior Living Arrangements/Services Living arrangements for the past 2 months: Homeless Lives with:: Self Patient language and need for interpreter reviewed:: Yes Do you feel safe going back to the place where you live?: Yes          Current home services: DME (cane) Criminal Activity/Legal Involvement Pertinent to Current Situation/Hospitalization: No - Comment as needed  Activities of Daily Living Home Assistive Devices/Equipment: None ADL Screening (condition at time of admission) Patient's cognitive ability adequate to safely complete daily  activities?: Yes Is the patient deaf or have difficulty hearing?: No Does the patient have difficulty seeing, even when wearing glasses/contacts?: No Does the patient have difficulty concentrating, remembering, or making decisions?: No Patient able to express need for assistance with ADLs?: Yes Does the patient have difficulty dressing or bathing?: Yes Independently performs ADLs?: No Does the patient have difficulty walking or climbing stairs?: Yes Weakness of Legs: Both Weakness of Arms/Hands: Both  Permission Sought/Granted                  Emotional Assessment Appearance:: Appears stated age Attitude/Demeanor/Rapport: Engaged Affect (typically observed): Accepting Orientation: : Oriented to Self, Oriented to Place, Oriented to  Time, Oriented to Situation   Psych Involvement: No (comment)  Admission diagnosis:  Cocaine abuse (HCC) [F14.10] Hepatitis [K75.9] Elevated troponin [R79.89] Acute encephalopathy [G93.40] AKI (acute kidney injury) ([place]) [N17.9] Non-traumatic rhabdomyolysis [M62.82] Altered mental status, unspecified altered mental status type [R41.82] Overdose of undetermined intent, initial encounter [T50.904A] Patient Active Problem List   Diagnosis Date Noted   Acute encephalopathy 03/23/2022   AKI (acute kidney injury) ([place]) 03/23/2022   Rhabdomyolysis 03/23/2022   SIRS (systemic inflammatory response syndrome) ([place]) 03/23/2022   High anion gap metabolic acidosis 50/53/9767   Elevated troponin 03/23/2022   Transaminitis 03/23/2022   Cocaine abuse ([place]) 01/19/2022   Alcohol use 01/19/2022   Anxiety state  01/19/2022   Hypertension 01/19/2022   Bipolar disorder, curr episode depressed, severe, w/psychotic features (HCC) 01/18/2022   Olecranon bursitis, right elbow 06/10/2017   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

## 2022-03-25 NOTE — Progress Notes (Signed)
Occupational Therapy Treatment Patient Details Name: Charles Estes MRN: [id number] DOB: 04-17-1961 Today's Date: 03/25/2022   History of present illness Pt is a 61 y/o male presenting on 10/21 for AMS. Found with rhabdomyolysis and acute metabolic encphalopathy, transaminitis. PMH includes: bipolar disorder, polysubstance abuse (including cocaine abuse), essential HTN, R ORIF ulna fx nonunion.   OT comments  Patient in recliner upon entry, agreeable to OT session. Completing toileting and toilet transfer, grooming with supervision but requires min assist for LB dressing.  Short blessed test completed with pt scoring 6/28- questionable impairment but due to deficits in short term memory, attention and sequencing; completed 3 step trail making task with min assist for problem solving, min cueing to recall room number, and total assist to recall final step in sequence.  At this time recommend assist for med mgmt, driving and cooking. Will follow acutely.    Recommendations for follow up therapy are one component of a multi-disciplinary discharge planning process, led by the attending physician.  Recommendations may be updated based on patient status, additional functional criteria and insurance authorization.    Follow Up Recommendations  Home health OT    Assistance Recommended at Discharge Intermittent Supervision/Assistance  Patient can return home with the following  A little help with walking and/or transfers;Assistance with cooking/housework;Direct supervision/assist for medications management;Direct supervision/assist for financial management;Assist for transportation;Help with stairs or ramp for entrance;A little help with bathing/dressing/bathroom   Equipment Recommendations  BSC/3in1    Recommendations for Other Services Speech consult    Precautions / Restrictions Precautions Precautions: Fall Restrictions Weight Bearing Restrictions: No       Mobility Bed Mobility                General bed mobility comments: in recliner upon entry    Transfers Overall transfer level: Needs assistance Equipment used: None Transfers: Sit to/from Stand Sit to Stand: Supervision           General transfer comment: supervision     Balance Overall balance assessment: Needs assistance Sitting-balance support: Feet supported, No upper extremity supported Sitting balance-Leahy Scale: Good     Standing balance support: No upper extremity supported, During functional activity Standing balance-Leahy Scale: Fair Standing balance comment: no losses of balance                           ADL either performed or assessed with clinical judgement   ADL Overall ADL's : Needs assistance/impaired     Grooming: Supervision/safety;Oral care;Wash/dry hands;Standing               Lower Body Dressing: Minimal assistance;Sit to/from stand   Toilet Transfer: Ambulation;Supervision/safety   Toileting- Architect and Hygiene: Supervision/safety;Sit to/from stand       Functional mobility during ADLs: Supervision/safety      Extremity/Trunk Assessment              Vision       Perception     Praxis      Cognition Arousal/Alertness: Awake/alert Behavior During Therapy: WFL for tasks assessed/performed Overall Cognitive Status: Impaired/Different from baseline Area of Impairment: Memory, Problem solving, Attention, Awareness                   Current Attention Level: Sustained Memory: Decreased short-term memory     Awareness: Emergent Problem Solving: Slow processing, Decreased initiation, Difficulty sequencing, Requires verbal cues General Comments: patient scoring 6/28 on short blessed test (questionable impairment). pt complete  3 step trail making task with min assist for probelm sovling and total assist to recall 3rd task to complete.        Exercises      Shoulder Instructions       General Comments Bottom  still hurts but reports it might be slightly better than yesterday, reports he fell off his friend's porch priro to admission onto his bottom.    Pertinent Vitals/ Pain       Pain Assessment Pain Assessment: Faces Faces Pain Scale: Hurts little more Pain Location: bottom Pain Descriptors / Indicators: Sore, Discomfort Pain Intervention(s): Limited activity within patient's tolerance, Monitored during session, Repositioned  Home Living                                          Prior Functioning/Environment              Frequency  Min 2X/week        Progress Toward Goals  OT Goals(current goals can now be found in the care plan section)  Progress towards OT goals: Progressing toward goals  Acute Rehab OT Goals Patient Stated Goal: home OT Goal Formulation: With patient Time For Goal Achievement: 04/07/22 Potential to Achieve Goals: Good  Plan Frequency remains appropriate;Discharge plan remains appropriate    Co-evaluation                 AM-PAC OT "6 Clicks" Daily Activity     Outcome Measure   Help from another person eating meals?: A Little Help from another person taking care of personal grooming?: A Little Help from another person toileting, which includes using toliet, bedpan, or urinal?: A Little Help from another person bathing (including washing, rinsing, drying)?: A Little Help from another person to put on and taking off regular upper body clothing?: A Little Help from another person to put on and taking off regular lower body clothing?: A Little 6 Click Score: 18    End of Session Equipment Utilized During Treatment: Gait belt  OT Visit Diagnosis: Other abnormalities of gait and mobility (R26.89);Muscle weakness (generalized) (M62.81);Pain;Other symptoms and signs involving cognitive function Pain - part of body:  (bottom)   Activity Tolerance Patient tolerated treatment well   Patient Left in chair;with call bell/phone  within reach;with chair alarm set   Nurse Communication Other (comment);Mobility status (requesting heartburn meds)        Time: 0865-7846 OT Time Calculation (min): 25 min  Charges: OT General Charges $OT Visit: 1 Visit OT Treatments $Self Care/Home Management : 23-37 mins [place] [phone]   Delight Stare 03/25/2022, 1:52 PM

## 2022-03-25 NOTE — Progress Notes (Signed)
Mobility Specialist Progress Note   03/25/22 1400  Mobility  Activity Ambulated with assistance in hallway  Level of Assistance Contact guard assist, steadying assist  Assistive Device None  Distance Ambulated (ft) 150 ft  Range of Motion/Exercises Active;All extremities  Activity Response Tolerated well   Patient received in recliner agreeable to participate in mobility. Ambulated min guard with steady gait. Returned to room without complaint or incident. Was left in recliner with all needs met, call bell in reach.   [place] Kyah Buesing, [place], [place]  Office: [phone]

## 2022-03-25 NOTE — Progress Notes (Signed)
Physical Therapy Treatment Patient Details Name: Charles Estes MRN: [id number] DOB: 1960-12-26 Today's Date: 03/25/2022   History of Present Illness Pt is a 61 y/o male presenting on 10/21 for AMS. Found with rhabdomyolysis and acute metabolic encphalopathy, transaminitis. PMH includes: bipolar disorder, polysubstance abuse (including cocaine abuse), essential HTN, R ORIF ulna fx nonunion.    PT Comments    Patient progressing well towards PT goals. Session focused on progressive ambulation, stair training and cognition. Pt does not recall therapy session yesterday and repeating same stories from yesterday. Seems clearer cognitively this date with better ability to problem solve and less assist needed for bed mobility and ambulation. Tolerated stair training with supervision for safety. Pt continues to have pain in buttocks but reports he fell off his friend's porch onto his butt PTA. Pt concerned as he does not have a place to go when he leaves the hospital, inquiring about a boarding house or "disability house." Contacted SW/CM for assist. Likely closer to baseline today. Will follow.   Recommendations for follow up therapy are one component of a multi-disciplinary discharge planning process, led by the attending physician.  Recommendations may be updated based on patient status, additional functional criteria and insurance authorization.  Follow Up Recommendations  No PT follow up     Assistance Recommended at Discharge PRN  Patient can return home with the following A little help with walking and/or transfers;A little help with bathing/dressing/bathroom;Assist for transportation   Equipment Recommendations  None recommended by PT    Recommendations for Other Services       Precautions / Restrictions Precautions Precautions: Fall Restrictions Weight Bearing Restrictions: No     Mobility  Bed Mobility Overal bed mobility: Needs Assistance Bed Mobility: Supine to Sit      Supine to sit: Min guard, HOB elevated     General bed mobility comments: No assist needed, use of rails. able to sit on bottom today.    Transfers Overall transfer level: Needs assistance Equipment used: None Transfers: Sit to/from Stand Sit to Stand: Supervision           General transfer comment: Supervision for safety. Stood from [employer], from toilet x1, transferred to chair post ambualtion.    Ambulation/Gait Ambulation/Gait assistance: Supervision Gait Distance (Feet): 175 Feet Assistive device: None Gait Pattern/deviations: Step-through pattern, Wide base of support, Decreased step length - right, Decreased step length - left Gait velocity: decreased Gait velocity interpretation: 1.31 - 2.62 ft/sec, indicative of limited community ambulator   General Gait Details: Faster gait speed today with continued BoS, not as bad as yesterday. No unsteadiness noted. Decreased hip mobility.   Stairs Stairs: Yes Stairs assistance: Supervision Stair Management: Alternating pattern, Two rails, Forwards Number of Stairs: 5 (x2 bouts) General stair comments: Good, safe technique using Bil rails for support.   Wheelchair Mobility    Modified Rankin (Stroke Patients Only)       Balance Overall balance assessment: Needs assistance Sitting-balance support: Feet supported, No upper extremity supported Sitting balance-Leahy Scale: Good     Standing balance support: During functional activity Standing balance-Leahy Scale: Fair Standing balance comment: Able to stand at sink and perform ADLs wihtout LOB or difficulty, supervision for safety.                            Cognition Arousal/Alertness: Awake/alert Behavior During Therapy: WFL for tasks assessed/performed Overall Cognitive Status: Impaired/Different from baseline Area of Impairment: Memory, Problem solving  General Comments: Pt does not recall therapist coming  into room yesterday and walking in hallway, no recollection. Seems clearer today cognitively. Improved sequencing today and problem solving.        Exercises      General Comments General comments (skin integrity, edema, etc.): Bottom still hurts but reports it might be slightly better than yesterday, reports he fell off his friend's porch priro to admission onto his bottom.      Pertinent Vitals/Pain Pain Assessment Pain Assessment: Faces Faces Pain Scale: Hurts little more Pain Location: bottom, head Pain Descriptors / Indicators: Sore, Discomfort, Headache Pain Intervention(s): Monitored during session, Premedicated before session, Repositioned    Home Living                          Prior Function            PT Goals (current goals can now be found in the care plan section) Progress towards PT goals: Progressing toward goals    Frequency    Min 3X/week      PT Plan Current plan remains appropriate    Co-evaluation              AM-PAC PT "6 Clicks" Mobility   Outcome Measure  Help needed turning from your back to your side while in a flat bed without using bedrails?: A Little Help needed moving from lying on your back to sitting on the side of a flat bed without using bedrails?: A Little Help needed moving to and from a bed to a chair (including a wheelchair)?: A Little Help needed standing up from a chair using your arms (e.g., wheelchair or bedside chair)?: A Little Help needed to walk in hospital room?: A Little Help needed climbing 3-5 steps with a railing? : A Little 6 Click Score: 18    End of Session Equipment Utilized During Treatment: Gait belt Activity Tolerance: Patient tolerated treatment well Patient left: in chair;with call bell/phone within reach Nurse Communication: Mobility status PT Visit Diagnosis: Pain;Difficulty in walking, not elsewhere classified (R26.2) Pain - part of body:  (bottom)     Time: 8003-4917 PT Time  Calculation (min) (ACUTE ONLY): 21 min  Charges:  $Gait Training: 8-22 mins                     Marisa Severin, PT, DPT Acute [facility] Services Secure chat preferred Office [place] 03/25/2022, 10:17 AM

## 2022-03-25 NOTE — Progress Notes (Signed)
PROGRESS NOTE                                                                                                                                                                                                             Patient Demographics:    Charles Estes, is a 61 y.o. male, DOB - 02/11/1961, ZOX:[id number]  Outpatient Primary MD for the patient is El-Khoury, Colan Neptune, MD    LOS - 2  Admit date - 03/23/2022    Chief Complaint  Patient presents with   Altered Mental Status       Brief Narrative (HPI from H&P)   61 year old male with medical history of bipolar disorder, polysubstance abuse, including cocaine abuse, essential hypertension who was admitted to [facility] on 03/23/2022 with acute metabolic encephalopathy in setting of rhabdomyolysis, UDS was positive for cocaine.  He was dropped in the ER by unknown individual in a confused state, further work-up suggested rhabdomyolysis, toxic and metabolic encephalopathy along with acute renal failure.   Subjective:   Patient in bed, appears comfortable, denies any headache, no fever, no chest pain or pressure, no shortness of breath , no abdominal pain. No new focal weakness.   Assessment  & Plan :   Toxic and metabolic encephalopathy caused by severe dehydration, cocaine and polysubstance abuse - last use on the 19th, says he takes cocaine once a month and plans not to do so again, no intentional drug overdose or suicidal ideation.  Currently mentation back to normal, head CT unremarkable.  No headache.  Counseled to quit all polysubstance abuse.  Clearly counseled not to use cocaine with Coreg.  Rhabdomyolysis in conjunction with severe dehydration with hypotension, AKI - continue IV fluids, blood pressure has stabilized, postvoid morning bladder scan is 40 cc, renal function still worsening, renal ultrasound was relatively stable this was discussed with urologist on-call  Dr. Irine Seal on 03/24/2022 followed up with CT renal stone protocol which was unremarkable.  Renal function improving continue gentle hydration.  There was question of chronic pyelonephritis on CT however UA is bland and there is no dysuria or fever will monitor.  Questionable Wellbutrin overdose.  Patient denies.  EKG stable.  Monitor.   Mild non-ACS pattern troponin elevation.  No chest pain likely due to cocaine abuse, chest pain-free, placed on aspirin, beta-blocker and statin.  Will  EKG and echocardiogram with preserved EF and no wall motion abnormality.  Mild asymptomatic transaminitis.  Due to rhabdomyolysis.  Monitor.  Dyslipidemia.  Placed on statin.      Condition - Fair  Family Communication  :  None  Code Status :  Full  Consults  :  None  PUD Prophylaxis :    Procedures  :     CT renal stone protocol.  Nonacute.    Renal ultrasound.  Nonacute, port discussed with Dr. Wilson Singer neurologist.  CT head.  Nonacute.  TTE - 1. Left ventricular ejection fraction, by estimation, is 60 to 65%. The left ventricle has normal function. Left ventricular endocardial border not optimally defined to evaluate regional wall motion. Left ventricular diastolic parameters are consistent with Grade I diastolic dysfunction (impaired relaxation).  2. Right ventricular systolic function is normal. The right ventricular size is normal.  3. The mitral valve is normal in structure. No evidence of mitral valve regurgitation. No evidence of mitral stenosis.  4. The aortic valve is tricuspid. Aortic valve regurgitation is not visualized. No aortic stenosis is present.  5. The inferior vena cava is normal in size with greater than 50% respiratory variability, suggesting right atrial pressure of 3 mmHg.      Disposition Plan  :    Status is: Inpatient  DVT Prophylaxis  :    heparin injection 5,000 Units Start: 03/24/22 0945 SCDs Start: 03/23/22 0551    Lab Results  Component Value Date   PLT  144 (L) 03/25/2022    Diet :  Diet Order             DIET SOFT Room service appropriate? Yes; Fluid consistency: Thin  Diet effective now                    Inpatient Medications  Scheduled Meds:  amLODipine  10 mg Oral Daily   aspirin  81 mg Oral Daily   carvedilol  6.25 mg Oral BID WC   doxycycline  100 mg Oral Q12H   heparin injection (subcutaneous)  5,000 Units Subcutaneous Q8H   hydrALAZINE  50 mg Oral Q8H   tamsulosin  0.4 mg Oral Daily   Continuous Infusions:  lactated ringers 100 mL/hr at 03/24/22 2214   magnesium sulfate bolus IVPB 4 g (03/25/22 0817)   PRN Meds:.acetaminophen, hydrALAZINE, HYDROcodone-acetaminophen, labetalol    Objective:   Vitals:   03/24/22 2112 03/24/22 2310 03/25/22 0338 03/25/22 0736  BP: (!) 141/95 (!) 153/89 (!) 177/90 (!) 152/96  Pulse: 88 90 87 99  Resp: Temp: 98.6 F (37 C) 98.5 F (36.9 C) 98.6 F (37 C) 98.4 F (36.9 C)  TempSrc: Oral Oral Oral Oral  SpO2: 98% 99% 98% 98%  Weight:        Wt Readings from Last 3 Encounters:  03/23/22 89.4 kg  01/18/22 89.4 kg  01/17/22 93 kg     Intake/Output Summary (Last 24 hours) at 03/25/2022 0843 Last data filed at 03/25/2022 0759 Gross per 24 hour  Intake 620 ml  Output 2380 ml  Net -1760 ml     Physical Exam  Awake Alert, No new F.N deficits, Normal affect [place].AT,PERRAL Supple Neck, No JVD,   Symmetrical Chest wall movement, Good air movement bilaterally, CTAB RRR,No Gallops, Rubs or new Murmurs,  +ve B.Sounds, Abd Soft, No tenderness,   No Cyanosis, Clubbing or edema      RN pressure injury documentation: Pressure Injury 03/23/22 Buttocks  Bilateral Stage 1 -  Intact skin with non-blanchable redness of a localized area usually over a bony prominence. (Active)  03/23/22 1640  Location: Buttocks  Location Orientation: Bilateral  Staging: Stage 1 -  Intact skin with non-blanchable redness of a localized area usually over a bony prominence.   Wound Description (Comments):   Present on Admission: Yes      Data Review:    CBC Recent Labs  Lab 03/23/22 0035 03/23/22 0103 03/23/22 0230 03/23/22 1820 03/24/22 0553 03/25/22 0439  WBC 12.1*  --   --  10.8* 10.1 9.1  HGB 15.5 15.6 12.6* 13.4 12.3* 11.2*  HCT 46.2 46.0 37.0* 39.0 35.6* 31.8*  PLT 266  --   --  133* 143* 144*  MCV 93.7  --   --  91.5 91.3 91.4  MCH 31.4  --   --  31.5 31.5 32.2  MCHC 33.5  --   --  34.4 34.6 35.2  RDW 13.4  --   --  13.2 13.3 13.3  LYMPHSABS 1.5  --   --  1.3 1.3 1.7  MONOABS 1.0  --   --  1.0 0.9 0.6  EOSABS 0.0  --   --  0.1 0.1 0.1  BASOSABS 0.0  --   --  0.0 0.0 0.1    Electrolytes Recent Labs  Lab 03/23/22 0035 03/23/22 0103 03/23/22 0200 03/23/22 0230 03/23/22 0441 03/23/22 0600 03/23/22 0930 03/24/22 0332 03/24/22 0553 03/25/22 0439  NA 136 135 135 136  --  137  --  140  --  140  K 3.8 4.0 3.7 4.0  --  3.8  --  3.6  --  3.4*  CL 97* 99 102  --   --  100  --  106  --  107  CO2 21*  --  20*  --   --  21*  --  21*  --  23  GLUCOSE 131* 171* 95  --   --  145*  --  120*  --  102*  BUN 33* 35* 33*  --   --  34*  --  39*  --  38*  CREATININE 4.03* 4.10* 3.78*  --   --  3.69*  --  5.16*  --  4.76*  CALCIUM 9.3  --  8.6*  --   --  7.9*  --  8.5*  --  8.5*  AST 404*  --  394*  --   --  259*  --  238*  --  269*  ALT 138*  --  136*  --   --  110*  --  99*  --  100*  ALKPHOS 75  --  72  --   --  57  --  54  --  50  BILITOT 0.6  --  0.6  --   --  0.4  --  0.5  --  0.9  ALBUMIN 3.8  --  3.7  --   --  2.5*  --  2.3*  --  2.4*  MG  --   --   --   --   --  2.0  --   --  1.8 1.6*  CRP  --   --   --   --   --   --   --   --  7.1*  --   LATICACIDVEN  --   --  2.6*  --  1.8  --   --   --   --   --  INR 1.1  --   --   --   --   --  1.3*  --   --   --   TSH  --   --   --   --   --   --  1.006  --   --   --   AMMONIA  --   --   --   --  16  --   --   --   --   --   BNP  --   --   --   --   --   --   --   --  126.1* 81.4     ------------------------------------------------------------------------------------------------------------------ Recent Labs    03/24/22 0943  CHOL 179  HDL 34*  LDLCALC 116*  TRIG 145  CHOLHDL 5.3    Lab Results  Component Value Date   HGBA1C 6.0 (H) 01/21/2022    Recent Labs    03/23/22 0930  TSH 1.006      Radiology Reports ECHOCARDIOGRAM COMPLETE  Result Date: 03/24/2022    ECHOCARDIOGRAM REPORT   Patient Name:   [name] Date of Exam: 03/24/2022 Medical Rec #:  [id number]   Height:       66.0 in Accession #:    [id number]  Weight:       197.0 lb Date of Birth:  [birth year]   BSA:          1.987 m Patient Age:    61 years    BP:           173/100 mmHg Patient Gender: M           HR:           94 bpm. Exam Location:  Inpatient Procedure: 2D Echo, Color Doppler and Cardiac Doppler Indications:    Elevated Troponins  History:        Patient has no prior history of Echocardiogram examinations.                 Risk Factors:Hypertension.  Sonographer:    Irving Burton Senior RDCS Referring Phys: [id number] Angie Fava  Sonographer Comments: No parasternal window due to lung interference. IMPRESSIONS  1. Left ventricular ejection fraction, by estimation, is 60 to 65%. The left ventricle has normal function. Left ventricular endocardial border not optimally defined to evaluate regional wall motion. Left ventricular diastolic parameters are consistent with Grade I diastolic dysfunction (impaired relaxation).  2. Right ventricular systolic function is normal. The right ventricular size is normal.  3. The mitral valve is normal in structure. No evidence of mitral valve regurgitation. No evidence of mitral stenosis.  4. The aortic valve is tricuspid. Aortic valve regurgitation is not visualized. No aortic stenosis is present.  5. The inferior vena cava is normal in size with greater than 50% respiratory variability, suggesting right atrial pressure of 3 mmHg. FINDINGS  Left Ventricle: Left  ventricular ejection fraction, by estimation, is 60 to 65%. The left ventricle has normal function. Left ventricular endocardial border not optimally defined to evaluate regional wall motion. The left ventricular internal cavity size was normal in size. There is no left ventricular hypertrophy. Left ventricular diastolic parameters are consistent with Grade I diastolic dysfunction (impaired relaxation). Normal left ventricular filling pressure. Right Ventricle: The right ventricular size is normal. Right vetricular wall thickness was not well visualized. Right ventricular systolic function is normal. Left Atrium: Left atrial size was normal in size. Right Atrium: Right atrial size was  normal in size. Pericardium: There is no evidence of pericardial effusion. Mitral Valve: The mitral valve is normal in structure. No evidence of mitral valve regurgitation. No evidence of mitral valve stenosis. Tricuspid Valve: The tricuspid valve is normal in structure. Tricuspid valve regurgitation is not demonstrated. No evidence of tricuspid stenosis. Aortic Valve: The aortic valve is tricuspid. Aortic valve regurgitation is not visualized. No aortic stenosis is present. Aortic valve mean gradient measures 3.3 mmHg. Aortic valve peak gradient measures 8.3 mmHg. Aortic valve area, by VTI measures 2.02 cm. Pulmonic Valve: The pulmonic valve was not well visualized. Pulmonic valve regurgitation is not visualized. No evidence of pulmonic stenosis. Aorta: The aortic root was not well visualized. Venous: The inferior vena cava is normal in size with greater than 50% respiratory variability, suggesting right atrial pressure of 3 mmHg. IAS/Shunts: No atrial level shunt detected by color flow Doppler.  LEFT VENTRICLE PLAX 2D LVOT diam:     2.00 cm   Diastology LV SV:         48        LV e' medial:    5.77 cm/s LV SV Index:   24        LV E/e' medial:  8.4 LVOT Area:     3.14 cm  LV e' lateral:   11.10 cm/s                          LV  E/e' lateral: 4.4  RIGHT VENTRICLE RV S prime:     14.50 cm/s TAPSE (M-mode): 2.3 cm LEFT ATRIUM             Index        RIGHT ATRIUM           Index LA Vol (A2C):   43.8 ml 22.04 ml/m  RA Area:     14.80 cm LA Vol (A4C):   55.1 ml 27.73 ml/m  RA Volume:   33.30 ml  16.76 ml/m LA Biplane Vol: 53.6 ml 26.98 ml/m  AORTIC VALVE AV Area (Vmax):    2.24 cm AV Area (Vmean):   2.91 cm AV Area (VTI):     2.02 cm AV Vmax:           144.38 cm/s AV Vmean:          81.393 cm/s AV VTI:            0.238 m AV Peak Grad:      8.3 mmHg AV Mean Grad:      3.3 mmHg LVOT Vmax:         103.00 cm/s LVOT Vmean:        75.500 cm/s LVOT VTI:          0.153 m LVOT/AV VTI ratio: 0.64 MITRAL VALVE MV Area (PHT): 3.65 cm    SHUNTS MV Decel Time: 208 msec    Systemic VTI:  0.15 m MV E velocity: 48.60 cm/s  Systemic Diam: 2.00 cm MV A velocity: 89.70 cm/s MV E/A ratio:  0.54 Dina RichJonathan Branch MD Electronically signed by Dina RichJonathan Branch MD Signature Date/Time: 03/24/2022/2:55:08 PM    Final    CT RENAL STONE STUDY  Result Date: 03/24/2022 CLINICAL DATA:  Renal stones, hydronephrosis EXAM: CT ABDOMEN AND PELVIS WITHOUT CONTRAST TECHNIQUE: Multidetector CT imaging of the abdomen and pelvis was performed following the standard protocol without IV contrast. RADIATION DOSE REDUCTION: This exam was performed according to the departmental dose-optimization program which includes  automated exposure control, adjustment of the mA and/or kV according to patient size and/or use of iterative reconstruction technique. COMPARISON:  Renal sonogram done on 03/24/2022 FINDINGS: Lower chest: Coronary artery calcifications are seen. Small linear densities are seen in lower lung fields suggesting scarring or subsegmental atelectasis. Hepatobiliary: No focal abnormalities are seen in liver. Gallbladder is not distended. Pancreas: Unremarkable. Spleen: There are small calcified granulomas in spleen. Spleen is not enlarged. Adrenals/Urinary Tract: Adrenals  are unremarkable. There is no hydronephrosis. There are no demonstrable renal or ureteral stones. Mild left hydronephrosis and possible left renal calculus seen in the previous sonogram could not be identified in the CT images. There is perinephric stranding around both kidneys. Urinary bladder is unremarkable. Stomach/Bowel: Small hiatal hernia is seen. Small bowel loops are not dilated. Appendix is unremarkable. There is no significant wall thickening in colon. There is no pericolic stranding. Vascular/Lymphatic: Scattered arterial calcifications are seen. Reproductive: There are few coarse calcifications in prostate. Other: There is no ascites or pneumoperitoneum. Umbilical hernia containing fat is seen. Inguinal hernias containing fat are noted. Musculoskeletal: There is minimal anterolisthesis at L4-L5 level. Degenerative changes are noted in facet joints in lower lumbar spine. IMPRESSION: There is no evidence of intestinal obstruction or pneumoperitoneum. There is no hydronephrosis. Appendix is not dilated. There is perinephric stranding around both kidneys. This may be residual from previous ureteric obstruction or suggest acute or chronic pyelonephritis. There are no demonstrable opaque renal or ureteral stones. Arteriosclerosis.  Coronary artery disease.  Small hiatal hernia. Other findings as described in the body of the report. Electronically Signed   By: Ernie Avena M.D.   On: 03/24/2022 13:34   US RENAL  Result Date: 03/24/2022 CLINICAL DATA:  Acute kidney injury EXAM: RENAL / URINARY TRACT ULTRASOUND COMPLETE COMPARISON:  CT 01/22/2022 FINDINGS: Right Kidney: Renal measurements: 11.4 x 5.3 x 5.4 cm = volume: 172 mL. Echogenicity within normal limits. No mass, shadowing stone, or hydronephrosis visualized. Left Kidney: Renal measurements: 2 11.1 x 7.2 x 5.8 cm = volume: 241 mL. Echogenicity within normal limits. 4 mm stone within the interpolar region of the left kidney. Mild  hydronephrosis. No mass is visualized. Bladder: Appears normal for degree of bladder distention. Other: None. IMPRESSION: 1. Mild left hydronephrosis. 4 mm stone within the interpolar region of the left kidney. 2. Unremarkable right kidney. Electronically Signed   By: Duanne Guess D.O.   On: 03/24/2022 11:21   DG Chest Port 1 View  Result Date: 03/24/2022 CLINICAL DATA:  61 year old male with history of shortness of breath. EXAM: PORTABLE CHEST 1 VIEW COMPARISON:  Chest x-ray 03/23/2020. FINDINGS: Lung volumes are normal. No consolidative airspace disease. No pleural effusions. No pneumothorax. No pulmonary nodule or mass noted. Pulmonary vasculature and the cardiomediastinal silhouette are within normal limits. Atherosclerotic calcifications are noted in the thoracic aorta. IMPRESSION: 1.  No radiographic evidence of acute cardiopulmonary disease. 2. Aortic atherosclerosis. Electronically Signed   By: Trudie Reed M.D.   On: 03/24/2022 06:10   CT HEAD WO CONTRAST ( )  Result Date: 03/23/2022 CLINICAL DATA:  Larey Seat and hit head. EXAM: CT HEAD WITHOUT CONTRAST TECHNIQUE: Contiguous axial images were obtained from the base of the skull through the vertex without intravenous contrast. RADIATION DOSE REDUCTION: This exam was performed according to the departmental dose-optimization program which includes automated exposure control, adjustment of the mA and/or kV according to patient size and/or use of iterative reconstruction technique. COMPARISON:  None Available. FINDINGS: Brain: No evidence of acute infarction,  hemorrhage, hydrocephalus, extra-axial collection or mass lesion/mass effect. There are mildly prominent perivascular spaces in the anterior basal ganglia. Mild cerebral cortical atrophy with minimal small vessel disease in the deep white matter. Vascular: There are scattered calcifications in both siphons. No hyperdense central vasculature. Skull: There is no visible scalp hematoma. No  fracture or focal skull lesion is seen. Sinuses/Orbits: There are chronic depressed fractures of both orbital floors. The orbital contents are unremarkable. Visible sinuses and mastoid air cells are clear. Other: None. IMPRESSION: 1. No acute intracranial CT findings. 2. No depressed skull fractures. 3. Chronic depressed fractures of both orbital floors. Electronically Signed   By: Almira Bar M.D.   On: 03/23/2022 03:56   DG Elbow Complete Left  Result Date: 03/23/2022 CLINICAL DATA:  Pain after fall EXAM: LEFT ELBOW - COMPLETE 3+ VIEW COMPARISON:  None Available. FINDINGS: There is no evidence of fracture, dislocation, or joint effusion. There is no evidence of arthropathy or other focal bone abnormality. Soft tissues are unremarkable. IMPRESSION: No acute fracture or dislocation. Electronically Signed   By: Minerva Fester M.D.   On: 03/23/2022 02:08   DG Wrist Complete Left  Result Date: 03/23/2022 CLINICAL DATA:  Pain after fall EXAM: LEFT WRIST - COMPLETE 3+ VIEW COMPARISON:  None Available. FINDINGS: There is no evidence of fracture or dislocation. Widening of the scapholunate interval. Degenerative arthritis first CMC joint. Soft tissues are unremarkable. IMPRESSION: No acute fracture or dislocation. Electronically Signed   By: Minerva Fester M.D.   On: 03/23/2022 02:08   DG Chest Port 1 View  Result Date: 03/23/2022 CLINICAL DATA:  Questionable sepsis. EXAM: PORTABLE CHEST 1 VIEW COMPARISON:  Chest radiograph dated 01/22/2022. FINDINGS: No focal consolidation, pleural effusion, pneumothorax. The cardiac silhouette is within normal limits. No acute osseous pathology. IMPRESSION: No active disease. Electronically Signed   By: Elgie Collard M.D.   On: 03/23/2022 01:05      Signature  Susa Raring M.D on 03/25/2022 at 8:43 AM   -  To page go to [email or web address]

## 2022-03-26 DIAGNOSIS — G9340 Encephalopathy, unspecified: Secondary | ICD-10-CM | POA: Diagnosis not present

## 2022-03-26 LAB — MAGNESIUM: Magnesium: 2.2 mg/dL (ref 1.7–2.4)

## 2022-03-26 LAB — CBC WITH DIFFERENTIAL/PLATELET
Abs Immature Granulocytes: 0.03 10*3/uL (ref 0.00–0.07)
Basophils Absolute: 0 10*3/uL (ref 0.0–0.1)
Basophils Relative: 1 %
Eosinophils Absolute: 0.1 10*3/uL (ref 0.0–0.5)
Eosinophils Relative: 2 %
HCT: 31.1 % — ABNORMAL LOW (ref 39.0–52.0)
Hemoglobin: 10.8 g/dL — ABNORMAL LOW (ref 13.0–17.0)
Immature Granulocytes: 0 %
Lymphocytes Relative: 22 %
Lymphs Abs: 1.6 10*3/uL (ref 0.7–4.0)
MCH: 31.9 pg (ref 26.0–34.0)
MCHC: 34.7 g/dL (ref 30.0–36.0)
MCV: 91.7 fL (ref 80.0–100.0)
Monocytes Absolute: 0.5 10*3/uL (ref 0.1–1.0)
Monocytes Relative: 7 %
Neutro Abs: 5 10*3/uL (ref 1.7–7.7)
Neutrophils Relative %: 68 %
Platelets: 166 10*3/uL (ref 150–400)
RBC: 3.39 MIL/uL — ABNORMAL LOW (ref 4.22–5.81)
RDW: 13.2 % (ref 11.5–15.5)
WBC: 7.2 10*3/uL (ref 4.0–10.5)
nRBC: 0 % (ref 0.0–0.2)

## 2022-03-26 LAB — COMPREHENSIVE METABOLIC PANEL
ALT: 95 U/L — ABNORMAL HIGH (ref 0–44)
AST: 213 U/L — ABNORMAL HIGH (ref 15–41)
Albumin: 2.5 g/dL — ABNORMAL LOW (ref 3.5–5.0)
Alkaline Phosphatase: 49 U/L (ref 38–126)
Anion gap: 11 (ref 5–15)
BUN: 38 mg/dL — ABNORMAL HIGH (ref 6–20)
CO2: 24 mmol/L (ref 22–32)
Calcium: 8.6 mg/dL — ABNORMAL LOW (ref 8.9–10.3)
Chloride: 106 mmol/L (ref 98–111)
Creatinine, Ser: 3.83 mg/dL — ABNORMAL HIGH (ref 0.61–1.24)
GFR, Estimated: 17 mL/min — ABNORMAL LOW (ref >60)
Glucose, Bld: 106 mg/dL — ABNORMAL HIGH (ref 70–99)
Potassium: 3.9 mmol/L (ref 3.5–5.1)
Sodium: 141 mmol/L (ref 135–145)
Total Bilirubin: 0.8 mg/dL (ref 0.3–1.2)
Total Protein: 5 g/dL — ABNORMAL LOW (ref 6.5–8.1)

## 2022-03-26 LAB — CK: Total CK: 22559 U/L — ABNORMAL HIGH (ref 49–397)

## 2022-03-26 LAB — BRAIN NATRIURETIC PEPTIDE: B Natriuretic Peptide: 190.7 pg/mL — ABNORMAL HIGH (ref 0.0–100.0)

## 2022-03-26 MED ORDER — LACTATED RINGERS IV SOLN: INTRAVENOUS | Status: DC

## 2022-03-26 MED ORDER — ONDANSETRON HCL 4 MG/2ML IJ SOLN
4.0000 mg | Freq: Four times a day (QID) | INTRAMUSCULAR | Status: DC | PRN
  Administered 2022-03-26 – 2022-03-27 (×4): 4 mg via INTRAVENOUS
  Filled 2022-03-26 (×4): qty 2

## 2022-03-26 NOTE — TOC Progression Note (Addendum)
Transition of Care [facility]) - Progression Note    Patient Details  Name: Anton Cheramie MRN: [id number] Date of Birth: [birth year]  Transition of Care [facility]) CM/SW Contact  [name], RN Phone Number: 03/26/2022, 2:18 PM  Clinical Narrative:    CM provided patient this early this am with extended stay motels and shelters in the area and encouraged him to call and see if he can find a place.  Cm checked on the patient at 1415 and he has not made any calls. He states the motels, "are in the hood". He says he is going to call the shelter on [address] but has not. [facility] rep is reaching out to him currently.  TOC unable to find pt a place to stay without his cooperation.   1435: [facility] spoke to pt but he doesn't make enough money to get into senior apartment. He also is very specific about where he wants to be and when she attempted to look more he hung up the phone.    Expected Discharge Plan: Homeless Shelter Barriers to Discharge: Continued Medical Work up  Expected Discharge Plan and Services Expected Discharge Plan: Homeless Shelter   Discharge Planning Services: CM Consult   Living arrangements for the past 2 months: Homeless                                       Social Determinants of Health (SDOH) Interventions    Readmission Risk Interventions     No data to display

## 2022-03-26 NOTE — Evaluation (Signed)
Speech Language Pathology Evaluation Patient Details Name: Charles Estes MRN: [id number] DOB: 1960/10/06 Today's Date: 03/26/2022 Time: 1225-1300 SLP Time Calculation (min) (ACUTE ONLY): 35 min  Problem List:  Patient Active Problem List   Diagnosis Date Noted   Acute encephalopathy 03/23/2022   AKI (acute kidney injury) ([place]) 03/23/2022   Rhabdomyolysis 03/23/2022   SIRS (systemic inflammatory response syndrome) ([place]) 03/23/2022   High anion gap metabolic acidosis 50/93/2671   Elevated troponin 03/23/2022   Transaminitis 03/23/2022   Cocaine abuse ([place]) 01/19/2022   Alcohol use 01/19/2022   Anxiety state 01/19/2022   Hypertension 01/19/2022   Bipolar disorder, curr episode depressed, severe, w/psychotic features ([place]) 01/18/2022   Olecranon bursitis, right elbow 06/10/2017   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   Past Medical History:  Past Medical History:  Diagnosis Date   Depression    GERD (gastroesophageal reflux disease)    Hypertension    Pathologic ulnar fracture with malunion    right   Past Surgical History:  Past Surgical History:  Procedure Laterality Date   MANDIBLE FRACTURE SURGERY  2014   ORIF ULNAR FRACTURE Right 07/16/2017   Procedure: OPEN REDUCTION INTERNAL FIXATION (ORIF) RIGHT ULNA FRACTURE NONUNION;  Surgeon: Leandrew Koyanagi, MD;  Location: [place];  Service: Orthopedics;  Laterality: Right;   HPI:  61 year old male with medical history of bipolar disorder, polysubstance abuse, including cocaine abuse, essential hypertension who was admitted to [facility] on 03/23/2022 with acute metabolic encephalopathy in setting of rhabdomyolysis, UDS was positive for cocaine.  He was dropped in the ER by unknown individual in a confused state, further work-up suggested rhabdomyolysis, toxic and metabolic encephalopathy along with acute renal failure.   Assessment / Plan / Recommendation Clinical Impression  Cognitive-linguistic  evaluation complete. Administered the Cognistat with patient scoring [facility] for all areas with the exception of short term memory/retrieval of new information. Do question patient's decision making ability given no follow through with d/c planning despite information given to him by SW. During the evaluation when asked about d/c planning he stated " I think ill just take my truck to the train tracks and let that take care of the situation." Then proceeded to say that he had life insurance  and him not being here would help his daughter and grandchildren with money, etc. No other suicidal thoughts reported. Encouraged him to begin calling the numbers for housing options provided by SW. Asked whether he wasnted to speak again with SW and didnt answer. Reported this information to RN, MD and Case manager. Given memory defiits, SLP will f/u although wonder if patient is not at baseline.    SLP Assessment  SLP Recommendation/Assessment: Patient needs continued Speech Lanaguage Pathology Services SLP Visit Diagnosis: Frontal lobe and executive function deficit Frontal lobe and executive function deficit following: Unspecified    Recommendations for follow up therapy are one component of a multi-disciplinary discharge planning process, led by the attending physician.  Recommendations may be updated based on patient status, additional functional criteria and insurance authorization.    Follow Up Recommendations   (TBD)          Frequency and Duration min 1 x/week  1 week      SLP Evaluation Cognition  Overall Cognitive Status: Impaired/Different from baseline Arousal/Alertness: Awake/alert Orientation Level: Oriented X4 Memory: Impaired Memory Impairment: Retrieval deficit Awareness: Appears intact Problem Solving: Appears intact Behaviors: Restless Safety/Judgment:  (see impression statement)       Comprehension  Auditory Comprehension  Overall Auditory Comprehension: Appears within functional  limits for tasks assessed Visual Recognition/Discrimination Discrimination: Within Function Limits Reading Comprehension Reading Status: Within funtional limits    Expression Expression Primary Mode of Expression: Verbal Verbal Expression Overall Verbal Expression: Appears within functional limits for tasks assessed   Oral / Motor  Motor Speech Overall Motor Speech: Appears within functional limits for tasks assessed           Ferdinand Lango MA, CCC-SLP  Dezmin Kittelson Meryl 03/26/2022, 1:19 PM

## 2022-03-26 NOTE — Progress Notes (Addendum)
PROGRESS NOTE                                                                                                                                                                                                             Patient Demographics:    Charles Estes, is a 61 y.o. male, DOB - April 07, 1961, FXJ:[id number]  Outpatient Primary MD for the patient is El-Khoury, Irene Shipper, MD    LOS - 3  Admit date - 03/23/2022    Chief Complaint  Patient presents with   Altered Mental Status       Brief Narrative (HPI from H&P)   61 year old male with medical history of bipolar disorder, polysubstance abuse, including cocaine abuse, essential hypertension who was admitted to [facility] on 03/23/2022 with acute metabolic encephalopathy in setting of rhabdomyolysis, UDS was positive for cocaine.  He was dropped in the ER by unknown individual in a confused state, further work-up suggested rhabdomyolysis, toxic and metabolic encephalopathy along with acute renal failure.   Subjective:   Patient in bed, appears comfortable, denies any headache, no fever, no chest pain or pressure, no shortness of breath , no abdominal pain. No new focal weakness.   Assessment  & Plan :   Toxic and metabolic encephalopathy caused by severe dehydration, cocaine and polysubstance abuse - last use on the 19th, says he takes cocaine once a month and plans not to do so again, no intentional drug overdose or suicidal ideation.  Currently mentation back to normal, head CT unremarkable.  No headache.  Counseled to quit all polysubstance abuse.  Clearly counseled not to use cocaine with [place].  Rhabdomyolysis in conjunction with severe dehydration with hypotension, AKI - continue IV fluids, renal function has now shown improvement serially, blood pressure has stabilized, postvoid morning bladder scan is 40 cc, renal function still worsening, renal ultrasound was  relatively stable this was discussed with urologist on-call Dr. Bjorn Pippin on 03/24/2022 followed up with CT renal stone protocol which was unremarkable.  Renal function improving continue gentle hydration.  There was question of chronic pyelonephritis on CT however UA is bland and there is no dysuria or fever will monitor.  Questionable Wellbutrin overdose.  Patient denies.  EKG stable.  Monitor.   Mild non-ACS pattern troponin elevation.  No chest pain likely due to cocaine abuse, chest pain-free, placed  on aspirin, beta-blocker and statin.  Stable EKG and echocardiogram with preserved EF and no wall motion abnormality.  Mild asymptomatic transaminitis.  Due to rhabdomyolysis.  Monitor.  Dyslipidemia.  Placed on statin.   Suicidal ideation expressed to the staff when told he will be DC'd tomorrow, will call Psych.        Condition - Fair  Family Communication  :  None  Code Status :  Full  Consults  :  None  PUD Prophylaxis :    Procedures  :     CT renal stone protocol.  Nonacute.    Renal ultrasound.  Nonacute, port discussed with Dr. Wilson Singer neurologist.  CT head.  Nonacute.  TTE - 1. Left ventricular ejection fraction, by estimation, is 60 to 65%. The left ventricle has normal function. Left ventricular endocardial border not optimally defined to evaluate regional wall motion. Left ventricular diastolic parameters are consistent with Grade I diastolic dysfunction (impaired relaxation).  2. Right ventricular systolic function is normal. The right ventricular size is normal.  3. The mitral valve is normal in structure. No evidence of mitral valve regurgitation. No evidence of mitral stenosis.  4. The aortic valve is tricuspid. Aortic valve regurgitation is not visualized. No aortic stenosis is present.  5. The inferior vena cava is normal in size with greater than 50% respiratory variability, suggesting right atrial pressure of 3 mmHg.      Disposition Plan  :    Status is:  Inpatient  DVT Prophylaxis  :    heparin injection 5,000 Units Start: 03/24/22 0945 SCDs Start: 03/23/22 0551    Lab Results  Component Value Date   PLT 166 03/26/2022    Diet :  Diet Order             Diet Heart Room service appropriate? Yes; Fluid consistency: Thin  Diet effective now                    Inpatient Medications  Scheduled Meds:  amLODipine  10 mg Oral Daily   aspirin  81 mg Oral Daily   carvedilol  6.25 mg Oral BID WC   doxycycline  100 mg Oral Q12H   heparin injection (subcutaneous)  5,000 Units Subcutaneous Q8H   hydrALAZINE  50 mg Oral Q8H   tamsulosin  0.4 mg Oral Daily   Continuous Infusions:  lactated ringers     PRN Meds:.acetaminophen, alum & mag hydroxide-simeth, hydrALAZINE, HYDROcodone-acetaminophen, labetalol, ondansetron (ZOFRAN) IV    Objective:   Vitals:   03/25/22 1950 03/26/22 0015 03/26/22 0418 03/26/22 0738  BP: 119/77 127/89 (!) 143/93 (!) 132/96  Pulse: 83 78 71 85  Resp:    16  Temp: 98.7 F (37.1 C) 98.6 F (37 C) 98.3 F (36.8 C) 98 F (36.7 C)  TempSrc: Oral Oral Oral Oral  SpO2: 97% 100% 99% 94%  Weight:        Wt Readings from Last 3 Encounters:  03/23/22 89.4 kg  01/18/22 89.4 kg  01/17/22 93 kg     Intake/Output Summary (Last 24 hours) at 03/26/2022 0915 Last data filed at 03/26/2022 0630 Gross per 24 hour  Intake --  Output 1400 ml  Net -1400 ml     Physical Exam  Awake Alert, No new F.N deficits, Normal affect Drew.AT,PERRAL Supple Neck, No JVD,   Symmetrical Chest wall movement, Good air movement bilaterally, CTAB RRR,No Gallops, Rubs or new Murmurs,  +ve B.Sounds, Abd Soft, No tenderness,   No  Cyanosis, Clubbing or edema       RN pressure injury documentation: Pressure Injury 03/23/22 Buttocks Bilateral Stage 1 -  Intact skin with non-blanchable redness of a localized area usually over a bony prominence. (Active)  03/23/22 1640  Location: Buttocks  Location Orientation:  Bilateral  Staging: Stage 1 -  Intact skin with non-blanchable redness of a localized area usually over a bony prominence.  Wound Description (Comments):   Present on Admission: Yes      Data Review:    CBC Recent Labs  Lab 03/23/22 0035 03/23/22 0103 03/23/22 0230 03/23/22 1820 03/24/22 0553 03/25/22 0439 03/26/22 0623  WBC 12.1*  --   --  10.8* 10.1 9.1 7.2  HGB 15.5   < > 12.6* 13.4 12.3* 11.2* 10.8*  HCT 46.2   < > 37.0* 39.0 35.6* 31.8* 31.1*  PLT 266  --   --  133* 143* 144* 166  MCV 93.7  --   --  91.5 91.3 91.4 91.7  MCH 31.4  --   --  31.5 31.5 32.2 31.9  MCHC 33.5  --   --  34.4 34.6 35.2 34.7  RDW 13.4  --   --  13.2 13.3 13.3 13.2  LYMPHSABS 1.5  --   --  1.3 1.3 1.7 1.6  MONOABS 1.0  --   --  1.0 0.9 0.6 0.5  EOSABS 0.0  --   --  0.1 0.1 0.1 0.1  BASOSABS 0.0  --   --  0.0 0.0 0.1 0.0   < > = values in this interval not displayed.    Electrolytes Recent Labs  Lab 03/23/22 0035 03/23/22 0103 03/23/22 0200 03/23/22 0230 03/23/22 0441 03/23/22 0600 03/23/22 0930 03/24/22 0332 03/24/22 0553 03/25/22 0439 03/26/22 0623  NA 136   < > 135 136  --  137  --  140  --  140 141  K 3.8   < > 3.7 4.0  --  3.8  --  3.6  --  3.4* 3.9  CL 97*   < > 102  --   --  100  --  106  --  107 106  CO2 21*  --  20*  --   --  21*  --  21*  --  23 24  GLUCOSE 131*   < > 95  --   --  145*  --  120*  --  102* 106*  BUN 33*   < > 33*  --   --  34*  --  39*  --  38* 38*  CREATININE 4.03*   < > 3.78*  --   --  3.69*  --  5.16*  --  4.76* 3.83*  CALCIUM 9.3  --  8.6*  --   --  7.9*  --  8.5*  --  8.5* 8.6*  AST 404*  --  394*  --   --  259*  --  238*  --  269* 213*  ALT 138*  --  136*  --   --  110*  --  99*  --  100* 95*  ALKPHOS 75  --  72  --   --  57  --  54  --  50 49  BILITOT 0.6  --  0.6  --   --  0.4  --  0.5  --  0.9 0.8  ALBUMIN 3.8  --  3.7  --   --  2.5*  --  2.3*  --  2.4* 2.5*  MG  --   --   --   --   --  2.0  --   --  1.8 1.6* 2.2  CRP  --   --   --   --   --    --   --   --  7.1*  --   --   LATICACIDVEN  --   --  2.6*  --  1.8  --   --   --   --   --   --   INR 1.1  --   --   --   --   --  1.3*  --   --   --   --   TSH  --   --   --   --   --   --  1.006  --   --   --   --   AMMONIA  --   --   --   --  16  --   --   --   --   --   --   BNP  --   --   --   --   --   --   --   --  126.1* 81.4 190.7*   < > = values in this interval not displayed.    ------------------------------------------------------------------------------------------------------------------ Recent Labs    03/24/22 0943  CHOL 179  HDL 34*  LDLCALC 116*  TRIG 145  CHOLHDL 5.3    Lab Results  Component Value Date   HGBA1C 6.0 (H) 01/21/2022    Recent Labs    03/23/22 0930  TSH 1.006      Radiology Reports ECHOCARDIOGRAM COMPLETE  Result Date: 03/24/2022    ECHOCARDIOGRAM REPORT   Patient Name:   [name] Date of Exam: 03/24/2022 Medical Rec #:  [id number]   Height:       66.0 in Accession #:    [id number]  Weight:       197.0 lb Date of Birth:  August 26, 1960   BSA:          1.987 m Patient Age:    61 years    BP:           173/100 mmHg Patient Gender: M           HR:           94 bpm. Exam Location:  Inpatient Procedure: 2D Echo, Color Doppler and Cardiac Doppler Indications:    Elevated Troponins  History:        Patient has no prior history of Echocardiogram examinations.                 Risk Factors:Hypertension.  Sonographer:    Irving Burton Senior RDCS Referring Phys: [id number] Angie Fava  Sonographer Comments: No parasternal window due to lung interference. IMPRESSIONS  1. Left ventricular ejection fraction, by estimation, is 60 to 65%. The left ventricle has normal function. Left ventricular endocardial border not optimally defined to evaluate regional wall motion. Left ventricular diastolic parameters are consistent with Grade I diastolic dysfunction (impaired relaxation).  2. Right ventricular systolic function is normal. The right ventricular size is normal.  3.  The mitral valve is normal in structure. No evidence of mitral valve regurgitation. No evidence of mitral stenosis.  4. The aortic valve is tricuspid. Aortic valve regurgitation is not visualized. No aortic stenosis is present.  5. The inferior vena cava is normal in size with  greater than 50% respiratory variability, suggesting right atrial pressure of 3 mmHg. FINDINGS  Left Ventricle: Left ventricular ejection fraction, by estimation, is 60 to 65%. The left ventricle has normal function. Left ventricular endocardial border not optimally defined to evaluate regional wall motion. The left ventricular internal cavity size was normal in size. There is no left ventricular hypertrophy. Left ventricular diastolic parameters are consistent with Grade I diastolic dysfunction (impaired relaxation). Normal left ventricular filling pressure. Right Ventricle: The right ventricular size is normal. Right vetricular wall thickness was not well visualized. Right ventricular systolic function is normal. Left Atrium: Left atrial size was normal in size. Right Atrium: Right atrial size was normal in size. Pericardium: There is no evidence of pericardial effusion. Mitral Valve: The mitral valve is normal in structure. No evidence of mitral valve regurgitation. No evidence of mitral valve stenosis. Tricuspid Valve: The tricuspid valve is normal in structure. Tricuspid valve regurgitation is not demonstrated. No evidence of tricuspid stenosis. Aortic Valve: The aortic valve is tricuspid. Aortic valve regurgitation is not visualized. No aortic stenosis is present. Aortic valve mean gradient measures 3.3 mmHg. Aortic valve peak gradient measures 8.3 mmHg. Aortic valve area, by VTI measures 2.02 cm. Pulmonic Valve: The pulmonic valve was not well visualized. Pulmonic valve regurgitation is not visualized. No evidence of pulmonic stenosis. Aorta: The aortic root was not well visualized. Venous: The inferior vena cava is normal in size with  greater than 50% respiratory variability, suggesting right atrial pressure of 3 mmHg. IAS/Shunts: No atrial level shunt detected by color flow Doppler.  LEFT VENTRICLE PLAX 2D LVOT diam:     2.00 cm   Diastology LV SV:         48        LV e' medial:    5.77 cm/s LV SV Index:   24        LV E/e' medial:  8.4 LVOT Area:     3.14 cm  LV e' lateral:   11.10 cm/s                          LV E/e' lateral: 4.4  RIGHT VENTRICLE RV S prime:     14.50 cm/s TAPSE (M-mode): 2.3 cm LEFT ATRIUM             Index        RIGHT ATRIUM           Index LA Vol (A2C):   43.8 ml 22.04 ml/m  RA Area:     14.80 cm LA Vol (A4C):   55.1 ml 27.73 ml/m  RA Volume:   33.30 ml  16.76 ml/m LA Biplane Vol: 53.6 ml 26.98 ml/m  AORTIC VALVE AV Area (Vmax):    2.24 cm AV Area (Vmean):   2.91 cm AV Area (VTI):     2.02 cm AV Vmax:           144.38 cm/s AV Vmean:          81.393 cm/s AV VTI:            0.238 m AV Peak Grad:      8.3 mmHg AV Mean Grad:      3.3 mmHg LVOT Vmax:         103.00 cm/s LVOT Vmean:        75.500 cm/s LVOT VTI:          0.153 m LVOT/AV VTI ratio: 0.64 MITRAL VALVE MV  Area (PHT): 3.65 cm    SHUNTS MV Decel Time: 208 msec    Systemic VTI:  0.15 m MV E velocity: 48.60 cm/s  Systemic Diam: 2.00 cm MV A velocity: 89.70 cm/s MV E/A ratio:  0.54 Carlyle Dolly MD Electronically signed by Carlyle Dolly MD Signature Date/Time: 03/24/2022/2:55:08 PM    Final    CT RENAL STONE STUDY  Result Date: 03/24/2022 CLINICAL DATA:  Renal stones, hydronephrosis EXAM: CT ABDOMEN AND PELVIS WITHOUT CONTRAST TECHNIQUE: Multidetector CT imaging of the abdomen and pelvis was performed following the standard protocol without IV contrast. RADIATION DOSE REDUCTION: This exam was performed according to the departmental dose-optimization program which includes automated exposure control, adjustment of the mA and/or kV according to patient size and/or use of iterative reconstruction technique. COMPARISON:  Renal sonogram done on 03/24/2022  FINDINGS: Lower chest: Coronary artery calcifications are seen. Small linear densities are seen in lower lung fields suggesting scarring or subsegmental atelectasis. Hepatobiliary: No focal abnormalities are seen in liver. Gallbladder is not distended. Pancreas: Unremarkable. Spleen: There are small calcified granulomas in spleen. Spleen is not enlarged. Adrenals/Urinary Tract: Adrenals are unremarkable. There is no hydronephrosis. There are no demonstrable renal or ureteral stones. Mild left hydronephrosis and possible left renal calculus seen in the previous sonogram could not be identified in the CT images. There is perinephric stranding around both kidneys. Urinary bladder is unremarkable. Stomach/Bowel: Small hiatal hernia is seen. Small bowel loops are not dilated. Appendix is unremarkable. There is no significant wall thickening in colon. There is no pericolic stranding. Vascular/Lymphatic: Scattered arterial calcifications are seen. Reproductive: There are few coarse calcifications in prostate. Other: There is no ascites or pneumoperitoneum. Umbilical hernia containing fat is seen. Inguinal hernias containing fat are noted. Musculoskeletal: There is minimal anterolisthesis at L4-L5 level. Degenerative changes are noted in facet joints in lower lumbar spine. IMPRESSION: There is no evidence of intestinal obstruction or pneumoperitoneum. There is no hydronephrosis. Appendix is not dilated. There is perinephric stranding around both kidneys. This may be residual from previous ureteric obstruction or suggest acute or chronic pyelonephritis. There are no demonstrable opaque renal or ureteral stones. Arteriosclerosis.  Coronary artery disease.  Small hiatal hernia. Other findings as described in the body of the report. Electronically Signed   By: Elmer Picker M.D.   On: 03/24/2022 13:34   US RENAL  Result Date: 03/24/2022 CLINICAL DATA:  Acute kidney injury EXAM: RENAL / URINARY TRACT ULTRASOUND  COMPLETE COMPARISON:  CT 01/22/2022 FINDINGS: Right Kidney: Renal measurements: 11.4 x 5.3 x 5.4 cm = volume: 172 mL. Echogenicity within normal limits. No mass, shadowing stone, or hydronephrosis visualized. Left Kidney: Renal measurements: 2 11.1 x 7.2 x 5.8 cm = volume: 241 mL. Echogenicity within normal limits. 4 mm stone within the interpolar region of the left kidney. Mild hydronephrosis. No mass is visualized. Bladder: Appears normal for degree of bladder distention. Other: None. IMPRESSION: 1. Mild left hydronephrosis. 4 mm stone within the interpolar region of the left kidney. 2. Unremarkable right kidney. Electronically Signed   By: Davina Poke D.O.   On: 03/24/2022 11:21   DG Chest Port 1 View  Result Date: 03/24/2022 CLINICAL DATA:  61 year old male with history of shortness of breath. EXAM: PORTABLE CHEST 1 VIEW COMPARISON:  Chest x-ray 03/23/2020. FINDINGS: Lung volumes are normal. No consolidative airspace disease. No pleural effusions. No pneumothorax. No pulmonary nodule or mass noted. Pulmonary vasculature and the cardiomediastinal silhouette are within normal limits. Atherosclerotic calcifications are noted in the  thoracic aorta. IMPRESSION: 1.  No radiographic evidence of acute cardiopulmonary disease. 2. Aortic atherosclerosis. Electronically Signed   By: Trudie Reed M.D.   On: 03/24/2022 06:10   CT HEAD WO CONTRAST ( )  Result Date: 03/23/2022 CLINICAL DATA:  Larey Seat and hit head. EXAM: CT HEAD WITHOUT CONTRAST TECHNIQUE: Contiguous axial images were obtained from the base of the skull through the vertex without intravenous contrast. RADIATION DOSE REDUCTION: This exam was performed according to the departmental dose-optimization program which includes automated exposure control, adjustment of the mA and/or kV according to patient size and/or use of iterative reconstruction technique. COMPARISON:  None Available. FINDINGS: Brain: No evidence of acute infarction, hemorrhage,  hydrocephalus, extra-axial collection or mass lesion/mass effect. There are mildly prominent perivascular spaces in the anterior basal ganglia. Mild cerebral cortical atrophy with minimal small vessel disease in the deep white matter. Vascular: There are scattered calcifications in both siphons. No hyperdense central vasculature. Skull: There is no visible scalp hematoma. No fracture or focal skull lesion is seen. Sinuses/Orbits: There are chronic depressed fractures of both orbital floors. The orbital contents are unremarkable. Visible sinuses and mastoid air cells are clear. Other: None. IMPRESSION: 1. No acute intracranial CT findings. 2. No depressed skull fractures. 3. Chronic depressed fractures of both orbital floors. Electronically Signed   By: Almira Bar M.D.   On: 03/23/2022 03:56   DG Elbow Complete Left  Result Date: 03/23/2022 CLINICAL DATA:  Pain after fall EXAM: LEFT ELBOW - COMPLETE 3+ VIEW COMPARISON:  None Available. FINDINGS: There is no evidence of fracture, dislocation, or joint effusion. There is no evidence of arthropathy or other focal bone abnormality. Soft tissues are unremarkable. IMPRESSION: No acute fracture or dislocation. Electronically Signed   By: Minerva Fester M.D.   On: 03/23/2022 02:08   DG Wrist Complete Left  Result Date: 03/23/2022 CLINICAL DATA:  Pain after fall EXAM: LEFT WRIST - COMPLETE 3+ VIEW COMPARISON:  None Available. FINDINGS: There is no evidence of fracture or dislocation. Widening of the scapholunate interval. Degenerative arthritis first CMC joint. Soft tissues are unremarkable. IMPRESSION: No acute fracture or dislocation. Electronically Signed   By: Minerva Fester M.D.   On: 03/23/2022 02:08   DG Chest Port 1 View  Result Date: 03/23/2022 CLINICAL DATA:  Questionable sepsis. EXAM: PORTABLE CHEST 1 VIEW COMPARISON:  Chest radiograph dated 01/22/2022. FINDINGS: No focal consolidation, pleural effusion, pneumothorax. The cardiac silhouette is  within normal limits. No acute osseous pathology. IMPRESSION: No active disease. Electronically Signed   By: Elgie Collard M.D.   On: 03/23/2022 01:05      Signature  Susa Raring M.D on 03/26/2022 at 9:15 AM   -  To page go to [email or web address]

## 2022-03-26 NOTE — Care Management Important Message (Signed)
Important Message  Patient Details  Name: Charles Estes MRN: [id number] Date of Birth: 1961/04/03   Medicare Important Message Given:  Yes     Crystal Ellwood Montine Circle 03/26/2022, 3:29 PM

## 2022-03-26 NOTE — Progress Notes (Signed)
Overnight progress note  Notified by RN that patient is refusing IV fluids and also refusing subcutaneous heparin for DVT prophylaxis.  I spoke to the patient and he now agrees to allow RN to administer IV fluids and subcutaneous heparin.

## 2022-03-26 NOTE — Progress Notes (Signed)
Physical Therapy Treatment Patient Details Name: Charles Estes MRN: [id number] DOB: 07/17/1960 Today's Date: 03/26/2022   History of Present Illness Pt is a 61 y/o male presenting on 10/21 for AMS. Found with rhabdomyolysis and acute metabolic encphalopathy, transaminitis. PMH includes: bipolar disorder, polysubstance abuse (including cocaine abuse), essential HTN, R ORIF ulna fx nonunion.    PT Comments    Patient reports not feeling great today. Tolerated in room ambulation and transfers with supervision for safety however reports worsening headache, nausea and "wooziness" after being given nausea meds in middle of session limiting cognitive tasks/ambulation in hallway. Pt requesting to return to supine. RN made aware. Still has no where to go at d/c. Encouraged to call boarding houses but reports no one answered. Will continue to follow and further cognitive assessment warranted.     Recommendations for follow up therapy are one component of a multi-disciplinary discharge planning process, led by the attending physician.  Recommendations may be updated based on patient status, additional functional criteria and insurance authorization.  Follow Up Recommendations  No PT follow up     Assistance Recommended at Discharge PRN  Patient can return home with the following A little help with walking and/or transfers;A little help with bathing/dressing/bathroom;Assist for transportation   Equipment Recommendations  None recommended by PT    Recommendations for Other Services       Precautions / Restrictions Precautions Precautions: Fall Restrictions Weight Bearing Restrictions: No     Mobility  Bed Mobility Overal bed mobility: Needs Assistance Bed Mobility: Supine to Sit, Sit to Supine     Supine to sit: Supervision Sit to supine: Supervision   General bed mobility comments: Performed x3 throughout session, no assist needed, no use of rails. Pt complaining of headache and  worsening nausea/pain in head/stomach after getting nausea meds in middle of session resulting in need to return to supine.    Transfers Overall transfer level: Needs assistance Equipment used: None Transfers: Sit to/from Stand Sit to Stand: Supervision           General transfer comment: Supervision for safety. Stood from [employer], from toilet x1.    Ambulation/Gait Ambulation/Gait assistance: Supervision Gait Distance (Feet): 30 Feet Assistive device: None Gait Pattern/deviations: Step-through pattern, Wide base of support, Decreased step length - right, Decreased step length - left Gait velocity: decreased     General Gait Details: Slow steady gait walking to bathroom/back and around room, hallway ambulation deferred as pt reporting wanting to eat breakfast as well as worsening headache/nausea with mobility. RN aware.   Stairs             Wheelchair Mobility    Modified Rankin (Stroke Patients Only)       Balance Overall balance assessment: Needs assistance Sitting-balance support: Feet supported, No upper extremity supported Sitting balance-Leahy Scale: Good     Standing balance support: During functional activity Standing balance-Leahy Scale: Good Standing balance comment: Able to stand at sink and wash hands without difficulty reaching outside BoS. Able to reach down towards the floor and pick up butter without LOB.                            Cognition Arousal/Alertness: Awake/alert Behavior During Therapy: WFL for tasks assessed/performed Overall Cognitive Status: Impaired/Different from baseline Area of Impairment: Problem solving, Attention, Awareness                   Current Attention Level: Sustained  Memory: Decreased short-term memory     Awareness: Emergent Problem Solving: Slow processing, Decreased initiation, Difficulty sequencing, Requires verbal cues General Comments: Needs further cognitive assessment. ABle to  recall this therapist from yesterday and what we did during session which is an improvement from prior. pt with nausea and woozy feeling in head, per report so wanting to lay down but then trying to eat flat in bed.        Exercises      General Comments        Pertinent Vitals/Pain Pain Assessment Pain Assessment: Faces Faces Pain Scale: Hurts whole lot Pain Location: head Pain Descriptors / Indicators: Headache Pain Intervention(s): Monitored during session, RN gave pain meds during session, Limited activity within patient's tolerance, Premedicated before session, Repositioned    Home Living                          Prior Function            PT Goals (current goals can now be found in the care plan section) Progress towards PT goals: Not progressing toward goals - comment (due to headache, nausea, "woozinesS")    Frequency    Min 3X/week      PT Plan Current plan remains appropriate    Co-evaluation              AM-PAC PT "6 Clicks" Mobility   Outcome Measure  Help needed turning from your back to your side while in a flat bed without using bedrails?: A Little Help needed moving from lying on your back to sitting on the side of a flat bed without using bedrails?: A Little Help needed moving to and from a bed to a chair (including a wheelchair)?: A Little Help needed standing up from a chair using your arms (e.g., wheelchair or bedside chair)?: A Little Help needed to walk in hospital room?: A Little Help needed climbing 3-5 steps with a railing? : A Little 6 Click Score: 18    End of Session Equipment Utilized During Treatment: Gait belt Activity Tolerance: Treatment limited secondary to medical complications (Comment) (headache, nausea, "woozinesS") Patient left: in bed;with call bell/phone within reach;with bed alarm set Nurse Communication: Mobility status PT Visit Diagnosis: Pain;Difficulty in walking, not elsewhere classified  (R26.2) Pain - part of body:  (head)     Time: 6945-0388 PT Time Calculation (min) (ACUTE ONLY): 20 min  Charges:  $Therapeutic Activity: 8-22 mins                     Vale Haven, PT, DPT Acute Rehabilitation Services Secure chat preferred Office [phone]      Blake Divine A Syriana Croslin 03/26/2022, 12:21 PM

## 2022-03-27 ENCOUNTER — Inpatient Hospital Stay (HOSPITAL_COMMUNITY): Payer: Medicare Other

## 2022-03-27 LAB — COMPREHENSIVE METABOLIC PANEL
ALT: 134 U/L — ABNORMAL HIGH (ref 0–44)
AST: 221 U/L — ABNORMAL HIGH (ref 15–41)
Albumin: 2.7 g/dL — ABNORMAL LOW (ref 3.5–5.0)
Alkaline Phosphatase: 73 U/L (ref 38–126)
Anion gap: 6 (ref 5–15)
BUN: 30 mg/dL — ABNORMAL HIGH (ref 6–20)
CO2: 27 mmol/L (ref 22–32)
Calcium: 8.6 mg/dL — ABNORMAL LOW (ref 8.9–10.3)
Chloride: 107 mmol/L (ref 98–111)
Creatinine, Ser: 3.08 mg/dL — ABNORMAL HIGH (ref 0.61–1.24)
GFR, Estimated: 22 mL/min — ABNORMAL LOW (ref >60)
Glucose, Bld: 105 mg/dL — ABNORMAL HIGH (ref 70–99)
Potassium: 4.4 mmol/L (ref 3.5–5.1)
Sodium: 140 mmol/L (ref 135–145)
Total Bilirubin: 0.7 mg/dL (ref 0.3–1.2)
Total Protein: 5.3 g/dL — ABNORMAL LOW (ref 6.5–8.1)

## 2022-03-27 LAB — CK: Total CK: 14319 U/L — ABNORMAL HIGH (ref 49–397)

## 2022-03-27 MED ORDER — ARIPIPRAZOLE 10 MG PO TABS
10.0000 mg | ORAL_TABLET | Freq: Every day | ORAL | Status: DC
  Administered 2022-03-27 – 2022-03-28 (×2): 10 mg via ORAL
  Filled 2022-03-27 (×2): qty 1

## 2022-03-27 MED ORDER — LACTATED RINGERS IV SOLN: INTRAVENOUS | Status: DC

## 2022-03-27 NOTE — Progress Notes (Signed)
Physical Therapy Treatment Patient Details Name: Charles Estes MRN: [id number] DOB: April 11, 1961 Today's Date: 03/27/2022   History of Present Illness Pt is a 61 y/o male presenting on 10/21 for AMS. Found with rhabdomyolysis and acute metabolic encphalopathy, transaminitis. PMH includes: bipolar disorder, polysubstance abuse (including cocaine abuse), essential HTN, R ORIF ulna fx nonunion.    PT Comments    Pt with continued progress with functional mobility this session with focus on gait and progression of stair training. Pt demonstrating bed mobility, tranfers, gait and stair negotiation at supervision level without AD with no overt LOB noted.  Pt continues to be limited by impaired cognition, A&O x4, however pt slightly impulsive with mobility. Continued to encourage pt to use resources provided for finding housing as pt endorsing no success up to this session. Pt continues to benefit from skilled PT services to progress toward functional mobility goals.    Recommendations for follow up therapy are one component of a multi-disciplinary discharge planning process, led by the attending physician.  Recommendations may be updated based on patient status, additional functional criteria and insurance authorization.  Follow Up Recommendations  No PT follow up     Assistance Recommended at Discharge PRN  Patient can return home with the following A little help with walking and/or transfers;A little help with bathing/dressing/bathroom;Assist for transportation   Equipment Recommendations  None recommended by PT    Recommendations for Other Services       Precautions / Restrictions Precautions Precautions: Fall Restrictions Weight Bearing Restrictions: No     Mobility  Bed Mobility Overal bed mobility: Needs Assistance Bed Mobility: Supine to Sit, Sit to Supine     Supine to sit: Supervision Sit to supine: Supervision        Transfers Overall transfer level: Needs  assistance Equipment used: None Transfers: Sit to/from Stand Sit to Stand: Supervision           General transfer comment: Supervision for safety. Stood from [employer] x2    Ambulation/Gait Ambulation/Gait assistance: Scientist, forensic (Feet): 200 Feet Assistive device: None Gait Pattern/deviations: Step-through pattern, Wide base of support, Decreased step length - right, Decreased step length - left Gait velocity: decreased     General Gait Details: Slow steady gait   Stairs Stairs: Yes Stairs assistance: Supervision Stair Management: Alternating pattern, Step to pattern, Forwards, Two rails Number of Stairs: 5 (x 4 bouts) General stair comments: Good, safe technique using Bil rails for support. step over step on ascent, step to on descent   Wheelchair Mobility    Modified Rankin (Stroke Patients Only)       Balance Overall balance assessment: Needs assistance Sitting-balance support: Feet supported, No upper extremity supported Sitting balance-Leahy Scale: Good Sitting balance - Comments: limited dynamically   Standing balance support: During functional activity Standing balance-Leahy Scale: Good Standing balance comment: Able to stand at sink and wash hands without difficulty reaching outside BoS. Able to reach down towards the floor and pick up butter without LOB.                            Cognition Arousal/Alertness: Awake/alert Behavior During Therapy: WFL for tasks assessed/performed Overall Cognitive Status: Impaired/Different from baseline Area of Impairment: Problem solving, Attention, Awareness                   Current Attention Level: Sustained Memory: Decreased short-term memory     Awareness: Emergent Problem Solving: Slow processing, Decreased  initiation, Difficulty sequencing, Requires verbal cues          Exercises      General Comments General comments (skin integrity, edema, etc.): continues c/o nausea       Pertinent Vitals/Pain Pain Assessment Pain Assessment: No/denies pain Faces Pain Scale: Hurts little more Pain Location: head Pain Descriptors / Indicators: Headache Pain Intervention(s): Limited activity within patient's tolerance, Monitored during session    Home Living                          Prior Function            PT Goals (current goals can now be found in the care plan section) Acute Rehab PT Goals PT Goal Formulation: With patient Time For Goal Achievement: 04/07/22    Frequency    Min 3X/week      PT Plan Current plan remains appropriate    Co-evaluation              AM-PAC PT "6 Clicks" Mobility   Outcome Measure  Help needed turning from your back to your side while in a flat bed without using bedrails?: A Little Help needed moving from lying on your back to sitting on the side of a flat bed without using bedrails?: A Little Help needed moving to and from a bed to a chair (including a wheelchair)?: A Little Help needed standing up from a chair using your arms (e.g., wheelchair or bedside chair)?: A Little Help needed to walk in hospital room?: A Little Help needed climbing 3-5 steps with a railing? : A Little 6 Click Score: 18    End of Session Equipment Utilized During Treatment: Gait belt Activity Tolerance: Patient tolerated treatment well Patient left: in bed;with call bell/phone within reach Nurse Communication: Mobility status PT Visit Diagnosis: Pain;Difficulty in walking, not elsewhere classified (R26.2)     Time: 3818-2993 PT Time Calculation (min) (ACUTE ONLY): 18 min  Charges:  $Therapeutic Activity: 8-22 mins                     Leopold Smyers R. PTA Acute Rehabilitation Services Office: [phone]    Catalina Antigua 03/27/2022, 4:23 PM

## 2022-03-27 NOTE — Progress Notes (Signed)
Occupational Therapy Treatment Patient Details Name: Charles Estes MRN: [id number] DOB: 1960-11-28 Today's Date: 03/27/2022   History of present illness Pt is a 61 y/o male presenting on 10/21 for AMS. Found with rhabdomyolysis and acute metabolic encphalopathy, transaminitis. PMH includes: bipolar disorder, polysubstance abuse (including cocaine abuse), essential HTN, R ORIF ulna fx nonunion.   OT comments  Patient session focus on medi-cog assessment to determine cognition and safety to return to home environment. Patient with noted perseveration, slow processing, and inability to complete medi-cog grid independently. Patient requiring step by step verbal cues to complete medi-cog, therefore unable to score for official use. Due to current level of cognition, patient is at risk for polypharmacy or neglect with regard to his medication regimen. Patient would currently require assistance for medication management or another strategy to manage home medication to ensure safety. OT recommendation remains appropriate, however continue to endorse external assist for higher level cognitive tasks.    Recommendations for follow up therapy are one component of a multi-disciplinary discharge planning process, led by the attending physician.  Recommendations may be updated based on patient status, additional functional criteria and insurance authorization.    Follow Up Recommendations  Home health OT    Assistance Recommended at Discharge Intermittent Supervision/Assistance  Patient can return home with the following  A little help with walking and/or transfers;Assistance with cooking/housework;Direct supervision/assist for medications management;Direct supervision/assist for financial management;Assist for transportation;Help with stairs or ramp for entrance;A little help with bathing/dressing/bathroom   Equipment Recommendations  BSC/3in1    Recommendations for Other Services      Precautions /  Restrictions Precautions Precautions: Fall Restrictions Weight Bearing Restrictions: No       Mobility Bed Mobility                    Transfers                         Balance                                           ADL either performed or assessed with clinical judgement   ADL Overall ADL's : Needs assistance/impaired                                       General ADL Comments: Session focus on medi cog    Extremity/Trunk Assessment              Vision       Perception     Praxis      Cognition Arousal/Alertness: Awake/alert Behavior During Therapy: Restless, WFL for tasks assessed/performed Overall Cognitive Status: Impaired/Different from baseline Area of Impairment: Problem solving, Attention, Awareness, Memory                   Current Attention Level: Selective Memory: Decreased short-term memory     Awareness: Emergent Problem Solving: Requires verbal cues, Difficulty sequencing General Comments: Medi-cog completed with patient, requiring max A and step by step cues in order to complete        Exercises      Shoulder Instructions       General Comments      Pertinent Vitals/ Pain       Pain Assessment Pain  Assessment: No/denies pain  Home Living                                          Prior Functioning/Environment              Frequency  Min 2X/week        Progress Toward Goals  OT Goals(current goals can now be found in the care plan section)  Progress towards OT goals: Progressing toward goals  Acute Rehab OT Goals Patient Stated Goal: to go home OT Goal Formulation: With patient Time For Goal Achievement: 04/07/22 Potential to Achieve Goals: Good  Plan Frequency remains appropriate;Discharge plan remains appropriate    Co-evaluation                 AM-PAC OT "6 Clicks" Daily Activity     Outcome Measure   Help from  another person eating meals?: A Little Help from another person taking care of personal grooming?: A Little Help from another person toileting, which includes using toliet, bedpan, or urinal?: A Little Help from another person bathing (including washing, rinsing, drying)?: A Little Help from another person to put on and taking off regular upper body clothing?: A Little Help from another person to put on and taking off regular lower body clothing?: A Little 6 Click Score: 18    End of Session Equipment Utilized During Treatment: Other (comment) (medi cog assessment)  OT Visit Diagnosis: Other abnormalities of gait and mobility (R26.89);Muscle weakness (generalized) (M62.81);Pain;Other symptoms and signs involving cognitive function   Activity Tolerance Patient tolerated treatment well   Patient Left in bed;with call bell/phone within reach;with bed alarm set   Nurse Communication Mobility status        Time: 4696-2952 OT Time Calculation (min): 20 min  Charges: OT General Charges $OT Visit: 1 Visit OT Treatments $Self Care/Home Management : 8-22 mins  Pollyann Glen E. Vinayak Bobier, OTR/L Acute Rehabilitation Services [phone]   Cherlyn Cushing 03/27/2022, 2:10 PM

## 2022-03-27 NOTE — TOC Progression Note (Signed)
Transition of Care [facility]) - Progression Note    Patient Details  Name: Charles Estes MRN: [id number] Date of Birth: [birth year]  Transition [facility] CM/SW Contact  [name], RN Phone Number: 03/27/2022, 3:13 PM  Clinical Narrative:    CM met with the patient and he had not called any of the resources provided him by CM. CM encouraged him to reach out to motels and shelters. Pt making call to shelter when CM left room.  TOC following.   Expected Discharge Plan: Homeless Shelter Barriers to Discharge: Continued Medical Work up  Expected Discharge Plan and Services Expected Discharge Plan: Homeless Shelter   Discharge Planning Services: CM Consult   Living arrangements for the past 2 months: Homeless                                       Social Determinants of Health (SDOH) Interventions    Readmission Risk Interventions     No data to display

## 2022-03-27 NOTE — Progress Notes (Signed)
PROGRESS NOTE                                                                                                                                                                                                             Patient Demographics:    Charles Estes, is a 61 y.o. male, DOB - [birth year], WUJ:[id number]  Outpatient Primary MD for the patient is El-Khoury, Irene Shipper, MD    LOS - 4  Admit date - 03/23/2022    Chief Complaint  Patient presents with   Altered Mental Status       Brief Narrative (HPI from H&P)   61 year old male with medical history of bipolar disorder, polysubstance abuse, including cocaine abuse, essential hypertension who was admitted to [facility] on 03/23/2022 with acute metabolic encephalopathy in setting of rhabdomyolysis, UDS was positive for cocaine.  He was dropped in the ER by unknown individual in a confused state, further work-up suggested rhabdomyolysis, toxic and metabolic encephalopathy along with acute renal failure.   Subjective:   Patient in bed denies any headache, no chest pain, intermittent diffuse abdominal pain according to the patient, no focal weakness.  Currently denies any suicidal ideation.   Assessment  & Plan :   Toxic and metabolic encephalopathy caused by severe dehydration, cocaine and polysubstance abuse - last use on the 19th, says he takes cocaine once a month and plans not to do so again, no intentional drug overdose or suicidal ideation.  Currently mentation back to normal, head CT unremarkable.  No headache.  Counseled to quit all polysubstance abuse.  Clearly counseled not to use cocaine with Coreg.  Rhabdomyolysis in conjunction with severe dehydration with hypotension, AKI - continue IV fluids, renal function has now shown improvement serially, blood pressure has stabilized, postvoid morning bladder scan is 40 cc, renal function still worsening, renal ultrasound  was relatively stable this was discussed with urologist on-call Dr. Bjorn Pippin on 03/24/2022 followed up with CT renal stone protocol which was unremarkable.  Renal function improving continue gentle hydration.  There was question of chronic pyelonephritis on CT however UA is bland and there is no dysuria or fever will monitor.  Questionable Wellbutrin overdose.  Patient denies.  EKG stable.  Monitor.   Mild non-ACS pattern troponin elevation.  No chest pain likely due to cocaine abuse, chest pain-free, placed on  aspirin, beta-blocker and statin.  Stable EKG and echocardiogram with preserved EF and no wall motion abnormality.  Mild asymptomatic transaminitis.  Due to rhabdomyolysis.  Total bilirubin is fine, he does complain some vague abdominal pain from time to time but reports very nonspecific, exam is benign will check right upper quadrant ultrasound as well.  Dyslipidemia.  Placed on statin.   Note patient is homeless, after he was told that he will be discharged he is coming up with vague complaints every hour which include headache, chest pain, abdominal pain, he also reported some suicidal ideation to the support staff on 03/26/2022.  Work-up and exam benign.  He now sees he never meant to commit suicide but just said that to the speech therapist yesterday, will have psych evaluate although I do not think he is at risk for committing suicide.  Social work assisting him with placement.       Condition - Fair  Family Communication  :  None  Code Status :  Full  Consults  : Psychiatry consulted 03/26/2022 at 13.15 PM  PUD Prophylaxis :    Procedures  :     CT renal stone protocol.  Nonacute.    Renal ultrasound.  Nonacute, port discussed with Dr. Wilson Singer neurologist.  CT head.  Nonacute.  TTE - 1. Left ventricular ejection fraction, by estimation, is 60 to 65%. The left ventricle has normal function. Left ventricular endocardial border not optimally defined to evaluate regional  wall motion. Left ventricular diastolic parameters are consistent with Grade I diastolic dysfunction (impaired relaxation).  2. Right ventricular systolic function is normal. The right ventricular size is normal.  3. The mitral valve is normal in structure. No evidence of mitral valve regurgitation. No evidence of mitral stenosis.  4. The aortic valve is tricuspid. Aortic valve regurgitation is not visualized. No aortic stenosis is present.  5. The inferior vena cava is normal in size with greater than 50% respiratory variability, suggesting right atrial pressure of 3 mmHg.      Disposition Plan  :    Status is: Inpatient  DVT Prophylaxis  :    heparin injection 5,000 Units Start: 03/24/22 0945 SCDs Start: 03/23/22 0551    Lab Results  Component Value Date   PLT 166 03/26/2022    Diet :  Diet Order             Diet Heart Room service appropriate? Yes; Fluid consistency: Thin  Diet effective now                    Inpatient Medications  Scheduled Meds:  amLODipine  10 mg Oral Daily   aspirin  81 mg Oral Daily   carvedilol  6.25 mg Oral BID WC   doxycycline  100 mg Oral Q12H   heparin injection (subcutaneous)  5,000 Units Subcutaneous Q8H   hydrALAZINE  50 mg Oral Q8H   tamsulosin  0.4 mg Oral Daily   Continuous Infusions:  lactated ringers     PRN Meds:.acetaminophen, alum & mag hydroxide-simeth, hydrALAZINE, HYDROcodone-acetaminophen, labetalol, ondansetron (ZOFRAN) IV    Objective:   Vitals:   03/26/22 2016 03/26/22 2307 03/27/22 0406 03/27/22 0816  BP: (!) 143/97 116/89 139/84 (!) 149/96  Pulse: 92 92 80 93  Resp: 20 17 19 18   Temp: 99.2 F (37.3 C) 98.8 F (37.1 C) 98.8 F (37.1 C) 98.5 F (36.9 C)  TempSrc: Oral   Oral  SpO2: 98% 95% 97% 99%  Weight:  Wt Readings from Last 3 Encounters:  03/23/22 89.4 kg  01/18/22 89.4 kg  01/17/22 93 kg     Intake/Output Summary (Last 24 hours) at 03/27/2022 0838 Last data filed at 03/27/2022  0500 Gross per 24 hour  Intake 360 ml  Output 1600 ml  Net -1240 ml     Physical Exam  Awake Alert, No new F.N deficits, Normal affect [place].AT,PERRAL Supple Neck, No JVD,   Symmetrical Chest wall movement, Good air movement bilaterally, CTAB RRR,No Gallops, Rubs or new Murmurs,  +ve B.Sounds, Abd Soft, No tenderness,   No Cyanosis, Clubbing or edema    RN pressure injury documentation: Pressure Injury 03/23/22 Buttocks Bilateral Stage 1 -  Intact skin with non-blanchable redness of a localized area usually over a bony prominence. (Active)  03/23/22 1640  Location: Buttocks  Location Orientation: Bilateral  Staging: Stage 1 -  Intact skin with non-blanchable redness of a localized area usually over a bony prominence.  Wound Description (Comments):   Present on Admission: Yes  Dressing Type Foam - Lift dressing to assess site every shift 03/26/22 0842      Data Review:    CBC Recent Labs  Lab 03/23/22 0035 03/23/22 0103 03/23/22 0230 03/23/22 1820 03/24/22 0553 03/25/22 0439 03/26/22 0623  WBC 12.1*  --   --  10.8* 10.1 9.1 7.2  HGB 15.5   < > 12.6* 13.4 12.3* 11.2* 10.8*  HCT 46.2   < > 37.0* 39.0 35.6* 31.8* 31.1*  PLT 266  --   --  133* 143* 144* 166  MCV 93.7  --   --  91.5 91.3 91.4 91.7  MCH 31.4  --   --  31.5 31.5 32.2 31.9  MCHC 33.5  --   --  34.4 34.6 35.2 34.7  RDW 13.4  --   --  13.2 13.3 13.3 13.2  LYMPHSABS 1.5  --   --  1.3 1.3 1.7 1.6  MONOABS 1.0  --   --  1.0 0.9 0.6 0.5  EOSABS 0.0  --   --  0.1 0.1 0.1 0.1  BASOSABS 0.0  --   --  0.0 0.0 0.1 0.0   < > = values in this interval not displayed.    Electrolytes Recent Labs  Lab 03/23/22 0035 03/23/22 0103 03/23/22 0200 03/23/22 0230 03/23/22 0441 03/23/22 0600 03/23/22 0930 03/24/22 0332 03/24/22 0553 03/25/22 0439 03/26/22 0623 03/27/22 0638  NA 136   < > 135   < >  --  137  --  140  --  140 141 140  K 3.8   < > 3.7   < >  --  3.8  --  3.6  --  3.4* 3.9 4.4  CL 97*   < > 102  --    --  100  --  106  --  107 106 107  CO2 21*  --  20*  --   --  21*  --  21*  --  GLUCOSE 131*   < > 95  --   --  145*  --  120*  --  102* 106* 105*  BUN 33*   < > 33*  --   --  34*  --  39*  --  38* 38* 30*  CREATININE 4.03*   < > 3.78*  --   --  3.69*  --  5.16*  --  4.76* 3.83* 3.08*  CALCIUM 9.3  --  8.6*  --   --  7.9*  --  8.5*  --  8.5* 8.6* 8.6*  AST 404*  --  394*  --   --  259*  --  238*  --  269* 213* 221*  ALT 138*  --  136*  --   --  110*  --  99*  --  100* 95* 134*  ALKPHOS 75  --  72  --   --  57  --  54  --  50 49 73  BILITOT 0.6  --  0.6  --   --  0.4  --  0.5  --  0.9 0.8 0.7  ALBUMIN 3.8  --  3.7  --   --  2.5*  --  2.3*  --  2.4* 2.5* 2.7*  MG  --   --   --   --   --  2.0  --   --  1.8 1.6* 2.2  --   CRP  --   --   --   --   --   --   --   --  7.1*  --   --   --   LATICACIDVEN  --   --  2.6*  --  1.8  --   --   --   --   --   --   --   INR 1.1  --   --   --   --   --  1.3*  --   --   --   --   --   TSH  --   --   --   --   --   --  1.006  --   --   --   --   --   AMMONIA  --   --   --   --  16  --   --   --   --   --   --   --   BNP  --   --   --   --   --   --   --   --  126.1* 81.4 190.7*  --    < > = values in this interval not displayed.    Radiology Reports DG Abd 1 View  Result Date: 03/27/2022 CLINICAL DATA:  Abdominal pain for several hours, initial encounter EXAM: ABDOMEN - 1 VIEW COMPARISON:  CT from 03/24/2022 FINDINGS: Scattered large and small bowel gas is noted. No obstructive changes are seen. No free air is noted. No bony abnormality is seen. IMPRESSION: No acute abnormality noted. Electronically Signed   By: Inez Catalina M.D.   On: 03/27/2022 01:46   ECHOCARDIOGRAM COMPLETE  Result Date: 03/24/2022    ECHOCARDIOGRAM REPORT   Patient Name:   Charles Estes Date of Exam: 03/24/2022 Medical Rec #:  [id number]   Height:       66.0 in Accession #:    [id number]  Weight:       197.0 lb Date of Birth:  Jun 22, 1960   BSA:          1.987 m Patient Age:     59 years    BP:           173/100 mmHg Patient Gender: M           HR:           94 bpm. Exam Location:  Inpatient Procedure: 2D Echo, Color Doppler and Cardiac Doppler Indications:    Elevated Troponins  History:        Patient has no prior history of Echocardiogram examinations.                 Risk Factors:Hypertension.  Sonographer:    Irving Burton Senior RDCS Referring Phys: [id number] Angie Fava  Sonographer Comments: No parasternal window due to lung interference. IMPRESSIONS  1. Left ventricular ejection fraction, by estimation, is 60 to 65%. The left ventricle has normal function. Left ventricular endocardial border not optimally defined to evaluate regional wall motion. Left ventricular diastolic parameters are consistent with Grade I diastolic dysfunction (impaired relaxation).  2. Right ventricular systolic function is normal. The right ventricular size is normal.  3. The mitral valve is normal in structure. No evidence of mitral valve regurgitation. No evidence of mitral stenosis.  4. The aortic valve is tricuspid. Aortic valve regurgitation is not visualized. No aortic stenosis is present.  5. The inferior vena cava is normal in size with greater than 50% respiratory variability, suggesting right atrial pressure of 3 mmHg. FINDINGS  Left Ventricle: Left ventricular ejection fraction, by estimation, is 60 to 65%. The left ventricle has normal function. Left ventricular endocardial border not optimally defined to evaluate regional wall motion. The left ventricular internal cavity size was normal in size. There is no left ventricular hypertrophy. Left ventricular diastolic parameters are consistent with Grade I diastolic dysfunction (impaired relaxation). Normal left ventricular filling pressure. Right Ventricle: The right ventricular size is normal. Right vetricular wall thickness was not well visualized. Right ventricular systolic function is normal. Left Atrium: Left atrial size was normal in size. Right  Atrium: Right atrial size was normal in size. Pericardium: There is no evidence of pericardial effusion. Mitral Valve: The mitral valve is normal in structure. No evidence of mitral valve regurgitation. No evidence of mitral valve stenosis. Tricuspid Valve: The tricuspid valve is normal in structure. Tricuspid valve regurgitation is not demonstrated. No evidence of tricuspid stenosis. Aortic Valve: The aortic valve is tricuspid. Aortic valve regurgitation is not visualized. No aortic stenosis is present. Aortic valve mean gradient measures 3.3 mmHg. Aortic valve peak gradient measures 8.3 mmHg. Aortic valve area, by VTI measures 2.02 cm. Pulmonic Valve: The pulmonic valve was not well visualized. Pulmonic valve regurgitation is not visualized. No evidence of pulmonic stenosis. Aorta: The aortic root was not well visualized. Venous: The inferior vena cava is normal in size with greater than 50% respiratory variability, suggesting right atrial pressure of 3 mmHg. IAS/Shunts: No atrial level shunt detected by color flow Doppler.  LEFT VENTRICLE PLAX 2D LVOT diam:     2.00 cm   Diastology LV SV:         48        LV e' medial:    5.77 cm/s LV SV Index:   24        LV E/e' medial:  8.4 LVOT Area:     3.14 cm  LV e' lateral:   11.10 cm/s                          LV E/e' lateral: 4.4  RIGHT VENTRICLE RV S prime:     14.50 cm/s TAPSE (M-mode): 2.3 cm LEFT ATRIUM             Index        RIGHT ATRIUM           Index LA Vol (A2C):   43.8 ml 22.04 ml/m  RA  Area:     14.80 cm LA Vol (A4C):   55.1 ml 27.73 ml/m  RA Volume:   33.30 ml  16.76 ml/m LA Biplane Vol: 53.6 ml 26.98 ml/m  AORTIC VALVE AV Area (Vmax):    2.24 cm AV Area (Vmean):   2.91 cm AV Area (VTI):     2.02 cm AV Vmax:           144.38 cm/s AV Vmean:          81.393 cm/s AV VTI:            0.238 m AV Peak Grad:      8.3 mmHg AV Mean Grad:      3.3 mmHg LVOT Vmax:         103.00 cm/s LVOT Vmean:        75.500 cm/s LVOT VTI:          0.153 m LVOT/AV VTI  ratio: 0.64 MITRAL VALVE MV Area (PHT): 3.65 cm    SHUNTS MV Decel Time: 208 msec    Systemic VTI:  0.15 m MV E velocity: 48.60 cm/s  Systemic Diam: 2.00 cm MV A velocity: 89.70 cm/s MV E/A ratio:  0.54 Dina RichJonathan Branch MD Electronically signed by Dina RichJonathan Branch MD Signature Date/Time: 03/24/2022/2:55:08 PM    Final    CT RENAL STONE STUDY  Result Date: 03/24/2022 CLINICAL DATA:  Renal stones, hydronephrosis EXAM: CT ABDOMEN AND PELVIS WITHOUT CONTRAST TECHNIQUE: Multidetector CT imaging of the abdomen and pelvis was performed following the standard protocol without IV contrast. RADIATION DOSE REDUCTION: This exam was performed according to the departmental dose-optimization program which includes automated exposure control, adjustment of the mA and/or kV according to patient size and/or use of iterative reconstruction technique. COMPARISON:  Renal sonogram done on 03/24/2022 FINDINGS: Lower chest: Coronary artery calcifications are seen. Small linear densities are seen in lower lung fields suggesting scarring or subsegmental atelectasis. Hepatobiliary: No focal abnormalities are seen in liver. Gallbladder is not distended. Pancreas: Unremarkable. Spleen: There are small calcified granulomas in spleen. Spleen is not enlarged. Adrenals/Urinary Tract: Adrenals are unremarkable. There is no hydronephrosis. There are no demonstrable renal or ureteral stones. Mild left hydronephrosis and possible left renal calculus seen in the previous sonogram could not be identified in the CT images. There is perinephric stranding around both kidneys. Urinary bladder is unremarkable. Stomach/Bowel: Small hiatal hernia is seen. Small bowel loops are not dilated. Appendix is unremarkable. There is no significant wall thickening in colon. There is no pericolic stranding. Vascular/Lymphatic: Scattered arterial calcifications are seen. Reproductive: There are few coarse calcifications in prostate. Other: There is no ascites or  pneumoperitoneum. Umbilical hernia containing fat is seen. Inguinal hernias containing fat are noted. Musculoskeletal: There is minimal anterolisthesis at L4-L5 level. Degenerative changes are noted in facet joints in lower lumbar spine. IMPRESSION: There is no evidence of intestinal obstruction or pneumoperitoneum. There is no hydronephrosis. Appendix is not dilated. There is perinephric stranding around both kidneys. This may be residual from previous ureteric obstruction or suggest acute or chronic pyelonephritis. There are no demonstrable opaque renal or ureteral stones. Arteriosclerosis.  Coronary artery disease.  Small hiatal hernia. Other findings as described in the body of the report. Electronically Signed   By: Ernie AvenaPalani  Rathinasamy M.D.   On: 03/24/2022 13:34   US RENAL  Result Date: 03/24/2022 CLINICAL DATA:  Acute kidney injury EXAM: RENAL / URINARY TRACT ULTRASOUND COMPLETE COMPARISON:  CT 01/22/2022 FINDINGS: Right Kidney: Renal measurements: 11.4 x 5.3 x  5.4 cm = volume: 172 mL. Echogenicity within normal limits. No mass, shadowing stone, or hydronephrosis visualized. Left Kidney: Renal measurements: 2 11.1 x 7.2 x 5.8 cm = volume: 241 mL. Echogenicity within normal limits. 4 mm stone within the interpolar region of the left kidney. Mild hydronephrosis. No mass is visualized. Bladder: Appears normal for degree of bladder distention. Other: None. IMPRESSION: 1. Mild left hydronephrosis. 4 mm stone within the interpolar region of the left kidney. 2. Unremarkable right kidney. Electronically Signed   By: Duanne Guess D.O.   On: 03/24/2022 11:21   DG Chest Port 1 View  Result Date: 03/24/2022 CLINICAL DATA:  61 year old male with history of shortness of breath. EXAM: PORTABLE CHEST 1 VIEW COMPARISON:  Chest x-ray 03/23/2020. FINDINGS: Lung volumes are normal. No consolidative airspace disease. No pleural effusions. No pneumothorax. No pulmonary nodule or mass noted. Pulmonary vasculature and  the cardiomediastinal silhouette are within normal limits. Atherosclerotic calcifications are noted in the thoracic aorta. IMPRESSION: 1.  No radiographic evidence of acute cardiopulmonary disease. 2. Aortic atherosclerosis. Electronically Signed   By: Trudie Reed M.D.   On: 03/24/2022 06:10      Signature  Susa Raring M.D on 03/27/2022 at 8:38 AM   -  To page go to [email or web address]

## 2022-03-27 NOTE — Progress Notes (Signed)
Overnight progress note  Notified by RN that patient is complaining of severe generalized abdominal pain and nausea.  He is spitting his saliva but no vomiting witnessed.  Vital signs stable.  Patient seen and examined at bedside.  Resting comfortably.  He denies any abdominal pain at this time but states earlier he was having generalized abdominal pain and was spitting saliva but did not vomit any stomach contents.  He reports 2 bowel movements yesterday, last one was this evening which he describes as loose stool.  On exam, abdomen soft and nontender to palpation, bowel sounds present, and no peritoneal signs.  Chart reviewed, CT renal stone study done 10/22 showing no evidence of intestinal obstruction or pneumoperitoneum.  No hydronephrosis or kidney stones.  There was perinephric stranding around both kidneys which may be residual from previous ureteric obstruction versus ?chronic pyelonephritis.  UA was checked and not suggestive of infection.  No fever or leukocytosis.  Patient is not endorsing any urinary symptoms at present.  -KUB ordered and x-ray tech at bedside

## 2022-03-27 NOTE — Progress Notes (Signed)
Mobility Specialist: Progress Note   03/27/22 1236  Mobility  Activity Ambulated with assistance in hallway  Level of Assistance Standby assist, set-up cues, supervision of patient - no hands on  Assistive Device Other (Comment) (IV pole)  Distance Ambulated (ft) 250 ft  Activity Response Tolerated well  Mobility Referral Yes  $Mobility charge 1 Mobility   Pt received in the bed sleeping, easily awakened and agreeable to mobility. C/o nausea and headache during ambulation, RN notified. Pt back to bed after session with call bell and phone in reach.   [place] Arnella Pralle Mobility Specialist Secure Chat Only

## 2022-03-27 NOTE — Progress Notes (Signed)
DVT Prophylaxis refusal:  This pt has been refusing SQ heparin injections for DVT prophylaxis regimen.  Dr. Ronnie Derby notified per secure chat 10/25 @2030 .  MD stated to document pt's response to denying this treatment plan.  Patient has refused heparin injections since 1418 on 10/24. No injections since 0627 on 03/26/22

## 2022-03-27 NOTE — Consult Note (Signed)
[facility] New Face-to-Face Psychiatric Evaluation   Service Date: March 27, 2022 LOS:  LOS: 4 days    Assessment  Charles Estes is a 61 y.o. male admitted medically for 03/23/2022 12:09 AM for acute metabolic encephalopathy in setting of rhabdomyolysis and cocaine use. He carries the psychiatric diagnoses of cocaine abuse, bipolar disorder and has a past medical history of  essential hypertension.Psychiatry was consulted for suicidal ideation by Dr. Candiss Norse.    His current presentation of vague mood, psychotic, and somatic complaints, court date today, and homelessness is most consistent with malingering. Does not meet inpatient psychiatry criteria nor IVC criteria.  Current outpatient psychotropic medications include abilify 10 and prozac 10 and historically he has had a fair response to these medications. He was not compliant with medications prior to admission as evidenced by no fill since [facility] admission. On initial examination, patient is not responding to internal stimuli, not appear depressed, not manic, but is mildly anxious about housing and legal problems. Resumed abilify and prozac given hx of bipolar disorder and tolerated it well.  Please see plan below for detailed recommendations.   Diagnoses:  Active Hospital problems: Principal Problem:   Acute encephalopathy Active Problems:   AKI (acute kidney injury) ([place])   Rhabdomyolysis   SIRS (systemic inflammatory response syndrome) (HCC)   High anion gap metabolic acidosis   Elevated troponin   Transaminitis     Plan  ## Safety and Observation Level:  - Based on my clinical evaluation, I estimate the patient to be at minimal risk of self harm in the current setting   ## Bipolar Disorder ## Homelessness ## Malingering -- Restart abilify 10 mg qd for possible psychosis -- Restart prozac 10 mg qd for anxiety and depressive symptoms  ##Cocaine use -- Recommend cessation  ## Medical Decision Making  Capacity:  Not formally assessed  ## Further Work-up:  -- per primary team    -- most recent EKG on 03/26/22 had QtC of 442   ## Disposition:  -- per primary  ## Behavioral / Environmental:  -- none  ##Legal Status Not under IVC  Thank you for this consult request. Recommendations have been communicated to the primary team.  We will sign off at this time.   France Ravens, MD   NEW history  Relevant Aspects of Hospital Course:  Admitted on 03/23/2022 for acute metabolic encephalopathy secondary to rhabdomyolysis and cocaine use.  Patient Report:  Patient seen and assessed at bedside.  Patient reports vague symptoms of myalgias, abdominal.  Patient states that he had just vomited prior to coming in.  Patient states recent stressors regarding restraining order, court date today, housing instability.  Patient denies present SI/HI/AVH.  Patient initially stated he wanted to "jump out the window" but then stated that he said this primarily because he did not feel well at the time of assessment.  Patient stated that he did not actually want to jump out the window.  Patient stated that he is able to contract for safety.  Patient also states that he has had some auditory hallucinations in the past but states that these have been chronic and not acutely bothering him at this time.   Discussed outpatient psychiatry and therapy options specifically [facility] for walk-in and patient stated that he would consider going there after discharge.  Patient was open to resuming Prozac and Abilify as he was discharged on this from last [facility] admission in August.  Patient had no  acute psychiatric concerns at this time was primarily worried about housing and legal problems.  ROS:  Denies present SI/HI/AVH   Psychiatric History:  Information collected from chart Dx: Bipolar Disorder with psychotic features Hospitalization: [facility] 01/2022 Meds: discharged with prozac 10 and  abilify 10   Social History:  Tobacco use: endorses Alcohol use: endorses Drug use: cocaine  Family History:   The patient's family history includes Heart attack in his father, mother, sister, and sister; Hypertension in his brother, father, mother, and sister.  Medical History: Past Medical History:  Diagnosis Date   Depression    GERD (gastroesophageal reflux disease)    Hypertension    Pathologic ulnar fracture with malunion    right    Surgical History: Past Surgical History:  Procedure Laterality Date   MANDIBLE FRACTURE SURGERY  2014   ORIF ULNAR FRACTURE Right 07/16/2017   Procedure: OPEN REDUCTION INTERNAL FIXATION (ORIF) RIGHT ULNA FRACTURE NONUNION;  Surgeon: Tarry Kos, MD;  Location: [facility];  Service: Orthopedics;  Laterality: Right;    Medications:   Current Facility-Administered Medications:    acetaminophen (TYLENOL) tablet 650 mg, 650 mg, Oral, Q6H PRN, Leroy Sea, MD, 650 mg at 03/26/22 2023   alum & mag hydroxide-simeth (MAALOX/MYLANTA) 200-200-20 MG/5ML suspension 15-30 mL, 15-30 mL, Oral, Q6H PRN, Leroy Sea, MD, 30 mL at 03/25/22 1606   amLODipine (NORVASC) tablet 10 mg, 10 mg, Oral, Daily, Leroy Sea, MD, 10 mg at 03/27/22 0843   ARIPiprazole (ABILIFY) tablet 10 mg, 10 mg, Oral, Daily, Park Pope, MD   aspirin chewable tablet 81 mg, 81 mg, Oral, Daily, Leroy Sea, MD, 81 mg at 03/27/22 0843   carvedilol (COREG) tablet 6.25 mg, 6.25 mg, Oral, BID WC, Susa Raring K, MD, 6.25 mg at 03/27/22 0842   doxycycline (VIBRA-TABS) tablet 100 mg, 100 mg, Oral, Q12H, Susa Raring K, MD, 100 mg at 03/27/22 0843   heparin injection 5,000 Units, 5,000 Units, Subcutaneous, Q8H, Leroy Sea, MD, 5,000 Units at 03/26/22 7209   hydrALAZINE (APRESOLINE) injection 10 mg, 10 mg, Intravenous, Q6H PRN, Leroy Sea, MD, 10 mg at 03/24/22 1801   hydrALAZINE (APRESOLINE) tablet 50 mg, 50 mg, Oral, Q8H, Leroy Sea, MD, 50 mg at 03/27/22 0650   HYDROcodone-acetaminophen (NORCO/VICODIN) 5-325 MG per tablet 1 tablet, 1 tablet, Oral, Q6H PRN, Leroy Sea, MD, 1 tablet at 03/27/22 0843   labetalol (NORMODYNE) injection 10 mg, 10 mg, Intravenous, Q2H PRN, Howerter, Justin B, DO, 10 mg at 03/24/22 0143   lactated ringers infusion, , Intravenous, Continuous, Leroy Sea, MD, Last Rate: 50 mL/hr at 03/27/22 1134, New Bag at 03/27/22 1134   ondansetron (ZOFRAN) injection 4 mg, 4 mg, Intravenous, Q6H PRN, Leroy Sea, MD, 4 mg at 03/27/22 0932   tamsulosin (FLOMAX) capsule 0.4 mg, 0.4 mg, Oral, Daily, Leroy Sea, MD, 0.4 mg at 03/27/22 0843  Allergies: Allergies  Allergen Reactions   Paxil [Paroxetine] Diarrhea and Other (See Comments)    Mouth sores       Objective  Vital signs:  Temp:  [97.6 F (36.4 C)-99.2 F (37.3 C)] 97.6 F (36.4 C) (10/25 1211) Pulse Rate:  [78-93] 78 (10/25 1211) Resp:  [16-20] 18 (10/25 1211) BP: (116-149)/(84-97) 149/94 (10/25 1211) SpO2:  [95 %-99 %] 95 % (10/25 1211)  Psychiatric Specialty Exam:  Presentation  General Appearance:  Appropriate for Environment; Casual  Eye Contact: Good  Speech: Clear and Coherent; Normal Rate  Speech Volume: Normal  Handedness: Right   Mood and Affect  Mood: Anxious  Affect: Appropriate; Congruent   Thought Process  Thought Processes: Coherent; Goal Directed; Linear  Descriptions of Associations:Intact  Orientation:Full (Time, Place and Person)  Thought Content:Logical  History of Schizophrenia/Schizoaffective disorder:Yes  Duration of Psychotic Symptoms:No data recorded Hallucinations:Hallucinations: None  Ideas of Reference:None  Suicidal Thoughts:Suicidal Thoughts: No  Homicidal Thoughts:Homicidal Thoughts: No   Sensorium  Memory: Immediate Good; Recent Good; Remote Good  Judgment: Fair  Insight: Fair   Art therapist   Concentration: Good  Attention Span: Good  Recall: Good  Fund of Knowledge: Good  Language: Good   Psychomotor Activity  Psychomotor Activity:Psychomotor Activity: Normal   Assets  Assets: Communication Skills; Desire for Improvement; Physical Health   Sleep  Sleep:Sleep: Good    Physical Exam: Physical Exam ROS Blood pressure (!) 149/94, pulse 78, temperature 97.6 F (36.4 C), temperature source Tympanic, resp. rate 18, weight 89.4 kg, SpO2 95 %. Body mass index is 31.8 kg/m.

## 2022-03-28 ENCOUNTER — Other Ambulatory Visit (HOSPITAL_COMMUNITY): Payer: Self-pay

## 2022-03-28 LAB — COMPREHENSIVE METABOLIC PANEL
ALT: 198 U/L — ABNORMAL HIGH (ref 0–44)
AST: 256 U/L — ABNORMAL HIGH (ref 15–41)
Albumin: 2.7 g/dL — ABNORMAL LOW (ref 3.5–5.0)
Alkaline Phosphatase: 67 U/L (ref 38–126)
Anion gap: 9 (ref 5–15)
BUN: 24 mg/dL — ABNORMAL HIGH (ref 6–20)
CO2: 27 mmol/L (ref 22–32)
Calcium: 8.7 mg/dL — ABNORMAL LOW (ref 8.9–10.3)
Chloride: 104 mmol/L (ref 98–111)
Creatinine, Ser: 2.57 mg/dL — ABNORMAL HIGH (ref 0.61–1.24)
GFR, Estimated: 28 mL/min — ABNORMAL LOW (ref >60)
Glucose, Bld: 98 mg/dL (ref 70–99)
Potassium: 4.3 mmol/L (ref 3.5–5.1)
Sodium: 140 mmol/L (ref 135–145)
Total Bilirubin: 1 mg/dL (ref 0.3–1.2)
Total Protein: 5.1 g/dL — ABNORMAL LOW (ref 6.5–8.1)

## 2022-03-28 LAB — CBC WITH DIFFERENTIAL/PLATELET
Abs Immature Granulocytes: 0.04 10*3/uL (ref 0.00–0.07)
Basophils Absolute: 0 10*3/uL (ref 0.0–0.1)
Basophils Relative: 1 %
Eosinophils Absolute: 0.2 10*3/uL (ref 0.0–0.5)
Eosinophils Relative: 3 %
HCT: 32.1 % — ABNORMAL LOW (ref 39.0–52.0)
Hemoglobin: 10.8 g/dL — ABNORMAL LOW (ref 13.0–17.0)
Immature Granulocytes: 1 %
Lymphocytes Relative: 26 %
Lymphs Abs: 1.7 10*3/uL (ref 0.7–4.0)
MCH: 31.6 pg (ref 26.0–34.0)
MCHC: 33.6 g/dL (ref 30.0–36.0)
MCV: 93.9 fL (ref 80.0–100.0)
Monocytes Absolute: 0.6 10*3/uL (ref 0.1–1.0)
Monocytes Relative: 9 %
Neutro Abs: 4 10*3/uL (ref 1.7–7.7)
Neutrophils Relative %: 60 %
Platelets: 180 10*3/uL (ref 150–400)
RBC: 3.42 MIL/uL — ABNORMAL LOW (ref 4.22–5.81)
RDW: 13.1 % (ref 11.5–15.5)
WBC: 6.4 10*3/uL (ref 4.0–10.5)
nRBC: 0 % (ref 0.0–0.2)

## 2022-03-28 LAB — CULTURE, BLOOD (ROUTINE X 2)
Culture: NO GROWTH
Culture: NO GROWTH
Special Requests: ADEQUATE

## 2022-03-28 LAB — CK: Total CK: 6115 U/L — ABNORMAL HIGH (ref 49–397)

## 2022-03-28 MED ORDER — AMLODIPINE BESYLATE 10 MG PO TABS
10.0000 mg | ORAL_TABLET | Freq: Every day | ORAL | 0 refills | Status: DC
  Filled 2022-03-28: qty 30, 30d supply, fill #0

## 2022-03-28 MED ORDER — PANTOPRAZOLE SODIUM 40 MG PO TBEC
40.0000 mg | DELAYED_RELEASE_TABLET | Freq: Every day | ORAL | 0 refills | Status: DC
  Filled 2022-03-28: qty 30, 30d supply, fill #0

## 2022-03-28 MED ORDER — HYDRALAZINE HCL 50 MG PO TABS
50.0000 mg | ORAL_TABLET | Freq: Three times a day (TID) | ORAL | 0 refills | Status: DC
  Filled 2022-03-28: qty 90, 30d supply, fill #0

## 2022-03-28 MED ORDER — ARIPIPRAZOLE 10 MG PO TABS
10.0000 mg | ORAL_TABLET | Freq: Every day | ORAL | 0 refills | Status: DC
  Filled 2022-03-28: qty 30, 30d supply, fill #0

## 2022-03-28 MED ORDER — FLUOXETINE HCL 10 MG PO CAPS
10.0000 mg | ORAL_CAPSULE | Freq: Every day | ORAL | 0 refills | Status: DC
  Filled 2022-03-28: qty 30, 30d supply, fill #0

## 2022-03-28 MED ORDER — PRAVASTATIN SODIUM 40 MG PO TABS: 40.0000 mg | ORAL_TABLET | Freq: Every day | ORAL | Status: DC

## 2022-03-28 MED ORDER — CARVEDILOL 6.25 MG PO TABS
6.2500 mg | ORAL_TABLET | Freq: Two times a day (BID) | ORAL | 0 refills | Status: DC
  Filled 2022-03-28: qty 60, 30d supply, fill #0

## 2022-03-28 MED ORDER — TAMSULOSIN HCL 0.4 MG PO CAPS
0.4000 mg | ORAL_CAPSULE | Freq: Every day | ORAL | 0 refills | Status: DC
  Filled 2022-03-28: qty 30, 30d supply, fill #0

## 2022-03-28 NOTE — Discharge Summary (Signed)
Charles Estes [id number] DOB: Jul 15, 1960 DOA: 03/23/2022  PCP: Samara Deist, MD  Admit date: 03/23/2022  Discharge date: 03/28/2022  Admitted From: Home   Disposition:  [facility]/Shelter per SNF   Recommendations for Outpatient Follow-up:   Follow up with PCP in 1-2 weeks  PCP Please obtain BMP/CBC, 2 view CXR in 1week,  (see Discharge instructions)   PCP Please follow up on the following pending results: Recheck CBC, CMP and magnesium in 7 to 10 days, monitor blood pressure closely.   Home Health: None Equipment/Devices: None Consultations: Psychiatry, urology over the phone Discharge Condition: Stable    CODE STATUS: Full    Diet Recommendation: Heart Healthy     Chief Complaint  Patient presents with   Altered Mental Status     Brief history of present illness from the day of admission and additional interim summary    61 year old male with medical history of bipolar disorder, polysubstance abuse, including cocaine abuse, essential hypertension who was admitted to [facility] on 03/23/2022 with acute metabolic encephalopathy in setting of rhabdomyolysis, UDS was positive for cocaine.  He was dropped in the ER by unknown individual in a confused state, further work-up suggested rhabdomyolysis, toxic and metabolic encephalopathy along with acute renal failure.                                                                 Hospital Course    Toxic and metabolic encephalopathy caused by severe dehydration, cocaine and polysubstance abuse - last use on the 19th, says he takes cocaine once a month and plans not to do so again, no intentional drug overdose or suicidal ideation.  Currently mentation back to normal, head CT unremarkable.  No headache.  Counseled to quit all polysubstance abuse.  Clearly  counseled not to use cocaine with Coreg.   Rhabdomyolysis in conjunction with severe dehydration with hypotension, AKI - continue IV fluids, renal function has now shown improvement serially, blood pressure has stabilized, postvoid morning bladder scan is 40 cc, renal function still worsening, renal ultrasound was relatively stable this was discussed with urologist on-call Dr. Irine Seal on 03/24/2022 followed up with CT renal stone protocol which was unremarkable.  Renal function improving continue gentle hydration.  There was question of chronic pyelonephritis on CT however UA is bland and there is no dysuria or fever will monitor.  Much improved CK levels, serially improving creatinine, good urine output, stable for discharge requested to stay hydrated and follow-up with PCP in 7 days to get blood work rechecked.   Hypertension.  And extremely poor control.  Placed on combination of Norvasc, Coreg and hydralazine.  PCP to monitor and adjust.    Questionable Wellbutrin overdose.  Patient denies any overuse EKG was stable.  For his underlying depression he was seen by  psych and placed on combination of Prozac and Abilify.   Mild non-ACS pattern troponin elevation.  No chest pain likely due to cocaine abuse, chest pain-free, placed on aspirin, beta-blocker and statin.  Stable EKG and echocardiogram with preserved EF and no wall motion abnormality.   Mild asymptomatic transaminitis.  Due to rhabdomyolysis.  Total bilirubin is fine, he is now completely symptom-free, no abdominal pain or tenderness, right upper quadrant ultrasound stable.  Repeat CMP in 7 to 10 days by PCP.   Dyslipidemia.  On statin give a 7-day break as LFTs are up there after resume by PCP once LFTs have improved.     Note patient is homeless, after he was told that he will be discharged he is coming up with vague complaints every hour which include headache, chest pain, abdominal pain, he also reported some suicidal ideation to the  support staff on 03/26/2022.  Work-up and exam benign.  He now sees he never meant to commit suicide but just said that to the speech therapist yesterday, seen by psych and cleared for home discharge.  Will be discharged, social work to provide safe disposition.   Discharge diagnosis     Principal Problem:   Acute encephalopathy Active Problems:   AKI (acute kidney injury) ([place])   Rhabdomyolysis   SIRS (systemic inflammatory response syndrome) ([facility])   High anion gap metabolic acidosis   Elevated troponin   Transaminitis    Discharge instructions    Discharge Instructions     Diet - low sodium heart healthy   Complete by: As directed    Discharge instructions   Complete by: As directed    Special Instructions: If you have smoked or chewed Tobacco  in the last 2 yrs please stop smoking, stop any regular Alcohol  and or any Recreational drug use.  Do not do cocaine with the present medications you are being discharged, can cause sudden death.  Follow with Primary MD El-Khoury, Colan Neptune, MD in 7 days   Get CBC, CMP, 2 view Chest X ray -  checked next visit within 1 week by Primary MD    Activity: As tolerated with Full fall precautions use walker/cane & assistance as needed  Disposition Home     Diet: Heart Healthy    On your next visit with your primary care physician please Get Medicines reviewed and adjusted.  Please request your Prim.MD to go over all Hospital Tests and Procedure/Radiological results at the follow up, please get all Hospital records sent to your Prim MD by signing hospital release before you go home.  If you experience worsening of your admission symptoms, develop shortness of breath, life threatening emergency, suicidal or homicidal thoughts you must seek medical attention immediately by calling 911 or calling your MD immediately  if symptoms less severe.  You Must read complete instructions/literature along with all the possible adverse reactions/side  effects for all the Medicines you take and that have been prescribed to you. Take any new Medicines after you have completely understood and accpet all the possible adverse reactions/side effects.   Increase activity slowly   Complete by: As directed    No wound care   Complete by: As directed        Discharge Medications   Allergies as of 03/28/2022       Reactions   Paxil [paroxetine] Diarrhea, Other (See Comments)   Mouth sores        Medication List     STOP taking these medications  traZODone 100 MG tablet Commonly known as: DESYREL   Vitamin D (Ergocalciferol) 1.25 MG (50000 UNIT) Caps capsule Commonly known as: DRISDOL   WELLBUTRIN PO       TAKE these medications    acetaminophen 500 MG tablet Commonly known as: TYLENOL Take 500-1,000 mg by mouth daily as needed for mild pain or headache.   amLODipine 10 MG tablet Commonly known as: NORVASC Take 1 tablet (10 mg total) by mouth daily. Start taking on: March 29, 2022 What changed: Another medication with the same name was removed. Continue taking this medication, and follow the directions you see here.   ARIPiprazole 10 MG tablet Commonly known as: ABILIFY Take 1 tablet (10 mg total) by mouth daily.   aspirin EC 81 MG tablet Take 81 mg by mouth daily.   carvedilol 6.25 MG tablet Commonly known as: COREG Take 1 tablet (6.25 mg total) by mouth 2 (two) times daily with a meal.   FLUoxetine 10 MG capsule Commonly known as: PROZAC Take 1 capsule (10 mg total) by mouth daily.   hydrALAZINE 50 MG tablet Commonly known as: APRESOLINE Take 1 tablet (50 mg total) by mouth every 8 (eight) hours.   pantoprazole 40 MG tablet Commonly known as: PROTONIX Take 1 tablet (40 mg total) by mouth daily.   pravastatin 40 MG tablet Commonly known as: PRAVACHOL Take 1 tablet (40 mg total) by mouth daily. Start taking on: April 04, 2022 What changed: These instructions start on April 04, 2022. If you are  unsure what to do until then, ask your doctor or other care provider.   tamsulosin 0.4 MG Caps capsule Commonly known as: FLOMAX Take 1 capsule (0.4 mg total) by mouth daily. Start taking on: March 29, 2022         Follow-up [facility]. Schedule an appointment as soon as possible for a visit in 1 week(s).   Contact information: [place]  [phone]        Samara Deist, MD. Schedule an appointment as soon as possible for a visit in 1 week(s).   Specialty: Internal Medicine Contact information: [address] [place] [phone]                 Major procedures and Radiology Reports - PLEASE review detailed and final reports thoroughly  -       US Abdomen Limited RUQ (LIVER/GB)  Result Date: 03/27/2022 CLINICAL DATA:  Transaminitis EXAM: ULTRASOUND ABDOMEN LIMITED RIGHT UPPER QUADRANT COMPARISON:  03/24/2022 FINDINGS: Gallbladder: No gallstones or wall thickening visualized. No sonographic Murphy sign noted by sonographer. Common bile duct: Diameter: 4 mm Liver: No focal lesion identified. Within normal limits in parenchymal echogenicity. Portal vein is patent on color Doppler imaging with normal direction of blood flow towards the liver. Other: Small right pleural effusion. IMPRESSION: 1. Normal right upper quadrant ultrasound. Electronically Signed   By: Kathreen Devoid M.D.   On: 03/27/2022 15:51   DG Abd 1 View  Result Date: 03/27/2022 CLINICAL DATA:  Abdominal pain for several hours, initial encounter EXAM: ABDOMEN - 1 VIEW COMPARISON:  CT from 03/24/2022 FINDINGS: Scattered large and small bowel gas is noted. No obstructive changes are seen. No free air is noted. No bony abnormality is seen. IMPRESSION: No acute abnormality noted. Electronically Signed   By: Inez Catalina M.D.   On: 03/27/2022 01:46   ECHOCARDIOGRAM COMPLETE  Result Date:  03/24/2022    ECHOCARDIOGRAM REPORT   Patient Name:   CHIKE MAZZOTTI Date of Exam: 03/24/2022 Medical Rec #:  [id number]   Height:       66.0 in Accession #:    [id number]  Weight:       197.0 lb Date of Birth:  1961/05/01   BSA:          1.987 m Patient Age:    4 years    BP:           173/100 mmHg Patient Gender: M           HR:           94 bpm. Exam Location:  Inpatient Procedure: 2D Echo, Color Doppler and Cardiac Doppler Indications:    Elevated Troponins  History:        Patient has no prior history of Echocardiogram examinations.                 Risk Factors:Hypertension.  Sonographer:    Raquel Sarna Senior RDCS Referring Phys: [id number] Rhetta Mura  Sonographer Comments: No parasternal window due to lung interference. IMPRESSIONS  1. Left ventricular ejection fraction, by estimation, is 60 to 65%. The left ventricle has normal function. Left ventricular endocardial border not optimally defined to evaluate regional wall motion. Left ventricular diastolic parameters are consistent with Grade I diastolic dysfunction (impaired relaxation).  2. Right ventricular systolic function is normal. The right ventricular size is normal.  3. The mitral valve is normal in structure. No evidence of mitral valve regurgitation. No evidence of mitral stenosis.  4. The aortic valve is tricuspid. Aortic valve regurgitation is not visualized. No aortic stenosis is present.  5. The inferior vena cava is normal in size with greater than 50% respiratory variability, suggesting right atrial pressure of 3 mmHg. FINDINGS  Left Ventricle: Left ventricular ejection fraction, by estimation, is 60 to 65%. The left ventricle has normal function. Left ventricular endocardial border not optimally defined to evaluate regional wall motion. The left ventricular internal cavity size was normal in size. There is no left ventricular hypertrophy. Left ventricular diastolic parameters are consistent with Grade I diastolic dysfunction (impaired  relaxation). Normal left ventricular filling pressure. Right Ventricle: The right ventricular size is normal. Right vetricular wall thickness was not well visualized. Right ventricular systolic function is normal. Left Atrium: Left atrial size was normal in size. Right Atrium: Right atrial size was normal in size. Pericardium: There is no evidence of pericardial effusion. Mitral Valve: The mitral valve is normal in structure. No evidence of mitral valve regurgitation. No evidence of mitral valve stenosis. Tricuspid Valve: The tricuspid valve is normal in structure. Tricuspid valve regurgitation is not demonstrated. No evidence of tricuspid stenosis. Aortic Valve: The aortic valve is tricuspid. Aortic valve regurgitation is not visualized. No aortic stenosis is present. Aortic valve mean gradient measures 3.3 mmHg. Aortic valve peak gradient measures 8.3 mmHg. Aortic valve area, by VTI measures 2.02 cm. Pulmonic Valve: The pulmonic valve was not well visualized. Pulmonic valve regurgitation is not visualized. No evidence of pulmonic stenosis. Aorta: The aortic root was not well visualized. Venous: The inferior vena cava is normal in size with greater than 50% respiratory variability, suggesting right atrial pressure of 3 mmHg. IAS/Shunts: No atrial level shunt detected by color flow Doppler.  LEFT VENTRICLE PLAX 2D LVOT diam:     2.00 cm   Diastology LV SV:         48  LV e' medial:    5.77 cm/s LV SV Index:   24        LV E/e' medial:  8.4 LVOT Area:     3.14 cm  LV e' lateral:   11.10 cm/s                          LV E/e' lateral: 4.4  RIGHT VENTRICLE RV S prime:     14.50 cm/s TAPSE (M-mode): 2.3 cm LEFT ATRIUM             Index        RIGHT ATRIUM           Index LA Vol (A2C):   43.8 ml 22.04 ml/m  RA Area:     14.80 cm LA Vol (A4C):   55.1 ml 27.73 ml/m  RA Volume:   33.30 ml  16.76 ml/m LA Biplane Vol: 53.6 ml 26.98 ml/m  AORTIC VALVE AV Area (Vmax):    2.24 cm AV Area (Vmean):   2.91 cm AV Area  (VTI):     2.02 cm AV Vmax:           144.38 cm/s AV Vmean:          81.393 cm/s AV VTI:            0.238 m AV Peak Grad:      8.3 mmHg AV Mean Grad:      3.3 mmHg LVOT Vmax:         103.00 cm/s LVOT Vmean:        75.500 cm/s LVOT VTI:          0.153 m LVOT/AV VTI ratio: 0.64 MITRAL VALVE MV Area (PHT): 3.65 cm    SHUNTS MV Decel Time: 208 msec    Systemic VTI:  0.15 m MV E velocity: 48.60 cm/s  Systemic Diam: 2.00 cm MV A velocity: 89.70 cm/s MV E/A ratio:  0.54 Carlyle Dolly MD Electronically signed by Carlyle Dolly MD Signature Date/Time: 03/24/2022/2:55:08 PM    Final    CT RENAL STONE STUDY  Result Date: 03/24/2022 CLINICAL DATA:  Renal stones, hydronephrosis EXAM: CT ABDOMEN AND PELVIS WITHOUT CONTRAST TECHNIQUE: Multidetector CT imaging of the abdomen and pelvis was performed following the standard protocol without IV contrast. RADIATION DOSE REDUCTION: This exam was performed according to the departmental dose-optimization program which includes automated exposure control, adjustment of the mA and/or kV according to patient size and/or use of iterative reconstruction technique. COMPARISON:  Renal sonogram done on 03/24/2022 FINDINGS: Lower chest: Coronary artery calcifications are seen. Small linear densities are seen in lower lung fields suggesting scarring or subsegmental atelectasis. Hepatobiliary: No focal abnormalities are seen in liver. Gallbladder is not distended. Pancreas: Unremarkable. Spleen: There are small calcified granulomas in spleen. Spleen is not enlarged. Adrenals/Urinary Tract: Adrenals are unremarkable. There is no hydronephrosis. There are no demonstrable renal or ureteral stones. Mild left hydronephrosis and possible left renal calculus seen in the previous sonogram could not be identified in the CT images. There is perinephric stranding around both kidneys. Urinary bladder is unremarkable. Stomach/Bowel: Small hiatal hernia is seen. Small bowel loops are not dilated.  Appendix is unremarkable. There is no significant wall thickening in colon. There is no pericolic stranding. Vascular/Lymphatic: Scattered arterial calcifications are seen. Reproductive: There are few coarse calcifications in prostate. Other: There is no ascites or pneumoperitoneum. Umbilical hernia containing fat is seen. Inguinal hernias containing fat are noted. Musculoskeletal:  There is minimal anterolisthesis at L4-L5 level. Degenerative changes are noted in facet joints in lower lumbar spine. IMPRESSION: There is no evidence of intestinal obstruction or pneumoperitoneum. There is no hydronephrosis. Appendix is not dilated. There is perinephric stranding around both kidneys. This may be residual from previous ureteric obstruction or suggest acute or chronic pyelonephritis. There are no demonstrable opaque renal or ureteral stones. Arteriosclerosis.  Coronary artery disease.  Small hiatal hernia. Other findings as described in the body of the report. Electronically Signed   By: Elmer Picker M.D.   On: 03/24/2022 13:34   US RENAL  Result Date: 03/24/2022 CLINICAL DATA:  Acute kidney injury EXAM: RENAL / URINARY TRACT ULTRASOUND COMPLETE COMPARISON:  CT 01/22/2022 FINDINGS: Right Kidney: Renal measurements: 11.4 x 5.3 x 5.4 cm = volume: 172 mL. Echogenicity within normal limits. No mass, shadowing stone, or hydronephrosis visualized. Left Kidney: Renal measurements: 2 11.1 x 7.2 x 5.8 cm = volume: 241 mL. Echogenicity within normal limits. 4 mm stone within the interpolar region of the left kidney. Mild hydronephrosis. No mass is visualized. Bladder: Appears normal for degree of bladder distention. Other: None. IMPRESSION: 1. Mild left hydronephrosis. 4 mm stone within the interpolar region of the left kidney. 2. Unremarkable right kidney. Electronically Signed   By: Davina Poke D.O.   On: 03/24/2022 11:21   DG Chest Port 1 View  Result Date: 03/24/2022 CLINICAL DATA:  61 year old male with  history of shortness of breath. EXAM: PORTABLE CHEST 1 VIEW COMPARISON:  Chest x-ray 03/23/2020. FINDINGS: Lung volumes are normal. No consolidative airspace disease. No pleural effusions. No pneumothorax. No pulmonary nodule or mass noted. Pulmonary vasculature and the cardiomediastinal silhouette are within normal limits. Atherosclerotic calcifications are noted in the thoracic aorta. IMPRESSION: 1.  No radiographic evidence of acute cardiopulmonary disease. 2. Aortic atherosclerosis. Electronically Signed   By: Vinnie Langton M.D.   On: 03/24/2022 06:10   CT HEAD WO CONTRAST (5MM)  Result Date: 03/23/2022 CLINICAL DATA:  [place] and hit head. EXAM: CT HEAD WITHOUT CONTRAST TECHNIQUE: Contiguous axial images were obtained from the base of the skull through the vertex without intravenous contrast. RADIATION DOSE REDUCTION: This exam was performed according to the departmental dose-optimization program which includes automated exposure control, adjustment of the mA and/or kV according to patient size and/or use of iterative reconstruction technique. COMPARISON:  None Available. FINDINGS: Brain: No evidence of acute infarction, hemorrhage, hydrocephalus, extra-axial collection or mass lesion/mass effect. There are mildly prominent perivascular spaces in the anterior basal ganglia. Mild cerebral cortical atrophy with minimal small vessel disease in the deep white matter. Vascular: There are scattered calcifications in both siphons. No hyperdense central vasculature. Skull: There is no visible scalp hematoma. No fracture or focal skull lesion is seen. Sinuses/Orbits: There are chronic depressed fractures of both orbital floors. The orbital contents are unremarkable. Visible sinuses and mastoid air cells are clear. Other: None. IMPRESSION: 1. No acute intracranial CT findings. 2. No depressed skull fractures. 3. Chronic depressed fractures of both orbital floors. Electronically Signed   By: Telford Nab M.D.   On:  03/23/2022 03:56   DG Elbow Complete Left  Result Date: 03/23/2022 CLINICAL DATA:  Pain after fall EXAM: LEFT ELBOW - COMPLETE 3+ VIEW COMPARISON:  None Available. FINDINGS: There is no evidence of fracture, dislocation, or joint effusion. There is no evidence of arthropathy or other focal bone abnormality. Soft tissues are unremarkable. IMPRESSION: No acute fracture or dislocation. Electronically Signed   By: Dorothea Ogle  Stutzman M.D.   On: 03/23/2022 02:08   DG Wrist Complete Left  Result Date: 03/23/2022 CLINICAL DATA:  Pain after fall EXAM: LEFT WRIST - COMPLETE 3+ VIEW COMPARISON:  None Available. FINDINGS: There is no evidence of fracture or dislocation. Widening of the scapholunate interval. Degenerative arthritis first CMC joint. Soft tissues are unremarkable. IMPRESSION: No acute fracture or dislocation. Electronically Signed   By: Placido Sou M.D.   On: 03/23/2022 02:08   DG Chest Port 1 View  Result Date: 03/23/2022 CLINICAL DATA:  Questionable sepsis. EXAM: PORTABLE CHEST 1 VIEW COMPARISON:  Chest radiograph dated 01/22/2022. FINDINGS: No focal consolidation, pleural effusion, pneumothorax. The cardiac silhouette is within normal limits. No acute osseous pathology. IMPRESSION: No active disease. Electronically Signed   By: Anner Crete M.D.   On: 03/23/2022 01:05       Today   Subjective    Charles Estes today has no headache,no chest abdominal pain,no new weakness tingling or numbness, feels much better .     Objective   Blood pressure  140/90, pulse 86, temperature 98.8 F (37.1 C), temperature source Oral, resp. rate 16, weight 94.7 kg, SpO2 95 %.   Intake/Output Summary (Last 24 hours) at 03/28/2022 0941 Last data filed at 03/28/2022 0558 Gross per 24 hour  Intake 1366.43 ml  Output 1625 ml  Net -258.57 ml    Exam  Awake Alert, No new F.N deficits,    [place].AT,PERRAL Supple Neck,   Symmetrical Chest wall movement, Good air movement bilaterally, CTAB RRR,No  Gallops,   +ve B.Sounds, Abd Soft, Non tender,  No Cyanosis, Clubbing or edema    Data Review   Recent Labs  Lab 03/23/22 1820 03/24/22 0553 03/25/22 0439 03/26/22 0623 03/28/22 0537  WBC 10.8* 10.1 9.1 7.2 6.4  HGB 13.4 12.3* 11.2* 10.8* 10.8*  HCT 39.0 35.6* 31.8* 31.1* 32.1*  PLT 133* 143* 144* 166 180  MCV 91.5 91.3 91.4 91.7 93.9  MCH 31.5 31.5 32.2 31.9 31.6  MCHC 34.4 34.6 35.2 34.7 33.6  RDW 13.2 13.3 13.3 13.2 13.1  LYMPHSABS 1.3 1.3 1.7 1.6 1.7  MONOABS 1.0 0.9 0.6 0.5 0.6  EOSABS 0.1 0.1 0.1 0.1 0.2  BASOSABS 0.0 0.0 0.1 0.0 0.0    Recent Labs  Lab 03/23/22 0035 03/23/22 0103 03/23/22 0200 03/23/22 0230 03/23/22 0441 03/23/22 0600 03/23/22 0930 03/24/22 0332 03/24/22 0553 03/25/22 0439 03/26/22 0623 03/27/22 0638 03/28/22 0540  NA 136   < > 135   < >  --  137  --  140  --  140 141 140 140  K 3.8   < > 3.7   < >  --  3.8  --  3.6  --  3.4* 3.9 4.4 4.3  CL 97*   < > 102  --   --  100  --  106  --  107 106 107 104  CO2 21*  --  20*  --   --  21*  --  21*  --  23 24 27 27   GLUCOSE 131*   < > 95  --   --  145*  --  120*  --  102* 106* 105* 98  BUN 33*   < > 33*  --   --  34*  --  39*  --  38* 38* 30* 24*  CREATININE 4.03*   < > 3.78*  --   --  3.69*  --  5.16*  --  4.76* 3.83* 3.08* 2.57*  CALCIUM 9.3  --  8.6*  --   --  7.9*  --  8.5*  --  8.5* 8.6* 8.6* 8.7*  AST 404*  --  394*  --   --  259*  --  238*  --  269* 213* 221* 256*  ALT 138*  --  136*  --   --  110*  --  99*  --  100* 95* 134* 198*  ALKPHOS 75  --  72  --   --  57  --  54  --  50 49 73 67  BILITOT 0.6  --  0.6  --   --  0.4  --  0.5  --  0.9 0.8 0.7 1.0  ALBUMIN 3.8  --  3.7  --   --  2.5*  --  2.3*  --  2.4* 2.5* 2.7* 2.7*  MG  --   --   --   --   --  2.0  --   --  1.8 1.6* 2.2  --   --   PHOS  --   --   --   --   --  4.5  --   --   --   --   --   --   --   CRP  --   --   --   --   --   --   --   --  7.1*  --   --   --   --   LATICACIDVEN  --   --  2.6*  --  1.8  --   --   --   --   --    --   --   --   INR 1.1  --   --   --   --   --  1.3*  --   --   --   --   --   --   TSH  --   --   --   --   --   --  1.006  --   --   --   --   --   --   AMMONIA  --   --   --   --  16  --   --   --   --   --   --   --   --   BNP  --   --   --   --   --   --   --   --  126.1* 81.4 190.7*  --   --    < > = values in this interval not displayed.    Total Time in preparing paper work, data evaluation and todays exam - 25 minutes  Lala Lund M.D on 03/28/2022 at 9:41 AM  [facility]

## 2022-03-28 NOTE — Discharge Instructions (Signed)
Special Instructions: If you have smoked or chewed Tobacco  in the last 2 yrs please stop smoking, stop any regular Alcohol  and or any Recreational drug use.  Do not do cocaine with the present medications you are being discharged, can cause sudden death.  Follow with Primary MD El-Khoury, Colan Neptune, MD in 7 days   Get CBC, CMP, 2 view Chest X ray -  checked next visit within 1 week by Primary MD    Activity: As tolerated with Full fall precautions use walker/cane & assistance as needed  Disposition Home     Diet: Heart Healthy    On your next visit with your primary care physician please Get Medicines reviewed and adjusted.  Please request your Prim.MD to go over all Hospital Tests and Procedure/Radiological results at the follow up, please get all Hospital records sent to your Prim MD by signing hospital release before you go home.  If you experience worsening of your admission symptoms, develop shortness of breath, life threatening emergency, suicidal or homicidal thoughts you must seek medical attention immediately by calling 911 or calling your MD immediately  if symptoms less severe.  You Must read complete instructions/literature along with all the possible adverse reactions/side effects for all the Medicines you take and that have been prescribed to you. Take any new Medicines after you have completely understood and accpet all the possible adverse reactions/side effects.

## 2022-03-28 NOTE — TOC Transition Note (Signed)
Transition of Care [facility]) - CM/SW Discharge Note   Patient Details  Name: Charles Estes MRN: [id number] Date of Birth: 1961/03/27  [facility] CM/SW Contact:  [name], RN Phone Number: 03/28/2022, 11:05 AM   Clinical Narrative:    Pt is discharging back to his [place]. He only called one shelter for housing post discharge. Pt has a friend that will meet him at his [place]. CM provided cab voucher to get his to the location of his [place]. Pts friend unable to pick him up from hospital. Medications to be delivered to the room per [place].   Final next level of care: Other (comment) (car) Barriers to Discharge: No Barriers Identified   Patient Goals and CMS Choice   CMS Medicare.gov Compare Post Acute Care list provided to:: Patient Choice offered to / list presented to : Patient  Discharge Placement                       Discharge Plan and Services   Discharge Planning Services: CM Consult                                 Social Determinants of Health (SDOH) Interventions     Readmission Risk Interventions     No data to display

## 2022-03-28 NOTE — Plan of Care (Signed)
  Problem: Acute Rehab OT Goals (only OT should resolve) Goal: Pt. Will Perform Grooming Outcome: Adequate for Discharge Goal: Pt. Will Perform Lower Body Dressing Outcome: Adequate for Discharge Goal: Pt. Will Transfer To Toilet Outcome: Adequate for Discharge Goal: Pt. Will Perform Toileting-Clothing Manipulation Outcome: Adequate for Discharge Goal: OT Additional ADL Goal #1 Outcome: Adequate for Discharge   Problem: Acute Rehab PT Goals(only PT should resolve) Goal: Pt Will Go Supine/Side To Sit Outcome: Adequate for Discharge Goal: Pt Will Go Sit To Supine/Side Outcome: Adequate for Discharge Goal: Patient Will Transfer Sit To/From Stand Outcome: Adequate for Discharge Goal: Pt Will Ambulate Outcome: Adequate for Discharge Goal: Pt Will Go Up/Down Stairs Outcome: Adequate for Discharge   Problem: SLP Cognition Goals Goal: Patient will utilize external memory aids Description: Patient will utilize external memory aids to facilitate recall of information for improved safety with Outcome: Adequate for Discharge

## 2022-03-28 NOTE — Plan of Care (Signed)

## 2022-03-28 NOTE — Progress Notes (Signed)
This RN told pt he was being discharged. While pt ambulating to bathroom with RN assistance, pt stating [place] "because I'm worth more dead than alive". Lala Lund, MD and France Ravens, MD (psychiatry) aware. Okay to proceed with discharge per Lala Lund, MD.

## 2022-03-28 NOTE — Progress Notes (Signed)
Charles Estes, SWOT RN to pick up pt meds from [facility].

## 2022-03-28 NOTE — Progress Notes (Signed)
Discharge instructions (including medications) discussed with and copy provided to patient/caregiver 

## 2022-07-12 ENCOUNTER — Other Ambulatory Visit: Payer: Self-pay

## 2022-07-12 ENCOUNTER — Emergency Department (HOSPITAL_COMMUNITY)
Admission: EM | Admit: 2022-07-12 | Discharge: 2022-07-12 | Disposition: A | Discharge status: Home / Self Care | Payer: 59 | Attending: Emergency Medicine | Admitting: Emergency Medicine

## 2022-07-12 ENCOUNTER — Emergency Department (HOSPITAL_COMMUNITY): Payer: 59

## 2022-07-12 DIAGNOSIS — F17210 Nicotine dependence, cigarettes, uncomplicated: Secondary | ICD-10-CM | POA: Diagnosis not present

## 2022-07-12 DIAGNOSIS — M546 Pain in thoracic spine: Secondary | ICD-10-CM | POA: Insufficient documentation

## 2022-07-12 DIAGNOSIS — Z79899 Other long term (current) drug therapy: Secondary | ICD-10-CM | POA: Diagnosis not present

## 2022-07-12 DIAGNOSIS — R079 Chest pain, unspecified: Secondary | ICD-10-CM

## 2022-07-12 DIAGNOSIS — R0789 Other chest pain: Secondary | ICD-10-CM | POA: Diagnosis present

## 2022-07-12 DIAGNOSIS — I10 Essential (primary) hypertension: Secondary | ICD-10-CM | POA: Insufficient documentation

## 2022-07-12 DIAGNOSIS — R42 Dizziness and giddiness: Secondary | ICD-10-CM | POA: Diagnosis not present

## 2022-07-12 LAB — COMPREHENSIVE METABOLIC PANEL
ALT: 18 U/L (ref 0–44)
AST: 27 U/L (ref 15–41)
Albumin: 3.9 g/dL (ref 3.5–5.0)
Alkaline Phosphatase: 77 U/L (ref 38–126)
Anion gap: 11 (ref 5–15)
BUN: 16 mg/dL (ref 6–20)
CO2: 23 mmol/L (ref 22–32)
Calcium: 9.1 mg/dL (ref 8.9–10.3)
Chloride: 102 mmol/L (ref 98–111)
Creatinine, Ser: 1.35 mg/dL — ABNORMAL HIGH (ref 0.61–1.24)
GFR, Estimated: >60 mL/min (ref >60)
Glucose, Bld: 114 mg/dL — ABNORMAL HIGH (ref 70–99)
Potassium: 3.2 mmol/L — ABNORMAL LOW (ref 3.5–5.1)
Sodium: 136 mmol/L (ref 135–145)
Total Bilirubin: 0.8 mg/dL (ref 0.3–1.2)
Total Protein: 7.2 g/dL (ref 6.5–8.1)

## 2022-07-12 LAB — CBC
HCT: 38.1 % — ABNORMAL LOW (ref 39.0–52.0)
Hemoglobin: 12.9 g/dL — ABNORMAL LOW (ref 13.0–17.0)
MCH: 31.9 pg (ref 26.0–34.0)
MCHC: 33.9 g/dL (ref 30.0–36.0)
MCV: 94.3 fL (ref 80.0–100.0)
Platelets: 291 10*3/uL (ref 150–400)
RBC: 4.04 MIL/uL — ABNORMAL LOW (ref 4.22–5.81)
RDW: 13.8 % (ref 11.5–15.5)
WBC: 9 10*3/uL (ref 4.0–10.5)
nRBC: 0 % (ref 0.0–0.2)

## 2022-07-12 LAB — ETHANOL: Alcohol, Ethyl (B): <10 mg/dL (ref <10)

## 2022-07-12 LAB — TROPONIN I (HIGH SENSITIVITY)
Troponin I (High Sensitivity): 5 ng/L (ref <18)
Troponin I (High Sensitivity): 4 ng/L (ref <18)

## 2022-07-12 NOTE — Discharge Instructions (Signed)
You were evaluated in the Emergency Department and after careful evaluation, we did not find any emergent condition requiring admission or further testing in the hospital.  Your exam/testing today is overall reassuring.  Recommend follow-up with your cardiologist to discuss her symptoms.  Please return to the Emergency Department if you experience any worsening of your condition.   Thank you for allowing [place] to be a part of your care.

## 2022-07-12 NOTE — ED Notes (Signed)
New troponin sample sent to lab.

## 2022-07-12 NOTE — ED Notes (Signed)
Redraw of INR sent to lab

## 2022-07-12 NOTE — ED Notes (Signed)
Called lab, they state the did not receive 2nd troponin level collected at 0536.

## 2022-07-12 NOTE — ED Provider Notes (Signed)
[facility] Emergency Department Provider Note MRN:  [id number]  Arrival date & time: 07/12/22     Chief Complaint   Chest Pain   History of Present Illness   Charles Estes is a 62 y.o. year-old male with a history of depression, hypertension, substance use disorder presenting to the ED with chief complaint of chest pain.  Pain on the left side of the chest, worse when laying flat, described as a pressure, present for about 12 hours.  Does not feel like acid reflux, feels different.  Associated with some dizziness on occasion.  Pain in the thoracic back as well.  Review of Systems  A thorough review of systems was obtained and all systems are negative except as noted in the HPI and PMH.   Patient's Health History    Past Medical History:  Diagnosis Date   Depression    GERD (gastroesophageal reflux disease)    Hypertension    Pathologic ulnar fracture with malunion    right    Past Surgical History:  Procedure Laterality Date   MANDIBLE FRACTURE SURGERY  2014   ORIF ULNAR FRACTURE Right 07/16/2017   Procedure: OPEN REDUCTION INTERNAL FIXATION (ORIF) RIGHT ULNA FRACTURE NONUNION;  Surgeon: Leandrew Koyanagi, MD;  Location: [facility];  Service: Orthopedics;  Laterality: Right;    Family History  Problem Relation Age of Onset   Hypertension Mother    Heart attack Mother    Hypertension Father    Heart attack Father    Heart attack Sister    Hypertension Sister    Heart attack Sister    Hypertension Brother     Social History   Socioeconomic History   Marital status: Single    Spouse name: Not on file   Number of children: Not on file   Years of education: Not on file   Highest education level: Not on file  Occupational History   Not on file  Tobacco Use   Smoking status: Some Days    Types: Cigarettes   Smokeless tobacco: Never  Substance and Sexual Activity   Alcohol use: Yes    Comment: socially- past weekend reports 10  beers   Drug use: Yes    Types: Cocaine    Comment: occasionally   Sexual activity: Not on file  Other Topics Concern   Not on file  Social History Narrative   Not on file   Social Determinants of Health   Financial Resource Strain: Not on file  Food Insecurity: Not on file  Transportation Needs: Not on file  Physical Activity: Not on file  Stress: Not on file  Social Connections: Not on file  Intimate Partner Violence: Not on file     Physical Exam   Vitals:   07/12/22 0415 07/12/22 0430  BP: 123/82 113/82  Pulse: 72 71  Resp:    Temp:    SpO2: 100% 100%    CONSTITUTIONAL: Well-appearing, NAD NEURO/PSYCH:  Alert and oriented x 3, no focal deficits EYES:  eyes equal and reactive ENT/NECK:  no LAD, no JVD CARDIO: Regular rate, well-perfused, normal S1 and S2 PULM:  CTAB no wheezing or rhonchi GI/GU:  non-distended, non-tender MSK/SPINE:  No gross deformities, no edema SKIN:  no rash, atraumatic   *Additional and/or pertinent findings included in MDM below  Diagnostic and Interventional Summary    EKG Interpretation  Date/Time:  Friday July 12 2022 03:27:29 EST Ventricular Rate:  72 PR Interval:  156 QRS Duration: 99  QT Interval:  411 QTC Calculation: 450 R Axis:   4 Text Interpretation: Sinus rhythm Confirmed by Gerlene Fee [phone]) on 07/12/2022 3:32:35 AM       Labs Reviewed  CBC - Abnormal; Notable for the following components:      Result Value   RBC 4.04 (*)    Hemoglobin 12.9 (*)    HCT 38.1 (*)    All other components within normal limits  COMPREHENSIVE METABOLIC PANEL - Abnormal; Notable for the following components:   Potassium 3.2 (*)    Glucose, Bld 114 (*)    Creatinine, Ser 1.35 (*)    All other components within normal limits  ETHANOL  TROPONIN I (HIGH SENSITIVITY)  TROPONIN I (HIGH SENSITIVITY)    DG Chest Port 1 View  Final Result      Medications - No data to display   Procedures  /  Critical Care Procedures  ED  Course and Medical Decision Making  Initial Impression and Ddx No acute distress with normal vital signs, admits to using cocaine intermittently for years and years, last use was yesterday.  Differential diagnosis includes ACS, doubt PE.  Dissection considered but felt to be less likely.  Awaiting labs, chest x-ray.  Past medical/surgical history that increases complexity of ED encounter: Substance use disorder  Interpretation of Diagnostics I personally reviewed the EKG and my interpretation is as follows: Sinus rhythm without concerning ischemic features  Labs reassuring with no significant blood count or electrolyte disturbance, chest x-ray normal  Patient Reassessment and Ultimate Disposition/Management     Awaiting second troponin, patient resting comfortably on reassessment, anticipating discharge if troponin is negative.  Signed out to oncoming provider.  Patient management required discussion with the following services or consulting groups:  None  Complexity of Problems Addressed Acute illness or injury that poses threat of life of bodily function  Additional Data Reviewed and Analyzed Further history obtained from: Prior labs/imaging results  Additional Factors Impacting ED Encounter Risk None  Barth Kirks. Sedonia Small, [place] [email or web address]  Final Clinical Impressions(s) / ED Diagnoses     ICD-10-CM   1. Chest pain, unspecified type  R07.9       ED Discharge Orders     None        Discharge Instructions Discussed with and Provided to Patient:     Discharge Instructions      You were evaluated in the Emergency Department and after careful evaluation, we did not find any emergent condition requiring admission or further testing in the hospital.  Your exam/testing today is overall reassuring.  Recommend follow-up with your cardiologist to discuss her symptoms.  Please return to the Emergency Department  if you experience any worsening of your condition.   Thank you for allowing [place] to be a part of your care.       Maudie Flakes, MD 07/12/22 [phone]

## 2022-07-12 NOTE — ED Provider Notes (Signed)
Blood pressure 116/78, pulse 63, temperature 98.2 F (36.8 C), temperature source Oral, resp. rate 15, SpO2 100 %.  Assuming care from Dr. Sedonia Small.  In short, Charles Estes is a 62 y.o. male with a chief complaint of Chest Pain .  Refer to the original H&P for additional details.  The current plan of care is to follow up on repeat troponin.    EKG Interpretation  Date/Time:  Friday July 12 2022 03:27:29 EST Ventricular Rate:  72 PR Interval:  156 QRS Duration: 99 QT Interval:  411 QTC Calculation: 450 R Axis:   4 Text Interpretation: Sinus rhythm Confirmed by Gerlene Fee [phone]) on 07/12/2022 3:32:35 AM       Second troponin negative.  All doing well.  No chest pain.  Appears stable for discharge.     Margette Fast, MD 07/12/22 [phone]

## 2022-07-12 NOTE — ED Notes (Signed)
Pt agitated and anxious, BP elevated 111/100, pt states usually takes OD HTN meds. States last dose 4 days ago.

## 2022-07-12 NOTE — ED Triage Notes (Signed)
Patient reports left chest pain radiating to back with SOB and multiple emesis this evening , denies diaphoresis or cough .

## 2022-07-23 DIAGNOSIS — Z87898 Personal history of other specified conditions: Secondary | ICD-10-CM | POA: Insufficient documentation

## 2022-07-23 DIAGNOSIS — Z789 Other specified health status: Secondary | ICD-10-CM | POA: Insufficient documentation

## 2022-07-23 DIAGNOSIS — G894 Chronic pain syndrome: Secondary | ICD-10-CM | POA: Insufficient documentation

## 2022-07-23 DIAGNOSIS — Z79899 Other long term (current) drug therapy: Secondary | ICD-10-CM | POA: Insufficient documentation

## 2022-07-23 DIAGNOSIS — M899 Disorder of bone, unspecified: Secondary | ICD-10-CM | POA: Insufficient documentation
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
## 2022-07-24 ENCOUNTER — Ambulatory Visit
Admission: RE | Admit: 2022-07-24 | Discharge: 2022-07-24 | Disposition: A | Discharge status: Home / Self Care | Payer: No Typology Code available for payment source | Source: Ambulatory Visit | Attending: Pain Medicine | Admitting: Pain Medicine

## 2022-07-24 ENCOUNTER — Encounter: Payer: Self-pay | Admitting: Pain Medicine

## 2022-07-24 ENCOUNTER — Ambulatory Visit: Payer: Worker's Compensation | Attending: Pain Medicine | Admitting: Pain Medicine

## 2022-07-24 VITALS — BP 159/115 | HR 75 | Temp 97.3°F | Ht 66.0 in | Wt 200.0 lb

## 2022-07-24 DIAGNOSIS — Z789 Other specified health status: Secondary | ICD-10-CM

## 2022-07-24 DIAGNOSIS — M4310 Spondylolisthesis, site unspecified: Secondary | ICD-10-CM | POA: Insufficient documentation

## 2022-07-24 DIAGNOSIS — Z87898 Personal history of other specified conditions: Secondary | ICD-10-CM

## 2022-07-24 DIAGNOSIS — M5442 Lumbago with sciatica, left side: Secondary | ICD-10-CM | POA: Diagnosis not present

## 2022-07-24 DIAGNOSIS — M533 Sacrococcygeal disorders, not elsewhere classified: Secondary | ICD-10-CM | POA: Diagnosis present

## 2022-07-24 DIAGNOSIS — M5416 Radiculopathy, lumbar region: Secondary | ICD-10-CM

## 2022-07-24 DIAGNOSIS — M5410 Radiculopathy, site unspecified: Secondary | ICD-10-CM | POA: Diagnosis present

## 2022-07-24 DIAGNOSIS — M25551 Pain in right hip: Secondary | ICD-10-CM | POA: Diagnosis present

## 2022-07-24 DIAGNOSIS — G894 Chronic pain syndrome: Secondary | ICD-10-CM

## 2022-07-24 DIAGNOSIS — M79605 Pain in left leg: Secondary | ICD-10-CM | POA: Insufficient documentation

## 2022-07-24 DIAGNOSIS — G8929 Other chronic pain: Secondary | ICD-10-CM | POA: Insufficient documentation

## 2022-07-24 DIAGNOSIS — M5459 Other low back pain: Secondary | ICD-10-CM | POA: Insufficient documentation

## 2022-07-24 DIAGNOSIS — M5136 Other intervertebral disc degeneration, lumbar region: Secondary | ICD-10-CM | POA: Insufficient documentation

## 2022-07-24 DIAGNOSIS — M47817 Spondylosis without myelopathy or radiculopathy, lumbosacral region: Secondary | ICD-10-CM

## 2022-07-24 DIAGNOSIS — M4316 Spondylolisthesis, lumbar region: Secondary | ICD-10-CM | POA: Insufficient documentation

## 2022-07-24 DIAGNOSIS — M25552 Pain in left hip: Secondary | ICD-10-CM

## 2022-07-24 DIAGNOSIS — Z79899 Other long term (current) drug therapy: Secondary | ICD-10-CM

## 2022-07-24 DIAGNOSIS — R937 Abnormal findings on diagnostic imaging of other parts of musculoskeletal system: Secondary | ICD-10-CM | POA: Diagnosis not present

## 2022-07-24 DIAGNOSIS — M51369 Other intervertebral disc degeneration, lumbar region without mention of lumbar back pain or lower extremity pain: Secondary | ICD-10-CM

## 2022-07-24 DIAGNOSIS — M899 Disorder of bone, unspecified: Secondary | ICD-10-CM

## 2022-07-24 DIAGNOSIS — I169 Hypertensive crisis, unspecified: Secondary | ICD-10-CM

## 2022-07-24 NOTE — Patient Instructions (Addendum)
____________________________________________________________________________________________  New Patients  Welcome to [facility] [place].   Initial Visit The first or initial visit consists of an evaluation only.   Interventional pain management.  We offer therapies other than opioid controlled substances to manage chronic pain. These include, but are not limited to, diagnostic, therapeutic, and palliative specialized injection therapies (i.e.: Epidural Steroids, Facet Blocks, etc.). We specialize in a variety of nerve blocks as well as radiofrequency treatments. We offer pain implant evaluations and trials, as well as follow up management. In addition we also provide a variety joint injections, including Viscosupplementation (AKA: Gel Therapy).  Prescription Pain Medication. We specialize in alternatives to opioids. We can provide evaluations and recommendations for/of pharmacologic therapies based on CDC Guidelines.  We no longer take patients for long-term medication management. We will not be taking over your pain medications.  ____________________________________________________________________________________________    ____________________________________________________________________________________________  Patient Information update  To: All of our patients.  Re: Name change.  It has been made official that our current name, "[facility]"   will soon be changed to "[facility]".   The purpose of this change is to eliminate any confusion created by the concept of our practice being a "Medication Management Pain Clinic". In the past this has led to the misconception that we treat pain primarily by the use of prescription medications.  Nothing can be farther from the truth.   Understanding PAIN MANAGEMENT: To  further understand what our practice does, you first have to understand that "Pain Management" is a subspecialty that requires additional training once a physician has completed their specialty training, which can be in either Anesthesia, Neurology, Psychiatry, or Physical Medicine and Rehabilitation (PMR). Each one of these contributes to the final approach taken by each physician to the management of their patient's pain. To be a "Pain Management Specialist" you must have first completed one of the specialty trainings below.  Anesthesiologists - trained in clinical pharmacology and interventional techniques such as nerve blockade and regional as well as central neuroanatomy. They are trained to block pain before, during, and after surgical interventions.  Neurologists - trained in the diagnosis and pharmacological treatment of complex neurological conditions, such as Multiple Sclerosis, Parkinson's, spinal cord injuries, and other systemic conditions that may be associated with symptoms that may include but are not limited to pain. They tend to rely primarily on the treatment of chronic pain using prescription medications.  Psychiatrist - trained in conditions affecting the psychosocial wellbeing of patients including but not limited to depression, anxiety, schizophrenia, personality disorders, addiction, and other substance use disorders that may be associated with chronic pain. They tend to rely primarily on the treatment of chronic pain using prescription medications.   Physical Medicine and Rehabilitation (PMR) physicians, also known as physiatrists - trained to treat a wide variety of medical conditions affecting the brain, spinal cord, nerves, bones, joints, ligaments, muscles, and tendons. Their training is primarily aimed at treating patients that have suffered injuries that have caused severe physical impairment. Their training is primarily aimed at the physical therapy and rehabilitation of those  patients. They may also work alongside orthopedic surgeons or neurosurgeons using their expertise in assisting surgical patients to recover after their surgeries.  INTERVENTIONAL PAIN MANAGEMENT is sub-subspecialty of Pain Management.  Our physicians are Board-certified in Anesthesia, Pain Management, and Interventional Pain Management.  This meaning that not only have they been trained  and Board-certified in their specialty of Anesthesia, and subspecialty of Pain Management, but they have also received further training in the sub-subspecialty of Interventional Pain Management, in order to become Board-certified as INTERVENTIONAL PAIN MANAGEMENT SPECIALIST.    Mission: Our goal is to use our skills in  [place] as alternatives to the chronic use of prescription opioid medications for the treatment of pain. To make this more clear, we have changed our name to reflect what we do and offer. We will continue to offer medication management assessment and recommendations, but we will not be taking over any patient's medication management.  ____________________________________________________________________________________________    ______________________________________________________________________  Procedure instructions  Do not eat or drink fluids (other than water) for 6 hours before your procedure  No water for 2 hours before your procedure  Take your blood pressure medicine with a sip of water  Arrive 30 minutes before your appointment  Carefully read the "Preparing for your procedure" detailed instructions  If you have questions call us at [phone]  _____________________________________________________________________    ______________________________________________________________________  Preparing for your procedure  During your procedure appointment there will be: No Prescription Refills. No disability issues to discussed. No medication  changes or discussions.  Instructions: Food intake: Avoid eating anything solid for at least 8 hours prior to your procedure. Clear liquid intake: You may take clear liquids such as water up to 2 hours prior to your procedure. (No carbonated drinks. No soda.) Transportation: Unless otherwise stated by your physician, bring a driver. Morning Medicines: Except for blood thinners, take all of your other morning medications with a sip of water. Make sure to take your heart and blood pressure medicines. If your blood pressure's lower number is above 100, the case will be rescheduled. Blood thinners: Make sure to stop your blood thinners as instructed.  If you take a blood thinner, but were not instructed to stop it, call our office [phone] and ask to talk to a nurse. Not stopping a blood thinner prior to certain procedures could lead to serious complications. Diabetics on insulin: Notify the staff so that you can be scheduled 1st case in the morning. If your diabetes requires high dose insulin, take only  of your normal insulin dose the morning of the procedure and notify the staff that you have done so. Preventing infections: Shower with an antibacterial soap the morning of your procedure.  Build-up your immune system: Take 1000 mg of Vitamin C with every meal (3 times a day) the day prior to your procedure. Antibiotics: Inform the nursing staff if you are taking any antibiotics or if you have any conditions that may require antibiotics prior to procedures. (Example: recent joint implants)   Pregnancy: If you are pregnant make sure to notify the nursing staff. Not doing so may result in injury to the fetus, including death.  Sickness: If you have a cold, fever, or any active infections, call and cancel or reschedule your procedure. Receiving steroids while having an infection may result in complications. Arrival: You must be in the facility at least 30 minutes prior to your scheduled  procedure. Tardiness: Your scheduled time is also the cutoff time. If you do not arrive at least 15 minutes prior to your procedure, you will be rescheduled.  Children: Do not bring any children with you. Make arrangements to keep them home. Dress appropriately: There is always a possibility that your clothing may get soiled. Avoid long dresses. Valuables: Do not bring any jewelry or  valuables.  Reasons to call and reschedule or cancel your procedure: (Following these recommendations will minimize the risk of a serious complication.) Surgeries: Avoid having procedures within 2 weeks of any surgery. (Avoid for 2 weeks before or after any surgery). Flu Shots: Avoid having procedures within 2 weeks of a flu shots or . (Avoid for 2 weeks before or after immunizations). Barium: Avoid having a procedure within 7-10 days after having had a radiological study involving the use of radiological contrast. (Myelograms, Barium swallow or enema study). Heart attacks: Avoid any elective procedures or surgeries for the initial 6 months after a "Myocardial Infarction" (Heart Attack). Blood thinners: It is imperative that you stop these medications before procedures. Let us know if you if you take any blood thinner.  Infection: Avoid procedures during or within two weeks of an infection (including chest colds or gastrointestinal problems). Symptoms associated with infections include: Localized redness, fever, chills, night sweats or profuse sweating, burning sensation when voiding, cough, congestion, stuffiness, runny nose, sore throat, diarrhea, nausea, vomiting, cold or Flu symptoms, recent or current infections. It is specially important if the infection is over the area that we intend to treat. Heart and lung problems: Symptoms that may suggest an active cardiopulmonary problem include: cough, chest pain, breathing difficulties or shortness of breath, dizziness, ankle swelling, uncontrolled high or unusually low  blood pressure, and/or palpitations. If you are experiencing any of these symptoms, cancel your procedure and contact your primary care physician for an evaluation.  Remember:  Regular Business hours are:  Monday to Thursday 8:00 AM to 4:00 PM  Provider's Schedule: Milinda Pointer, MD:  Procedure days: Tuesday and Thursday 7:30 AM to 4:00 PM  Gillis Santa, MD:  Procedure days: Monday and Wednesday 7:30 AM to 4:00 PM  ______________________________________________________________________    ____________________________________________________________________________________________  General Risks and Possible Complications  Patient Responsibilities: It is important that you read this as it is part of your informed consent. It is our duty to inform you of the risks and possible complications associated with treatments offered to you. It is your responsibility as a patient to read this and to ask questions about anything that is not clear or that you believe was not covered in this document.  Patient's Rights: You have the right to refuse treatment. You also have the right to change your mind, even after initially having agreed to have the treatment done. However, under this last option, if you wait until the last second to change your mind, you may be charged for the materials used up to that point.  Introduction: Medicine is not an Chief Strategy Officer. Everything in Medicine, including the lack of treatment(s), carries the potential for danger, harm, or loss (which is by definition: Risk). In Medicine, a complication is a secondary problem, condition, or disease that can aggravate an already existing one. All treatments carry the risk of possible complications. The fact that a side effects or complications occurs, does not imply that the treatment was conducted incorrectly. It must be clearly understood that these can happen even when everything is done following the highest safety  standards.  No treatment: You can choose not to proceed with the proposed treatment alternative. The "PRO(s)" would include: avoiding the risk of complications associated with the therapy. The "CON(s)" would include: not getting any of the treatment benefits. These benefits fall under one of three categories: diagnostic; therapeutic; and/or palliative. Diagnostic benefits include: getting information which can ultimately lead to improvement of the disease or symptom(s). Therapeutic  benefits are those associated with the successful treatment of the disease. Finally, palliative benefits are those related to the decrease of the primary symptoms, without necessarily curing the condition (example: decreasing the pain from a flare-up of a chronic condition, such as incurable terminal cancer).  General Risks and Complications: These are associated to most interventional treatments. They can occur alone, or in combination. They fall under one of the following six (6) categories: no benefit or worsening of symptoms; bleeding; infection; nerve damage; allergic reactions; and/or death. No benefits or worsening of symptoms: In Medicine there are no guarantees, only probabilities. No healthcare provider can ever guarantee that a medical treatment will work, they can only state the probability that it may. Furthermore, there is always the possibility that the condition may worsen, either directly, or indirectly, as a consequence of the treatment. Bleeding: This is more common if the patient is taking a blood thinner, either prescription or over the counter (example: Goody Powders, Fish oil, Aspirin, Garlic, etc.), or if suffering a condition associated with impaired coagulation (example: Hemophilia, cirrhosis of the liver, low platelet counts, etc.). However, even if you do not have one on these, it can still happen. If you have any of these conditions, or take one of these drugs, make sure to notify your treating  physician. Infection: This is more common in patients with a compromised immune system, either due to disease (example: diabetes, cancer, human immunodeficiency virus [HIV], etc.), or due to medications or treatments (example: therapies used to treat cancer and rheumatological diseases). However, even if you do not have one on these, it can still happen. If you have any of these conditions, or take one of these drugs, make sure to notify your treating physician. Nerve Damage: This is more common when the treatment is an invasive one, but it can also happen with the use of medications, such as those used in the treatment of cancer. The damage can occur to small secondary nerves, or to large primary ones, such as those in the spinal cord and brain. This damage may be temporary or permanent and it may lead to impairments that can range from temporary numbness to permanent paralysis and/or brain death. Allergic Reactions: Any time a substance or material comes in contact with our body, there is the possibility of an allergic reaction. These can range from a mild skin rash (contact dermatitis) to a severe systemic reaction (anaphylactic reaction), which can result in death. Death: In general, any medical intervention can result in death, most of the time due to an unforeseen complication. ____________________________________________________________________________________________

## 2022-07-24 NOTE — Progress Notes (Signed)
Safety precautions to be maintained throughout the outpatient stay will include: orient to surroundings, keep bed in low position, maintain call bell within reach at all times, provide assistance with transfer out of bed and ambulation.   Pt blood pressure was retaken at 139/98, He was recommend to see his primary Dr. Today.

## 2022-07-26 ENCOUNTER — Encounter: Payer: Self-pay | Admitting: Physician Assistant

## 2022-07-26 LAB — COMPLIANCE DRUG ANALYSIS, UR

## 2022-08-01 LAB — COMP. METABOLIC PANEL (12)
AST: 13 IU/L (ref 0–40)
Albumin/Globulin Ratio: 1.7 (ref 1.2–2.2)
Albumin: 4 g/dL (ref 3.8–4.9)
Alkaline Phosphatase: 72 IU/L (ref 44–121)
BUN/Creatinine Ratio: 11 (ref 9–20)
BUN: 11 mg/dL (ref 6–24)
Bilirubin Total: 0.2 mg/dL (ref 0.0–1.2)
Calcium: 9.5 mg/dL (ref 8.7–10.2)
Chloride: 106 mmol/L (ref 96–106)
Creatinine, Ser: 0.98 mg/dL (ref 0.76–1.27)
Globulin, Total: 2.3 g/dL (ref 1.5–4.5)
Glucose: 91 mg/dL (ref 70–99)
Potassium: 4.5 mmol/L (ref 3.5–5.2)
Sodium: 142 mmol/L (ref 134–144)
Total Protein: 6.3 g/dL (ref 6.0–8.5)
eGFR: 89 mL/min/{1.73_m2} (ref >59)

## 2022-08-01 LAB — 25-HYDROXY VITAMIN D LCMS D2+D3
25-Hydroxy, Vitamin D-2: 1.2 ng/mL
25-Hydroxy, Vitamin D-3: 11 ng/mL
25-Hydroxy, Vitamin D: 12 ng/mL — ABNORMAL LOW

## 2022-08-01 LAB — MAGNESIUM: Magnesium: 2 mg/dL (ref 1.6–2.3)

## 2022-08-01 LAB — C-REACTIVE PROTEIN: CRP: <1 mg/L (ref 0–10)

## 2022-08-01 LAB — VITAMIN B12: Vitamin B-12: 400 pg/mL (ref 232–1245)

## 2022-08-01 LAB — SEDIMENTATION RATE: Sed Rate: 3 mm/hr (ref 0–30)

## 2022-08-20 ENCOUNTER — Ambulatory Visit (HOSPITAL_BASED_OUTPATIENT_CLINIC_OR_DEPARTMENT_OTHER): Payer: 59 | Admitting: Pain Medicine

## 2022-08-20 DIAGNOSIS — Z91199 Patient's noncompliance with other medical treatment and regimen due to unspecified reason: Secondary | ICD-10-CM

## 2022-08-20 DIAGNOSIS — E559 Vitamin D deficiency, unspecified: Secondary | ICD-10-CM | POA: Insufficient documentation

## 2022-08-20 NOTE — Progress Notes (Deleted)
(  08/20/2022) NO SHOW to procedure  2nd visit appointment.

## 2022-08-20 NOTE — Progress Notes (Deleted)
PROVIDER NOTE: Interpretation of information contained herein should be left to medically-trained personnel. Specific patient instructions are provided elsewhere under "Patient Instructions" section of medical record. This document was created in part using STT-dictation technology, any transcriptional errors that may result from this process are unintentional.  Patient: Charles Estes Type: Established DOB: 1961/04/29 MRN: [id number] PCP: Samara Deist, MD  Service: Procedure DOS: 08/20/2022 Setting: Ambulatory Location: Ambulatory outpatient facility Delivery: Face-to-face Provider: Gaspar Cola, MD Specialty: Interventional Pain Management Specialty designation: 09 Location: Outpatient facility Ref. Prov.: Milinda Pointer, MD       Interventional Therapy   Procedure: Lumbar epidural steroid injection (LESI) (interlaminar) #1    Laterality: Left   Level:  L4-5 Level.  Imaging: Fluoroscopic guidance         Anesthesia: Local anesthesia (1-2% Lidocaine) Anxiolysis: IV                 Sedation:                         DOS: 08/20/2022  Performed by: Gaspar Cola, MD  Purpose: Diagnostic/Therapeutic Indications: Lumbar radicular pain of intraspinal etiology of more than 4 weeks that has failed to respond to conservative therapy and is severe enough to impact quality of life or function. No diagnosis found. NAS-11 Pain score:   Pre-procedure:  /10   Post-procedure:  /10      Position / Prep / Materials:  Position: Prone w/ head of the table raised (slight reverse trendelenburg) to facilitate breathing.  Prep solution: DuraPrep (Iodine Povacrylex [0.7% available iodine] and Isopropyl Alcohol, 74% w/w) Prep Area: Entire Posterior Lumbar Region from lower scapular tip down to mid buttocks area and from flank to flank. Materials:  Tray: Epidural tray Needle(s):  Type: Epidural needle (Tuohy) Gauge (G):  17 Length: Regular (3.5-in) Qty: 1   Pre-op H&P Assessment:   Charles Estes is a 62 y.o. (year old), male patient, seen today for interventional treatment. He  has a past surgical history that includes Mandible fracture surgery (2014) and ORIF ulnar fracture (Right, 07/16/2017). Charles Estes has a current medication list which includes the following prescription(s): acetaminophen, amlodipine, aripiprazole, aspirin ec, carvedilol, fluoxetine, hydralazine, pantoprazole, pravastatin, tamsulosin, [DISCONTINUED] calcium-vitamin d, and [DISCONTINUED] promethazine. His primarily concern today is the No chief complaint on file.  Initial Vital Signs:  Pulse/HCG Rate:    Temp:   Resp:   BP:   SpO2:    BMI: Estimated body mass index is 32.28 kg/m as calculated from the following:   Height as of 07/24/22: 5\' 6"  (1.676 m).   Weight as of 07/24/22: 200 lb (90.7 kg).  Risk Assessment: Allergies: Reviewed. He is allergic to paxil [paroxetine].  Allergy Precautions: None required Coagulopathies: Reviewed. None identified.  Blood-thinner therapy: None at this time Active Infection(s): Reviewed. None identified. Charles Estes is afebrile  Site Confirmation: Charles Estes was asked to confirm the procedure and laterality before marking the site Procedure checklist: Completed Consent: Before the procedure and under the influence of no sedative(s), amnesic(s), or anxiolytics, the patient was informed of the treatment options, risks and possible complications. To fulfill our ethical and legal obligations, as recommended by the American Medical Association's Code of Ethics, I have informed the patient of my clinical impression; the nature and purpose of the treatment or procedure; the risks, benefits, and possible complications of the intervention; the alternatives, including doing nothing; the risk(s) and benefit(s) of the alternative treatment(s) or procedure(s); and the  risk(s) and benefit(s) of doing nothing. The patient was provided information about the general risks and possible  complications associated with the procedure. These may include, but are not limited to: failure to achieve desired goals, infection, bleeding, organ or nerve damage, allergic reactions, paralysis, and death. In addition, the patient was informed of those risks and complications associated to Spine-related procedures, such as failure to decrease pain; infection (i.e.: Meningitis, epidural or intraspinal abscess); bleeding (i.e.: epidural hematoma, subarachnoid hemorrhage, or any other type of intraspinal or peri-dural bleeding); organ or nerve damage (i.e.: Any type of peripheral nerve, nerve root, or spinal cord injury) with subsequent damage to sensory, motor, and/or autonomic systems, resulting in permanent pain, numbness, and/or weakness of one or several areas of the body; allergic reactions; (i.e.: anaphylactic reaction); and/or death. Furthermore, the patient was informed of those risks and complications associated with the medications. These include, but are not limited to: allergic reactions (i.e.: anaphylactic or anaphylactoid reaction(s)); adrenal axis suppression; blood sugar elevation that in diabetics may result in ketoacidosis or comma; water retention that in patients with history of congestive heart failure may result in shortness of breath, pulmonary edema, and decompensation with resultant heart failure; weight gain; swelling or edema; medication-induced neural toxicity; particulate matter embolism and blood vessel occlusion with resultant organ, and/or nervous system infarction; and/or aseptic necrosis of one or more joints. Finally, the patient was informed that Medicine is not an exact science; therefore, there is also the possibility of unforeseen or unpredictable risks and/or possible complications that may result in a catastrophic outcome. The patient indicated having understood very clearly. We have given the patient no guarantees and we have made no promises. Enough time was given to the  patient to ask questions, all of which were answered to the patient's satisfaction. Charles Estes has indicated that he wanted to continue with the procedure. Attestation: I, the ordering provider, attest that I have discussed with the patient the benefits, risks, side-effects, alternatives, likelihood of achieving goals, and potential problems during recovery for the procedure that I have provided informed consent. Date  Time: {CHL [facility]-PAIN TIME CHOICES:[id number]}   Pre-Procedure Preparation:  Monitoring: As per clinic protocol. Respiration, ETCO2, SpO2, BP, heart rate and rhythm monitor placed and checked for adequate function Safety Precautions: Patient was assessed for positional comfort and pressure points before starting the procedure. Time-out: I initiated and conducted the "Time-out" before starting the procedure, as per protocol. The patient was asked to participate by confirming the accuracy of the "Time Out" information. Verification of the correct person, site, and procedure were performed and confirmed by me, the nursing staff, and the patient. "Time-out" conducted as per Joint Commission's Universal Protocol (UP.01.01.01). Time:    Description/Narrative of Procedure:          Target: Epidural space via interlaminar opening, initially targeting the lower laminar border of the superior vertebral body. Region: Lumbar Approach: Percutaneous paravertebral  Rationale (medical necessity): procedure needed and proper for the diagnosis and/or treatment of the patient's medical symptoms and needs. Procedural Technique Safety Precautions: Aspiration looking for blood return was conducted prior to all injections. At no point did we inject any substances, as a needle was being advanced. No attempts were made at seeking any paresthesias. Safe injection practices and needle disposal techniques used. Medications properly checked for expiration dates. SDV (single dose vial) medications used. Description  of the Procedure: Protocol guidelines were followed. The procedure needle was introduced through the skin, ipsilateral to the reported pain, and advanced  to the target area. Bone was contacted and the needle walked caudad, until the lamina was cleared. The epidural space was identified using "loss-of-resistance technique" with 2-3 ml of PF-NaCl (0.9% NSS), in a 5cc LOR glass syringe.  There were no vitals filed for this visit.  Start Time:   hrs. End Time:   hrs.  Imaging Guidance (Spinal):          Type of Imaging Technique: Fluoroscopy Guidance (Spinal) Indication(s): Assistance in needle guidance and placement for procedures requiring needle placement in or near specific anatomical locations not easily accessible without such assistance. Exposure Time: Please see nurses notes. Contrast: Before injecting any contrast, we confirmed that the patient did not have an allergy to iodine, shellfish, or radiological contrast. Once satisfactory needle placement was completed at the desired level, radiological contrast was injected. Contrast injected under live fluoroscopy. No contrast complications. See chart for type and volume of contrast used. Fluoroscopic Guidance: I was personally present during the use of fluoroscopy. "Tunnel Vision Technique" used to obtain the best possible view of the target area. Parallax error corrected before commencing the procedure. "Direction-depth-direction" technique used to introduce the needle under continuous pulsed fluoroscopy. Once target was reached, antero-posterior, oblique, and lateral fluoroscopic projection used confirm needle placement in all planes. Images permanently stored in EMR. Interpretation: I personally interpreted the imaging intraoperatively. Adequate needle placement confirmed in multiple planes. Appropriate spread of contrast into desired area was observed. No evidence of afferent or efferent intravascular uptake. No intrathecal or subarachnoid spread  observed. Permanent images saved into the patient's record.  Antibiotic Prophylaxis:   Anti-infectives (From admission, onward)    None      Indication(s): None identified  Post-operative Assessment:  Post-procedure Vital Signs:  Pulse/HCG Rate:    Temp:   Resp:   BP:   SpO2:    EBL: None  Complications: No immediate post-treatment complications observed by team, or reported by patient.  Note: The patient tolerated the entire procedure well. A repeat set of vitals were taken after the procedure and the patient was kept under observation following institutional policy, for this type of procedure. Post-procedural neurological assessment was performed, showing return to baseline, prior to discharge. The patient was provided with post-procedure discharge instructions, including a section on how to identify potential problems. Should any problems arise concerning this procedure, the patient was given instructions to immediately contact us, at any time, without hesitation. In any case, we plan to contact the patient by telephone for a follow-up status report regarding this interventional procedure.  Comments:  No additional relevant information.  Plan of Care (POC)  Orders:  No orders of the defined types were placed in this encounter.  Chronic Opioid Analgesic:  None MME/day: 0 mg/day   Medications ordered for procedure: No orders of the defined types were placed in this encounter.  Medications administered: Darlene Juday had no medications administered during this visit.  See the medical record for exact dosing, route, and time of administration.  Follow-up plan:   No follow-ups on file.       Interventional Therapies  Risk Factors  Considerations:  Uncontrolled hypertension     Planned  Pending:   Diagnostic left L4-5 LESI #1  Diagnostic lumbar spine x-rays with flexion views    Under consideration:   Pending completion of evaluation   Completed:   None at this  time   Completed by other providers:   None at this time   Therapeutic  Palliative (PRN) options:  None established       Recent Visits Date Type Provider Dept  07/24/22 Office Visit Milinda Pointer, MD [facility]  Showing recent visits within past 90 days and meeting all other requirements Today's Visits Date Type Provider Dept  08/20/22 Appointment Milinda Pointer, MD [facility]  Showing today's visits and meeting all other requirements Future Appointments No visits were found meeting these conditions. Showing future appointments within next 90 days and meeting all other requirements  Disposition: Discharge home  Discharge (Date  Time): 08/20/2022;   hrs.   Primary Care Physician: Samara Deist, MD Location: [facility] Note by: Gaspar Cola, MD (TTS technology used. I apologize for any typographical errors that were not detected and corrected.) Date: 08/20/2022; Time: 5:26 AM  Disclaimer:  Medicine is not an Chief Strategy Officer. The only guarantee in medicine is that nothing is guaranteed. It is important to note that the decision to proceed with this intervention was based on the information collected from the patient. The Data and conclusions were drawn from the patient's questionnaire, the interview, and the physical examination. Because the information was provided in large part by the patient, it cannot be guaranteed that it has not been purposely or unconsciously manipulated. Every effort has been made to obtain as much relevant data as possible for this evaluation. It is important to note that the conclusions that lead to this procedure are derived in large part from the available data. Always take into account that the treatment will also be dependent on availability of resources and existing treatment guidelines, considered by other Pain Management Practitioners as being common knowledge and practice, at the  time of the intervention. For Medico-Legal purposes, it is also important to point out that variation in procedural techniques and pharmacological choices are the acceptable norm. The indications, contraindications, technique, and results of the above procedure should only be interpreted and judged by a Board-Certified Interventional Pain Specialist with extensive familiarity and expertise in the same exact procedure and technique.

## 2022-08-20 NOTE — Progress Notes (Signed)
(  08/20/2022) NO SHOW to procedure  2nd Visit appointment.

## 2022-08-21 ENCOUNTER — Encounter: Payer: Self-pay | Admitting: Pain Medicine

## 2022-08-21 ENCOUNTER — Ambulatory Visit: Payer: No Typology Code available for payment source | Attending: Pain Medicine | Admitting: Pain Medicine

## 2022-08-21 VITALS — BP 112/68 | HR 69 | Temp 97.5°F | Resp 14 | Ht 66.0 in | Wt 200.0 lb

## 2022-08-21 DIAGNOSIS — M4310 Spondylolisthesis, site unspecified: Secondary | ICD-10-CM

## 2022-08-21 DIAGNOSIS — M51369 Other intervertebral disc degeneration, lumbar region without mention of lumbar back pain or lower extremity pain: Secondary | ICD-10-CM

## 2022-08-21 DIAGNOSIS — F32A Depression, unspecified: Secondary | ICD-10-CM

## 2022-08-21 DIAGNOSIS — F315 Bipolar disorder, current episode depressed, severe, with psychotic features: Secondary | ICD-10-CM | POA: Diagnosis present

## 2022-08-21 DIAGNOSIS — F411 Generalized anxiety disorder: Secondary | ICD-10-CM | POA: Diagnosis present

## 2022-08-21 DIAGNOSIS — M5136 Other intervertebral disc degeneration, lumbar region: Secondary | ICD-10-CM | POA: Diagnosis not present

## 2022-08-21 DIAGNOSIS — E559 Vitamin D deficiency, unspecified: Secondary | ICD-10-CM

## 2022-08-21 DIAGNOSIS — G8929 Other chronic pain: Secondary | ICD-10-CM | POA: Diagnosis present

## 2022-08-21 DIAGNOSIS — Z87898 Personal history of other specified conditions: Secondary | ICD-10-CM

## 2022-08-21 DIAGNOSIS — M4316 Spondylolisthesis, lumbar region: Secondary | ICD-10-CM

## 2022-08-21 DIAGNOSIS — M5442 Lumbago with sciatica, left side: Secondary | ICD-10-CM | POA: Diagnosis not present

## 2022-08-21 DIAGNOSIS — M79605 Pain in left leg: Secondary | ICD-10-CM | POA: Diagnosis not present

## 2022-08-21 MED ORDER — VITAMIN D3 125 MCG (5000 UT) PO CAPS: 5000.0000 [IU] | ORAL_CAPSULE | Freq: Every day | ORAL | 0 refills | Status: DC

## 2022-08-21 MED ORDER — ERGOCALCIFEROL 1.25 MG (50000 UT) PO CAPS: 50000.0000 [IU] | ORAL_CAPSULE | ORAL | 0 refills | Status: DC

## 2022-08-21 NOTE — Patient Instructions (Signed)

## 2022-08-21 NOTE — Progress Notes (Signed)
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 Grade 1 Retrolisthesis of L5/S1, Vitamin D deficiency, Bipolar disorder, curr episode depressed, severe, w/psychotic features ([place]), Anxiety state, History of substance use disorder, and Depression, unspecified depression type were also pertinent to this visit.  Visit Diagnosis: 1. Chronic low back pain (1ry area of Pain) (Bilateral)  w/ sciatica (Left)   2. Chronic lower extremity pain (2ry area of Pain) (Left)   3. DDD (degenerative disc disease), lumbar   4. 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 Grade 1 Retrolisthesis of L5/S1   6. Vitamin D deficiency   7. Bipolar disorder, curr episode depressed, severe, w/psychotic features ([place])   8. Anxiety state   9. History of substance use disorder   10. Depression, unspecified  depression type    Problems updated and reviewed during this visit: Problem  Depression    Plan of Care  Pharmacotherapy (Medications Ordered): Meds ordered this encounter  Medications   ergocalciferol (VITAMIN D2) 1.25 MG (50000 UT) capsule    Sig: Take 1 capsule (50,000 Units total) by mouth 2 (two) times a week. X 6 weeks.    Dispense:  12 capsule    Refill:  0    Fill one day early if pharmacy is closed on scheduled refill date. May substitute for generic, or similar, if available.   Cholecalciferol (VITAMIN D3) 125 MCG (5000 UT) capsule    Sig: Take 1 capsule (5,000 Units total) by mouth daily with breakfast. Take along with calcium and magnesium.    Dispense:  90 capsule    Refill:  0    Fill 1 day early if pharmacy is closed on scheduled refill date. Generic permitted. Do not send renewal requests.   Procedure Orders         Lumbar Epidural Injection     Lab Orders  No laboratory test(s) ordered today   Imaging Orders  No imaging studies ordered today   Referral Orders         Ambulatory referral to Psychiatry      Pharmacological management:  Opioid Analgesics: I will not be prescribing any opioids at this time Membrane stabilizer: I will not be prescribing any at this time Muscle relaxant: I will not be prescribing any at this time NSAID: I will not be prescribing any at this time Other analgesic(s): I will not be prescribing any at this time      Interventional Therapies  Risk Factors  Considerations:  Uncontrolled hypertension     Planned  Pending:   Diagnostic left L1-2 LESI #1    Under consideration:   Diagnostic left L4-5 LESI #1    Completed:   None at this time   Completed by other providers:   None at this time   Therapeutic  Palliative (PRN) options:   None established       Provider-requested follow-up: Return for (ECT): (L) L1-2 LESI #1. Recent Visits Date Type Provider Dept  08/20/22 Procedure visit Milinda Pointer, MD  [facility]  07/24/22 Office Visit Milinda Pointer, MD [facility]  Showing recent visits within past 90 days and meeting all other requirements Today's Visits Date Type Provider Dept  08/21/22 Office Visit Milinda Pointer, MD [facility]  Showing today's visits and meeting all other requirements Future Appointments No visits were found meeting these conditions. Showing future appointments within next 90 days and meeting all other requirements   Primary Care Physician: Samara Deist, MD  Duration of encounter: 30 minutes.  Total time on encounter, as per AMA guidelines included both the face-to-face and non-face-to-face time personally spent by the physician and/or other qualified health care professional(s) on the day of the encounter (includes time in activities that require the physician or other qualified health care professional and does not include time in activities  normally performed by clinical staff). Physician's time may include the following activities when performed: Preparing to see the patient (e.g., pre-charting review of records, searching for previously ordered imaging, lab work, and nerve conduction tests) Review of prior analgesic pharmacotherapies. Reviewing PMP Interpreting ordered tests (e.g., lab work, imaging, nerve conduction tests) Performing post-procedure evaluations, including interpretation of diagnostic procedures Obtaining and/or reviewing separately obtained history Performing a medically appropriate examination and/or evaluation Counseling and educating the patient/family/caregiver Ordering medications, tests, or procedures Referring and communicating with other health care professionals (when not separately reported) Documenting clinical information in the electronic or other health record Independently interpreting results (not separately reported) and communicating results to the patient/ family/caregiver Care  coordination (not separately reported)  Note by: Gaspar Cola, MD (TTS technology used. I apologize for any typographical errors that were not detected and corrected.) Date: 08/21/2022; Time: 10:16 AM

## 2022-08-23 ENCOUNTER — Ambulatory Visit (INDEPENDENT_AMBULATORY_CARE_PROVIDER_SITE_OTHER)
Admission: EM | Admit: 2022-08-23 | Discharge: 2022-08-26 | Disposition: A | Discharge status: Other Acute Inpatient Hospital | Payer: 59 | Source: Home / Self Care

## 2022-08-23 ENCOUNTER — Emergency Department (EMERGENCY_DEPARTMENT_HOSPITAL)
Admission: EM | Admit: 2022-08-23 | Discharge: 2022-08-23 | Disposition: A | Discharge status: Other Acute Inpatient Hospital | Payer: 59 | Source: Home / Self Care | Attending: Emergency Medicine | Admitting: Emergency Medicine

## 2022-08-23 ENCOUNTER — Emergency Department (HOSPITAL_COMMUNITY): Payer: 59

## 2022-08-23 ENCOUNTER — Encounter (HOSPITAL_COMMUNITY): Payer: Self-pay

## 2022-08-23 ENCOUNTER — Other Ambulatory Visit: Payer: Self-pay

## 2022-08-23 DIAGNOSIS — R45851 Suicidal ideations: Secondary | ICD-10-CM | POA: Insufficient documentation

## 2022-08-23 DIAGNOSIS — F1420 Cocaine dependence, uncomplicated: Secondary | ICD-10-CM | POA: Insufficient documentation

## 2022-08-23 DIAGNOSIS — R0789 Other chest pain: Secondary | ICD-10-CM | POA: Insufficient documentation

## 2022-08-23 DIAGNOSIS — F4310 Post-traumatic stress disorder, unspecified: Secondary | ICD-10-CM | POA: Insufficient documentation

## 2022-08-23 DIAGNOSIS — Z1152 Encounter for screening for COVID-19: Secondary | ICD-10-CM | POA: Insufficient documentation

## 2022-08-23 DIAGNOSIS — Z789 Other specified health status: Secondary | ICD-10-CM | POA: Diagnosis present

## 2022-08-23 DIAGNOSIS — R45850 Homicidal ideations: Secondary | ICD-10-CM | POA: Insufficient documentation

## 2022-08-23 DIAGNOSIS — R42 Dizziness and giddiness: Secondary | ICD-10-CM | POA: Insufficient documentation

## 2022-08-23 DIAGNOSIS — Z5900 Homelessness unspecified: Secondary | ICD-10-CM | POA: Insufficient documentation

## 2022-08-23 DIAGNOSIS — Z79899 Other long term (current) drug therapy: Secondary | ICD-10-CM | POA: Insufficient documentation

## 2022-08-23 DIAGNOSIS — F315 Bipolar disorder, current episode depressed, severe, with psychotic features: Secondary | ICD-10-CM | POA: Insufficient documentation

## 2022-08-23 DIAGNOSIS — R079 Chest pain, unspecified: Secondary | ICD-10-CM | POA: Insufficient documentation

## 2022-08-23 DIAGNOSIS — F23 Brief psychotic disorder: Secondary | ICD-10-CM | POA: Diagnosis present

## 2022-08-23 DIAGNOSIS — Z7982 Long term (current) use of aspirin: Secondary | ICD-10-CM | POA: Insufficient documentation

## 2022-08-23 DIAGNOSIS — R519 Headache, unspecified: Secondary | ICD-10-CM | POA: Insufficient documentation

## 2022-08-23 DIAGNOSIS — F1090 Alcohol use, unspecified, uncomplicated: Secondary | ICD-10-CM | POA: Diagnosis present

## 2022-08-23 DIAGNOSIS — F1210 Cannabis abuse, uncomplicated: Secondary | ICD-10-CM | POA: Insufficient documentation

## 2022-08-23 DIAGNOSIS — F1290 Cannabis use, unspecified, uncomplicated: Secondary | ICD-10-CM | POA: Diagnosis present

## 2022-08-23 LAB — CBC
HCT: 38.2 % — ABNORMAL LOW (ref 39.0–52.0)
Hemoglobin: 13.1 g/dL (ref 13.0–17.0)
MCH: 31.6 pg (ref 26.0–34.0)
MCHC: 34.3 g/dL (ref 30.0–36.0)
MCV: 92.3 fL (ref 80.0–100.0)
Platelets: 281 10*3/uL (ref 150–400)
RBC: 4.14 MIL/uL — ABNORMAL LOW (ref 4.22–5.81)
RDW: 13.1 % (ref 11.5–15.5)
WBC: 8.2 10*3/uL (ref 4.0–10.5)
nRBC: 0 % (ref 0.0–0.2)

## 2022-08-23 LAB — LIPID PANEL
Cholesterol: 199 mg/dL (ref 0–200)
HDL: 43 mg/dL (ref >40)
LDL Cholesterol: 142 mg/dL — ABNORMAL HIGH (ref 0–99)
Total CHOL/HDL Ratio: 4.6 RATIO
Triglycerides: 69 mg/dL (ref <150)
VLDL: 14 mg/dL (ref 0–40)

## 2022-08-23 LAB — CBG MONITORING, ED: Glucose-Capillary: 100 mg/dL — ABNORMAL HIGH (ref 70–99)

## 2022-08-23 LAB — RAPID URINE DRUG SCREEN, HOSP PERFORMED
Amphetamines: NOT DETECTED
Barbiturates: NOT DETECTED
Benzodiazepines: NOT DETECTED
Cocaine: POSITIVE — AB
Opiates: NOT DETECTED
Tetrahydrocannabinol: NOT DETECTED

## 2022-08-23 LAB — RESP PANEL BY RT-PCR (RSV, FLU A&B, COVID)  RVPGX2
Influenza A by PCR: NEGATIVE
Influenza B by PCR: NEGATIVE
Resp Syncytial Virus by PCR: NEGATIVE
SARS Coronavirus 2 by RT PCR: NEGATIVE

## 2022-08-23 LAB — BASIC METABOLIC PANEL
Anion gap: 10 (ref 5–15)
BUN: 15 mg/dL (ref 6–20)
CO2: 23 mmol/L (ref 22–32)
Calcium: 9.1 mg/dL (ref 8.9–10.3)
Chloride: 105 mmol/L (ref 98–111)
Creatinine, Ser: 1.2 mg/dL (ref 0.61–1.24)
GFR, Estimated: >60 mL/min (ref >60)
Glucose, Bld: 104 mg/dL — ABNORMAL HIGH (ref 70–99)
Potassium: 4 mmol/L (ref 3.5–5.1)
Sodium: 138 mmol/L (ref 135–145)

## 2022-08-23 LAB — ACETAMINOPHEN LEVEL: Acetaminophen (Tylenol), Serum: <10 ug/mL — ABNORMAL LOW (ref 10–30)

## 2022-08-23 LAB — SALICYLATE LEVEL: Salicylate Lvl: <7 mg/dL — ABNORMAL LOW (ref 7.0–30.0)

## 2022-08-23 LAB — ETHANOL: Alcohol, Ethyl (B): <10 mg/dL (ref <10)

## 2022-08-23 LAB — TROPONIN I (HIGH SENSITIVITY): Troponin I (High Sensitivity): 5 ng/L (ref <18)

## 2022-08-23 MED ORDER — GABAPENTIN 100 MG PO CAPS
200.0000 mg | ORAL_CAPSULE | Freq: Three times a day (TID) | ORAL | Status: DC
  Administered 2022-08-23 – 2022-08-26 (×7): 200 mg via ORAL
  Filled 2022-08-23 (×8): qty 2

## 2022-08-23 MED ORDER — PANTOPRAZOLE SODIUM 40 MG PO TBEC
40.0000 mg | DELAYED_RELEASE_TABLET | Freq: Every day | ORAL | Status: DC
  Administered 2022-08-23: 40 mg via ORAL
  Filled 2022-08-23: qty 1

## 2022-08-23 MED ORDER — ASPIRIN 81 MG PO TBEC
81.0000 mg | DELAYED_RELEASE_TABLET | Freq: Every day | ORAL | Status: DC
  Administered 2022-08-23: 81 mg via ORAL
  Filled 2022-08-23: qty 1

## 2022-08-23 MED ORDER — ALUM & MAG HYDROXIDE-SIMETH 200-200-20 MG/5ML PO SUSP: 30.0000 mL | ORAL | Status: DC | PRN

## 2022-08-23 MED ORDER — FLUOXETINE HCL 10 MG PO CAPS
10.0000 mg | ORAL_CAPSULE | Freq: Every day | ORAL | Status: DC
  Administered 2022-08-23: 10 mg via ORAL
  Filled 2022-08-23: qty 1

## 2022-08-23 MED ORDER — PANTOPRAZOLE SODIUM 40 MG PO TBEC
40.0000 mg | DELAYED_RELEASE_TABLET | Freq: Every day | ORAL | Status: DC
  Administered 2022-08-24 – 2022-08-26 (×3): 40 mg via ORAL
  Filled 2022-08-23 (×3): qty 1

## 2022-08-23 MED ORDER — ACETAMINOPHEN 325 MG PO TABS
650.0000 mg | ORAL_TABLET | Freq: Four times a day (QID) | ORAL | Status: DC | PRN
  Administered 2022-08-25 – 2022-08-26 (×2): 650 mg via ORAL
  Filled 2022-08-23 (×3): qty 2

## 2022-08-23 MED ORDER — TRAZODONE HCL 50 MG PO TABS
50.0000 mg | ORAL_TABLET | Freq: Every evening | ORAL | Status: DC | PRN
  Administered 2022-08-23 – 2022-08-25 (×2): 50 mg via ORAL
  Filled 2022-08-23 (×2): qty 1

## 2022-08-23 MED ORDER — AMLODIPINE BESYLATE 10 MG PO TABS
10.0000 mg | ORAL_TABLET | Freq: Every day | ORAL | Status: DC
  Administered 2022-08-24 – 2022-08-26 (×3): 10 mg via ORAL
  Filled 2022-08-23 (×3): qty 1

## 2022-08-23 MED ORDER — FLUOXETINE HCL 10 MG PO CAPS
10.0000 mg | ORAL_CAPSULE | Freq: Every day | ORAL | Status: DC
  Administered 2022-08-24: 10 mg via ORAL
  Filled 2022-08-23 (×2): qty 1

## 2022-08-23 MED ORDER — HYDROXYZINE HCL 25 MG PO TABS
25.0000 mg | ORAL_TABLET | Freq: Three times a day (TID) | ORAL | Status: DC | PRN
  Administered 2022-08-23: 25 mg via ORAL
  Filled 2022-08-23: qty 1

## 2022-08-23 MED ORDER — GABAPENTIN 100 MG PO CAPS
200.0000 mg | ORAL_CAPSULE | Freq: Three times a day (TID) | ORAL | Status: DC
  Administered 2022-08-23: 200 mg via ORAL
  Filled 2022-08-23: qty 2

## 2022-08-23 MED ORDER — VITAMIN D 25 MCG (1000 UNIT) PO TABS
5000.0000 [IU] | ORAL_TABLET | Freq: Every day | ORAL | Status: DC
  Administered 2022-08-24 – 2022-08-26 (×3): 5000 [IU] via ORAL
  Filled 2022-08-23 (×3): qty 5

## 2022-08-23 MED ORDER — ASPIRIN 81 MG PO TBEC
81.0000 mg | DELAYED_RELEASE_TABLET | Freq: Every day | ORAL | Status: DC
  Administered 2022-08-24 – 2022-08-26 (×3): 81 mg via ORAL
  Filled 2022-08-23 (×3): qty 1

## 2022-08-23 NOTE — ED Notes (Signed)
Patient IVC'd 08/23/2022. EXPIRES 08/29/2021. 5 copies attached to clipboard at [facility] nurses[facility] station. Original copy in folder for magistrate. Copy with patient labels in medical records.

## 2022-08-23 NOTE — ED Notes (Signed)
Patient moving to room 51. IVC paperwork now attached to clipboard in purple.

## 2022-08-23 NOTE — ED Provider Notes (Signed)
Patient reassessed.  Is calm and cooperative.  Has been placed to behavioral health urgent care.  EMTALA paperwork filled out.   Fransico Meadow, MD 08/23/22 2151

## 2022-08-23 NOTE — ED Notes (Signed)
Pt requested to take a shower, shower supplies given. Pt in BR now, shower rules explained to pt

## 2022-08-23 NOTE — ED Notes (Signed)
Pt went for a CT scan w/writer

## 2022-08-23 NOTE — ED Notes (Signed)
During triage assessment pt informs this RN that he is homeless and having SI.

## 2022-08-23 NOTE — ED Triage Notes (Signed)
Pt presented to [facility] involuntarily. Pt was IVC'd due to homicidal ideations. Pt reported "wanting to kill eveyone in the house". Pt is paranoid and reports others are trying to kill him by fentanyl. Pt is calm and is able to accept direction. Pt is urgent.

## 2022-08-23 NOTE — ED Provider Notes (Signed)
[facility] Provider Note   CSN: [id number] Arrival date & time: 08/23/22  1416     History  Chief Complaint  Patient presents with   Chest Pain   Dizziness   Suicidal    Charles Estes is a 62 y.o. male medical history of polysubstance use disorder and chronic back problems presenting today due to homicidal ideation.  When I walk in the room patient is on the phone with a 911 operator.  He tells" I am been to kill everybody in that house and then myself."  Denies any AVH.  Does say that he "drinks little alcohol and somebody is poisoning him with fentanyl."  Reports last fentanyl poisoning to be on Tuesday.  He also mentions a headache and "feeling weird."  Says that it is interfering with his thoughts of homicide.  Says that it feels like pressure all over his head.  No visual disturbance.  Does not mention chest pain, shortness of breath or palpitations to me as he did to nursing staff.    Chest Pain Associated symptoms: headache   Associated symptoms: no dizziness   Dizziness Associated symptoms: headaches   Associated symptoms: no chest pain        Home Medications Prior to Admission medications   Medication Sig Start Date End Date Taking? Authorizing Provider  acetaminophen (TYLENOL) 500 MG tablet Take 500-1,000 mg by mouth daily as needed for mild pain or headache.    [provider]  amLODipine (NORVASC) 10 MG tablet Take 1 tablet (10 mg total) by mouth daily. 03/29/22   Thurnell Lose, MD  aspirin EC 81 MG tablet Take 81 mg by mouth daily.    [provider]  Cholecalciferol (VITAMIN D3) 125 MCG (5000 UT) capsule Take 1 capsule (5,000 Units total) by mouth daily with breakfast. Take along with calcium and magnesium. 08/21/22 11/19/22  Milinda Pointer, MD  ergocalciferol (VITAMIN D2) 1.25 MG (50000 UT) capsule Take 1 capsule (50,000 Units total) by mouth 2 (two) times a week. X 6 weeks. 08/22/22 10/03/22   Milinda Pointer, MD  FLUoxetine (PROZAC) 10 MG capsule Take 1 capsule (10 mg total) by mouth daily. 03/28/22   Thurnell Lose, MD  pantoprazole (PROTONIX) 40 MG tablet Take 1 tablet (40 mg total) by mouth daily. 03/28/22   Thurnell Lose, MD  calcium-vitamin D (OSCAL WITH D) 500-200 MG-UNIT tablet Take 1 tablet by mouth 3 (three) times daily. Patient not taking: Reported on 11/20/2017 07/16/17 10/23/20  Leandrew Koyanagi, MD  promethazine (PHENERGAN) 25 MG tablet Take 1 tablet (25 mg total) by mouth every 6 (six) hours as needed for nausea. Patient not taking: Reported on 11/20/2017 07/16/17 10/23/20  Leandrew Koyanagi, MD      Allergies    Paxil [paroxetine]    Review of Systems   Review of Systems  Cardiovascular:  Negative for chest pain.  Neurological:  Positive for headaches. Negative for dizziness.  Psychiatric/Behavioral:  Positive for behavioral problems.        Homicidal ideation    Physical Exam Updated Vital Signs BP 128/82 (BP Location: Right Arm)   Pulse 90   Temp 98.2 F (36.8 C) (Oral)   Resp 18   Ht 5\' 6"  (1.676 m)   Wt 90.7 kg   SpO2 96%   BMI 32.28 kg/m  Physical Exam Vitals and nursing note reviewed.  Constitutional:      General: He is not in acute distress.  Appearance: Normal appearance. He is not ill-appearing.  HENT:     Head: Normocephalic and atraumatic.  Eyes:     General: No scleral icterus.    Conjunctiva/sclera: Conjunctivae normal.     Pupils: Pupils are equal, round, and reactive to light.  Cardiovascular:     Rate and Rhythm: Normal rate and regular rhythm.  Pulmonary:     Effort: Pulmonary effort is normal. No respiratory distress.  Musculoskeletal:     Right lower leg: No edema.     Left lower leg: No edema.  Skin:    Findings: No rash.  Neurological:     Mental Status: He is alert.  Psychiatric:     Comments: Patient's speech is tangential however he is redirectable     ED Results / Procedures / Treatments   Labs (all labs  ordered are listed, but only abnormal results are displayed) Labs Reviewed  BASIC METABOLIC PANEL - Abnormal; Notable for the following components:      Result Value   Glucose, Bld 104 (*)    All other components within normal limits  CBC - Abnormal; Notable for the following components:   RBC 4.14 (*)    HCT 38.2 (*)    All other components within normal limits  SALICYLATE LEVEL - Abnormal; Notable for the following components:   Salicylate Lvl [id number] (*)    All other components within normal limits  ACETAMINOPHEN LEVEL - Abnormal; Notable for the following components:   Acetaminophen (Tylenol), Serum <10 (*)    All other components within normal limits  CBG MONITORING, ED - Abnormal; Notable for the following components:   Glucose-Capillary 100 (*)    All other components within normal limits  RESP PANEL BY RT-PCR (RSV, FLU A&B, COVID)  [id number]  ETHANOL  RAPID URINE DRUG SCREEN, HOSP PERFORMED  TROPONIN I (HIGH SENSITIVITY)    EKG EKG Interpretation  Date/Time:  Friday August 23 2022 14:19:01 EDT Ventricular Rate:  90 PR Interval:  148 QRS Duration: 80 QT Interval:  398 QTC Calculation: 486 R Axis:   38 Text Interpretation: Normal sinus rhythm Prolonged QT  no significant change since feb 2024 Confirmed by Sherwood Gambler [phone]) on 08/23/2022 3:02:14 PM  Radiology CT Head Wo Contrast  Result Date: 08/23/2022 CLINICAL DATA:  Head trauma, moderate-severe EXAM: CT HEAD WITHOUT CONTRAST TECHNIQUE: Contiguous axial images were obtained from the base of the skull through the vertex without intravenous contrast. RADIATION DOSE REDUCTION: This exam was performed according to the departmental dose-optimization program which includes automated exposure control, adjustment of the mA and/or kV according to patient size and/or use of iterative reconstruction technique. COMPARISON:  CT head October 21, 23. FINDINGS: Brain: No evidence of acute infarction, hemorrhage, hydrocephalus,  extra-axial collection or mass lesion/mass effect. Patchy white matter hypodensities, likely related to chronic microvascular ischemic change. Vascular: No hyperdense vessel identified. Skull: No acute fracture. Sinuses/Orbits: Chronic deformity and defect in the imaged right posterior maxillary sinus wall. Extensive paranasal sinus disease, greatest in the maxillary sinuses. Other: No mastoid effusions. IMPRESSION: 1. No evidence of acute intracranial abnormality is 2. Paranasal sinus disease. Correlate with signs/symptoms of sinusitis. Electronically Signed   By: Margaretha Sheffield M.D.   On: 08/23/2022 15:56   DG Chest Portable 1 View  Result Date: 08/23/2022 CLINICAL DATA:  Shortness of breath EXAM: PORTABLE CHEST 1 VIEW COMPARISON:  X-ray 07/12/2022 FINDINGS: No consolidation, pneumothorax or effusion. No edema. Normal cardiopericardial silhouette. Tortuous and ectatic aorta. IMPRESSION: No acute cardiopulmonary disease Electronically  Signed   By: Jill Side M.D.   On: 08/23/2022 15:31    Procedures Procedures   Medications Ordered in ED Medications - No data to display  ED Course/ Medical Decision Making/ A&P                             Medical Decision Making Amount and/or Complexity of Data Reviewed Labs: ordered. Radiology: ordered.   62 year old male presenting today with homicidal ideations.  Also intermittently complains of head pressure and he told nursing he had chest discomfort.  There is a broad differential for all of these complaints.  Will pursue broad workup.  Past Medical History / Co-morbidities / Social History: Repeated atypical chest pain, chronic back pain, polysubstance use disorder, most commonly cocaine and a bipolar disorder with psychotic features   Additional history: Per chart review patient has a history of chronic back pain.  Also has bipolar disorder with psychotic features and substance use disorder.  Is prescribed Prozac but no other antipsychotics    Physical Exam: Pertinent physical exam findings include Patient is disheveled and tangential.  Redirectable.  Continues to rant about being poisoned RRR.   Difficulty focusing on neuroexam however no obvious deficits  Lab Tests: I ordered, and personally interpreted labs.  The pertinent results include: Normal troponin Normal blood counts   Imaging Studies: I ordered and independently visualized and interpreted CT head and I agree with the radiologist that likely has sinusitis   MDM/Disposition: This is a 62 year old male who presented today with concern for homicidal ideation.  He also intermittently complained of a headache that felt dull.  He is ambulatory and from what neurologic exam I was able to get, has no focal signs or symptoms.  CT scan negative for CVA.  Chest pain workup was initiated by nursing because he did complain of chest pain to them.  This is negative.  I do suspect the majority of this to be secondary to a psychiatric illness.  At this time patient is medically cleared for psych evaluation!!!!!    Final Clinical Impression(s) / ED Diagnoses Final diagnoses:  Homicidal ideation  Suicidal ideation  Atypical chest pain  Pressure in head    Rx / DC Orders ED Discharge Orders     None      Medically cleared for psych evaluation   Rhae Hammock, PA-C 08/23/22 1745    Sherwood Gambler, MD 08/26/22 1755

## 2022-08-23 NOTE — ED Notes (Signed)
ED Provider at bedside. 

## 2022-08-23 NOTE — ED Notes (Signed)
Patient IVC'd 08/23/2022. EXPIRES 08/29/2021. 3 copies attached to clipboard at [facility] nurses' station. Original copy in folder for magistrate. Copy with patient labels in medical records.

## 2022-08-23 NOTE — ED Notes (Signed)
IVC paperwork in progress. Affidavit and first exam completed by Dr. Regenia Skeeter.

## 2022-08-23 NOTE — ED Notes (Signed)
TTS talking to pt now

## 2022-08-23 NOTE — ED Notes (Addendum)
Dark green top sent downstairs.

## 2022-08-23 NOTE — Progress Notes (Signed)
Pt was accepted to [facility] 08/23/22; Address: [address], [place], [place] [address]  Pt meets criteria per Merlyn Lot, NP  Attending Physician will be Dr. Dwyane Dee  Report can be called to: - [phone]  Pt can arrive: [place] Team notified: [facility] Clayborne Dana, RN, [facility] Edmonia James, RN, [place], RN, Sammuel Bailiff, RN, Lily Kocher, LPN, Leandro Reasoner, RN, Sherwood Gambler, MD, Merlyn Lot, NP  Nadara Mode, LCSWA 08/23/2022 @ 9:22 PM

## 2022-08-23 NOTE — ED Triage Notes (Signed)
Pt to ED via triage c/o chest pain and dizziness x 1 day, reports intermittent in nature. Denies alleviating or aggravating factors. Denies SHOB. No s/s of acute distress noted in triage.

## 2022-08-23 NOTE — Consult Note (Signed)
Telepsych Consultation   Reason for Consult:  Telepsych Assessment for  Referring Physician:  Rhae Hammock, PA-C  Location of Patient:    [facility] Location of Provider: Other: virtual home office  Patient Identification: Charles Estes MRN:  [id number] Principal Diagnosis: Bipolar disorder, curr episode depressed, severe, w/psychotic features ([place]) Diagnosis:  Principal Problem:   Bipolar disorder, curr episode depressed, severe, w/psychotic features ([place]) Active Problems:   Marijuana abuse   Post traumatic stress disorder (PTSD)   Cocaine dependence ([place])   Total Time spent with patient: 30 minutes  Subjective:   Charles Estes is a 62 y.o. male patient admitted with  Per ED Triage Nurse Note 08/23/2022@1422 : Pt to ED via triage c/o chest pain and dizziness x 1 day, reports intermittent in nature. Denies alleviating or aggravating factors. Denies SHOB. No s/s of acute distress noted in triage   HPI:   Patient seen via telepsych by this provider; chart reviewed and consulted with Dr. Dwyane Dee on 08/23/22.  On evaluation Charles Estes reports "I'm mad because the people in [place], they think I'm dead, they pausing me and then they moved my vehicle also."  He Patient is anxious and a poor historian.  His story is disorganized but when asked if he had homicidal ideations he states, "yeah, I want to kills everyone in the house."   Patient reports he has a gun in his truck that he can use to kill others or he states he can set the house on fire.  It is unclear who his HI is projected towards or what triggered him. Pt appears agitated and speaking in a loud voice but respectful and cooperative with this Probation officer.    Pt reports he's currently homeless and has been sleeping out of his truck. He does not elaborate on what led to him being homeless.  He states during the day his daughter allows him to come to her house to shower and care for himself but does not allow him to stay,"because she  said, I'm on the street and that's not good."  Pt reports a history for mental illness but does not state his diagnosis.  He also reports a familial hx for mental illness, states everyone takes meds but again does not disclose diagnosis.   Pt reports he's supposed to take "haldol and abilify" but he stopped d/t intolerable side effects and reports he's been self medicating with cocaine,  and marijuana.  He denies other illicit drugs but states someone tricked him into taking fentanyl.    Labs:  LFTs WNL; WBC- no leukocytosis seen UDS is positive for cocaine and marijuana EKG: QT/QTC intervals -398/496 prolonged QT intervals  During evaluation Charles Estes is laying in bed, supine positive and facing the camera.  he is alert/oriented to name, place but unable to provide date or time. He presentation is dysphoric but he is cooperative; and mood congruent with affect.  Patient is speaking in a clear tone at loud volume, and normal pace; with good eye contact.  His thought process is of linear; There is no indication that he is currently responding to internal/external stimuli or experiencing delusional thought content.  Patient denies suicidal ideation but endorses homicidal ideation towards unknown individuals in [place].  Patient has remained cooperative and has answered questions appropriately.    Per ED Provider Admission Assessment 08/23/2022: History      Chief Complaint  Patient presents with   Chest Pain   Dizziness   Suicidal  Charles Estes is a 62 y.o. male medical history of polysubstance use disorder and chronic back problems presenting today due to homicidal ideation.  When I walk in the room patient is on the phone with a 911 operator.  He tells" I am been to kill everybody in that house and then myself."  Denies any AVH.  Does say that he "drinks little alcohol and somebody is poisoning him with fentanyl."  Reports last fentanyl poisoning to be on Tuesday.   He also mentions  a headache and "feeling weird."  Says that it is interfering with his thoughts of homicide.  Says that it feels like pressure all over his head.  No visual disturbance.   Does not mention chest pain, shortness of breath or palpitations to me as he did to nursing staff.    Past Psychiatric History: polysubstance abuse  Risk to Self:  no Risk to Others:  yes Prior Inpatient Therapy: yes  Prior Outpatient Therapy:  yes   Past Medical History:  Past Medical History:  Diagnosis Date   Depression    GERD (gastroesophageal reflux disease)    Hypertension    Pathologic ulnar fracture with malunion    right    Past Surgical History:  Procedure Laterality Date   MANDIBLE FRACTURE SURGERY  2014   ORIF ULNAR FRACTURE Right 07/16/2017   Procedure: OPEN REDUCTION INTERNAL FIXATION (ORIF) RIGHT ULNA FRACTURE NONUNION;  Surgeon: Leandrew Koyanagi, MD;  Location: [facility];  Service: Orthopedics;  Laterality: Right;   Family History:  Family History  Problem Relation Age of Onset   Hypertension Mother    Heart attack Mother    Hypertension Father    Heart attack Father    Heart attack Sister    Hypertension Sister    Heart attack Sister    Hypertension Brother    Family Psychiatric  History: "they are all crazy and take medications." Social History:  Social History   Substance and Sexual Activity  Alcohol Use Yes   Comment: socially- past weekend reports 10 beers     Social History   Substance and Sexual Activity  Drug Use Yes   Types: Cocaine   Comment: occasionally    Social History   Socioeconomic History   Marital status: Single    Spouse name: Not on file   Number of children: Not on file   Years of education: Not on file   Highest education level: Not on file  Occupational History   Not on file  Tobacco Use   Smoking status: Never   Smokeless tobacco: Never  Substance and Sexual Activity   Alcohol use: Yes    Comment: socially- past weekend reports  10 beers   Drug use: Yes    Types: Cocaine    Comment: occasionally   Sexual activity: Not on file  Other Topics Concern   Not on file  Social History Narrative   Not on file   Social Determinants of Health   Financial Resource Strain: Not on file  Food Insecurity: Not on file  Transportation Needs: Not on file  Physical Activity: Not on file  Stress: Not on file  Social Connections: Not on file   Additional Social History:    Allergies:   Allergies  Allergen Reactions   Paxil [Paroxetine] Diarrhea and Other (See Comments)    Mouth sores    Labs:  Results for orders placed or performed during the hospital encounter of 08/23/22 (from the past 48 hour(s))  CBG monitoring, ED     Status: Abnormal   Collection Time: 08/23/22  2:25 PM  Result Value Ref Range   Glucose-Capillary 100 (H) 70 - 99 mg/dL    Comment: Glucose reference range applies only to samples taken after fasting for at least 8 hours.  Basic metabolic panel     Status: Abnormal   Collection Time: 08/23/22  2:26 PM  Result Value Ref Range   Sodium 138 135 - 145 mmol/L   Potassium 4.0 3.5 - 5.1 mmol/L    Comment: HEMOLYSIS AT THIS LEVEL MAY AFFECT RESULT   Chloride 105 98 - 111 mmol/L   CO2 23 22 - 32 mmol/L   Glucose, Bld 104 (H) 70 - 99 mg/dL    Comment: Glucose reference range applies only to samples taken after fasting for at least 8 hours.   BUN 15 6 - 20 mg/dL   Creatinine, Ser 1.20 0.61 - 1.24 mg/dL   Calcium 9.1 8.9 - 10.3 mg/dL   GFR, Estimated >60 >60 mL/min    Comment: (NOTE) Calculated using the CKD-EPI Creatinine Equation (2021)    Anion gap 10 5 - 15    Comment: Performed at [facility] [address], [place], [place] [address]  CBC     Status: Abnormal   Collection Time: 08/23/22  2:26 PM  Result Value Ref Range   WBC 8.2 4.0 - 10.5 K/uL   RBC 4.14 (L) 4.22 - 5.81 MIL/uL   Hemoglobin 13.1 13.0 - 17.0 g/dL   HCT 38.2 (L) 39.0 - 52.0 %   MCV 92.3 80.0 - 100.0 fL   MCH 31.6  26.0 - 34.0 pg   MCHC 34.3 30.0 - 36.0 g/dL   RDW 13.1 11.5 - 15.5 %   Platelets 281 150 - 400 K/uL   nRBC 0.0 0.0 - 0.2 %    Comment: Performed at [facility], [place] [address], [place], [place] [address]  Troponin I (High Sensitivity)     Status: None   Collection Time: 08/23/22  2:26 PM  Result Value Ref Range   Troponin I (High Sensitivity) 5 <18 ng/L    Comment: (NOTE) Elevated high sensitivity troponin I (hsTnI) values and significant  changes across serial measurements may suggest ACS but many other  chronic and acute conditions are known to elevate hsTnI results.  Refer to the "Links" section for chest pain algorithms and additional  guidance. Performed at [facility], [place] [address], [place], [place] [address]   Resp panel by RT-PCR (RSV, Flu A&B, Covid) Anterior Nasal Swab     Status: None   Collection Time: 08/23/22  3:13 PM   Specimen: Anterior Nasal Swab  Result Value Ref Range   SARS Coronavirus 2 by RT PCR NEGATIVE NEGATIVE   Influenza A by PCR NEGATIVE NEGATIVE   Influenza B by PCR NEGATIVE NEGATIVE    Comment: (NOTE) The Xpert Xpress SARS-CoV-2/FLU/RSV plus assay is intended as an aid in the diagnosis of influenza from Nasopharyngeal swab specimens and should not be used as a sole basis for treatment. Nasal washings and aspirates are unacceptable for Xpert Xpress SARS-CoV-2/FLU/RSV testing.  Fact Sheet for Patients: EntrepreneurPulse.com.au  Fact Sheet for Healthcare Providers: IncredibleEmployment.be  This test is not yet approved or cleared by the [place] FDA and has been authorized for detection and/or diagnosis of SARS-CoV-2 by FDA under an Emergency Use Authorization (EUA). This EUA will remain in effect (meaning this test can be used)  for the duration of the COVID-19 declaration under Section 564(b)(1) of the Act, 21 U.S.C. section 360bbb-3(b)(1), unless the authorization is terminated  or revoked.     Resp Syncytial Virus by PCR NEGATIVE NEGATIVE    Comment: (NOTE) Fact Sheet for Patients: EntrepreneurPulse.com.au  Fact Sheet for Healthcare Providers: IncredibleEmployment.be  This test is not yet approved or cleared by the [place] FDA and has been authorized for detection and/or diagnosis of SARS-CoV-2 by FDA under an Emergency Use Authorization (EUA). This EUA will remain in effect (meaning this test can be used) for the duration of the COVID-19 declaration under Section 564(b)(1) of the Act, 21 U.S.C. section 360bbb-3(b)(1), unless the authorization is terminated or revoked.  Performed at [facility], [place] [address] [place], [place] [address]   Ethanol     Status: None   Collection Time: 08/23/22  4:38 PM  Result Value Ref Range   Alcohol, Ethyl (B) <10 <10 mg/dL    Comment: (NOTE) Lowest detectable limit for serum alcohol is 10 mg/dL.  For medical purposes only. Performed at [facility], [place] [address], [place], [place] [id number]   Salicylate level     Status: Abnormal   Collection Time: 08/23/22  4:38 PM  Result Value Ref Range   Salicylate Lvl Q000111Q (L) 7.0 - 30.0 mg/dL    Comment: Performed at [facility] [address], [place], [place] [address]  Acetaminophen level     Status: Abnormal   Collection Time: 08/23/22  4:38 PM  Result Value Ref Range   Acetaminophen (Tylenol), Serum <10 (L) 10 - 30 ug/mL    Comment: (NOTE) Therapeutic concentrations vary significantly. A range of 10-30 ug/mL  may be an effective concentration for many patients. However, some  are best treated at concentrations outside of this range. Acetaminophen concentrations >150 ug/mL at 4 hours after ingestion  and >50 ug/mL at 12 hours after ingestion are often associated with  toxic reactions.  Performed at [facility], [place] [address], [place], [place] [address]   Rapid urine drug screen (hospital  performed)     Status: Abnormal   Collection Time: 08/23/22  5:21 PM  Result Value Ref Range   Opiates NONE DETECTED NONE DETECTED   Cocaine POSITIVE (A) NONE DETECTED   Benzodiazepines NONE DETECTED NONE DETECTED   Amphetamines NONE DETECTED NONE DETECTED   Tetrahydrocannabinol NONE DETECTED NONE DETECTED   Barbiturates NONE DETECTED NONE DETECTED    Comment: (NOTE) DRUG SCREEN FOR MEDICAL PURPOSES ONLY.  IF CONFIRMATION IS NEEDED FOR ANY PURPOSE, NOTIFY LAB WITHIN 5 DAYS.  LOWEST DETECTABLE LIMITS FOR URINE DRUG SCREEN Drug Class                     Cutoff (ng/mL) Amphetamine and metabolites    1000 Barbiturate and metabolites    200 Benzodiazepine                 200 Opiates and metabolites        300 Cocaine and metabolites        300 THC                            50 Performed at [facility], [place] [address] [place],  [address]     Medications:  Current Facility-Administered Medications  Medication Dose Route Frequency Provider Last Rate Last Admin   aspirin EC tablet  81 mg  81 mg Oral Daily Merlyn Lot E, NP       FLUoxetine (PROZAC) capsule 10 mg  10 mg Oral Daily Merlyn Lot E, NP       gabapentin (NEURONTIN) capsule 200 mg  200 mg Oral TID Mallie Darting, NP       pantoprazole (PROTONIX) EC tablet 40 mg  40 mg Oral Daily Merlyn Lot E, NP       Current Outpatient Medications  Medication Sig Dispense Refill   acetaminophen (TYLENOL) 500 MG tablet Take 500-1,000 mg by mouth daily as needed for mild pain or headache.     amLODipine (NORVASC) 10 MG tablet Take 1 tablet (10 mg total) by mouth daily. 30 tablet 0   aspirin EC 81 MG tablet Take 81 mg by mouth daily.     Cholecalciferol (VITAMIN D3) 125 MCG (5000 UT) capsule Take 1 capsule (5,000 Units total) by mouth daily with breakfast. Take along with calcium and magnesium. 90 capsule 0   ergocalciferol (VITAMIN D2) 1.25 MG (50000 UT) capsule Take 1 capsule (50,000 Units total) by mouth 2 (two)  times a week. X 6 weeks. 12 capsule 0   FLUoxetine (PROZAC) 10 MG capsule Take 1 capsule (10 mg total) by mouth daily. 30 capsule 0   pantoprazole (PROTONIX) 40 MG tablet Take 1 tablet (40 mg total) by mouth daily. 30 tablet 0    Musculoskeletal: pt moves all extremities and ambulates independently.  Strength & Muscle Tone: within normal limits Gait & Station: normal Patient leans: N/A   Psychiatric Specialty Exam:  Presentation  General Appearance:  Bizarre; Fairly Groomed (laying on hosptial gurney, head elevated, talking loud and very disorganized)  Eye Contact: Good  Speech: Pressured  Speech Volume: Increased  Handedness: Right   Mood and Affect  Mood: Anxious; Depressed; Dysphoric  Affect: Congruent   Thought Process  Thought Processes: Disorganized  Descriptions of Associations:Circumstantial  Orientation:Partial  Thought Content:Illogical  History of Schizophrenia/Schizoaffective disorder:Yes  Duration of Psychotic Symptoms:No data recorded Hallucinations:Hallucinations: None  Ideas of Reference:None  Suicidal Thoughts:Suicidal Thoughts: No  Homicidal Thoughts:Homicidal Thoughts: Yes, Active HI Active Intent and/or Plan: With Intent; With Plan; With Means to Carry Out; With Access to Means   Sensorium  Memory: Immediate Fair; Recent Fair; Remote Fair  Judgment: Poor  Insight: Poor   Executive Functions  Concentration: Poor  Attention Span: Poor  Recall: Poor  Fund of Knowledge: Fair  Language: Fair   Psychomotor Activity  Psychomotor Activity:Psychomotor Activity: Increased   Assets  Assets: Communication Skills; Desire for Improvement   Sleep  Sleep:Sleep: Fair Number of Hours of Sleep: 5    Physical Exam: Physical Exam Vitals and nursing note reviewed.  Constitutional:      Appearance: He is obese.  Cardiovascular:     Rate and Rhythm: Normal rate.     Pulses: Normal pulses.  Pulmonary:      Effort: Pulmonary effort is normal.  Musculoskeletal:        General: Normal range of motion.     Cervical back: Normal range of motion.  Neurological:     Mental Status: He is alert.  Psychiatric:        Attention and Perception: Attention normal.        Mood and Affect: Mood is anxious. Affect is blunt.        Speech: Speech is rapid and pressured.        Behavior: Behavior is hyperactive. Behavior is cooperative.  Thought Content: Thought content includes homicidal ideation. Thought content includes homicidal plan.        Cognition and Memory: He exhibits impaired recent memory (may be d/t illicit drug use or untreated bipolar disorder).        Judgment: Judgment is impulsive and inappropriate.    Review of Systems  Constitutional: Negative.   HENT: Negative.    Eyes: Negative.   Respiratory: Negative.    Cardiovascular: Negative.   Gastrointestinal: Negative.   Genitourinary: Negative.   Musculoskeletal:  Positive for back pain (chronic, but denies new or worse sx today).  Skin: Negative.   Neurological: Negative.   Endo/Heme/Allergies: Negative.   Psychiatric/Behavioral:  Positive for substance abuse. The patient is nervous/anxious.    Blood pressure 126/86, pulse 80, temperature 98.2 F (36.8 C), temperature source Oral, resp. rate 18, height 5\' 6"  (1.676 m), weight 90.7 kg, SpO2 98 %. Body mass index is 32.28 kg/m.  Treatment Plan Summary: Pt care discussed with Dr. Hampton Abbot.  Pt presents with homicidal ideations towards unidentified individuals in [place].  He endorses a plan to burn their house down or use a firearm that he reports is in his truck that's parked in the hospital lobby.  Pt is a poor historian so it's unclear that triggered him.  His presentation is complicated by Bipolar Disorder and psychotropic medication adherence; and polysubstance abuse.  His admission UDS was + for marijuana and cocaine which he endorses he uses to self medicate.     Recommend restarting mental health medications and overnight observation where he can be monitored for safety and mood stability.  He can be evaluated by psychiatry in the AM when the effects of illicit substance(s) have worn off.  Pt is appropriate for [place].   Pt' s EKG demonstrates prolonged QT intervals given this, will recommend the following: Medications:   Gabapentin 200mg  po TID for anxiety Restart home medications.    Disposition: No evidence of imminent risk to self or others at present.   Patient does not meet criteria for psychiatric inpatient admission. Supportive therapy provided about ongoing stressors.  This service was provided via telemedicine using a 2-way, interactive audio and video technology.  Names of all persons participating in this telemedicine service and their role in this encounter. Name: Charles Estes Role: Patient  Name: Merlyn Lot Role: [place]  Name: Hampton Abbot Role: Psychiatrist   Mallie Darting, NP 08/23/2022 7:34 PM

## 2022-08-23 NOTE — ED Notes (Signed)
Pt requested something to eat, writer provided pt with a sandwich bag and pple juice.

## 2022-08-23 NOTE — ED Notes (Signed)
Pt changed into purple scrubs, belongings placed in belongings bag and put in locker #3; pt calm and cooperative at present

## 2022-08-24 DIAGNOSIS — F315 Bipolar disorder, current episode depressed, severe, with psychotic features: Secondary | ICD-10-CM | POA: Diagnosis not present

## 2022-08-24 DIAGNOSIS — F23 Brief psychotic disorder: Secondary | ICD-10-CM | POA: Diagnosis present

## 2022-08-24 MED ORDER — OLANZAPINE 5 MG PO TBDP
5.0000 mg | ORAL_TABLET | Freq: Two times a day (BID) | ORAL | Status: DC
  Administered 2022-08-24 – 2022-08-25 (×2): 5 mg via ORAL
  Filled 2022-08-24 (×4): qty 1

## 2022-08-24 MED ORDER — LORAZEPAM 1 MG PO TABS: 1.0000 mg | ORAL_TABLET | ORAL | Status: DC | PRN

## 2022-08-24 MED ORDER — ZIPRASIDONE MESYLATE 20 MG IM SOLR: 20.0000 mg | INTRAMUSCULAR | Status: DC | PRN

## 2022-08-24 MED ORDER — OLANZAPINE 10 MG PO TBDP: 10.0000 mg | ORAL_TABLET | Freq: Three times a day (TID) | ORAL | Status: DC | PRN

## 2022-08-24 NOTE — ED Notes (Signed)
Pt sleeping@this time. Breathing even and unlabored. Will continue to monitor for safety 

## 2022-08-24 NOTE — ED Notes (Signed)
Pt sleeping at present, no distress noted.  Monitoring for safety. 

## 2022-08-24 NOTE — ED Provider Notes (Signed)
[facility] Continuous Assessment Admission H&P  Date: 08/24/22 Patient Name: Charles Estes MRN: [id number] Chief Complaint: Homicidal ideation  Diagnoses:  Final diagnoses:  Bipolar disorder, current episode depressed, severe, with psychotic features [facility])    HPI: Charles Estes is a 62 y/o male with a history of bipolar disorder, marijuana abuse, posttraumatic stress disorder and cocaine dependence presenting to [facility] under IVC with homicidal ideations and suicidal ideations.  Petition reviewed patient presenting complaining of homicidal ideations,  "repeats that he will kill everyone in the house".  States "he will kill himself after he states the homicidal will be because they are poisoning him with fentanyl ".  On assessment "patient stated that they are trying to kill me" patient stated the people who gave him fentanyl.  Patient reports that he lives with his daughter but his daughter does not like it when he stated out past 10 PM and they would get into arguments about her using drugs.  Patient states that he uses cocaine about $80 worth he last used on Wednesday and he drinks alcohol occasionally.  Patient states that he wants to kill the people that put fentanyl in his drug because they are trying to kill him.  Patient also endorses being suicidal and states that he keeps a gun in his truck and that he thought about suicide by cop by using a hammer to threaten cuts and the hopes that they shoot him. [facility] with homicidal ideation and was IVC by EDP Sherwood Gambler.  Patient was seen and evaluated by Merlyn Lot, NP and recommended for admission to [facility] continuous assessment to be monitored safety and mood stability.   Shnese Mills-NP   On evaluation Charles Estes reports "I'm mad because the people in [place], they think I'm dead, they pausing me and then they moved my vehicle also."  He Patient is anxious and a poor historian.  His story is  disorganized but when asked if he had homicidal ideations he states, "yeah, I want to kills everyone in the house."   Patient reports he has a gun in his truck that he can use to kill others or he states he can set the house on fire.  It is unclear who his HI is projected towards or what triggered him. Pt appears agitated and speaking in a loud voice but respectful and cooperative with this Probation officer. Pt reports he's currently homeless and has been sleeping out of his truck. He does not elaborate on what led to him being homeless. He states during the day his daughter allows him to come to her house to shower and care for himself but does not allow him to stay,"because she said, I'm on the street and that's not good."    Total Time spent with patient: 30 minutes  Musculoskeletal  Strength & Muscle Tone: within normal limits Gait & Station: normal Patient leans: N/A  Psychiatric Specialty Exam  Presentation General Appearance:  Disheveled  Eye Contact: Good  Speech: Clear and Coherent  Speech Volume: Normal  Handedness: Right   Mood and Affect  Mood: Anxious; Depressed  Affect: Appropriate   Thought Process  Thought Processes: Coherent  Descriptions of Associations:Circumstantial  Orientation:Full (Time, Place and Person)  Thought Content:Illogical  Diagnosis of Schizophrenia or Schizoaffective disorder in past: Yes   Hallucinations:Hallucinations: None  Ideas of Reference:None  Suicidal Thoughts:Suicidal Thoughts: Yes, Active SI Active Intent and/or Plan: With Intent; With Plan  Homicidal Thoughts:Homicidal Thoughts: Yes, Active  HI Active Intent and/or Plan: With Intent; With Plan   Sensorium  Memory: Immediate Fair; Recent Fair; Remote Fair  Judgment: Poor  Insight: Poor   Executive Functions  Concentration: Poor  Attention Span: Fair  Recall: [employer] of Knowledge: Fair  Language: Fair   Psychomotor Activity  Psychomotor  Activity: Psychomotor Activity: Normal   Assets  Assets: Desire for Improvement; Communication Skills   Sleep  Sleep: Sleep: Fair Number of Hours of Sleep: 5   Nutritional Assessment (For OBS and FBC admissions only) Has the patient had a weight loss or gain of 10 pounds or more in the last 3 months?: No Has the patient had a decrease in food intake/or appetite?: No Does the patient have dental problems?: No Does the patient have eating habits or behaviors that may be indicators of an eating disorder including binging or inducing vomiting?: No Has the patient recently lost weight without trying?: 0 Has the patient been eating poorly because of a decreased appetite?: 0 Malnutrition Screening Tool Score: 0    Physical Exam HENT:     Head: Normocephalic and atraumatic.     Nose: Nose normal.  Eyes:     Pupils: Pupils are equal, round, and reactive to light.  Cardiovascular:     Rate and Rhythm: Normal rate.  Pulmonary:     Effort: Pulmonary effort is normal.  Abdominal:     General: Abdomen is flat.  Musculoskeletal:        General: Normal range of motion.     Cervical back: Normal range of motion.  Skin:    General: Skin is warm.  Neurological:     Mental Status: He is alert and oriented to person, place, and time.  Psychiatric:        Attention and Perception: Attention normal.        Mood and Affect: Mood is anxious and depressed. Affect is labile.        Speech: Speech normal.        Behavior: Behavior is agitated. Behavior is cooperative.        Thought Content: Thought content includes homicidal and suicidal ideation. Thought content includes homicidal and suicidal plan.        Cognition and Memory: Cognition normal.        Judgment: Judgment is impulsive.    Review of Systems  Constitutional: Negative.   HENT: Negative.    Eyes: Negative.   Respiratory: Negative.    Cardiovascular: Negative.   Gastrointestinal: Negative.   Genitourinary: Negative.    Musculoskeletal: Negative.   Skin: Negative.   Neurological: Negative.   Endo/Heme/Allergies: Negative.   Psychiatric/Behavioral:  Positive for depression, substance abuse and suicidal ideas. The patient is nervous/anxious.     Blood pressure (!) 126/96, pulse 70, temperature 99.2 F (37.3 C), temperature source Oral, resp. rate 18, SpO2 97 %. There is no height or weight on file to calculate BMI.  Past Psychiatric History: Polysubstance abuse.  Is the patient at risk to self? Yes  Has the patient been a risk to self in the past 6 months? Yes .    Has the patient been a risk to self within the distant past? Yes   Is the patient a risk to others? Yes   Has the patient been a risk to others in the past 6 months? Yes   Has the patient been a risk to others within the distant past? Yes   Past Medical History:  Depression  GERD (gastroesophageal reflux disease)     Hypertension     Pathologic ulnar fracture with malunion      right    Family History: Hypertension Mother      Heart attack Mother     Hypertension Father     Heart attack Father     Heart attack Sister     Hypertension Sister     Heart attack Sister     Hypertension Brother     Social History:  Last Labs:  Admission on 08/23/2022  Component Date Value Ref Range Status   Cholesterol 08/23/2022 199  0 - 200 mg/dL Final   Triglycerides 08/23/2022 69  <150 mg/dL Final   HDL 08/23/2022 43  >40 mg/dL Final   Total CHOL/HDL Ratio 08/23/2022 4.6  RATIO Final   VLDL 08/23/2022 14  0 - 40 mg/dL Final   LDL Cholesterol 08/23/2022 142 (H)  0 - 99 mg/dL Final   Comment:        Total Cholesterol/HDL:CHD Risk Coronary Heart Disease Risk Table                     Men   Women  1/2 Average Risk   3.4   3.3  Average Risk       5.0   4.4  2 X Average Risk   9.6   7.1  3 X Average Risk  23.4   11.0        Use the calculated Patient Ratio above and the CHD Risk Table to determine the patient's CHD Risk.         ATP III CLASSIFICATION (LDL):  <100     mg/dL   Optimal  100-129  mg/dL   Near or Above                    Optimal  130-159  mg/dL   Borderline  160-189  mg/dL   High  >190     mg/dL   Very High Performed at [facility] [address], [place], [place] [address]   Admission on 08/23/2022, Discharged on 08/23/2022  Component Date Value Ref Range Status   Sodium 08/23/2022 138  135 - 145 mmol/L Final   Potassium 08/23/2022 4.0  3.5 - 5.1 mmol/L Final   HEMOLYSIS AT THIS LEVEL MAY AFFECT RESULT   Chloride 08/23/2022 105  98 - 111 mmol/L Final   CO2 08/23/2022 23  22 - 32 mmol/L Final   Glucose, Bld 08/23/2022 104 (H)  70 - 99 mg/dL Final   Glucose reference range applies only to samples taken after fasting for at least 8 hours.   BUN 08/23/2022 15  6 - 20 mg/dL Final   Creatinine, Ser 08/23/2022 1.20  0.61 - 1.24 mg/dL Final   Calcium 08/23/2022 9.1  8.9 - 10.3 mg/dL Final   GFR, Estimated 08/23/2022 >60  >60 mL/min Final   Comment: (NOTE) Calculated using the CKD-EPI Creatinine Equation (2021)    Anion gap 08/23/2022 10  5 - 15 Final   Performed at [facility], [place] [address] [place] [address], [place], [place] [address]   WBC 08/23/2022 8.2  4.0 - 10.5 K/uL Final   RBC 08/23/2022 4.14 (L)  4.22 - 5.81 MIL/uL Final   Hemoglobin 08/23/2022 13.1  13.0 - 17.0 g/dL Final   HCT 08/23/2022 38.2 (L)  39.0 - 52.0 % Final   MCV 08/23/2022 92.3  80.0 - 100.0 fL Final   MCH  08/23/2022 31.6  26.0 - 34.0 pg Final   MCHC 08/23/2022 34.3  30.0 - 36.0 g/dL Final   RDW 08/23/2022 13.1  11.5 - 15.5 % Final   Platelets 08/23/2022 281  150 - 400 K/uL Final   nRBC 08/23/2022 0.0  0.0 - 0.2 % Final   Performed at [facility], [place] [address] [place], [place] [address]   Troponin I (High Sensitivity) 08/23/2022 5  <18 ng/L Final   Comment: (NOTE) Elevated high sensitivity troponin I (hsTnI) values and significant  changes across serial measurements may suggest ACS but many other  chronic and  acute conditions are known to elevate hsTnI results.  Refer to the "Links" section for chest pain algorithms and additional  guidance. Performed at [facility], [place] [address], [place], [place] [address]    Glucose-Capillary 08/23/2022 100 (H)  70 - 99 mg/dL Final   Glucose reference range applies only to samples taken after fasting for at least 8 hours.   Alcohol, Ethyl (B) 08/23/2022 <10  <10 mg/dL Final   Comment: (NOTE) Lowest detectable limit for serum alcohol is 10 mg/dL.  For medical purposes only. Performed at [facility], [place] [address], [place], [place] [address]    Opiates 08/23/2022 NONE DETECTED  NONE DETECTED Final   Cocaine 08/23/2022 POSITIVE (A)  NONE DETECTED Final   Benzodiazepines 08/23/2022 NONE DETECTED  NONE DETECTED Final   Amphetamines 08/23/2022 NONE DETECTED  NONE DETECTED Final   Tetrahydrocannabinol 08/23/2022 NONE DETECTED  NONE DETECTED Final   Barbiturates 08/23/2022 NONE DETECTED  NONE DETECTED Final   Comment: (NOTE) DRUG SCREEN FOR MEDICAL PURPOSES ONLY.  IF CONFIRMATION IS NEEDED FOR ANY PURPOSE, NOTIFY LAB WITHIN 5 DAYS.  LOWEST DETECTABLE LIMITS FOR URINE DRUG SCREEN Drug Class                     Cutoff (ng/mL) Amphetamine and metabolites    1000 Barbiturate and metabolites    200 Benzodiazepine                 200 Opiates and metabolites        300 Cocaine and metabolites        300 THC                            50 Performed at [facility], [place] [address], [place], [place] [id number]    Salicylate Lvl 123XX123 <7.0 (L)  7.0 - 30.0 mg/dL Final   Performed at [facility] [address], [place], [place] [address]   Acetaminophen (Tylenol), Serum 08/23/2022 <10 (L)  10 - 30 ug/mL Final   Comment: (NOTE) Therapeutic concentrations vary significantly. A range of 10-30 ug/mL  may be an effective concentration for many patients. However, some  are best treated at concentrations outside of this  range. Acetaminophen concentrations >150 ug/mL at 4 hours after ingestion  and >50 ug/mL at 12 hours after ingestion are often associated with  toxic reactions.  Performed at [facility], [place] [address] [place], [place] [address]    SARS Coronavirus 2 by RT PCR 08/23/2022 NEGATIVE  NEGATIVE Final   Influenza A by PCR 08/23/2022 NEGATIVE  NEGATIVE Final   Influenza B by PCR 08/23/2022 NEGATIVE  NEGATIVE Final   Comment: (NOTE) The Xpert Xpress SARS-CoV-2/FLU/RSV plus assay is intended as an aid in the diagnosis of influenza from  Nasopharyngeal swab specimens and should not be used as a sole basis for treatment. Nasal washings and aspirates are unacceptable for Xpert Xpress SARS-CoV-2/FLU/RSV testing.  Fact Sheet for Patients: EntrepreneurPulse.com.au  Fact Sheet for Healthcare Providers: IncredibleEmployment.be  This test is not yet approved or cleared by the [place] FDA and has been authorized for detection and/or diagnosis of SARS-CoV-2 by FDA under an Emergency Use Authorization (EUA). This EUA will remain in effect (meaning this test can be used) for the duration of the COVID-19 declaration under Section 564(b)(1) of the Act, 21 U.S.C. section 360bbb-3(b)(1), unless the authorization is terminated or revoked.     Resp Syncytial Virus by PCR 08/23/2022 NEGATIVE  NEGATIVE Final   Comment: (NOTE) Fact Sheet for Patients: EntrepreneurPulse.com.au  Fact Sheet for Healthcare Providers: IncredibleEmployment.be  This test is not yet approved or cleared by the [place] FDA and has been authorized for detection and/or diagnosis of SARS-CoV-2 by FDA under an Emergency Use Authorization (EUA). This EUA will remain in effect (meaning this test can be used) for the duration of the COVID-19 declaration under Section 564(b)(1) of the Act, 21 U.S.C. section 360bbb-3(b)(1), unless the authorization  is terminated or revoked.  Performed at [facility], [place] [address], [place], [place] [address]   Office Visit on 07/24/2022  Component Date Value Ref Range Status   Summary 07/24/2022 Note   Final   Comment: ==================================================================== Compliance Drug Analysis, Ur ==================================================================== Test                             Result       Flag       Units  Drug Present not Declared for Prescription Verification   Benzoylecgonine                170          UNEXPECTED ng/mg creat    Benzoylecgonine is a metabolite of cocaine; its presence indicates    use of this drug.  Source is most commonly illicit, but cocaine is    present in some topical anesthetic solutions.    Naproxen                       PRESENT      UNEXPECTED  Drug Absent but Declared for Prescription Verification   Fluoxetine                     Not Detected UNEXPECTED   Aripiprazole                   Not Detected UNEXPECTED   Acetaminophen                  Not Detected UNEXPECTED    Acetaminophen, as indicated in the declared medication list, is not    always detected even when used as directed.    Salicylate                                               Not Detected UNEXPECTED    Aspirin, as indicated in the declared medication list, is not always    detected even when used as directed.    Promethazine  Not Detected UNEXPECTED ==================================================================== Test                      Result    Flag   Units      Ref Range   Creatinine              30               mg/dL      >=20 ==================================================================== Declared Medications:  The flagging and interpretation on this report are based on the  following declared medications.  Unexpected results may arise from  inaccuracies in the declared medications.   **Note: The testing scope  of this panel includes these medications:   Aripiprazole (Abilify)  Fluoxetine (Prozac)  Promethazine (Phenergan)   **Note: The testing scope of this panel does not include small to  moderate amounts of these reported medications:   Acetaminophen (Tylenol)  Aspirin   **Note: The testing sco                          pe of this panel does not include the  following reported medications:   Amlodipine (Norvasc)  Carvedilol (Coreg)  Hydralazine (Apresoline)  Pantoprazole (Protonix)  Pravastatin (Pravachol)  Tamsulosin (Flomax) ==================================================================== For clinical consultation, please call [phone]. ====================================================================    Glucose 07/24/2022 91  70 - 99 mg/dL Final   BUN 07/24/2022 11  6 - 24 mg/dL Final   Creatinine, Ser 07/24/2022 0.98  0.76 - 1.27 mg/dL Final   eGFR 07/24/2022 89  >59 mL/min/1.73 Final   BUN/Creatinine Ratio 07/24/2022 11  9 - 20 Final   Sodium 07/24/2022 142  134 - 144 mmol/L Final   Potassium 07/24/2022 4.5  3.5 - 5.2 mmol/L Final   Chloride 07/24/2022 106  96 - 106 mmol/L Final   Calcium 07/24/2022 9.5  8.7 - 10.2 mg/dL Final   Total Protein 07/24/2022 6.3  6.0 - 8.5 g/dL Final   Albumin 07/24/2022 4.0  3.8 - 4.9 g/dL Final   Globulin, Total 07/24/2022 2.3  1.5 - 4.5 g/dL Final   Albumin/Globulin Ratio 07/24/2022 1.7  1.2 - 2.2 Final   Bilirubin Total 07/24/2022 0.2  0.0 - 1.2 mg/dL Final   Alkaline Phosphatase 07/24/2022 72  44 - 121 IU/L Final   AST 07/24/2022 13  0 - 40 IU/L Final   Magnesium 07/24/2022 2.0  1.6 - 2.3 mg/dL Final   Vitamin B-12 07/24/2022 400  232 - 1,245 pg/mL Final   Sed Rate 07/24/2022 3  0 - 30 mm/hr Final   25-Hydroxy, Vitamin D 07/24/2022 12 (L)  ng/mL Final   Comment: Reference Range: All Ages: Target levels 30 - 100    25-Hydroxy, Vitamin D-2 07/24/2022 1.2  ng/mL Final   Comment: This test was developed and its performance  characteristics determined by [facility]. It has not been cleared or approved by the Food and Drug Administration.    25-Hydroxy, Vitamin D-3 07/24/2022 11  ng/mL Final   Comment: This test was developed and its performance characteristics determined by [facility]. It has not been cleared or approved by the Food and Drug Administration.    CRP 07/24/2022 <1  0 - 10 mg/L Final  Admission on 07/12/2022, Discharged on 07/12/2022  Component Date Value Ref Range Status   WBC 07/12/2022 9.0  4.0 - 10.5 K/uL Final   RBC 07/12/2022 4.04 (L)  4.22 - 5.81 MIL/uL Final   Hemoglobin 07/12/2022 12.9 (  L)  13.0 - 17.0 g/dL Final   HCT 07/12/2022 38.1 (L)  39.0 - 52.0 % Final   MCV 07/12/2022 94.3  80.0 - 100.0 fL Final   MCH 07/12/2022 31.9  26.0 - 34.0 pg Final   MCHC 07/12/2022 33.9  30.0 - 36.0 g/dL Final   RDW 07/12/2022 13.8  11.5 - 15.5 % Final   Platelets 07/12/2022 291  150 - 400 K/uL Final   nRBC 07/12/2022 0.0  0.0 - 0.2 % Final   Performed at [facility], [place] [address] [place], [place] [address]   Sodium 07/12/2022 136  135 - 145 mmol/L Final   Potassium 07/12/2022 3.2 (L)  3.5 - 5.1 mmol/L Final   Chloride 07/12/2022 102  98 - 111 mmol/L Final   CO2 07/12/2022 23  22 - 32 mmol/L Final   Glucose, Bld 07/12/2022 114 (H)  70 - 99 mg/dL Final   Glucose reference range applies only to samples taken after fasting for at least 8 hours.   BUN 07/12/2022 16  6 - 20 mg/dL Final   Creatinine, Ser 07/12/2022 1.35 (H)  0.61 - 1.24 mg/dL Final   Calcium 07/12/2022 9.1  8.9 - 10.3 mg/dL Final   Total Protein 07/12/2022 7.2  6.5 - 8.1 g/dL Final   Albumin 07/12/2022 3.9  3.5 - 5.0 g/dL Final   AST 07/12/2022 27  15 - 41 U/L Final   ALT 07/12/2022 18  0 - 44 U/L Final   Alkaline Phosphatase 07/12/2022 77  38 - 126 U/L Final   Total Bilirubin 07/12/2022 0.8  0.3 - 1.2 mg/dL Final   GFR, Estimated 07/12/2022 >60  >60 mL/min Final   Comment: (NOTE) Calculated using the CKD-EPI Creatinine Equation  (2021)    Anion gap 07/12/2022 11  5 - 15 Final   Performed at [facility] [address], [place], [place] [address]   Troponin I (High Sensitivity) 07/12/2022 5  <18 ng/L Final   Comment: (NOTE) Elevated high sensitivity troponin I (hsTnI) values and significant  changes across serial measurements may suggest ACS but many other  chronic and acute conditions are known to elevate hsTnI results.  Refer to the "Links" section for chest pain algorithms and additional  guidance. Performed at [facility], [place] [address], [place], [place] [address]    Alcohol, Ethyl (B) 07/12/2022 <10  <10 mg/dL Final   Comment: (NOTE) Lowest detectable limit for serum alcohol is 10 mg/dL.  For medical purposes only. Performed at [facility], [place] [address] [place], [place] [address]    Troponin I (High Sensitivity) 07/12/2022 4  <18 ng/L Final   Comment: (NOTE) Elevated high sensitivity troponin I (hsTnI) values and significant  changes across serial measurements may suggest ACS but many other  chronic and acute conditions are known to elevate hsTnI results.  Refer to the "Links" section for chest pain algorithms and additional  guidance. Performed at [facility], [place] [address], [place], [place] [address]   No results displayed because visit has over 200 results.      Allergies: Paxil [paroxetine]  Medications:  Facility Ordered Medications  Medication   aspirin EC tablet 81 mg   cholecalciferol (VITAMIN D3) 25 MCG (1000 UNIT) tablet 5,000 Units   FLUoxetine (PROZAC) capsule 10 mg   pantoprazole (PROTONIX) EC tablet 40 mg   amLODipine (NORVASC) tablet 10 mg   acetaminophen (TYLENOL) tablet 650 mg   alum & mag  hydroxide-simeth (MAALOX/MYLANTA) 200-200-20 MG/5ML suspension 30 mL   traZODone (DESYREL) tablet 50 mg   hydrOXYzine (ATARAX) tablet 25 mg   gabapentin (NEURONTIN) capsule 200 mg   PTA Medications  Medication Sig   aspirin EC 81 MG tablet Take 81  mg by mouth daily.   acetaminophen (TYLENOL) 500 MG tablet Take 500-1,000 mg by mouth daily as needed for mild pain or headache.   amLODipine (NORVASC) 10 MG tablet Take 1 tablet (10 mg total) by mouth daily.   pantoprazole (PROTONIX) 40 MG tablet Take 1 tablet (40 mg total) by mouth daily.   FLUoxetine (PROZAC) 10 MG capsule Take 1 capsule (10 mg total) by mouth daily.   ergocalciferol (VITAMIN D2) 1.25 MG (50000 UT) capsule Take 1 capsule (50,000 Units total) by mouth 2 (two) times a week. X 6 weeks.   Cholecalciferol (VITAMIN D3) 125 MCG (5000 UT) capsule Take 1 capsule (5,000 Units total) by mouth daily with breakfast. Take along with calcium and magnesium.    Medical Decision Making  Charles Estes is a 62 y/o male with a history of bipolar disorder, marijuana abuse, posttraumatic stress disorder and cocaine dependence presenting to [facility] under IVC with homicidal ideations and suicidal ideations.  Petition reviewed patient presenting complaining of homicidal ideations,  "repeats that he will kill everyone in the house".  States "he will kill himself after he states the homicidal will be because they are poisoning him with fentanyl ".    Recommendations  Based on my evaluation the patient does not appear to have an emergency medical condition. Patient will be admitted to [facility] for crisis management, safety and stabilization. Lucia Bitter, NP 08/24/22  7:05 AM

## 2022-08-24 NOTE — ED Notes (Signed)
Pt praying loudly and when asked by male peer to lower voice as male peer on the phone pt became agitated, starting to threaten male peer. Pt and male peer continued to exchange words and escalating behaviors noted with threats to physically fight. Secuirty was called and patients were separated.

## 2022-08-24 NOTE — Progress Notes (Signed)
Inpatient Behavioral Health Placement  Pt meets inpatient criteria per Lucia Bitter, NP.  There are no available beds within the [place] system per Day Garber, RN. Referral was sent to the following facilities;    Destination  Service Provider Address Phone Fax  [facility]  [address], [place] [address] [phone]  [place]  [address] [place] [id number] [phone]  [facility]  [address] [place], [place] [address] [phone]  [facility] [address] [place] [address] [phone]  [facility] [place] [address] [place]  [facility]  [address] [place] [address] [phone]  [facility]  [place], [place] [address] [phone]  [facility]  [address] [place] [address] [place]  [place]  [address] [place] [address] [phone]  [facility]  [address] [place] [address] [phone]  [facility] [address] [place] [address] [phone]  [facility]  [address] [place] [address] [phone] [place]  [address], [place] [address] [phone] [facility]  [address], [place] [address] [phone]  [facility]  [address] [place] [address] [phone] [facility]  [address] [place] [address] [phone]  [facility]  [place],  [place] [address] [place]  [facility]  [address], [place] [address] [phone] [facility]  [place] [address], [place] [address] [phone]   Situation ongoing,  CSW will follow up.   Benjaman Kindler, MSW, [facility] [address] 08/24/2022  @ 11:41 AM
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
## 2022-08-24 NOTE — ED Notes (Signed)
Pt sleeping@this time. breathing even and unlabored. Will continue to monitor for safety 

## 2022-08-24 NOTE — Progress Notes (Addendum)
Received call from [facility] at [facility]. Patient accepted to [facility].  Accepting MD - DR. Cheltenham. RN TO RN REPORT [phone]. Patient can arrive TOMORROW, 3/24 anytime after 8 am. Primary RN and providers made aware.  [place] contact information is [phone].
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
## 2022-08-25 ENCOUNTER — Encounter (HOSPITAL_COMMUNITY): Payer: Self-pay | Admitting: Behavioral Health

## 2022-08-25 DIAGNOSIS — F315 Bipolar disorder, current episode depressed, severe, with psychotic features: Secondary | ICD-10-CM | POA: Diagnosis not present

## 2022-08-25 MED ORDER — OLANZAPINE 5 MG PO TBDP
5.0000 mg | ORAL_TABLET | Freq: Once | ORAL | Status: AC
  Administered 2022-08-25: 5 mg via ORAL
  Filled 2022-08-25: qty 1

## 2022-08-25 MED ORDER — MENTHOL 3 MG MT LOZG
1.0000 | LOZENGE | Freq: Once | OROMUCOSAL | Status: AC
  Administered 2022-08-25: 3 mg via ORAL
  Filled 2022-08-25: qty 9

## 2022-08-25 NOTE — ED Notes (Signed)
Patient observed/assessed on unit. Patient aroused with minimal verbal stimulation. Patient appears defensive, eye contact minimal/evasive, speech low. Patient verbalizes no complaints at this time. Patient denies S/I. He explains that he continually is experiencing thoughts of H/I explaining that he would like to "kill all those that tried to kill me" explaining of of a house that he was staying at in [place] stating that they were giving him medication that they were using to try to kill him. He is alert and oriented to Self, Location, and disoriented to date by 2 days. Patient's appetite has been good and he explains that he had his last BM today without issue. Will continue to monitor/support.

## 2022-08-25 NOTE — ED Notes (Signed)
Pt given dinner meal and is watching tv without incident.

## 2022-08-25 NOTE — ED Notes (Signed)
Report was called to [facility] and [facility] the charge RN stated  that they no longer have a bed for him.   Msg to cancel [facility] transport was left on their machine.  Provider notified.

## 2022-08-25 NOTE — ED Notes (Signed)
Patient observed/assessed in bed/chair resting quietly appearing in no distress and verbalizing no complaints at this time. Will continue to monitor.  

## 2022-08-25 NOTE — ED Notes (Signed)
Pt resting quietly at this time

## 2022-08-25 NOTE — ED Notes (Signed)
Pt sleeping@this time. Breathing even and unlabored. Will continue to monitor for safety 

## 2022-08-25 NOTE — ED Provider Notes (Signed)
Behavioral Health Progress Note  Date and Time: 08/25/2022 2:50 PM Name: Charles Estes MRN:  [id number]  Subjective:  "Ma'am please, I must kill them"  Charles Estes is a 62 y.o. male patient with a past psychiatric history of bipolar disorder, marijuana abuse, PTSD, and cocaine dependence who was admitted to [facility] after being transferred from [facility] where he initially presented with complaints of chest pain and dizziness. Patient then reported SI and HI and was placed under IVC by EDP Dr. Regenia Skeeter. Patient was medically cleared by [place]. Patient was seen and evaluated by Merlyn Lot, NP and recommended for admission to [facility] continuous assessment to be monitored for safety and mood stability.  Per IVC: "Patient presenting complaining of homicidal ideations. Repeats that he will "kill everyone in the house." Also says he will kill himself after. Says the homicide will be because they are "poisoning him with fentanyl."  Patient reassessed face-to-face by this provider, consulted with Dr. Leverne Humbles, and chart reviewed on 08/25/22. On evaluation, Charles Estes is laying on recliner in observation area in no acute distress. Patient is alert and oriented x4, irritable but cooperative. Hid mood is depressed and irritable and labile with congruent affect. He spoke in a clear tone at normal volume and pace with good eye contact. Thought process is circumstantial with illogical thought content. 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, I tried to take the police gun but he was left handed." When asked if patient is experiencing homicidal ideations, patient states, "ma'am please, I must kill them." Patient then asked provider if she knows that "mirror mirror song." Patient then proceeds to sing, "mirror mirror on the wall, if I kill one I must kill them all." Patient reports homicidal ideations towards "the people that gave me fentanyl, tried to kill me,  moved my vehicle, and said I was super stupid." Patient expresses frustration that "everyone thinks I'm lying, I didn't see their faces but I heard their voices, I was in a daze." Patient is unable to verbally contract for safety with this Probation officer. Patient denies auditory hallucinations but reports visual hallucinations stating, "I see dead white people but white people didn't do nothing to me, I should be seeing dead black people." Patient denies symptoms of paranoia.  Per nursing staff, patient has been irritable, argumentative, and resistant to care although he is able to be deescalated verbally. Yesterday, patient was praying loudly on unit then became agitated and began threatening a male peer when the peer asked him to lower his voice.   Discussed with patient that he is currently under IVC and has been recommended for inpatient psychiatric hospitalization. Patient verbalizes understanding and states he desires to be in a facility near [place] to be close to his family.   Diagnosis:  Final diagnoses:  Bipolar disorder, current episode depressed, severe, with psychotic features ([place])   Total Time spent with patient: 30 minutes  Past Psychiatric History: Bipolar disorder, marijuana abuse, PTSD, and cocaine dependence  Past Medical History:  Past Medical History:  Diagnosis Date   Depression    GERD (gastroesophageal reflux disease)    Hypertension    Pathologic ulnar fracture with malunion    right   Family History:  Family History  Problem Relation Age of Onset   Hypertension Mother    Heart attack Mother    Hypertension Father    Heart attack Father    Heart attack Sister    Hypertension Sister  Heart attack Sister    Hypertension Brother    Family Psychiatric  History: None reported  Social History:  Social History   Tobacco Use   Smoking status: Never   Smokeless tobacco: Never  Substance Use Topics   Alcohol use: Yes    Comment: socially- past weekend reports  10 beers   Drug use: Yes    Types: Cocaine    Comment: occasionally   Sleep: Fair  Appetite:  Fair  Current Medications:  Current Facility-Administered Medications  Medication Dose Route Frequency Provider Last Rate Last Admin   acetaminophen (TYLENOL) tablet 650 mg  650 mg Oral Q6H PRN Bobbitt, Shalon E, NP   650 mg at 08/25/22 1053   alum & mag hydroxide-simeth (MAALOX/MYLANTA) 200-200-20 MG/5ML suspension 30 mL  30 mL Oral Q4H PRN Bobbitt, Shalon E, NP       amLODipine (NORVASC) tablet 10 mg  10 mg Oral Daily Bobbitt, Shalon E, NP   10 mg at 08/25/22 1055   aspirin EC tablet 81 mg  81 mg Oral Daily Bobbitt, Shalon E, NP   81 mg at 08/25/22 1055   cholecalciferol (VITAMIN D3) 25 MCG (1000 UNIT) tablet 5,000 Units  5,000 Units Oral Q breakfast Bobbitt, Shalon E, NP   5,000 Units at 08/25/22 1054   FLUoxetine (PROZAC) capsule 10 mg  10 mg Oral Daily Bobbitt, Shalon E, NP   10 mg at 08/24/22 1014   gabapentin (NEURONTIN) capsule 200 mg  200 mg Oral TID Bobbitt, Shalon E, NP   200 mg at 08/25/22 1055   hydrOXYzine (ATARAX) tablet 25 mg  25 mg Oral TID PRN Bobbitt, Shalon E, NP   25 mg at 08/23/22 2334   LORazepam (ATIVAN) tablet 1 mg  1 mg Oral PRN White, Patrice L, NP       And   ziprasidone (GEODON) injection 20 mg  20 mg Intramuscular PRN White, Patrice L, NP       OLANZapine zydis (ZYPREXA) disintegrating tablet 5 mg  5 mg Oral BID White, Patrice L, NP   5 mg at 08/24/22 1300   pantoprazole (PROTONIX) EC tablet 40 mg  40 mg Oral Daily Bobbitt, Shalon E, NP   40 mg at 08/25/22 1054   traZODone (DESYREL) tablet 50 mg  50 mg Oral QHS PRN Bobbitt, Shalon E, NP   50 mg at 08/23/22 2334   Current Outpatient Medications  Medication Sig Dispense Refill   amLODipine (NORVASC) 10 MG tablet Take 1 tablet (10 mg total) by mouth daily. 30 tablet 0   aspirin EC 81 MG tablet Take 81 mg by mouth daily.     pantoprazole (PROTONIX) 40 MG tablet Take 1 tablet (40 mg total) by mouth daily. 30 tablet 0     Labs  Lab Results:  Admission on 08/23/2022  Component Date Value Ref Range Status   Cholesterol 08/23/2022 199  0 - 200 mg/dL Final   Triglycerides 08/23/2022 69  <150 mg/dL Final   HDL 08/23/2022 43  >40 mg/dL Final   Total CHOL/HDL Ratio 08/23/2022 4.6  RATIO Final   VLDL 08/23/2022 14  0 - 40 mg/dL Final   LDL Cholesterol 08/23/2022 142 (H)  0 - 99 mg/dL Final   Comment:        Total Cholesterol/HDL:CHD Risk Coronary Heart Disease Risk Table                     Men   Women  1/2 Average Risk  3.4   3.3  Average Risk       5.0   4.4  2 X Average Risk   9.6   7.1  3 X Average Risk  23.4   11.0        Use the calculated Patient Ratio above and the CHD Risk Table to determine the patient's CHD Risk.        ATP III CLASSIFICATION (LDL):  <100     mg/dL   Optimal  100-129  mg/dL   Near or Above                    Optimal  130-159  mg/dL   Borderline  160-189  mg/dL   High  >190     mg/dL   Very High Performed at [facility] [address] [place], [place] [address]   Admission on 08/23/2022, Discharged on 08/23/2022  Component Date Value Ref Range Status   Sodium 08/23/2022 138  135 - 145 mmol/L Final   Potassium 08/23/2022 4.0  3.5 - 5.1 mmol/L Final   HEMOLYSIS AT THIS LEVEL MAY AFFECT RESULT   Chloride 08/23/2022 105  98 - 111 mmol/L Final   CO2 08/23/2022 23  22 - 32 mmol/L Final   Glucose, Bld 08/23/2022 104 (H)  70 - 99 mg/dL Final   Glucose reference range applies only to samples taken after fasting for at least 8 hours.   BUN 08/23/2022 15  6 - 20 mg/dL Final   Creatinine, Ser 08/23/2022 1.20  0.61 - 1.24 mg/dL Final   Calcium 08/23/2022 9.1  8.9 - 10.3 mg/dL Final   GFR, Estimated 08/23/2022 >60  >60 mL/min Final   Comment: (NOTE) Calculated using the CKD-EPI Creatinine Equation (2021)    Anion gap 08/23/2022 10  5 - 15 Final   Performed at [facility], [place] [address] [place], [place] [address]   WBC 08/23/2022 8.2  4.0 - 10.5 K/uL Final    RBC 08/23/2022 4.14 (L)  4.22 - 5.81 MIL/uL Final   Hemoglobin 08/23/2022 13.1  13.0 - 17.0 g/dL Final   HCT 08/23/2022 38.2 (L)  39.0 - 52.0 % Final   MCV 08/23/2022 92.3  80.0 - 100.0 fL Final   MCH 08/23/2022 31.6  26.0 - 34.0 pg Final   MCHC 08/23/2022 34.3  30.0 - 36.0 g/dL Final   RDW 08/23/2022 13.1  11.5 - 15.5 % Final   Platelets 08/23/2022 281  150 - 400 K/uL Final   nRBC 08/23/2022 0.0  0.0 - 0.2 % Final   Performed at [facility] [address] [place], [place] [address]   Troponin I (High Sensitivity) 08/23/2022 5  <18 ng/L Final   Comment: (NOTE) Elevated high sensitivity troponin I (hsTnI) values and significant  changes across serial measurements may suggest ACS but many other  chronic and acute conditions are known to elevate hsTnI results.  Refer to the "Links" section for chest pain algorithms and additional  guidance. Performed at [facility], [place] [address], [place], [place] [address]    Glucose-Capillary 08/23/2022 100 (H)  70 - 99 mg/dL Final   Glucose reference range applies only to samples taken after fasting for at least 8 hours.   Alcohol, Ethyl (B) 08/23/2022 <10  <10 mg/dL Final   Comment: (NOTE) Lowest detectable limit for serum alcohol is 10 mg/dL.  For medical purposes only. Performed at [facility], [place]  [address], [place], [place] [address]    Opiates 08/23/2022 NONE DETECTED  NONE DETECTED Final   Cocaine 08/23/2022 POSITIVE (A)  NONE DETECTED Final   Benzodiazepines 08/23/2022 NONE DETECTED  NONE DETECTED Final   Amphetamines 08/23/2022 NONE DETECTED  NONE DETECTED Final   Tetrahydrocannabinol 08/23/2022 NONE DETECTED  NONE DETECTED Final   Barbiturates 08/23/2022 NONE DETECTED  NONE DETECTED Final   Comment: (NOTE) DRUG SCREEN FOR MEDICAL PURPOSES ONLY.  IF CONFIRMATION IS NEEDED FOR ANY PURPOSE, NOTIFY LAB WITHIN 5 DAYS.  LOWEST DETECTABLE LIMITS FOR URINE DRUG SCREEN Drug Class                     Cutoff  (ng/mL) Amphetamine and metabolites    1000 Barbiturate and metabolites    200 Benzodiazepine                 200 Opiates and metabolites        300 Cocaine and metabolites        300 THC                            50 Performed at [facility], [place] [address], [place], [place] [id number]    Salicylate Lvl 123XX123 <7.0 (L)  7.0 - 30.0 mg/dL Final   Performed at [facility] [address] [place], [place] [address]   Acetaminophen (Tylenol), Serum 08/23/2022 <10 (L)  10 - 30 ug/mL Final   Comment: (NOTE) Therapeutic concentrations vary significantly. A range of 10-30 ug/mL  may be an effective concentration for many patients. However, some  are best treated at concentrations outside of this range. Acetaminophen concentrations >150 ug/mL at 4 hours after ingestion  and >50 ug/mL at 12 hours after ingestion are often associated with  toxic reactions.  Performed at [facility], [place] [address] [place], [place] [address]    SARS Coronavirus 2 by RT PCR 08/23/2022 NEGATIVE  NEGATIVE Final   Influenza A by PCR 08/23/2022 NEGATIVE  NEGATIVE Final   Influenza B by PCR 08/23/2022 NEGATIVE  NEGATIVE Final   Comment: (NOTE) The Xpert Xpress SARS-CoV-2/FLU/RSV plus assay is intended as an aid in the diagnosis of influenza from Nasopharyngeal swab specimens and should not be used as a sole basis for treatment. Nasal washings and aspirates are unacceptable for Xpert Xpress SARS-CoV-2/FLU/RSV testing.  Fact Sheet for Patients: EntrepreneurPulse.com.au  Fact Sheet for Healthcare Providers: IncredibleEmployment.be  This test is not yet approved or cleared by the [place] FDA and has been authorized for detection and/or diagnosis of SARS-CoV-2 by FDA under an Emergency Use Authorization (EUA). This EUA will remain in effect (meaning this test can be used) for the duration of the COVID-19 declaration under Section 564(b)(1) of the  Act, 21 U.S.C. section 360bbb-3(b)(1), unless the authorization is terminated or revoked.     Resp Syncytial Virus by PCR 08/23/2022 NEGATIVE  NEGATIVE Final   Comment: (NOTE) Fact Sheet for Patients: EntrepreneurPulse.com.au  Fact Sheet for Healthcare Providers: IncredibleEmployment.be  This test is not yet approved or cleared by the [place] FDA and has been authorized for detection and/or diagnosis of SARS-CoV-2 by FDA under an Emergency Use Authorization (EUA). This EUA will remain in effect (meaning this test can be used) for the duration of the COVID-19 declaration under Section 564(b)(1) of the Act, 21 U.S.C. section 360bbb-3(b)(1), unless the authorization is terminated or revoked.  Performed at [facility], [place] [address] [place], [place] [address]   Office Visit on 07/24/2022  Component Date Value Ref Range Status   Summary 07/24/2022 Note   Final   Comment: ==================================================================== Compliance Drug Analysis, Ur ==================================================================== Test                             Result       Flag       Units  Drug Present not Declared for Prescription Verification   Benzoylecgonine                170          UNEXPECTED ng/mg creat    Benzoylecgonine is a metabolite of cocaine; its presence indicates    use of this drug.  Source is most commonly illicit, but cocaine is    present in some topical anesthetic solutions.    Naproxen                       PRESENT      UNEXPECTED  Drug Absent but Declared for Prescription Verification   Fluoxetine                     Not Detected UNEXPECTED   Aripiprazole                   Not Detected UNEXPECTED   Acetaminophen                  Not Detected UNEXPECTED    Acetaminophen, as indicated in the declared medication list, is not    always detected even when used as directed.    Salicylate                                                Not Detected UNEXPECTED    Aspirin, as indicated in the declared medication list, is not always    detected even when used as directed.    Promethazine                   Not Detected UNEXPECTED ==================================================================== Test                      Result    Flag   Units      Ref Range   Creatinine              30               mg/dL      >=20 ==================================================================== Declared Medications:  The flagging and interpretation on this report are based on the  following declared medications.  Unexpected results may arise from  inaccuracies in the declared medications.   **Note: The testing scope of this panel includes these medications:   Aripiprazole (Abilify)  Fluoxetine (Prozac)  Promethazine (Phenergan)   **Note: The testing scope of this panel does not include small to  moderate amounts of these reported medications:   Acetaminophen (Tylenol)  Aspirin   **Note: The testing sco                          pe of this panel does not include the  following reported medications:   Amlodipine (  Norvasc)  Carvedilol (Coreg)  Hydralazine (Apresoline)  Pantoprazole (Protonix)  Pravastatin (Pravachol)  Tamsulosin (Flomax) ==================================================================== For clinical consultation, please call [phone]. ====================================================================    Glucose 07/24/2022 91  70 - 99 mg/dL Final   BUN 07/24/2022 11  6 - 24 mg/dL Final   Creatinine, Ser 07/24/2022 0.98  0.76 - 1.27 mg/dL Final   eGFR 07/24/2022 89  >59 mL/min/1.73 Final   BUN/Creatinine Ratio 07/24/2022 11  9 - 20 Final   Sodium 07/24/2022 142  134 - 144 mmol/L Final   Potassium 07/24/2022 4.5  3.5 - 5.2 mmol/L Final   Chloride 07/24/2022 106  96 - 106 mmol/L Final   Calcium 07/24/2022 9.5  8.7 - 10.2 mg/dL Final   Total Protein 07/24/2022  6.3  6.0 - 8.5 g/dL Final   Albumin 07/24/2022 4.0  3.8 - 4.9 g/dL Final   Globulin, Total 07/24/2022 2.3  1.5 - 4.5 g/dL Final   Albumin/Globulin Ratio 07/24/2022 1.7  1.2 - 2.2 Final   Bilirubin Total 07/24/2022 0.2  0.0 - 1.2 mg/dL Final   Alkaline Phosphatase 07/24/2022 72  44 - 121 IU/L Final   AST 07/24/2022 13  0 - 40 IU/L Final   Magnesium 07/24/2022 2.0  1.6 - 2.3 mg/dL Final   Vitamin B-12 07/24/2022 400  232 - 1,245 pg/mL Final   Sed Rate 07/24/2022 3  0 - 30 mm/hr Final   25-Hydroxy, Vitamin D 07/24/2022 12 (L)  ng/mL Final   Comment: Reference Range: All Ages: Target levels 30 - 100    25-Hydroxy, Vitamin D-2 07/24/2022 1.2  ng/mL Final   Comment: This test was developed and its performance characteristics determined by [facility]. It has not been cleared or approved by the Food and Drug Administration.    25-Hydroxy, Vitamin D-3 07/24/2022 11  ng/mL Final   Comment: This test was developed and its performance characteristics determined by [facility]. It has not been cleared or approved by the Food and Drug Administration.    CRP 07/24/2022 <1  0 - 10 mg/L Final  Admission on 07/12/2022, Discharged on 07/12/2022  Component Date Value Ref Range Status   WBC 07/12/2022 9.0  4.0 - 10.5 K/uL Final   RBC 07/12/2022 4.04 (L)  4.22 - 5.81 MIL/uL Final   Hemoglobin 07/12/2022 12.9 (L)  13.0 - 17.0 g/dL Final   HCT 07/12/2022 38.1 (L)  39.0 - 52.0 % Final   MCV 07/12/2022 94.3  80.0 - 100.0 fL Final   MCH 07/12/2022 31.9  26.0 - 34.0 pg Final   MCHC 07/12/2022 33.9  30.0 - 36.0 g/dL Final   RDW 07/12/2022 13.8  11.5 - 15.5 % Final   Platelets 07/12/2022 291  150 - 400 K/uL Final   nRBC 07/12/2022 0.0  0.0 - 0.2 % Final   Performed at [facility], [place] [address] [place], [place] [address]   Sodium 07/12/2022 136  135 - 145 mmol/L Final   Potassium 07/12/2022 3.2 (L)  3.5 - 5.1 mmol/L Final   Chloride 07/12/2022 102  98 - 111 mmol/L Final   CO2 07/12/2022 23  22 - 32  mmol/L Final   Glucose, Bld 07/12/2022 114 (H)  70 - 99 mg/dL Final   Glucose reference range applies only to samples taken after fasting for at least 8 hours.   BUN 07/12/2022 16  6 - 20 mg/dL Final   Creatinine, Ser 07/12/2022 1.35 (H)  0.61 - 1.24 mg/dL Final   Calcium 07/12/2022 9.1  8.9 - 10.3 mg/dL Final   Total Protein 07/12/2022 7.2  6.5 - 8.1 g/dL Final   Albumin 07/12/2022 3.9  3.5 - 5.0 g/dL Final   AST 07/12/2022 27  15 - 41 U/L Final   ALT 07/12/2022 18  0 - 44 U/L Final   Alkaline Phosphatase 07/12/2022 77  38 - 126 U/L Final   Total Bilirubin 07/12/2022 0.8  0.3 - 1.2 mg/dL Final   GFR, Estimated 07/12/2022 >60  >60 mL/min Final   Comment: (NOTE) Calculated using the CKD-EPI Creatinine Equation (2021)    Anion gap 07/12/2022 11  5 - 15 Final   Performed at [facility], [place] [address], [place], [place] [address]   Troponin I (High Sensitivity) 07/12/2022 5  <18 ng/L Final   Comment: (NOTE) Elevated high sensitivity troponin I (hsTnI) values and significant  changes across serial measurements may suggest ACS but many other  chronic and acute conditions are known to elevate hsTnI results.  Refer to the "Links" section for chest pain algorithms and additional  guidance. Performed at [facility], [place] [address] [place], [place] [address]    Alcohol, Ethyl (B) 07/12/2022 <10  <10 mg/dL Final   Comment: (NOTE) Lowest detectable limit for serum alcohol is 10 mg/dL.  For medical purposes only. Performed at [facility], [place] [address] [place], [place] [address]    Troponin I (High Sensitivity) 07/12/2022 4  <18 ng/L Final   Comment: (NOTE) Elevated high sensitivity troponin I (hsTnI) values and significant  changes across serial measurements may suggest ACS but many other  chronic and acute conditions are known to elevate hsTnI results.  Refer to the "Links" section for chest pain algorithms and additional  guidance. Performed at [facility], [place] [address] [place],  [address]   No results displayed because visit has over 200 results.      Blood Alcohol level:  Lab Results  Component Value Date   ETH <10 08/23/2022   ETH <10 123456    Metabolic Disorder Labs: Lab Results  Component Value Date   HGBA1C 6.0 (H) 01/21/2022   MPG 125.5 01/21/2022   No results found for: "PROLACTIN" Lab Results  Component Value Date   CHOL 199 08/23/2022   TRIG 69 08/23/2022   HDL 43 08/23/2022   CHOLHDL 4.6 08/23/2022   VLDL 14 08/23/2022   LDLCALC 142 (H) 08/23/2022   LDLCALC 116 (H) 03/24/2022    Therapeutic Lab Levels: No results found for: "LITHIUM" No results found for: "VALPROATE" No results found for: "CBMZ"  Physical Findings   AIMS    Flowsheet Row ED to Hosp-Admission (Discharged) from 01/18/2022 in [facility]  AIMS Total Score 0      AUDIT    Flowsheet Row ED to Hosp-Admission (Discharged) from 01/18/2022 in [facility]  Alcohol Use Disorder Identification Test Final Score (AUDIT) 5      PHQ2-9    Flowsheet Row ED from 08/23/2022 in [facility] Office Visit from 08/21/2022 in [facility] Total Score 2 3  PHQ-9 Total Score 9 20      [place] ED from 08/23/2022 in [facility] Most recent reading at 08/24/2022  6:38 AM ED from 08/23/2022 in [facility] Emergency Department at [facility] Most recent reading at 08/23/2022  2:27 PM ED from  07/12/2022 in [facility] Emergency Department at [facility] Most recent reading at 07/12/2022  3:26 AM  C-SSRS RISK CATEGORY High Risk High Risk No Risk        Musculoskeletal  Strength & Muscle Tone: within normal limits Gait & Station: normal Patient leans: N/A  Psychiatric Specialty Exam  Presentation  General Appearance:   Appropriate for Environment  Eye Contact: Good  Speech: Clear and Coherent; Normal Rate  Speech Volume: Normal  Handedness: Right   Mood and Affect  Mood: Irritable; Labile  Affect: Congruent   Thought Process  Thought Processes: Disorganized  Descriptions of Associations:Circumstantial  Orientation:Full (Time, Place and Person)  Thought Content:Illogical  Diagnosis of Schizophrenia or Schizoaffective disorder in past: Yes    Hallucinations:Hallucinations: Visual Description of Visual Hallucinations: "I see dead white people"  Ideas of Reference:None  Suicidal Thoughts:Suicidal Thoughts: No  Homicidal Thoughts:Homicidal Thoughts: Yes, Active HI Active Intent and/or Plan: With Intent  Sensorium  Memory: Immediate Fair; Recent Fair; Remote Fair  Judgment: Poor  Insight: Poor   Executive Functions  Concentration: Fair  Attention Span: Fair  Recall: [employer] of Knowledge: Fair  Language: Fair   Psychomotor Activity  Psychomotor Activity: Psychomotor Activity: Normal   Assets  Assets: Armed forces logistics/support/administrative officer; Desire for Improvement; Financial Resources/Insurance; Transportation   Sleep  Sleep: Sleep: Fair Number of Hours of Sleep: 7   Nutritional Assessment (For OBS and FBC admissions only) Has the patient had a weight loss or gain of 10 pounds or more in the last 3 months?: No Has the patient had a decrease in food intake/or appetite?: No Does the patient have dental problems?: No Does the patient have eating habits or behaviors that may be indicators of an eating disorder including binging or inducing vomiting?: No Has the patient recently lost weight without trying?: 0 Has the patient been eating poorly because of a decreased appetite?: 0 Malnutrition Screening Tool Score: 0    Physical Exam  Physical Exam Vitals and nursing note reviewed.  Constitutional:      General: He is not in acute distress.    Appearance:  Normal appearance. He is not ill-appearing.  HENT:     Head: Normocephalic and atraumatic.     Nose: Nose normal.  Eyes:     Pupils: Pupils are equal, round, and reactive to light.  Cardiovascular:     Rate and Rhythm: Normal rate.  Pulmonary:     Effort: Pulmonary effort is normal.  Musculoskeletal:        General: Normal range of motion.     Cervical back: Normal range of motion.  Skin:    General: Skin is warm and dry.  Neurological:     General: No focal deficit present.     Mental Status: He is alert and oriented to person, place, and time. Mental status is at baseline.  Psychiatric:        Attention and Perception: Attention and perception normal.        Mood and Affect: Mood is depressed. Affect is labile.        Speech: Speech normal.        Behavior: Behavior is agitated. Behavior is cooperative.        Thought Content: Thought content includes homicidal ideation. Thought content includes homicidal plan.        Cognition and Memory: Cognition and memory normal.        Judgment: Judgment is impulsive.    Review of Systems  Constitutional: Negative.  HENT: Negative.    Eyes: Negative.   Respiratory: Negative.    Cardiovascular: Negative.   Gastrointestinal: Negative.   Genitourinary: Negative.   Musculoskeletal: Negative.   Skin: Negative.   Neurological: Negative.   Endo/Heme/Allergies: Negative.   Psychiatric/Behavioral:  Positive for depression.    Blood pressure (!) 127/91, pulse 73, temperature 98.7 F (37.1 C), temperature source Oral, resp. rate 18, SpO2 98 %. There is no height or weight on file to calculate BMI.  Treatment Plan Summary: Plan Patient to remain under IVC, continue to recommend inpatient psychiatric treatment. Patient was supposed to be transferred to [facility] this morning, but [facility] charge RN stated they no longer have a bed for him per nursing notes. Patient has been faxed out due to no appropriate beds available at [facility].  Hezzie Bump, NP 08/25/2022 2:50 PM

## 2022-08-25 NOTE — ED Notes (Signed)
Pt awake at this time.  Pt is argumentative and resistant to care.  Pt was able to be deescalated verbally. [facility] was called and msg via pager to have [facility]  call back for report was left.

## 2022-08-25 NOTE — ED Notes (Signed)
Patient arrived on unit. Patient calm and congruent. Patient given food. Patient become nauseated. Patient regurgitated. Patient is lying down rocking in self-soothing pattern. Patient is safe on unit aeb RN care.

## 2022-08-25 NOTE — Progress Notes (Signed)
Patient has been denied by [facility] due to no appropriate beds available. Patient meets [place] inpatient criteria per Charles Reichert, NP. Patient has been faxed out to the following facilities:   [facility]  [address] [place] [address] [phone]  [place], [place] [phone]  [facility]  [address] [place], [place] [address] [phone] [place] [address] [phone]  [facility]  [address] [place] [address] [place]  [facility]  [address] [place] [address] [phone]  [place], [place] [address] [place]  [place] [place] [address] [place]  [facility]  [address] [place] [address] [phone]  [facility]  [address], [place] [address] [phone]  [place] [address] [place] [address] [phone]  [facility]  [address] [place] [address] [phone] [place]  [address], [place] [address] [phone] [facility]  [address], [place] [address] [phone]  [facility]  [address], [place] [address] [phone] [facility]  [address] [place] [address] [phone]  [facility]  [place], [place] [address] [place]  [facility]  [address], [place]  [address] [phone] [facility]  [place] [address], [place] [address] [phone] [place], MSW, LCSW-A  11:16 AM 08/25/2022

## 2022-08-25 NOTE — ED Notes (Signed)
While administering QHS medications, patient verbalized complaint of persistent cough(states believes to be related to allergies) and requested PRN medication to treat. Medical providers notified. Awaiting further orders.

## 2022-08-26 ENCOUNTER — Other Ambulatory Visit: Payer: Self-pay

## 2022-08-26 ENCOUNTER — Inpatient Hospital Stay (HOSPITAL_COMMUNITY)
Admission: AD | Admit: 2022-08-26 | Discharge: 2022-09-04 | DRG: 885 | Disposition: A | Discharge status: Home / Self Care | Payer: 59 | Source: Intra-hospital | Attending: Psychiatry | Admitting: Psychiatry

## 2022-08-26 ENCOUNTER — Encounter (HOSPITAL_COMMUNITY): Payer: Self-pay | Admitting: Psychiatry

## 2022-08-26 DIAGNOSIS — R45850 Homicidal ideations: Secondary | ICD-10-CM | POA: Diagnosis present

## 2022-08-26 DIAGNOSIS — F1420 Cocaine dependence, uncomplicated: Secondary | ICD-10-CM | POA: Diagnosis present

## 2022-08-26 DIAGNOSIS — Z5982 Transportation insecurity: Secondary | ICD-10-CM

## 2022-08-26 DIAGNOSIS — R609 Edema, unspecified: Secondary | ICD-10-CM | POA: Diagnosis present

## 2022-08-26 DIAGNOSIS — E669 Obesity, unspecified: Secondary | ICD-10-CM | POA: Diagnosis present

## 2022-08-26 DIAGNOSIS — F410 Panic disorder [episodic paroxysmal anxiety] without agoraphobia: Secondary | ICD-10-CM | POA: Diagnosis present

## 2022-08-26 DIAGNOSIS — Z5941 Food insecurity: Secondary | ICD-10-CM | POA: Diagnosis not present

## 2022-08-26 DIAGNOSIS — F1290 Cannabis use, unspecified, uncomplicated: Secondary | ICD-10-CM | POA: Diagnosis present

## 2022-08-26 DIAGNOSIS — Z5902 Unsheltered homelessness: Secondary | ICD-10-CM

## 2022-08-26 DIAGNOSIS — F4310 Post-traumatic stress disorder, unspecified: Secondary | ICD-10-CM | POA: Diagnosis present

## 2022-08-26 DIAGNOSIS — F1210 Cannabis abuse, uncomplicated: Secondary | ICD-10-CM | POA: Diagnosis present

## 2022-08-26 DIAGNOSIS — K219 Gastro-esophageal reflux disease without esophagitis: Secondary | ICD-10-CM | POA: Diagnosis present

## 2022-08-26 DIAGNOSIS — E559 Vitamin D deficiency, unspecified: Secondary | ICD-10-CM | POA: Diagnosis present

## 2022-08-26 DIAGNOSIS — R519 Headache, unspecified: Secondary | ICD-10-CM | POA: Diagnosis present

## 2022-08-26 DIAGNOSIS — G629 Polyneuropathy, unspecified: Secondary | ICD-10-CM | POA: Diagnosis present

## 2022-08-26 DIAGNOSIS — I10 Essential (primary) hypertension: Secondary | ICD-10-CM | POA: Diagnosis present

## 2022-08-26 DIAGNOSIS — F15159 Other stimulant abuse with stimulant-induced psychotic disorder, unspecified: Secondary | ICD-10-CM | POA: Diagnosis present

## 2022-08-26 DIAGNOSIS — I2510 Atherosclerotic heart disease of native coronary artery without angina pectoris: Secondary | ICD-10-CM | POA: Diagnosis present

## 2022-08-26 DIAGNOSIS — Z79899 Other long term (current) drug therapy: Secondary | ICD-10-CM

## 2022-08-26 DIAGNOSIS — Z8249 Family history of ischemic heart disease and other diseases of the circulatory system: Secondary | ICD-10-CM

## 2022-08-26 DIAGNOSIS — Z7982 Long term (current) use of aspirin: Secondary | ICD-10-CM

## 2022-08-26 DIAGNOSIS — F315 Bipolar disorder, current episode depressed, severe, with psychotic features: Secondary | ICD-10-CM | POA: Diagnosis present

## 2022-08-26 DIAGNOSIS — I951 Orthostatic hypotension: Secondary | ICD-10-CM | POA: Diagnosis not present

## 2022-08-26 DIAGNOSIS — E785 Hyperlipidemia, unspecified: Secondary | ICD-10-CM | POA: Diagnosis present

## 2022-08-26 DIAGNOSIS — Z888 Allergy status to other drugs, medicaments and biological substances status: Secondary | ICD-10-CM | POA: Diagnosis not present

## 2022-08-26 DIAGNOSIS — R0789 Other chest pain: Secondary | ICD-10-CM | POA: Diagnosis present

## 2022-08-26 DIAGNOSIS — F411 Generalized anxiety disorder: Secondary | ICD-10-CM | POA: Diagnosis present

## 2022-08-26 DIAGNOSIS — Z560 Unemployment, unspecified: Secondary | ICD-10-CM

## 2022-08-26 DIAGNOSIS — Z9151 Personal history of suicidal behavior: Secondary | ICD-10-CM

## 2022-08-26 DIAGNOSIS — G8929 Other chronic pain: Secondary | ICD-10-CM | POA: Diagnosis present

## 2022-08-26 DIAGNOSIS — Z1152 Encounter for screening for COVID-19: Secondary | ICD-10-CM | POA: Diagnosis not present

## 2022-08-26 DIAGNOSIS — F1410 Cocaine abuse, uncomplicated: Secondary | ICD-10-CM | POA: Diagnosis present

## 2022-08-26 DIAGNOSIS — M549 Dorsalgia, unspecified: Secondary | ICD-10-CM | POA: Diagnosis present

## 2022-08-26 DIAGNOSIS — Z6832 Body mass index (BMI) 32.0-32.9, adult: Secondary | ICD-10-CM

## 2022-08-26 MED ORDER — TRAZODONE HCL 50 MG PO TABS
50.0000 mg | ORAL_TABLET | Freq: Every evening | ORAL | Status: DC | PRN
  Administered 2022-08-26: 50 mg via ORAL
  Filled 2022-08-26: qty 1

## 2022-08-26 MED ORDER — LORAZEPAM 1 MG PO TABS: 2.0000 mg | ORAL_TABLET | Freq: Three times a day (TID) | ORAL | Status: DC | PRN

## 2022-08-26 MED ORDER — OLANZAPINE 5 MG PO TBDP
5.0000 mg | ORAL_TABLET | Freq: Two times a day (BID) | ORAL | Status: DC
  Administered 2022-08-26 – 2022-08-28 (×4): 5 mg via ORAL
  Filled 2022-08-26 (×8): qty 1

## 2022-08-26 MED ORDER — ACETAMINOPHEN 325 MG PO TABS
650.0000 mg | ORAL_TABLET | Freq: Four times a day (QID) | ORAL | Status: DC | PRN
  Administered 2022-08-30: 650 mg via ORAL
  Filled 2022-08-26: qty 2

## 2022-08-26 MED ORDER — DIPHENHYDRAMINE HCL 50 MG/ML IJ SOLN: 50.0000 mg | Freq: Three times a day (TID) | INTRAMUSCULAR | Status: DC | PRN

## 2022-08-26 MED ORDER — PANTOPRAZOLE SODIUM 40 MG PO TBEC
40.0000 mg | DELAYED_RELEASE_TABLET | Freq: Every day | ORAL | Status: DC
  Administered 2022-08-27 – 2022-09-04 (×9): 40 mg via ORAL
  Filled 2022-08-26 (×10): qty 1

## 2022-08-26 MED ORDER — ASPIRIN 81 MG PO TBEC
81.0000 mg | DELAYED_RELEASE_TABLET | Freq: Every day | ORAL | Status: DC
  Administered 2022-08-27 – 2022-09-04 (×9): 81 mg via ORAL
  Filled 2022-08-26 (×10): qty 1

## 2022-08-26 MED ORDER — AMLODIPINE BESYLATE 10 MG PO TABS
10.0000 mg | ORAL_TABLET | Freq: Every day | ORAL | Status: DC
  Administered 2022-08-27 – 2022-09-04 (×8): 10 mg via ORAL
  Filled 2022-08-26 (×10): qty 1

## 2022-08-26 MED ORDER — MAGNESIUM HYDROXIDE 400 MG/5ML PO SUSP: 30.0000 mL | Freq: Every day | ORAL | Status: DC | PRN

## 2022-08-26 MED ORDER — GABAPENTIN 100 MG PO CAPS: 200.0000 mg | ORAL_CAPSULE | Freq: Three times a day (TID) | ORAL | Status: DC

## 2022-08-26 MED ORDER — GABAPENTIN 100 MG PO CAPS
200.0000 mg | ORAL_CAPSULE | Freq: Three times a day (TID) | ORAL | Status: DC
  Administered 2022-08-26 – 2022-08-28 (×6): 200 mg via ORAL
  Filled 2022-08-26 (×11): qty 2

## 2022-08-26 MED ORDER — OLANZAPINE 5 MG PO TBDP: 5.0000 mg | ORAL_TABLET | Freq: Two times a day (BID) | ORAL | Status: DC

## 2022-08-26 MED ORDER — ALUM & MAG HYDROXIDE-SIMETH 200-200-20 MG/5ML PO SUSP
30.0000 mL | ORAL | Status: DC | PRN
  Administered 2022-08-28 – 2022-09-02 (×6): 30 mL via ORAL
  Filled 2022-08-26 (×6): qty 30

## 2022-08-26 MED ORDER — VITAMIN D 25 MCG (1000 UNIT) PO TABS
5000.0000 [IU] | ORAL_TABLET | Freq: Every day | ORAL | Status: DC
  Administered 2022-08-27: 5000 [IU] via ORAL
  Filled 2022-08-26 (×2): qty 5

## 2022-08-26 MED ORDER — DIPHENHYDRAMINE HCL 25 MG PO CAPS: 50.0000 mg | ORAL_CAPSULE | Freq: Three times a day (TID) | ORAL | Status: DC | PRN

## 2022-08-26 MED ORDER — HALOPERIDOL LACTATE 5 MG/ML IJ SOLN: 5.0000 mg | Freq: Three times a day (TID) | INTRAMUSCULAR | Status: DC | PRN

## 2022-08-26 MED ORDER — HALOPERIDOL 5 MG PO TABS: 5.0000 mg | ORAL_TABLET | Freq: Three times a day (TID) | ORAL | Status: DC | PRN

## 2022-08-26 MED ORDER — LORAZEPAM 2 MG/ML IJ SOLN: 2.0000 mg | Freq: Three times a day (TID) | INTRAMUSCULAR | Status: DC | PRN

## 2022-08-26 MED ORDER — CLONIDINE HCL 0.1 MG PO TABS
0.1000 mg | ORAL_TABLET | ORAL | Status: DC | PRN
  Administered 2022-08-26 – 2022-08-28 (×3): 0.1 mg via ORAL
  Filled 2022-08-26 (×3): qty 1

## 2022-08-26 MED ORDER — VITAMIN D 25 MCG (1000 UNIT) PO TABS: 5000.0000 [IU] | ORAL_TABLET | Freq: Every day | ORAL | Status: DC

## 2022-08-26 NOTE — BHH Counselor (Signed)
Pt attended [place] group. Pt was engaged and participated appropriately.

## 2022-08-26 NOTE — Progress Notes (Signed)
   08/26/22 2200  Psych Admission Type (Psych Patients Only)  Admission Status Involuntary  Psychosocial Assessment  Patient Complaints Suspiciousness  Eye Contact Brief  Facial Expression Anxious  Affect Anxious  Speech Tangential  Interaction Guarded  Motor Activity Slow  Appearance/Hygiene Unremarkable  Behavior Characteristics Cooperative  Mood Anxious;Depressed  Thought Process  Coherency Circumstantial  Content Blaming others  Delusions Persecutory  Perception UTA  Hallucination Auditory  Judgment Poor  Confusion WDL  Danger to Self  Current suicidal ideation? Denies  Agreement Not to Harm Self Yes  Description of Agreement verbal contract  Danger to Others  Danger to Others None reported or observed  Danger to Others Abnormal  Harmful Behavior to others Threats of violence towards other people observed or expressed   Description of Harmful Behavior Wants to hurt particular people  Destructive Behavior No threats or harm toward property   Alert/oriented. Makes needs/concerns known to staff. Pleasant cooperative with staff. Denies SI but states hears voices sometimes. Reports having HI towards specific individuals. Med compliant. Patient states went to group. Will encourage continued compliance and progression towards goals. Verbally contracted for safety. Will continue to monitor.

## 2022-08-26 NOTE — ED Notes (Signed)
Pt is awake and alert. He is complaining of back pain from the bed. Pt has also been complaining of psychiatric medication's side effects.  Pt given prn medication for pain and redirected.

## 2022-08-26 NOTE — Plan of Care (Signed)
  Problem: Safety: Goal: Periods of time without injury will increase Outcome: Progressing   

## 2022-08-26 NOTE — ED Notes (Signed)
Patient observed/assessed in bed/chair resting quietly appearing in no distress and verbalizing no complaints at this time. Will continue to monitor.  

## 2022-08-26 NOTE — Progress Notes (Signed)
   08/26/22 1208  Vital Signs  BP (!) 151/109  BP Location Left Arm  BP Method Automatic  Patient Position (if appropriate) Sitting   Pt's BP elevated upon admission. MD notified. 0.1mg  Clonidine ordered by MD and administered to patient. No s/s of distress or complaints from patient at this time.

## 2022-08-26 NOTE — ED Notes (Signed)
Pt is resting quietly at this time. Breathing even and unlabored and in view of nursing station.

## 2022-08-26 NOTE — ED Provider Notes (Signed)
FBC/OBS ASAP Discharge Summary  Date and Time: 08/26/2022 11:39 AM  Name: Charles Estes  MRN:  [id number]   Discharge Diagnoses:  Final diagnoses:  Bipolar disorder, current episode depressed, severe, with psychotic features ([place])   Charles Estes is a 62 y.o. male patient with a past psychiatric history of bipolar disorder, marijuana abuse, PTSD, and cocaine dependence who was admitted to [facility] after being transferred from [facility] where he initially presented with complaints of chest pain and dizziness. Patient then reported SI and HI and was placed under IVC by EDP Dr. Regenia Skeeter. Patient was medically cleared by [facility]. Patient was seen and evaluated by Merlyn Lot, NP and recommended for admission to [facility] continuous assessment.  Per IVC: "Patient presenting complaining of homicidal ideations. Repeats that he will "kill everyone in the house." Also says he will kill himself after. Says the homicide will be because they are "poisoning him with fentanyl."   Patient reassessed face-to-face by this provider and chart reviewed on 08/25/22.   Subjective:   Upon assessment patient is observed laying in his bed asleep.  He is easily awakened.  He is alert/oriented x and cooperative.  He has normal speech.  He is paranoid and delusional.  He is labile. He believes that he was with people that tried to kill him with fentanyl and physically drug him around the streets. States, "I am going to find them and kill them when I get out of here". When asked if he had a plan he states, "there are all kinds of ways when you live on the streets". He can not verbally contract for the safety of others. He is denying SI. He endorses auditory hallucinations of voices that are laughing calling him "super stupid". He does not appear to be responding to internal stimuli.   Stay Summary:   Patient meets criteria for inpatient psychiatric treatment.  He will remain under IVC.  He has been accepted to [facility] for  inpatient treatment.  He will be transported via Event organiser  Total Time spent with patient: 30 minutes  Past Psychiatric History: as documented in H&P Past Medical History: as documented in H&P Family History: as documented in H&P Family Psychiatric History: as documented in H&P Social History: as documented in H&P Tobacco Cessation:  N/A, patient does not currently use tobacco products  Current Medications:  Current Facility-Administered Medications  Medication Dose Route Frequency Provider Last Rate Last Admin   acetaminophen (TYLENOL) tablet 650 mg  650 mg Oral Q6H PRN Bobbitt, Shalon E, NP   650 mg at 08/26/22 0853   alum & mag hydroxide-simeth (MAALOX/MYLANTA) 200-200-20 MG/5ML suspension 30 mL  30 mL Oral Q4H PRN Bobbitt, Shalon E, NP       amLODipine (NORVASC) tablet 10 mg  10 mg Oral Daily Bobbitt, Shalon E, NP   10 mg at 08/26/22 [id number]   aspirin EC tablet 81 mg  81 mg Oral Daily Bobbitt, Shalon E, NP   81 mg at 08/26/22 0903   cholecalciferol (VITAMIN D3) 25 MCG (1000 UNIT) tablet 5,000 Units  5,000 Units Oral Q breakfast Bobbitt, Shalon E, NP   5,000 Units at 08/26/22 0852   FLUoxetine (PROZAC) capsule 10 mg  10 mg Oral Daily Bobbitt, Shalon E, NP   10 mg at 08/24/22 1014   gabapentin (NEURONTIN) capsule 200 mg  200 mg Oral TID Bobbitt, Shalon E, NP   200 mg at 08/26/22 0903   hydrOXYzine (ATARAX) tablet 25 mg  25 mg  Oral TID PRN Bobbitt, Shalon E, NP   25 mg at 08/23/22 2334   LORazepam (ATIVAN) tablet 1 mg  1 mg Oral PRN White, Patrice L, NP       And   ziprasidone (GEODON) injection 20 mg  20 mg Intramuscular PRN White, Patrice L, NP       OLANZapine zydis (ZYPREXA) disintegrating tablet 5 mg  5 mg Oral BID White, Patrice L, NP   5 mg at 08/25/22 2101   pantoprazole (PROTONIX) EC tablet 40 mg  40 mg Oral Daily Bobbitt, Shalon E, NP   40 mg at 08/26/22 0903   traZODone (DESYREL) tablet 50 mg  50 mg Oral QHS PRN Bobbitt, Shalon E, NP   50 mg at 08/25/22 2102   Current  Outpatient Medications  Medication Sig Dispense Refill   amLODipine (NORVASC) 10 MG tablet Take 1 tablet (10 mg total) by mouth daily. 30 tablet 0   aspirin EC 81 MG tablet Take 81 mg by mouth daily.     [START ON 08/27/2022] cholecalciferol (VITAMIN D3) 25 MCG (1000 UNIT) tablet Take 5 tablets (5,000 Units total) by mouth daily with breakfast.     gabapentin (NEURONTIN) 100 MG capsule Take 2 capsules (200 mg total) by mouth 3 (three) times daily.     OLANZapine zydis (ZYPREXA) 5 MG disintegrating tablet Take 1 tablet (5 mg total) by mouth 2 (two) times daily.     pantoprazole (PROTONIX) 40 MG tablet Take 1 tablet (40 mg total) by mouth daily. 30 tablet 0    PTA Medications:  Facility Ordered Medications  Medication   aspirin EC tablet 81 mg   cholecalciferol (VITAMIN D3) 25 MCG (1000 UNIT) tablet 5,000 Units   FLUoxetine (PROZAC) capsule 10 mg   pantoprazole (PROTONIX) EC tablet 40 mg   amLODipine (NORVASC) tablet 10 mg   acetaminophen (TYLENOL) tablet 650 mg   alum & mag hydroxide-simeth (MAALOX/MYLANTA) 200-200-20 MG/5ML suspension 30 mL   traZODone (DESYREL) tablet 50 mg   hydrOXYzine (ATARAX) tablet 25 mg   gabapentin (NEURONTIN) capsule 200 mg   OLANZapine zydis (ZYPREXA) disintegrating tablet 5 mg   LORazepam (ATIVAN) tablet 1 mg   And   ziprasidone (GEODON) injection 20 mg   [COMPLETED] OLANZapine zydis (ZYPREXA) disintegrating tablet 5 mg   [COMPLETED] menthol-cetylpyridinium (CEPACOL) lozenge 3 mg   PTA Medications  Medication Sig   aspirin EC 81 MG tablet Take 81 mg by mouth daily.   amLODipine (NORVASC) 10 MG tablet Take 1 tablet (10 mg total) by mouth daily.   pantoprazole (PROTONIX) 40 MG tablet Take 1 tablet (40 mg total) by mouth daily.   [START ON 08/27/2022] cholecalciferol (VITAMIN D3) 25 MCG (1000 UNIT) tablet Take 5 tablets (5,000 Units total) by mouth daily with breakfast.   gabapentin (NEURONTIN) 100 MG capsule Take 2 capsules (200 mg total) by mouth 3 (three)  times daily.   OLANZapine zydis (ZYPREXA) 5 MG disintegrating tablet Take 1 tablet (5 mg total) by mouth 2 (two) times daily.       08/24/2022    6:38 AM 08/21/2022    9:21 AM  Depression screen PHQ 2/9  Decreased Interest 1 0  Down, Depressed, Hopeless 1 3  PHQ - 2 Score 2 3  Altered sleeping 1 3  Tired, decreased energy 1 3  Change in appetite 1 1  Feeling bad or failure about yourself  1 3  Trouble concentrating 1 2  Moving slowly or fidgety/restless 1 3  Suicidal thoughts  1 2  PHQ-9 Score 9 20  Difficult doing work/chores Somewhat difficult     Flowsheet Row ED from 08/23/2022 in [facility] Most recent reading at 08/24/2022  6:38 AM ED from 08/23/2022 in [facility] Emergency Department at [facility] Most recent reading at 08/23/2022  2:27 PM ED from 07/12/2022 in [facility] Emergency Department at [facility] Most recent reading at 07/12/2022  3:26 AM  C-SSRS RISK CATEGORY High Risk High Risk No Risk       Musculoskeletal  Strength & Muscle Tone: within normal limits Gait & Station: normal Patient leans: N/A  Psychiatric Specialty Exam  Presentation  General Appearance:  Appropriate for Environment  Eye Contact: Good  Speech: Clear and Coherent; Normal Rate  Speech Volume: Normal  Handedness: Right   Mood and Affect  Mood: Irritable; Labile  Affect: Congruent   Thought Process  Thought Processes: Disorganized  Descriptions of Associations:Circumstantial  Orientation:Full (Time, Place and Person)  Thought Content:Illogical  Diagnosis of Schizophrenia or Schizoaffective disorder in past: Yes    Hallucinations:Hallucinations: Visual Description of Visual Hallucinations: "I see dead white people"  Ideas of Reference:None  Suicidal Thoughts:Suicidal Thoughts: No  Homicidal Thoughts:Homicidal Thoughts: Yes, Active HI Active Intent and/or Plan: With Intent   Sensorium  Memory: Immediate Fair;  Recent Fair; Remote Fair  Judgment: Poor  Insight: Poor   Executive Functions  Concentration: Fair  Attention Span: Fair  Recall: [employer] of Knowledge: Fair  Language: Fair   Psychomotor Activity  Psychomotor Activity: Psychomotor Activity: Normal   Assets  Assets: Armed forces logistics/support/administrative officer; Desire for Improvement; Financial Resources/Insurance; Transportation   Sleep  Sleep: Sleep: Fair Number of Hours of Sleep: 7   Nutritional Assessment (For OBS and FBC admissions only) Has the patient had a weight loss or gain of 10 pounds or more in the last 3 months?: No Has the patient had a decrease in food intake/or appetite?: No Does the patient have dental problems?: No Does the patient have eating habits or behaviors that may be indicators of an eating disorder including binging or inducing vomiting?: No Has the patient recently lost weight without trying?: 0 Has the patient been eating poorly because of a decreased appetite?: 0 Malnutrition Screening Tool Score: 0    Physical Exam  Physical Exam Vitals and nursing note reviewed.  Constitutional:      General: He is not in acute distress.    Appearance: He is well-developed.  HENT:     Head: Normocephalic and atraumatic.  Eyes:     General:        Right eye: No discharge.        Left eye: No discharge.     Conjunctiva/sclera: Conjunctivae normal.  Cardiovascular:     Rate and Rhythm: Normal rate.  Pulmonary:     Effort: Pulmonary effort is normal. No respiratory distress.  Musculoskeletal:        General: Normal range of motion.     Cervical back: Normal range of motion.  Skin:    Coloration: Skin is not jaundiced or pale.  Neurological:     Mental Status: He is alert and oriented to person, place, and time.  Psychiatric:        Attention and Perception: Attention and perception normal.        Mood and Affect: Mood is anxious.        Speech: Speech normal.        Behavior: Behavior is  cooperative.  Thought Content: Thought content is paranoid and delusional. Thought content includes homicidal ideation. Thought content includes homicidal plan.        Cognition and Memory: Cognition normal.        Judgment: Judgment is impulsive.    Review of Systems  Constitutional: Negative.   HENT: Negative.    Eyes: Negative.   Respiratory: Negative.    Cardiovascular: Negative.   Musculoskeletal: Negative.   Skin: Negative.   Neurological: Negative.   Psychiatric/Behavioral:  Positive for hallucinations. The patient is nervous/anxious.    Blood pressure (!) 131/97, pulse 86, temperature 98.4 F (36.9 C), resp. rate 18, SpO2 99 %. There is no height or weight on file to calculate BMI.    Disposition: Patient is criteria for inpatient psychiatric admission.  [facility] notified and patient has been accepted.  He will remain under involuntary commitment and he will be transported via Event organiser.  Revonda Humphrey, NP 08/26/2022, 11:39 AM

## 2022-08-26 NOTE — Progress Notes (Addendum)
[facility] Admission Note:  Charles Estes is a 62 year old male involuntarily admitted from [facility] on 08/26/22 due to homicidal ideations. According to ED note, pt repeatedly stated that "he will kill everyone in the house that is poisoning him with fentanyl and then he will kill himself after". Pt denies SI on admission. Pt reports that he is having AVH stating on admission stating, "I am seeing people that are telling me things and calling me stupid and stuff". Pt reports that he is still having HI towards "everyone that is poisoning me with fentanyl". Pt presents with speech that is circumstantial and presents with a flight of ideas during admission. Pt remained pleasant and cooperative throughout admission. Pt reports "I don't like taking these meds, Haldol, Depakote, Seroquel, Abilify, none of those work for me and I am not gonna take them, they treat me like an experiment". Pt reports that he has impaired vision and hearing in his right eye/ ear. Pt has a medical history of HTN, pt's BP elevated on admission at 151/109, MD notified. Pt reports that he has chronic back pain and walks slowly/ with care because of it. Pt reports that he is currently homeless and living out of his car. Pt's UDS positive for cocaine. Pt denies tobacco use. Pt reports that he only occasionally drinks on the weekends. Pt's skin assessed, pt has healed scars on bilateral arms. Pt's belongings searched and locked in locker. Pt oriented to the unit and provided with a meal try. Q 15 minute safety checks initiated.

## 2022-08-26 NOTE — Tx Team (Signed)
Initial Treatment Plan 08/26/2022 1:20 PM Charles Estes [id number]    PATIENT STRESSORS: Financial difficulties   Marital or family conflict   Medication change or noncompliance   Substance abuse     PATIENT STRENGTHS: Supportive family/friends    PATIENT IDENTIFIED PROBLEMS:   Homicidal Ideations towards "everyone poisoning me with fentanyl"    Visual and Auditory Hallucinations of "people telling me to do things and calling me names"    Homeless    Medication Noncompliance       DISCHARGE CRITERIA:  Adequate post-discharge living arrangements Motivation to continue treatment in a less acute level of care Safe-care adequate arrangements made  PRELIMINARY DISCHARGE PLAN: Outpatient therapy Participate in family therapy  PATIENT/FAMILY INVOLVEMENT: This treatment plan has been presented to and reviewed with the patient, Charles Estes. The patient has been given the opportunity to ask questions and make suggestions.  Lurline Del Sherrelle Prochazka, RN 08/26/2022, 1:20 PM

## 2022-08-26 NOTE — ED Notes (Signed)
Report called to Lorriane Shire RN at [facility] she verbalized understanding.  [employer] called for transport

## 2022-08-26 NOTE — Progress Notes (Signed)
Pt was accepted to [facility] 08/26/22; bed Assignment [id number]  Per [place] IVC paperwork has been faxed to [phone]  Pt meets inpatient criteria per Thomes Lolling, NP  Attending Physician will be Dr. Janine Limbo, MD  Report can be called to: -Adult unit: [phone]  Pt can arrive: [facility] will coordinate with care team.  Care Team notified: [facility] Lynnda Shields, RN, Leonia Reader, RN, Yehuda Budd, RN, Drema Halon, RN, [address] [place], LCSWA 08/26/2022 @ 10:50 AM

## 2022-08-27 DIAGNOSIS — F315 Bipolar disorder, current episode depressed, severe, with psychotic features: Secondary | ICD-10-CM | POA: Diagnosis not present

## 2022-08-27 MED ORDER — DIVALPROEX SODIUM 250 MG PO DR TAB
250.0000 mg | DELAYED_RELEASE_TABLET | Freq: Two times a day (BID) | ORAL | Status: DC
  Administered 2022-08-27 – 2022-08-30 (×6): 250 mg via ORAL
  Filled 2022-08-27 (×8): qty 1

## 2022-08-27 MED ORDER — ARIPIPRAZOLE 5 MG PO TABS
5.0000 mg | ORAL_TABLET | Freq: Every day | ORAL | Status: DC
  Administered 2022-08-27 – 2022-08-30 (×4): 5 mg via ORAL
  Filled 2022-08-27 (×5): qty 1

## 2022-08-27 MED ORDER — VITAMIN D 25 MCG (1000 UNIT) PO TABS
5000.0000 [IU] | ORAL_TABLET | Freq: Every day | ORAL | Status: DC
  Administered 2022-08-28 – 2022-09-04 (×8): 5000 [IU] via ORAL
  Filled 2022-08-27 (×10): qty 5
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
## 2022-08-27 NOTE — Group Note (Signed)
Recreation Therapy Group Note   Group Topic:Animal Assisted Therapy   Group Date: 08/27/2022 Start Time: [id number] End Time: 1030 Facilitators: Vashawn Ekstein-McCall, LRT,CTRS Location: [address] Dayroom   Animal-Assisted Activity (AAA) Program Checklist/Progress Notes Patient Eligibility Criteria Checklist & Daily Group note for Rec Tx Intervention  AAA/T Program Assumption of Risk Form signed by Patient/ or Parent Legal Guardian Yes  Patient understands his/her participation is voluntary Yes   Affect/Mood: N/A   Participation Level: Did not attend    Clinical Observations/Individualized Feedback:     Plan: Continue to engage patient in RT group sessions 2-3x/week.   Latoia Eyster-McCall, LRT,CTRS  08/27/2022 2:02 PM

## 2022-08-27 NOTE — Group Note (Signed)
Date:  08/27/2022 Time:  1:58 PM  Group Topic/Focus:  Peer Support    Participation Level:  Did Not Attend  Participation Quality:   n/a  Affect:   n/a  Cognitive:   n/a  Insight: None  Engagement in Group:   n/a  Modes of Intervention:   n/a  Additional Comments:   Did not attend  [place] 08/27/2022, 1:58 PM

## 2022-08-27 NOTE — Hospital Course (Addendum)
Admitted for: Under IVC for active SI and active HI Psychiatric Diagnoses: Bipolar 3 disorder, stimulant use disorder, tobacco use disorder Medical problems: Hypertension, GERD, HLD/CAD, neuropathic pain Hospital course: He was started on Depakote and Abilify on admission. Have titrated Depakote from 250 to 500 mgQ12. Abilify was titrated 5 to 10 mg QD. Reports he has no intent to kill, reports "I have intent to harm"  Dispo: Patient lives in truck, discharge is likely to [facility] by 4/5 tentatively

## 2022-08-27 NOTE — Progress Notes (Signed)
   08/27/22 0800  Psych Admission Type (Psych Patients Only)  Admission Status Involuntary  Psychosocial Assessment  Patient Complaints Anxiety;Suspiciousness  Eye Contact Brief  Facial Expression Anxious  Affect Anxious  Speech Unremarkable  Interaction Sarcastic;Forwards little  Motor Activity Slow  Appearance/Hygiene Unremarkable  Behavior Characteristics Cooperative  Mood Anxious;Depressed  Aggressive Behavior  Effect No apparent injury  Thought Process  Coherency Circumstantial  Content Blaming others  Delusions Persecutory  Perception Derealization  Hallucination None reported or observed  Judgment Impaired  Confusion Mild  Danger to Self  Current suicidal ideation? Denies  Agreement Not to Harm Self Yes  Description of Agreement verbal contract  Danger to Others  Danger to Others None reported or observed  Danger to Others Abnormal  Harmful Behavior to others No threats or harm toward other people  Destructive Behavior No threats or harm toward property

## 2022-08-27 NOTE — Group Note (Signed)
Date:  08/27/2022 Time:  10:26 AM  Group Topic/Focus:  Goals Group:   The focus of this group is to help patients establish daily goals to achieve during treatment and discuss how the patient can incorporate goal setting into their daily lives to aide in recovery. Orientation:   The focus of this group is to educate the patient on the purpose and policies of crisis stabilization and provide a format to answer questions about their admission.  The group details unit policies and expectations of patients while admitted.    Participation Level:  Did Not Attend  Participation Quality:   n/a  Affect:   n/a  Cognitive:   n/a  Insight: None  Engagement in Group:   n/a  Modes of Intervention:   n/a  Additional Comments:   Pt did not attend the Orientation/Goals group.  Wetzel Bjornstad Radford Pease 08/27/2022, 10:26 AM
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
## 2022-08-27 NOTE — Plan of Care (Signed)
  Problem: Education: Goal: Knowledge of [facility] Education information/materials will improve Outcome: Progressing Goal: Emotional status will improve Outcome: Progressing Goal: Mental status will improve Outcome: Progressing Goal: Verbalization of understanding the information provided will improve Outcome: Progressing   Problem: Activity: Goal: Interest or engagement in activities will improve Outcome: Progressing Goal: Sleeping patterns will improve Outcome: Progressing   

## 2022-08-27 NOTE — [facility] Counselor (Signed)
[facility] LCSW Progress Note   08/27/2022    3:48 PM  Charles Estes      Type of Note: [facility]/ Substance abuse Transitional housing:    CSW provided patient with a Therapist, sports of residential that accepts his insurance or that he could pay with since he is on disability. Patient said that he would look and call the ones the CSW had highlighted for him once he got up. CSW will continue to assist.     Signed:   Silas Flood, MSW, [facility] 08/27/2022 3:48 PM
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
## 2022-08-28 ENCOUNTER — Encounter (HOSPITAL_COMMUNITY): Payer: Self-pay

## 2022-08-28 DIAGNOSIS — F315 Bipolar disorder, current episode depressed, severe, with psychotic features: Secondary | ICD-10-CM | POA: Diagnosis not present

## 2022-08-28 LAB — HEMOGLOBIN A1C
Hgb A1c MFr Bld: 6.3 % — ABNORMAL HIGH (ref 4.8–5.6)
Mean Plasma Glucose: 134 mg/dL

## 2022-08-28 LAB — TSH: TSH: 1.599 u[IU]/mL (ref 0.350–4.500)

## 2022-08-28 MED ORDER — GABAPENTIN 100 MG PO CAPS
100.0000 mg | ORAL_CAPSULE | Freq: Three times a day (TID) | ORAL | Status: DC
  Administered 2022-08-28 – 2022-08-29 (×2): 100 mg via ORAL
  Filled 2022-08-28 (×5): qty 1

## 2022-08-28 MED ORDER — CLONIDINE HCL 0.1 MG PO TABS: 0.1000 mg | ORAL_TABLET | ORAL | Status: DC | PRN

## 2022-08-28 NOTE — [facility] Group Notes (Signed)
Pt attend N/A

## 2022-08-28 NOTE — Progress Notes (Signed)
   08/28/22 0601  15 Minute Checks  Location Bedroom  Visual Appearance Calm  Behavior Composed  Sleep (Behavioral Health Patients Only)  Calculate sleep? (Click Yes once per 24 hr at 0600 safety check) Yes  Documented sleep last 24 hours 8.75

## 2022-08-28 NOTE — Group Note (Signed)
Recreation Therapy Group Note   Group Topic:Team Building  Group Date: 08/28/2022 Start Time: 0930 End Time: 1000 Facilitators: Coburn Knaus-McCall, LRT,CTRS Location: [address] Dayroom   Goal Area(s) Addresses:  Patient will effectively work with peer towards shared goal.  Patient will identify skills used to make activity successful.  Patient will identify how skills used during activity can be applied to reach post d/c goals.   Group Description: [employer]. In teams of 5-6, patients were given 11 craft pipe cleaners. Using the materials provided, patients were instructed to compete again the opposing team(s) to build the tallest free-standing structure from floor level. The activity was timed; difficulty increased by Probation officer as Pharmacist, hospital continued.  Systematically resources were removed with additional directions for example, placing one arm behind their back, working in silence, and shape stipulations. LRT facilitated post-activity discussion reviewing team processes and necessary communication skills involved in completion. Patients were encouraged to reflect how the skills utilized, or not utilized, in this activity can be incorporated to positively impact support systems post discharge.   Affect/Mood: N/A   Participation Level: Did not attend    Clinical Observations/Individualized Feedback:      Plan: Continue to engage patient in RT group sessions 2-3x/week.   Nolah Krenzer-McCall, LRT,CTRS 08/28/2022 12:08 PM
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
## 2022-08-28 NOTE — Plan of Care (Signed)
Nurse discussed anxiety; depression and coping skills with patient.

## 2022-08-28 NOTE — Progress Notes (Signed)
D:  Patient's self inventory sheet, patient sleeps good, no sleep medication given.  Fair appetite, low energy level, poor concentration.  Rated depression 6, hopeless 9, anxiety 8.  Denied withdrawals.  SI, contracts for safety.  Physical problems, lightheaded, dizzy, headaches.  Worst pain in past 24 hours is #8, head and low back pain.  Goal is discharge.   Plans to take medicines.  Needs place to live. A:  Medicines administered per MD orders.  Emotional support and encouragement given patient. R:  Denied SI and HI, contracts for safety while talking to nurse this morning.  Denied A/V hallucinations.  Safety maintained with 15 minute checks.

## 2022-08-28 NOTE — [facility] Group Notes (Signed)
[facility] Group Notes:  (Nursing/MHT/Case Management/Adjunct)  Date:  08/28/2022  Time:  9:57 AM  Type of Therapy:  Group Therapy  Participation Level:  Did Not Attend  Summary of Progress/Problems:  Patient did not attend goals/ orientation group today.   Elza Rafter 08/28/2022, 9:57 AM

## 2022-08-29 DIAGNOSIS — F315 Bipolar disorder, current episode depressed, severe, with psychotic features: Secondary | ICD-10-CM | POA: Diagnosis not present

## 2022-08-29 MED ORDER — GABAPENTIN 100 MG PO CAPS
100.0000 mg | ORAL_CAPSULE | Freq: Two times a day (BID) | ORAL | Status: DC
  Administered 2022-08-29 – 2022-09-02 (×8): 100 mg via ORAL
  Filled 2022-08-29 (×10): qty 1

## 2022-08-29 NOTE — Group Note (Signed)
Date:  08/29/2022 Time:  5:18 PM  Group Topic/Focus:  Spirituality:   The focus of this group is to discuss how one's spirituality can aide in recovery.    Participation Level:  Active  Participation Quality:  Appropriate  Affect:  Appropriate  Cognitive:  Appropriate  Insight: Appropriate  Engagement in Group:  Engaged  Modes of Intervention:  Exploration  Additional Comments:     [name] 08/29/2022, 5:18 PM
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
## 2022-08-29 NOTE — Progress Notes (Signed)
   08/29/22 2100  Psych Admission Type (Psych Patients Only)  Admission Status Involuntary  Psychosocial Assessment  Patient Complaints Depression;Anxiety  Eye Contact Fair  Facial Expression Animated  Affect Blunted  Speech Logical/coherent  Interaction Assertive  Motor Activity Other (Comment)  Appearance/Hygiene Unremarkable  Behavior Characteristics Appropriate to situation  Mood Anxious  Thought Process  Coherency WDL  Content Blaming others  Delusions Persecutory  Perception WDL  Hallucination None reported or observed  Judgment Poor  Confusion None  Danger to Self  Current suicidal ideation? Denies  Agreement Not to Harm Self Yes  Description of Agreement verbal  Danger to Others  Danger to Others None reported or observed  Danger to Others Abnormal  Harmful Behavior to others Threats of violence towards other people observed or expressed   Description of Harmful Behavior Verbalizes hI towards some people outside of the facility   Alert/oriented. Makes needs/concerns known to staff. Pleasant cooperative with staff. Denies SI but states has HI towards some people outside of the facility. States hears voices sometimes telling him he is super stupid. Patient states went to group. Will encourage continued compliance and progression towards goals. Verbally contracted for safety. Will continue to monitor.

## 2022-08-29 NOTE — BH IP Treatment Plan (Signed)
Interdisciplinary Treatment and Diagnostic Plan Update  08/29/2022 Time of Session: 10:25 AM  Charles Estes MRN: [id number]  Principal Diagnosis: Bipolar disorder, curr episode depressed, severe, w/psychotic features ([place])  Secondary Diagnoses: Principal Problem:   Bipolar disorder, curr episode depressed, severe, w/psychotic features ([place]) Active Problems:   Cocaine use disorder ([place])   Generalized anxiety disorder with panic attacks   Cannabis use disorder   Current Medications:  Current Facility-Administered Medications  Medication Dose Route Frequency Provider Last Rate Last Admin   acetaminophen (TYLENOL) tablet 650 mg  650 mg Oral Q6H PRN Revonda Humphrey, NP       alum & mag hydroxide-simeth (MAALOX/MYLANTA) 200-200-20 MG/5ML suspension 30 mL  30 mL Oral Q4H PRN Revonda Humphrey, NP   30 mL at 08/28/22 2151   amLODipine (NORVASC) tablet 10 mg  10 mg Oral Daily Revonda Humphrey, NP   10 mg at 08/28/22 [id number]   ARIPiprazole (ABILIFY) tablet 5 mg  5 mg Oral Daily Carrion-Carrero, Caryl Ada, MD   5 mg at 08/28/22 [id number]   aspirin EC tablet 81 mg  81 mg Oral Daily Revonda Humphrey, NP   81 mg at 08/28/22 [id number]   cholecalciferol (VITAMIN D3) 25 MCG (1000 UNIT) tablet 5,000 Units  5,000 Units Oral Daily Massengill, Ovid Curd, MD   5,000 Units at 08/28/22 [id number]   cloNIDine (CATAPRES) tablet 0.1 mg  0.1 mg Oral Q4H PRN Janine Limbo, MD   0.1 mg at 08/28/22 0840   diphenhydrAMINE (BENADRYL) capsule 50 mg  50 mg Oral TID PRN Revonda Humphrey, NP       Or   diphenhydrAMINE (BENADRYL) injection 50 mg  50 mg Intramuscular TID PRN Revonda Humphrey, NP       divalproex (DEPAKOTE) DR tablet 250 mg  250 mg Oral Q12H Carrion-Carrero, Margely, MD   250 mg at 08/28/22 2112   gabapentin (NEURONTIN) capsule 100 mg  100 mg Oral TID Christene Slates, MD   100 mg at 08/28/22 1724   haloperidol (HALDOL) tablet 5 mg  5 mg Oral TID PRN Revonda Humphrey, NP       Or   haloperidol lactate  (HALDOL) injection 5 mg  5 mg Intramuscular TID PRN Revonda Humphrey, NP       LORazepam (ATIVAN) tablet 2 mg  2 mg Oral TID PRN Revonda Humphrey, NP       Or   LORazepam (ATIVAN) injection 2 mg  2 mg Intramuscular TID PRN Revonda Humphrey, NP       magnesium hydroxide (MILK OF MAGNESIA) suspension 30 mL  30 mL Oral Daily PRN Revonda Humphrey, NP       pantoprazole (PROTONIX) EC tablet 40 mg  40 mg Oral Daily Revonda Humphrey, NP   40 mg at 08/28/22 0842   traZODone (DESYREL) tablet 50 mg  50 mg Oral QHS PRN Revonda Humphrey, NP   50 mg at 08/26/22 2223   PTA Medications: Medications Prior to Admission  Medication Sig Dispense Refill Last Dose   amLODipine (NORVASC) 10 MG tablet Take 1 tablet (10 mg total) by mouth daily. 30 tablet 0    aspirin EC 81 MG tablet Take 81 mg by mouth daily.      cholecalciferol (VITAMIN D3) 25 MCG (1000 UNIT) tablet Take 5 tablets (5,000 Units total) by mouth daily with breakfast.      gabapentin (NEURONTIN) 100 MG capsule Take 2 capsules (200 mg total) by mouth 3 (three) times  daily.      OLANZapine zydis (ZYPREXA) 5 MG disintegrating tablet Take 1 tablet (5 mg total) by mouth 2 (two) times daily.      pantoprazole (PROTONIX) 40 MG tablet Take 1 tablet (40 mg total) by mouth daily. 30 tablet 0     Patient Stressors: Financial difficulties   Marital or family conflict   Medication change or noncompliance   Substance abuse    Patient Strengths: Supportive family/friends   Treatment Modalities: Medication Management, Group therapy, Case management,  1 to 1 session with clinician, Psychoeducation, Recreational therapy.   Physician Treatment Plan for Primary Diagnosis: Bipolar disorder, curr episode depressed, severe, w/psychotic features ([place]) Long Term Goal(s): Improvement in symptoms so as ready for discharge   Short Term Goals: Ability to identify changes in lifestyle to reduce recurrence of condition will improve Ability to verbalize  feelings will improve  Medication Management: Evaluate patient's response, side effects, and tolerance of medication regimen.  Therapeutic Interventions: 1 to 1 sessions, Unit Group sessions and Medication administration.  Evaluation of Outcomes: Not Progressing  Physician Treatment Plan for Secondary Diagnosis: Principal Problem:   Bipolar disorder, curr episode depressed, severe, w/psychotic features ([place]) Active Problems:   Cocaine use disorder ([place])   Generalized anxiety disorder with panic attacks   Cannabis use disorder  Long Term Goal(s): Improvement in symptoms so as ready for discharge   Short Term Goals: Ability to identify changes in lifestyle to reduce recurrence of condition will improve Ability to verbalize feelings will improve     Medication Management: Evaluate patient's response, side effects, and tolerance of medication regimen.  Therapeutic Interventions: 1 to 1 sessions, Unit Group sessions and Medication administration.  Evaluation of Outcomes: Not Progressing   RN Treatment Plan for Primary Diagnosis: Bipolar disorder, curr episode depressed, severe, w/psychotic features ([place]) Long Term Goal(s): Knowledge of disease and therapeutic regimen to maintain health will improve  Short Term Goals: Ability to remain free from injury will improve, Ability to verbalize frustration and anger appropriately will improve, Ability to demonstrate self-control, Ability to participate in decision making will improve, Ability to verbalize feelings will improve, Ability to disclose and discuss suicidal ideas, Ability to identify and develop effective coping behaviors will improve, and Compliance with prescribed medications will improve  Medication Management: RN will administer medications as ordered by provider, will assess and evaluate patient's response and provide education to patient for prescribed medication. RN will report any adverse and/or side effects to prescribing  provider.  Therapeutic Interventions: 1 on 1 counseling sessions, Psychoeducation, Medication administration, Evaluate responses to treatment, Monitor vital signs and CBGs as ordered, Perform/monitor CIWA, COWS, AIMS and Fall Risk screenings as ordered, Perform wound care treatments as ordered.  Evaluation of Outcomes: Not Progressing   LCSW Treatment Plan for Primary Diagnosis: Bipolar disorder, curr episode depressed, severe, w/psychotic features ([place]) Long Term Goal(s): Safe transition to appropriate next level of care at discharge, Engage patient in therapeutic group addressing interpersonal concerns.  Short Term Goals: Engage patient in aftercare planning with referrals and resources, Increase social support, Increase ability to appropriately verbalize feelings, Increase emotional regulation, Facilitate acceptance of mental health diagnosis and concerns, Facilitate patient progression through stages of change regarding substance use diagnoses and concerns, Identify triggers associated with mental health/substance abuse issues, and Increase skills for wellness and recovery  Therapeutic Interventions: Assess for all discharge needs, 1 to 1 time with Social worker, Explore available resources and support systems, Assess for adequacy in community support network, Educate family and  significant other(s) on suicide prevention, Complete Psychosocial Assessment, Interpersonal group therapy.  Evaluation of Outcomes: Not Progressing   Progress in Treatment: Attending groups: No. Participating in groups: No. Taking medication as prescribed: Yes. Toleration medication: Yes. Family/Significant other contact made: No, will contact:  Pt  Daughter Graiden Claussen  Patient understands diagnosis: Yes. Discussing patient identified problems/goals with staff: Yes. Medical problems stabilized or resolved: Yes. Denies suicidal/homicidal ideation: Yes. Issues/concerns per patient self-inventory: No.  New  problem(s) identified: No, Describe:  None reported   New Short Term/Long Term Goal(s): medication stabilization, elimination of SI thoughts, development of comprehensive mental wellness plan.    Patient Goals:  Did not attend  Discharge Plan or Barriers: Patient recently admitted. CSW will continue to follow and assess for appropriate referrals and possible discharge planning.    Reason for Continuation of Hospitalization: Aggression Delusions  Depression Homicidal ideation [place] Medication stabilization  Estimated Length of Stay: 5-7 days  Last [place] Suicide Severity Risk Score: [place] Admission (Current) from 08/26/2022 in [place] [facility] Most recent reading at 08/26/2022  1:15 PM ED from 08/23/2022 in [facility] Most recent reading at 08/24/2022  6:38 AM ED from 08/23/2022 in [facility] Emergency Department [facility] Most recent reading at 08/23/2022  2:27 PM  C-SSRS RISK CATEGORY Moderate Risk High Risk High Risk       Last PHQ 2/9 Scores:    08/24/2022    6:38 AM 08/21/2022    9:21 AM  Depression screen PHQ 2/9  Decreased Interest 1 0  Down, Depressed, Hopeless 1 3  PHQ - 2 Score 2 3  Altered sleeping 1 3  Tired, decreased energy 1 3  Change in appetite 1 1  Feeling bad or failure about yourself  1 3  Trouble concentrating 1 2  Moving slowly or fidgety/restless 1 3  Suicidal thoughts 1 2  PHQ-9 Score 9 20  Difficult doing work/chores Somewhat difficult     Scribe for Treatment Team: Charlett Lango 08/29/2022 8:15 AM

## 2022-08-29 NOTE — [facility] Group Notes (Signed)
PsychoEducational Group Patients were asked to reflect on how to promote wellbeing with healthy lifestyle choices. Education regarding healthy sleep habits, and lifestyle impact mental health. Conversely patients were then asked to identify warning signs of when they are not well and how to integrate healthy coping skills/lifestyle to help.  Patient did not attend.

## 2022-08-29 NOTE — Plan of Care (Signed)
  Problem: Safety: Goal: Periods of time without injury will increase Outcome: Progressing   

## 2022-08-29 NOTE — Group Note (Signed)
Date:  08/29/2022 Time:  6:56 PM  Group Topic/Focus:  Orientation:   The focus of this group is to educate the patient on the purpose and policies of crisis stabilization and provide a format to answer questions about their admission.  The group details unit policies and expectations of patients while admitted.    Participation Level:  Active  Participation Quality:  Appropriate  Affect:  Appropriate  Cognitive:  Appropriate  Insight: Appropriate  Engagement in Group:  Engaged  Modes of Intervention:  Discussion  Additional Comments:     [name] 08/29/2022, 6:56 PM

## 2022-08-29 NOTE — Progress Notes (Signed)
D- Patient alert and oriented x4. Patient in stable mood.  Denies SI, HI, AVH. Pt complained of some heartburn and nightmares. He described his nightmares of the "devil chasing him all night when he was trying to hurt people in the house."   A- Scheduled medications and PRNS administered to patient, per MD orders.Routine safety checks conducted every 15 minutes.  Patient informed to notify staff with problems or concerns.   R- Patient compliant with medications and verbalized understanding treatment plan. Patient receptive, calm, and cooperative.       08/28/22 2110  Psych Admission Type (Psych Patients Only)  Admission Status Involuntary  Psychosocial Assessment  Patient Complaints Anxiety;Suspiciousness;Worrying  Eye Contact Glaring  Facial Expression Animated  Affect Blunted  Speech Logical/coherent  Interaction Sarcastic;Forwards little  Motor Activity Slow  Appearance/Hygiene Unremarkable  Behavior Characteristics Appropriate to situation  Mood Suspicious  Thought Process  Coherency WDL  Content Blaming others  Delusions Persecutory  Perception WDL  Hallucination None reported or observed  Judgment Poor  Confusion None  Danger to Self  Current suicidal ideation? Denies  Danger to Others  Danger to Others None reported or observed  Danger to Others Abnormal  Harmful Behavior to others No threats or harm toward other people

## 2022-08-29 NOTE — [facility] Group Notes (Signed)
[place] Group Notes:  (Nursing/MHT/Case Management/Adjunct)  Date:  08/29/2022  Time:  9:01 PM  Type of Therapy:  Group Therapy  Participation Level:  Minimal  Participation Quality:  Attentive  Affect:  Appropriate  Cognitive:  Appropriate  Insight:  Appropriate  Engagement in Group:  Limited  Modes of Intervention:  Discussion  Summary of Progress/Problems: No goal to share  Chase Picket 08/29/2022, 9:01 PM

## 2022-08-29 NOTE — Progress Notes (Signed)
Pt on unit, complains of nightmare last night of devil chasing him. Pt endorses passive suicidal thoughts as well as homicidal thoughts towards the same people. Pt does contract for safety, Q 15 minute checks ongoing.

## 2022-08-30 LAB — VALPROIC ACID LEVEL: Valproic Acid Lvl: 29 ug/mL — ABNORMAL LOW (ref 50.0–100.0)

## 2022-08-30 MED ORDER — HALOPERIDOL 5 MG PO TABS
5.0000 mg | ORAL_TABLET | Freq: Two times a day (BID) | ORAL | Status: DC
  Administered 2022-08-30: 5 mg via ORAL
  Filled 2022-08-30 (×5): qty 1

## 2022-08-30 MED ORDER — PRAZOSIN HCL 1 MG PO CAPS
1.0000 mg | ORAL_CAPSULE | Freq: Every day | ORAL | Status: DC
  Administered 2022-08-30: 1 mg via ORAL
  Filled 2022-08-30 (×4): qty 1

## 2022-08-30 MED ORDER — TRAZODONE HCL 100 MG PO TABS
100.0000 mg | ORAL_TABLET | Freq: Every evening | ORAL | Status: DC | PRN
  Administered 2022-08-31 – 2022-09-03 (×4): 100 mg via ORAL
  Filled 2022-08-30 (×4): qty 1

## 2022-08-30 MED ORDER — DIVALPROEX SODIUM 500 MG PO DR TAB
500.0000 mg | DELAYED_RELEASE_TABLET | Freq: Two times a day (BID) | ORAL | Status: DC
  Administered 2022-08-30 – 2022-09-04 (×10): 500 mg via ORAL
  Filled 2022-08-30 (×12): qty 1

## 2022-08-30 MED ORDER — ARIPIPRAZOLE 10 MG PO TABS
10.0000 mg | ORAL_TABLET | Freq: Every day | ORAL | Status: DC
  Administered 2022-08-31 – 2022-09-04 (×5): 10 mg via ORAL
  Filled 2022-08-30 (×6): qty 1

## 2022-08-30 NOTE — Plan of Care (Signed)
  Problem: Education: Goal: Knowledge of [facility] Education information/materials will improve Outcome: Progressing Goal: Emotional status will improve Outcome: Progressing Goal: Mental status will improve Outcome: Progressing Goal: Verbalization of understanding the information provided will improve Outcome: Progressing   Problem: Activity: Goal: Interest or engagement in activities will improve Outcome: Progressing Goal: Sleeping patterns will improve Outcome: Progressing   Problem: Coping: Goal: Ability to verbalize frustrations and anger appropriately will improve Outcome: Progressing Goal: Ability to demonstrate self-control will improve Outcome: Progressing   Problem: Health Behavior/Discharge Planning: Goal: Identification of resources available to assist in meeting health care needs will improve Outcome: Progressing Goal: Compliance with treatment plan for underlying cause of condition will improve Outcome: Progressing   Problem: Physical Regulation: Goal: Ability to maintain clinical measurements within normal limits will improve Outcome: Progressing   Problem: Safety: Goal: Periods of time without injury will increase Outcome: Progressing   Problem: Education: Goal: Utilization of techniques to improve thought processes will improve Outcome: Progressing Goal: Knowledge of the prescribed therapeutic regimen will improve Outcome: Progressing   Problem: Activity: Goal: Interest or engagement in leisure activities will improve Outcome: Progressing Goal: Imbalance in normal sleep/wake cycle will improve Outcome: Progressing   Problem: Coping: Goal: Coping ability will improve Outcome: Progressing Goal: Will verbalize feelings Outcome: Progressing   Problem: Health Behavior/Discharge Planning: Goal: Ability to make decisions will improve Outcome: Progressing Goal: Compliance with therapeutic regimen will improve Outcome: Progressing    Problem: Role Relationship: Goal: Will demonstrate positive changes in social behaviors and relationships Outcome: Progressing   Problem: Safety: Goal: Ability to disclose and discuss suicidal ideas will improve Outcome: Progressing Goal: Ability to identify and utilize support systems that promote safety will improve Outcome: Progressing   Problem: Self-Concept: Goal: Will verbalize positive feelings about self Outcome: Progressing Goal: Level of anxiety will decrease Outcome: Progressing   Problem: Education: Goal: Ability to state activities that reduce stress will improve Outcome: Progressing   Problem: Coping: Goal: Ability to identify and develop effective coping behavior will improve Outcome: Progressing   Problem: Self-Concept: Goal: Ability to identify factors that promote anxiety will improve Outcome: Progressing Goal: Level of anxiety will decrease Outcome: Progressing Goal: Ability to modify response to factors that promote anxiety will improve Outcome: Progressing   

## 2022-08-30 NOTE — Group Note (Signed)
Recreation Therapy Group Note   Group Topic:Stress Management  Group Date: 08/30/2022 Start Time: 0930 End Time: 0955 Facilitators: Assia Meanor-McCall, LRT,CTRS Location: [address] Dayroom   Goal Area(s) Addresses:  Patient will identify positive stress management techniques. Patient will identify benefits of using stress management post d/c.  Group Description:  Meditation.  LRT explained to patients what group would consist of, they should focus on their breathing and be in a comfortable position.  LRT proceeded to play a meditation called 'Stop Holding Yourself Back'.  The meditation wanted patients to identify habits they that have prevented them from the life they want for themselves.  It also encouraged to hold onto the positive image they have of their future selves and to working to get to that point.   Affect/Mood: N/A   Participation Level: Did not attend    Clinical Observations/Individualized Feedback:     Plan: Continue to engage patient in RT group sessions 2-3x/week.   Yaresly Menzel-McCall, LRT,CTRS 08/30/2022 12:32 PM

## 2022-08-30 NOTE — Progress Notes (Signed)
   08/30/22 2245  Psych Admission Type (Psych Patients Only)  Admission Status Involuntary  Psychosocial Assessment  Patient Complaints None  Eye Contact Fair  Facial Expression Animated  Affect Blunted  Speech Logical/coherent  Interaction Assertive  Motor Activity Other (Comment)  Appearance/Hygiene Unremarkable  Behavior Characteristics Cooperative  Mood Anxious  Thought Process  Coherency WDL  Content Blaming others  Delusions Persecutory  Perception WDL  Hallucination None reported or observed  Judgment Poor  Confusion None  Danger to Self  Current suicidal ideation? Denies  Agreement Not to Harm Self Yes  Description of Agreement verbal  Danger to Others  Danger to Others None reported or observed  Danger to Others Abnormal  Harmful Behavior to others Threats of violence towards other people observed or expressed    Alert/oriented. Makes needs/concerns known to staff. Pleasant cooperative with staff. Denies SI/ but states wants to find those men who left him for dead.  Med compliant. PRN med given with good effect. Patient states "wants to find those men". Will encourage compliance and progression towards goals. Verbally contracted for safety. Will continue to monitor.

## 2022-08-30 NOTE — [facility] Group Notes (Signed)
Adult Psychoeducational Group Note  Date:  08/30/2022 Time:  1:08 PM  Group Topic/Focus:  Goals Group:   The focus of this group is to help patients establish daily goals to achieve during treatment and discuss how the patient can incorporate goal setting into their daily lives to aide in recovery.  Participation Level:  Active  Participation Quality:  Appropriate and Intrusive  Affect:  Appropriate  Cognitive:  Appropriate  Insight: Appropriate  Engagement in Group:  Engaged  Modes of Intervention:  Activity and Discussion  Additional Comments:  Pt stated that his goal for today is to get medication adjusted as needed.   Charles Estes [name] 08/30/2022, 1:08 PM
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
## 2022-08-30 NOTE — Progress Notes (Signed)
Patient appears preoccupied. Patient endorses SI on his self inventory and denied it during morning med pass. Pt reports HI toward the "people that left me to die". Pt reports that he has been seeing demons, shadows, and that his room is a dungeon. Pt has been hearing voices saying "take it, take it". Pt has been preoccupied with getting shoes and making appointments over the phone. Pt speech is rapid and pressured. Pt reports good sleep and appetite. Patient complied with morning medication with no reported side effects. Pt agreed to take morning dose of haldol but stated that it made him sleepy and he calls it "hound dog". Patient remains safe on Q53min checks and contracts for safety.      08/30/22 1056  Psych Admission Type (Psych Patients Only)  Admission Status Involuntary  Psychosocial Assessment  Patient Complaints Anxiety;Sleep disturbance;Suspiciousness;Worrying  Eye Contact Fair  Facial Expression Animated  Affect Blunted  Speech Logical/coherent;Rapid  Interaction Assertive  Motor Activity Fidgety  Appearance/Hygiene Unremarkable  Behavior Characteristics Anxious  Mood Preoccupied  Thought Process  Coherency WDL  Content Blaming others  Delusions Persecutory  Perception Hallucinations  Hallucination Visual;Auditory  Judgment Poor  Confusion None  Danger to Self  Current suicidal ideation? Denies  Agreement Not to Harm Self Yes  Description of Agreement verbal  Danger to Others  Danger to Others Reported or observed  Danger to Others Abnormal  Harmful Behavior to others Threats of violence towards other people observed or expressed   Description of Harmful Behavior wants to kill the people who hurt him  Destructive Behavior No threats or harm toward property

## 2022-08-30 NOTE — B[facility] Counselor (Signed)
[facility] LCSW Progress Note   08/30/2022    1:19 PM  Charles Estes      Type of Note: [employer]    CSW checked in with patient today about [place] housing; Patient stated that he did a interview with [employer] located in [place], [place] and got accepted but was going to call somewhere else due to the tone of the guy who was interviewing him. CSW asked patient if he needed any assistance with anything and patient declined at this time. CSW will continue to assist.     Signed:   Silas Flood, MSW, LCSWA 08/30/2022 1:19 PM

## 2022-08-30 NOTE — Progress Notes (Signed)
   08/30/22 2245  Psych Admission Type (Psych Patients Only)  Admission Status Involuntary  Psychosocial Assessment  Patient Complaints None  Eye Contact Fair  Facial Expression Animated  Affect Blunted  Speech Logical/coherent  Interaction Assertive  Motor Activity Other (Comment)  Appearance/Hygiene Unremarkable  Behavior Characteristics Cooperative  Mood Anxious  Thought Process  Coherency WDL  Content Blaming others  Delusions Persecutory  Perception WDL  Hallucination None reported or observed  Judgment Poor  Confusion None  Danger to Self  Current suicidal ideation? Denies  Agreement Not to Harm Self Yes  Description of Agreement verbal  Danger to Others  Danger to Others None reported or observed  Danger to Others Abnormal  Harmful Behavior to others Threats of violence towards other people observed or expressed    Alert/oriented. Makes needs/concerns known to staff. Pleasant cooperative with staff. Denies SI/HI/A/V hallucinations. Med compliant. Patient states went to group. Will encourage continue compliance and progression towards goals. Verbally contracted for safety. Will continue to monitor.

## 2022-08-30 NOTE — [facility] Group Notes (Signed)
[facility] Group Notes:  (Nursing/MHT/Case Management/Adjunct)  Date:  08/30/2022  Time:  8:29 PM  Type of Therapy:   AA  Participation Level:  Active  Participation Quality:  Appropriate  Affect:  Appropriate  Cognitive:  Appropriate  Insight:  Appropriate  Engagement in Group:  Engaged  Modes of Intervention:  Education  Summary of Progress/Problems: Attended AA group.  Orvan Falconer 08/30/2022, 8:29 PM

## 2022-08-31 NOTE — Progress Notes (Signed)
[facility] MD Progress Note  08/31/2022 1:17 PM [name]  MRN:  [id number]  Principal Problem: Bipolar disorder, curr episode depressed, severe, w/psychotic features Diagnosis: Principal Problem:   Bipolar disorder, curr episode depressed, severe, w/psychotic features (Charles Estes) Active Problems:   Cocaine use disorder ([place])   Generalized anxiety disorder with panic attacks   Cannabis use disorder   Reason for Admission:  Charles Estes is a 62 yo male with a self-reported PPHx of bipolar type III and stimulant use disorder presenting to [facility] on 08/27/2022 in the setting of recent drug use and reporting active suicidal ideation and active homicidal ideation with the intent of killing a group of acquaintances he believes left him for dead in his car, then killing himself.   (admitted on 08/26/2022, total  LOS: 5 days )  24 hour review: The chart was reviewed and the case was discussed with nursing staff.  Given his persistent nightmares, he was started on Minipress 1 mg at night and he slept well but this morning staff reports that his blood pressure has been dropping.  They held his Norvasc.  His blood pressure most recently taken was 99/77 with a pulse rate of 101.  He was strongly advised not to move around without assistance if he feels dizzy.  He has been compliant with medications.  He denies psychosis.  His depression is improving but today he is more dizzy and having hypotensive episodes.  Pertinent information discussed during bed progression:  Patient evaluated on the unit, when seen today he was alert oriented and cooperative but was sitting in bed.  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  He did not see any demons at this time.  He did not have any nightmares. He endorses improving depressive symptoms and reports that he still has some vague homicidal thoughts.  He is contracting for safety.  No  active hallucinations are noted.  Past Psychiatric Hx: Current Psychiatrist: Denies Current Therapist: Denies Previous Psych Diagnoses: Bipolar type III Current psychiatric medications: Denies Psychiatric medication history: Haldol-reports it makes him feel "crazy" Abilify and Depakote, reports he last took these medications a months ago.  Reports both of these medications were tolerated well.  Reports that the only reason he discontinued taking them was because he thought he had been cured and felt better enough to stop. Seroquel-reports he has used this in the past for sleep, reports it is effective. Prior inpatient treatment: Reported previous hospitalization at [facility] in 01/19/2022 for worsening depression, suicidal ideation, and homicidal ideation. "Pt was medically cleared, and transferred voluntarily to this [facility] for treatment and stabilization of his mood. " At that time he was diagnosed with bipolar 1 disorder recurrent, severe with psychotic features "He reports that he took himself to the ER, and that he had suicidal thoughts with a plan to lay on the railroad tracks and wait for a train to run him over. He reports worsening suicidal thoughts, recurrent thoughts of death along with feelings of hopelessness, worthlessness, helplessness, anxiety, poor concentration, anhedonia, low energy levels, irritability , insomnia & crying spells over the past 3 weeks. He also reports worsening auditory and visual hallucinations & feelings of paranoia during this time period. He reports that the last time that he had +VH was last night when he saw "a little man" walk into his room here on the unit. He also reports +  VH & +AH of his deceased mother, and states that he has been seeing her more frequently lately. He also reports that his paranoia has worsened, and he feels as though people are out to harm him. " History of suicide: as described in the HPI History of homicide or aggression: as described in  the HPI Psychiatric medication compliance history: As described in HPI Past Medical History:  Past Medical History:  Diagnosis Date   Depression    GERD (gastroesophageal reflux disease)    Hypertension    Pathologic ulnar fracture with malunion    right   Family History:  Family History  Problem Relation Age of Onset   Hypertension Mother    Heart attack Mother    Hypertension Father    Heart attack Father    Heart attack Sister    Hypertension Sister    Heart attack Sister    Hypertension Brother    Family Psychiatric History:  Reports extensive history of violence in the family.  Reports he has a brother that killed another brother.  Has 3 nephews in prison.  Reports his mother shot his father as a child. Mother: Hypertension, myocardial infarction Father: Hypertension, myocardial infarction, strokes   Family psychiatric history: Unable to recall, specify any history of psychiatric illness in the family.   Social History: Reports he has been living in [place] for the past 3 years. Education: High school Work: Unemployed Marital Status: Divorced Children: Reports he has 2 children and 4 grandchildren. Current Medications: Current Facility-Administered Medications  Medication Dose Route Frequency Provider Last Rate Last Admin   acetaminophen (TYLENOL) tablet 650 mg  650 mg Oral Q6H PRN Revonda Humphrey, NP   650 mg at 08/30/22 1030   alum & mag hydroxide-simeth (MAALOX/MYLANTA) 200-200-20 MG/5ML suspension 30 mL  30 mL Oral Q4H PRN Revonda Humphrey, NP   30 mL at 08/31/22 0648   amLODipine (NORVASC) tablet 10 mg  10 mg Oral Daily Revonda Humphrey, NP   10 mg at 08/30/22 0753   ARIPiprazole (ABILIFY) tablet 10 mg  10 mg Oral Daily Ranae Palms, MD   10 mg at 08/31/22 [id number]   aspirin EC tablet 81 mg  81 mg Oral Daily Revonda Humphrey, NP   81 mg at 08/31/22 [id number]   cholecalciferol (VITAMIN D3) 25 MCG (1000 UNIT) tablet 5,000 Units  5,000 Units Oral Daily  Massengill, Ovid Curd, MD   5,000 Units at 08/31/22 [id number]   cloNIDine (CATAPRES) tablet 0.1 mg  0.1 mg Oral Q4H PRN Massengill, Ovid Curd, MD   0.1 mg at 08/28/22 0840   diphenhydrAMINE (BENADRYL) capsule 50 mg  50 mg Oral TID PRN Revonda Humphrey, NP       Or   diphenhydrAMINE (BENADRYL) injection 50 mg  50 mg Intramuscular TID PRN Revonda Humphrey, NP       divalproex (DEPAKOTE) DR tablet 500 mg  500 mg Oral Q12H Ranae Palms, MD   500 mg at 08/31/22 [id number]   gabapentin (NEURONTIN) capsule 100 mg  100 mg Oral BID Carrion-Carrero, Caryl Ada, MD   100 mg at 08/31/22 0817   haloperidol (HALDOL) tablet 5 mg  5 mg Oral TID PRN Revonda Humphrey, NP       Or   haloperidol lactate (HALDOL) injection 5 mg  5 mg Intramuscular TID PRN Revonda Humphrey, NP       LORazepam (ATIVAN) tablet 2 mg  2 mg Oral TID PRN Revonda Humphrey, NP  Or   LORazepam (ATIVAN) injection 2 mg  2 mg Intramuscular TID PRN Revonda Humphrey, NP       magnesium hydroxide (MILK OF MAGNESIA) suspension 30 mL  30 mL Oral Daily PRN Revonda Humphrey, NP       pantoprazole (PROTONIX) EC tablet 40 mg  40 mg Oral Daily Thomes Lolling H, NP   40 mg at 08/31/22 [id number]   prazosin (MINIPRESS) capsule 1 mg  1 mg Oral QHS Carrion-Carrero, Margely, MD   1 mg at 08/30/22 2135   traZODone (DESYREL) tablet 100 mg  100 mg Oral QHS PRN Carrion-Carrero, Caryl Ada, MD        Lab Results:  Results for orders placed or performed during the hospital encounter of 08/26/22 (from the past 48 hour(s))  Valproic acid level     Status: Abnormal   Collection Time: 08/30/22  6:23 AM  Result Value Ref Range   Valproic Acid Lvl 29 (L) 50.0 - 100.0 ug/mL    Comment: Performed at [facility], [place] [address], [place], [place] [address]    Blood Alcohol level:  Lab Results  Component Value Date   [facility] <10 08/23/2022   ETH <10 [id number]    Metabolic Labs: Lab Results  Component Value Date   HGBA1C 6.3 (H) 08/28/2022   MPG  134 08/28/2022   MPG 125.5 01/21/2022   No results found for: "PROLACTIN" Lab Results  Component Value Date   CHOL 199 08/23/2022   TRIG 69 08/23/2022   HDL 43 08/23/2022   CHOLHDL 4.6 08/23/2022   VLDL 14 08/23/2022   LDLCALC 142 (H) 08/23/2022   LDLCALC 116 (H) 03/24/2022    Sleep:Sleep: Good    CIWA:    COWS:     Psychiatric Specialty Exam:   Presentation  General Appearance: Appropriate for Environment; Casual; Fairly Groomed   Eye Contact:Fair   Speech:Clear and Coherent; Normal Rate   Speech Volume:Normal   Handedness:Right     Mood and Affect  Mood:-- ("The devil is chasing me in my dreams")   Affect: Restricted     Thought Process  Thought Processes:Coherent; Goal Directed; Linear   Descriptions of Associations:Intact   Orientation:Full (Time, Place and Person)   Thought Content: Paranoid ideations, no delusions present.   History of Schizophrenia/Schizoaffective disorder:No   Duration of Psychotic Symptoms:N/A   Hallucinations:Hallucinations: None   Ideas of Reference:None   Suicidal Thoughts:Suicidal Thoughts: Denies SI Active Intent and/or Plan: Denies SI Passive Intent and/or Plan: Denies   Homicidal Thoughts:Homicidal Thoughts: Yes, passive HI passive intent and/or Plan: Without plan; with intent     Sensorium  Memory:Immediate Fair   Judgment:Impaired   Insight:lacking, improved from prior     Executive Functions  Concentration:Fair   Attention Span:Fair   Alex     Psychomotor Activity  Psychomotor Activity:Psychomotor Activity: Normal     Assets  Assets:Communication Skills; Desire for Improvement; Resilience     Sleep  Sleep:Sleep: Good    Physical Exam: Physical Exam Vitals and nursing note reviewed.  Constitutional:      General: He is not in acute distress.    Appearance: He is not ill-appearing.  HENT:     Head: Normocephalic and atraumatic.   Pulmonary:     Effort: Pulmonary effort is normal. No respiratory distress.  Musculoskeletal:        General: Normal range of motion.  Neurological:     Mental Status: He is  alert.    Review of Systems  Eyes:  Positive for blurred vision.  Respiratory:  Negative for shortness of breath.   Cardiovascular:  Negative for chest pain.  Gastrointestinal:  Negative for abdominal pain, constipation and diarrhea.  Neurological:  Negative for dizziness.  Psychiatric/Behavioral:  Positive for hallucinations. Negative for depression, memory loss, substance abuse and suicidal ideas. The patient is not nervous/anxious and does not have insomnia.    Blood pressure 99/77, pulse (!) 101, temperature 98.3 F (36.8 C), temperature source Oral, resp. rate 20, height 5\' 5"  (1.651 m), weight 89.8 kg, SpO2 100 %. Body mass index is 32.95 kg/m.  Treatment Plan Summary: Daily contact with patient to assess and evaluate symptoms and progress in treatment and Medication management   ASSESSMENT: Lexander Zirk is a 62 yo male with a self-reported PPHx of bipolar type III, presenting to [facility] on 08/27/2022 in the setting of recent drug use and reporting active suicidal ideation and active homicidal ideation with the intent of killing a group of acquaintances he believes left him for dead in his car, then killing himself.    Diagnoses / Active Problems: Bipolar disorder, current episode depressed, with psychotic features Cocaine use disorder Cannabis use disorder GAD with panic attacks   PLAN: Safety and Monitoring:             -- INVOLUNTARY admission to inpatient psychiatric unit for safety, stabilization and treatment             -- Daily contact with patient to assess and evaluate symptoms and progress in treatment             -- Patient's case to be discussed in multi-disciplinary team meeting             -- Observation Level : q15 minute checks             -- Vital signs:  q12 hours             --  Precautions: suicide, elopement, and assault   2. Psychiatric Diagnoses and Treatment:  Increase Depakote 250 every 12 hours to 500 mg every 12 hours for mood stabilization Depakote level 3/29: 29, subtherapeutic Increase Abilify 5 mg to 10 mg daily as augmentative therapy Start Prazosin 1 mg qhs for night terrors, improve sleep hygiene Continue trazodone 50 mg nightly as needed for insomnia Will consider increasing to 100 mg Continue Gabapentin 100 mg BID Received Haldol 5 mg 1x for paranoia Discontinued Zyprexa 08/28/2022 08/31/2022 :Patient continues to have postural hypotension.  The plan he is: Obtain an EKG Hold Norvasc If symptoms do not improve titrate medications that are causing her hypertension.  He may need to discontinue the Minipress temporarily until he is stabilized. -- The risks/benefits/side-effects/alternatives to this medication were discussed in detail with the patient and time was given for questions. The patient consents to medication trial.              -- Metabolic profile and EKG monitoring obtained while on an atypical antipsychotic  BMI: 32.95 kg/m TSH: 1.599, WNL Lipid Panel: LDL 142, all other values WNL HbgA1c: 6.3% QTc: 486             -- Encouraged patient to participate in unit milieu and in scheduled group therapies              -- Short Term Goals: Ability to identify changes in lifestyle to reduce recurrence of condition will improve and Ability to verbalize feelings will  improve             -- Long Term Goals: Improvement in symptoms so as ready for discharge              3. Medical Issues Being Addressed:    Tobacco Use Disorder Nicotine patch 21mg /24 hours ordered Smoking cessation encouraged   Essential hypertension VSS Amlodipine 10 mg daily Clonidine PRN, for sbp >180 and dbp>100    GERD Continue home pantoprazole 40 mg daily   HLD CAD LDL-142 Continue ASA 81 mg daily Consider starting high intensity statin   Neuropathic  pain Continue home gabapentin 100 mg 3 times daily   4. Discharge Planning:              -- Social work and case management to assist with discharge planning and identification of hospital follow-up needs prior to discharge             -- Estimated LOS: Wednesday, 4/3             -- Discharge Concerns: Need to establish a safety plan; Medication compliance and effectiveness             -- Discharge Goals: Return home with outpatient referrals for mental health follow-up including medication management/psychotherapy   I certify that inpatient services furnished can reasonably be expected to improve the patient's condition.    Dr. Jacques Navy, MD PGY-1, Psychiatry Residency  3/30/20241:17 PM Patient ID: [name], male   DOB: 07/25/1960, 62 y.o.   MRN: [id number]

## 2022-08-31 NOTE — Progress Notes (Signed)
Charles Estes rates sleep as "Good". He denies SI/HI/AVH. 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 Pt c/o of feeling "slow, sluggish, and dizzy" this a.m.BP has been on the lower side after recheck X 2. Gatorade and fluids given, Pt has been compliant. Norvasc is held until recheck before lunch, MD is informed and is aware. Pt is pleasant on approach. Pt remains safe.

## 2022-08-31 NOTE — [facility] Group Notes (Signed)
Pt did not attend goals group. 

## 2022-08-31 NOTE — Progress Notes (Signed)
BP taken with Dinamap standing 82/67. Patient reported didn't feel right. Fluids pushed. BP retaken manually 110/88. Currently resting in bed. C/o indigestion, prn mylanta given. Provider notified. Will monitor

## 2022-08-31 NOTE — Progress Notes (Signed)
EKG completed and placed in chart and copy hand delivered to Dr. Renard Hamper.

## 2022-08-31 NOTE — [facility] Group Notes (Signed)
[place] Group Notes:  (Nursing)  Date:  08/31/2022  Time:  400 PM  Type of Therapy:  Nurse Education  Participation Level:  Did Not Attend  Waymond Cera 08/31/2022, 7:32 PM

## 2022-09-01 NOTE — Progress Notes (Signed)
    Patient denies SI and AVH this shift. Patients reports homicidal ideations toward people in the communities and states if he sees them again he would want to hurt them. Patient was compliant with medications and exhibited no behavioral dyscontrol on the milieu.    Assess patient for safety, offer medications as prescribed, engage patient in 1:1 staff talks,   continue to monitor as planned. Patient able to contract for safety.

## 2022-09-01 NOTE — Progress Notes (Signed)
Adult Psychoeducational Group Note  Date:  09/01/2022 Time:  9:14 PM  Group Topic/Focus:  Wrap-Up Group:   The focus of this group is to help patients review their daily goal of treatment and discuss progress on daily workbooks.  Participation Level:  Active  Participation Quality:  Appropriate  Affect:  Appropriate  Cognitive:  Appropriate  Insight: Improving  Engagement in Group:  Engaged  Modes of Intervention:  Education  Additional Comments:  Pt attended the evening wrap-up group. Tech introduced the staff for the evening, reminded group of the evening schedule and reminded them to ask for anything they need. PT introduced themself to the group and shared a coping skill they use or have learned.  Amie Critchley 09/01/2022, 9:14 PM

## 2022-09-01 NOTE — Plan of Care (Signed)
  Problem: Safety: Goal: Periods of time without injury will increase Outcome: Progressing   

## 2022-09-01 NOTE — [facility] Suicide Risk Assessment (Signed)
[place] INPATIENT:  Family/Significant Other Suicide Prevention Education  Suicide Prevention Education:  Contact Attempts: Johney Maine [phone] , (name of family member/significant other) has been identified by the patient as the family member/significant other with whom the patient will be residing, and identified as the person(s) who will aid the patient in the event of a mental health crisis.  With written consent from the patient, two attempts were made to provide suicide prevention education, prior to and/or following the patient's discharge.  We were unsuccessful in providing suicide prevention education.  A suicide education pamphlet was given to the patient to share with family/significant other.  Date and time of first attempt:  09/01/2022 / 10:57am by Selmer Dominion, LCSW  Date and time of second attempt:  CSW to attempt  Maretta Los 09/01/2022, 10:58 AM

## 2022-09-01 NOTE — Progress Notes (Signed)
   09/01/22 0140  Psych Admission Type (Psych Patients Only)  Admission Status Involuntary  Psychosocial Assessment  Patient Complaints None  Eye Contact Fair  Facial Expression Animated  Affect Blunted  Speech Logical/coherent  Interaction Assertive  Motor Activity Other (Comment)  Appearance/Hygiene Unremarkable  Behavior Characteristics Cooperative  Mood Anxious;Pleasant  Thought Process  Coherency WDL  Content Blaming others  Delusions Persecutory  Perception WDL  Hallucination None reported or observed  Judgment Poor  Confusion None  Danger to Self  Current suicidal ideation? Denies  Agreement Not to Harm Self Yes  Description of Agreement verbal  Danger to Others  Danger to Others None reported or observed  Danger to Others Abnormal  Harmful Behavior to others Threats of violence towards other people observed or expressed    Alert/oriented. Makes needs/concerns known to staff. Pleasant cooperative with staff. Denies SI//A/V hallucinations. Med compliant. Patient states went to group.  Will  encourage compliance and progression towards goals. Verbally contracted for safety. Will continue to monitor.
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
## 2022-09-01 NOTE — Progress Notes (Signed)
Patient received alert and oriented. Oriented to staff  and milieu. Denies SI/HI/AVH, anxiety and depression.   Denies pain. Encouraged to drink fluids and participate in group. Patient encouraged to come to staff with needs and problems.    09/01/22 2125  Psych Admission Type (Psych Patients Only)  Admission Status Involuntary  Psychosocial Assessment  Patient Complaints None  Eye Contact Fair  Facial Expression Animated  Affect Blunted  Speech Logical/coherent  Interaction Assertive  Motor Activity Slow  Appearance/Hygiene Unremarkable  Behavior Characteristics Cooperative  Mood Anxious;Pleasant  Thought Process  Coherency WDL  Content Blaming others  Delusions Persecutory  Perception WDL  Hallucination None reported or observed  Judgment Poor  Confusion None  Danger to Self  Current suicidal ideation? Denies  Agreement Not to Harm Self Yes  Description of Agreement verbal  Danger to Others  Danger to Others None reported or observed  Danger to Others Abnormal  Harmful Behavior to others Threats of violence towards other people observed or expressed

## 2022-09-01 NOTE — Group Note (Signed)
Date:  09/01/2022 Time:  5:12 PM  Group Topic/Focus:  Spirituality:   The focus of this group is to discuss how one's spirituality can aide in recovery.    Participation Level:  Active  Participation Quality:  Appropriate  Affect:  Appropriate  Cognitive:  Appropriate  Insight: Appropriate  Engagement in Group:  Engaged  Modes of Intervention:  Exploration  Additional Comments:     [name] 09/01/2022, 5:12 PM

## 2022-09-02 ENCOUNTER — Encounter (HOSPITAL_COMMUNITY): Payer: Self-pay

## 2022-09-02 MED ORDER — GABAPENTIN 300 MG PO CAPS
300.0000 mg | ORAL_CAPSULE | Freq: Every day | ORAL | Status: DC
  Administered 2022-09-02 – 2022-09-03 (×2): 300 mg via ORAL
  Filled 2022-09-02 (×3): qty 1

## 2022-09-02 NOTE — Group Note (Signed)
Recreation Therapy Group Note   Group Topic:Health and Wellness  Group Date: 09/02/2022 Start Time: 0930 End Time: 1000 Facilitators: Lyman Balingit-McCall, LRT,CTRS Location: [address] Dayroom   Goal Area(s) Addresses:  Patient will verbalize benefit of exercise during group session. Patient will identify an exercise that can be completed post d/c. Patient will acknowledge benefits of exercise when used as a coping mechanism.   Group Description:  Exercise.  LRT introduced the activity of exercise patients.  LRT explained to patients they would be leading the activity by choosing the exercises that were completed by the group.  LRT and group completed three rounds of exercises.  Patients were instructed to take breaks if needed and listen to their bodies so as to not do any exercises that are outside of their ability.   Affect/Mood: N/A   Participation Level: Did not attend    Clinical Observations/Individualized Feedback:     Plan: Continue to engage patient in RT group sessions 2-3x/week.   Dacota Ruben-McCall, LRT,CTRS 09/02/2022 1:19 PM
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
## 2022-09-02 NOTE — Group Note (Signed)
Date:  09/02/2022 Time:  2:28 PM  Group Topic/Focus:  Orientation:   The focus of this group is to educate the patient on the purpose and policies of crisis stabilization and provide a format to answer questions about their admission.  The group details unit policies and expectations of patients while admitted.    Participation Level:  Active  Participation Quality:  Appropriate  Affect:  Angry  Cognitive:  Appropriate  Insight: Appropriate  Engagement in Group:  Engaged  Modes of Intervention:  Discussion  Additional Comments:     [name] 09/02/2022, 2:28 PM

## 2022-09-02 NOTE — BH IP Treatment Plan (Signed)
Interdisciplinary Treatment and Diagnostic Plan Update  09/02/2022 Time of Session: 8:30am, update Charles Estes MRN: [id number]  Principal Diagnosis: Bipolar disorder, curr episode depressed, severe, w/psychotic features  Secondary Diagnoses: Principal Problem:   Bipolar disorder, curr episode depressed, severe, w/psychotic features Active Problems:   Cocaine use disorder   Generalized anxiety disorder with panic attacks   Cannabis use disorder   Current Medications:  Current Facility-Administered Medications  Medication Dose Route Frequency Provider Last Rate Last Admin   acetaminophen (TYLENOL) tablet 650 mg  650 mg Oral Q6H PRN Revonda Humphrey, NP   650 mg at 08/30/22 1030   alum & mag hydroxide-simeth (MAALOX/MYLANTA) 200-200-20 MG/5ML suspension 30 mL  30 mL Oral Q4H PRN Revonda Humphrey, NP   30 mL at 08/31/22 0648   amLODipine (NORVASC) tablet 10 mg  10 mg Oral Daily Revonda Humphrey, NP   10 mg at 09/02/22 0813   ARIPiprazole (ABILIFY) tablet 10 mg  10 mg Oral Daily Ranae Palms, MD   10 mg at 09/02/22 0813   aspirin EC tablet 81 mg  81 mg Oral Daily Revonda Humphrey, NP   81 mg at 09/02/22 0813   cholecalciferol (VITAMIN D3) 25 MCG (1000 UNIT) tablet 5,000 Units  5,000 Units Oral Daily Massengill, Ovid Curd, MD   5,000 Units at 09/02/22 0813   cloNIDine (CATAPRES) tablet 0.1 mg  0.1 mg Oral Q4H PRN Massengill, Ovid Curd, MD   0.1 mg at 08/28/22 0840   diphenhydrAMINE (BENADRYL) capsule 50 mg  50 mg Oral TID PRN Revonda Humphrey, NP       Or   diphenhydrAMINE (BENADRYL) injection 50 mg  50 mg Intramuscular TID PRN Revonda Humphrey, NP       divalproex (DEPAKOTE) DR tablet 500 mg  500 mg Oral Q12H Ranae Palms, MD   500 mg at 09/02/22 0813   gabapentin (NEURONTIN) capsule 300 mg  300 mg Oral QHS Rolanda Lundborg, MD       haloperidol (HALDOL) tablet 5 mg  5 mg Oral TID PRN Revonda Humphrey, NP       Or   haloperidol lactate (HALDOL) injection 5 mg  5 mg  Intramuscular TID PRN Revonda Humphrey, NP       LORazepam (ATIVAN) tablet 2 mg  2 mg Oral TID PRN Revonda Humphrey, NP       Or   LORazepam (ATIVAN) injection 2 mg  2 mg Intramuscular TID PRN Revonda Humphrey, NP       magnesium hydroxide (MILK OF MAGNESIA) suspension 30 mL  30 mL Oral Daily PRN Revonda Humphrey, NP       pantoprazole (PROTONIX) EC tablet 40 mg  40 mg Oral Daily Thomes Lolling H, NP   40 mg at 09/02/22 0813   traZODone (DESYREL) tablet 100 mg  100 mg Oral QHS PRN Christene Slates, MD   100 mg at 09/01/22 2125   PTA Medications: Medications Prior to Admission  Medication Sig Dispense Refill Last Dose   amLODipine (NORVASC) 10 MG tablet Take 1 tablet (10 mg total) by mouth daily. 30 tablet 0    aspirin EC 81 MG tablet Take 81 mg by mouth daily.      cholecalciferol (VITAMIN D3) 25 MCG (1000 UNIT) tablet Take 5 tablets (5,000 Units total) by mouth daily with breakfast.      gabapentin (NEURONTIN) 100 MG capsule Take 2 capsules (200 mg total) by mouth 3 (three) times daily.  OLANZapine zydis (ZYPREXA) 5 MG disintegrating tablet Take 1 tablet (5 mg total) by mouth 2 (two) times daily.      pantoprazole (PROTONIX) 40 MG tablet Take 1 tablet (40 mg total) by mouth daily. 30 tablet 0     Patient Stressors: Financial difficulties   Marital or family conflict   Medication change or noncompliance   Substance abuse    Patient Strengths: Supportive family/friends   Treatment Modalities: Medication Management, Group therapy, Case management,  1 to 1 session with clinician, Psychoeducation, Recreational therapy.   Physician Treatment Plan for Primary Diagnosis: Bipolar disorder, curr episode depressed, severe, w/psychotic features Long Term Goal(s): Improvement in symptoms so as ready for discharge   Short Term Goals: Ability to identify changes in lifestyle to reduce recurrence of condition will improve Ability to verbalize feelings will  improve  Medication Management: Evaluate patient's response, side effects, and tolerance of medication regimen.  Therapeutic Interventions: 1 to 1 sessions, Unit Group sessions and Medication administration.  Evaluation of Outcomes: Progressing  Physician Treatment Plan for Secondary Diagnosis: Principal Problem:   Bipolar disorder, curr episode depressed, severe, w/psychotic features Active Problems:   Cocaine use disorder   Generalized anxiety disorder with panic attacks   Cannabis use disorder  Long Term Goal(s): Improvement in symptoms so as ready for discharge   Short Term Goals: Ability to identify changes in lifestyle to reduce recurrence of condition will improve Ability to verbalize feelings will improve     Medication Management: Evaluate patient's response, side effects, and tolerance of medication regimen.  Therapeutic Interventions: 1 to 1 sessions, Unit Group sessions and Medication administration.  Evaluation of Outcomes: Progressing   RN Treatment Plan for Primary Diagnosis: Bipolar disorder, curr episode depressed, severe, w/psychotic features Long Term Goal(s): Knowledge of disease and therapeutic regimen to maintain health will improve  Short Term Goals: Ability to remain free from injury will improve, Ability to verbalize frustration and anger appropriately will improve, Ability to demonstrate self-control, Ability to participate in decision making will improve, Ability to verbalize feelings will improve, Ability to disclose and discuss suicidal ideas, Ability to identify and develop effective coping behaviors will improve, and Compliance with prescribed medications will improve  Medication Management: RN will administer medications as ordered by provider, will assess and evaluate patient's response and provide education to patient for prescribed medication. RN will report any adverse and/or side effects to prescribing provider.  Therapeutic Interventions: 1 on 1  counseling sessions, Psychoeducation, Medication administration, Evaluate responses to treatment, Monitor vital signs and CBGs as ordered, Perform/monitor CIWA, COWS, AIMS and Fall Risk screenings as ordered, Perform wound care treatments as ordered.  Evaluation of Outcomes: Progressing   LCSW Treatment Plan for Primary Diagnosis: Bipolar disorder, curr episode depressed, severe, w/psychotic features Long Term Goal(s): Safe transition to appropriate next level of care at discharge, Engage patient in therapeutic group addressing interpersonal concerns.  Short Term Goals: Engage patient in aftercare planning with referrals and resources, Increase social support, Increase ability to appropriately verbalize feelings, Increase emotional regulation, Facilitate acceptance of mental health diagnosis and concerns, Facilitate patient progression through stages of change regarding substance use diagnoses and concerns, Identify triggers associated with mental health/substance abuse issues, and Increase skills for wellness and recovery  Therapeutic Interventions: Assess for all discharge needs, 1 to 1 time with Social worker, Explore available resources and support systems, Assess for adequacy in community support network, Educate family and significant other(s) on suicide prevention, Complete Psychosocial Assessment, Interpersonal group therapy.  Evaluation of  Outcomes: Progressing   Progress in Treatment: Attending groups: Yes. Participating in groups: Yes. Taking medication as prescribed: Yes. Toleration medication: Yes. Family/Significant other contact made: No, will contact:  Johney Maine [phone] Patient understands diagnosis: Yes. Discussing patient identified problems/goals with staff: Yes. Medical problems stabilized or resolved: Yes. Denies suicidal/homicidal ideation: Yes. Issues/concerns per patient self-inventory: No.   New problem(s) identified: No, Describe:  none reported   New  Short Term/Long Term Goal(s):  medication stabilization, elimination of SI thoughts, development of comprehensive mental wellness plan.    Patient Goals: Patient did not attend  Discharge Plan or Barriers: Pt provided med management and therapy appointments.  Reason for Continuation of Hospitalization: Anxiety Depression Homicidal ideation Medication stabilization Suicidal ideation  Estimated Length of Stay: 2-4 day  Last 3 [place] Suicide Severity Risk Score: [place] Admission (Current) from 08/26/2022 in [facility] Most recent reading at 08/26/2022  1:15 PM ED from 08/23/2022 in [facility] Most recent reading at 08/24/2022  6:38 AM ED from 08/23/2022 in [facility] Emergency Department [facility] Most recent reading at 08/23/2022  2:27 PM  C-SSRS RISK CATEGORY Moderate Risk High Risk High Risk       Last PHQ 2/9 Scores:    08/24/2022    6:38 AM 08/21/2022    9:21 AM  Depression screen PHQ 2/9  Decreased Interest 1 0  Down, Depressed, Hopeless 1 3  PHQ - 2 Score 2 3  Altered sleeping 1 3  Tired, decreased energy 1 3  Change in appetite 1 1  Feeling bad or failure about yourself  1 3  Trouble concentrating 1 2  Moving slowly or fidgety/restless 1 3  Suicidal thoughts 1 2  PHQ-9 Score 9 20  Difficult doing work/chores Somewhat difficult     Scribe for Treatment Team: Zachery Conch, LCSW 09/02/2022 4:41 PM

## 2022-09-02 NOTE — Group Note (Signed)
Date:  09/02/2022 Time:  5:17 PM  Group Topic/Focus:  Spirituality:   The focus of this group is to discuss how one's spirituality can aide in recovery.    Participation Level:  Active  Participation Quality:  Appropriate  Affect:  Appropriate  Cognitive:  Appropriate  Insight: Appropriate  Engagement in Group:  Engaged  Modes of Intervention:  Exploration  Additional Comments:     [name] 09/02/2022, 5:17 PM

## 2022-09-02 NOTE — [facility] Group Notes (Signed)
Patient attended the AA group. 

## 2022-09-02 NOTE — BHH Counselor (Signed)
BHH/BMU LCSW Progress Note   09/02/2022    2:43 PM  Charles Estes      Type of Note: Discharge Planning  CSW spoke with patient to see about his [facility] choice. Patient said that he was going to go to the [facility] but had interest in a local shelter. Patient  began to say that he wanted to save up his next two checks so he could get his own apartment. CSW provided patient with a list of shelters. Furthermore , patient stated that he needed to go back to his home town because he had some appointments and upcoming court dates there and will return back afterwards. Patient, discharge plan is to his truck. Per patient, " I am going to fill my car up with gas and hit the road. I will stay out of trouble , I am in a better mind state". CSW will continue to assist.    Signed:   Silas Flood, MSW, [facility] 09/02/2022 2:43 PM

## 2022-09-02 NOTE — BHH Group Notes (Signed)
PsychoEducational Group Note. Patients were given poem by Cristopher Peru, titled '' There's a hole in my sidewalk'' as it describes repeating negative behavioral patterns. Patients were then asked to reflect on negative patterns in their own life they would like to change, using reflection and positive reframing.  Pt attended but thought content shared was very concrete.

## 2022-09-02 NOTE — Plan of Care (Signed)
  Problem: Safety: Goal: Periods of time without injury will increase Outcome: Progressing   

## 2022-09-02 NOTE — Group Note (Signed)
Occupational Therapy Group Note  Group Topic:Coping Skills  Group Date: 09/02/2022 Start Time: 1430 End Time: 1505 Facilitators: Brantley Stage, OT   Group Description: Group encouraged increased engagement and participation through discussion and activity focused on "Coping Ahead." Patients were split up into teams and selected a card from a stack of positive coping strategies. Patients were instructed to act out/charade the coping skill for other peers to guess and receive points for their team. Discussion followed with a focus on identifying additional positive coping strategies and patients shared how they were going to cope ahead over the weekend while continuing hospitalization stay.  Therapeutic Goal(s): Identify positive vs negative coping strategies. Identify coping skills to be used during hospitalization vs coping skills outside of hospital/at home Increase participation in therapeutic group environment and promote engagement in treatment   Participation Level: Engaged   Participation Quality: Independent   Behavior: Appropriate   Speech/Thought Process: Relevant   Affect/Mood: Appropriate   Insight: Fair   Judgement: Fair   Individualization: pt was engaged in their participation of group discussion/activity. New skills identified  Modes of Intervention: Education  Patient Response to Interventions:  Attentive   Plan: Continue to engage patient in OT groups 2 - 3x/week.  09/02/2022  Brantley Stage, OT  Cornell Barman, OT

## 2022-09-02 NOTE — Group Note (Signed)
Date:  09/02/2022 Time:  2:31 PM  Group Topic/Focus:  Wellness Toolbox:   The focus of this group is to discuss various aspects of wellness, balancing those aspects and exploring ways to increase the ability to experience wellness.  Patients will create a wellness toolbox for use upon discharge.    Participation Level:  Active  Participation Quality:  Appropriate  Affect:  Appropriate  Cognitive:  Appropriate  Insight: Appropriate  Engagement in Group:  Engaged  Modes of Intervention:  Exploration  Additional Comments:     [name] 09/02/2022, 2:31 PM

## 2022-09-02 NOTE — Progress Notes (Signed)
   09/02/22 0800  Psych Admission Type (Psych Patients Only)  Admission Status Involuntary  Psychosocial Assessment  Patient Complaints None  Eye Contact Fair  Facial Expression Animated  Affect Blunted  Speech Logical/coherent  Interaction Assertive  Motor Activity Slow  Appearance/Hygiene Unremarkable  Behavior Characteristics Cooperative  Mood Anxious;Pleasant  Thought Process  Coherency WDL  Content Blaming others  Delusions Persecutory  Perception WDL  Hallucination None reported or observed  Judgment Poor  Confusion None  Danger to Self  Agreement Not to Harm Self Yes  Description of Agreement Verbal

## 2022-09-02 NOTE — Group Note (Unsigned)
Date:  09/02/2022 Time:  1:37 PM  Group Topic/Focus:  Wellness Toolbox:   The focus of this group is to discuss various aspects of wellness, balancing those aspects and exploring ways to increase the ability to experience wellness.  Patients will create a wellness toolbox for use upon discharge.     Participation Level:  {BHH PARTICIPATION HD:996081  Participation Quality:  {BHH PARTICIPATION QUALITY:22265}  Affect:  {BHH AFFECT:22266}  Cognitive:  {BHH COGNITIVE:22267}  Insight: {BHH Insight2:20797}  Engagement in Group:  {BHH ENGAGEMENT IN JY:3131603  Modes of Intervention:  {BHH MODES OF INTERVENTION:22269}  Additional Comments:  ***  Skip Marez 09/02/2022, 1:37 PM

## 2022-09-02 NOTE — Progress Notes (Signed)
   09/02/22 2200  Psych Admission Type (Psych Patients Only)  Admission Status Involuntary  Psychosocial Assessment  Patient Complaints None  Eye Contact Fair  Facial Expression Animated  Affect Anxious  Speech Logical/coherent  Interaction Assertive  Motor Activity Slow  Appearance/Hygiene Unremarkable  Behavior Characteristics Cooperative  Mood Anxious  Thought Process  Coherency WDL  Content Blaming others  Delusions None reported or observed  Perception WDL  Hallucination None reported or observed  Judgment Poor  Confusion None  Danger to Self  Current suicidal ideation? Denies  Agreement Not to Harm Self Yes  Description of Agreement verbal  Danger to Others Abnormal  Harmful Behavior to others No threats or harm toward other people   Alert/oriented. Makes needs/concerns known to staff. Pleasant cooperative with staff. Denies SI/HI/A/V hallucinations. Med compliant. PRN med given with good effect. Patient states went to group. Will encourage continued compliance and progression towards goals. Verbally contracted for safety. Will continue to monitor.

## 2022-09-02 NOTE — Progress Notes (Signed)
  Delight Ovens, Chaplain  Othelia Pulling, Chaplain Spiritual care group on grief and loss facilitated by [employer], [employer] and Chaplain Dakota Vanwart   Group Goal: Support / Education around grief and loss   Members engage in facilitated group support and psycho-social education.   Group Description:   Following introductions and group rules, group members engaged in facilitated group dialogue and support around topic of loss, with particular support around experiences of loss in their lives. Group Identified types of loss (relationships / self / things) and identified patterns, circumstances, and changes that precipitate losses. Reflected on thoughts / feelings around loss, normalized grief responses, and recognized variety in grief experience. Group encouraged individual reflection on safe space and on the coping skills that they are already utilizing.   Group drew on Adlerian / Rogerian and narrative framework   Patient Progress: Patient did not attend group.       Aleese Kamps [employer]

## 2022-09-02 NOTE — Progress Notes (Signed)
BHH/BMU LCSW Progress Note   09/02/2022    2:15 PM  Charles Estes      Type of Note: Medicare Notice   Patient informed of right to appeal discharge, provided phone number to [facility]. Patient expressed no interest in appealing discharge at this time. CSW will continue to monitor situation.     Signed:   Silas Flood, MSW, LCSWA 09/02/2022 2:15 PM

## 2022-09-03 ENCOUNTER — Ambulatory Visit: Payer: 59 | Admitting: Physician Assistant

## 2022-09-03 LAB — FREE VALPROIC ACID (DEPAKOTE): Valproic Acid, Free: 7.1 ug/mL (ref 6.0–22.0)

## 2022-09-03 NOTE — Plan of Care (Signed)
Nurse discussed anxiety, depression and coping skills with patient.  

## 2022-09-03 NOTE — Progress Notes (Signed)
D:  Patient's self inventory sheet, patient has fair sleep, sleep medication helpful.  Good appetite, low energy level, poor concentration.  Rated depression 3, hopeless and anxiety 4.  Denied withdrawals.  Denied SI.  Physical problems, lightheaded, pain, back pain.  Goal is getting out and stop having night mares.  Plans to attend groups and take meds.  Does have discharge plans. A:  Medications administered per MD orders.  Emotional support and encouragement given patient. R:  Denied SI and HI.  Denied A/V hallucinations.  Safety maintained with 15 minute checks.
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
## 2022-09-03 NOTE — Care Management Important Message (Signed)
Medicare IM given to Silas Flood, LCSW to give to the patient.

## 2022-09-03 NOTE — Group Note (Signed)
Recreation Therapy Group Note   Group Topic:Animal Assisted Therapy   Group Date: 09/03/2022 Start Time: 0945 End Time: 1030 Facilitators: Deshanae Lindo-McCall, LRT,CTRS Location: [address] Dayroom   Animal-Assisted Activity (AAA) Program Checklist/Progress Notes Patient Eligibility Criteria Checklist & Daily Group note for Rec Tx Intervention  AAA/T Program Assumption of Risk Form signed by Patient/ or Parent Legal Guardian Yes  Patient is free of allergies or severe asthma Yes  Patient reports no fear of animals Yes  Patient reports no history of cruelty to animals Yes  Patient understands his/her participation is voluntary Yes  Patient washes hands before animal contact Yes  Patient washes hands after animal contact Yes   Affect/Mood: Appropriate   Participation Level: Engaged   Participation Quality: Independent   Behavior: Appropriate   Speech/Thought Process: Focused    Clinical Observations/Individualized Feedback:  Patient attended session and interacted appropriately with therapy dog and peers. Patient asked appropriate questions about therapy dog and his training. Patient shared stories about their pets at home with group.   Plan: Continue to engage patient in RT group sessions 2-3x/week.   Charles Estes, LRT,CTRS 09/03/2022 12:56 PM

## 2022-09-03 NOTE — Progress Notes (Signed)
Adult Psychoeducational Group Note  Date:  09/03/2022 Time:  9:42 PM  Group Topic/Focus:  Wrap-Up Group:   The focus of this group is to help patients review their daily goal of treatment and discuss progress on daily workbooks.  Participation Level:  Active  Participation Quality:  Appropriate  Affect:  Appropriate  Cognitive:  Appropriate  Insight: Appropriate  Engagement in Group:  Engaged  Modes of Intervention:  Discussion and Support  Additional Comments:  Pt attended the evening group and responded to all discussion prompts from the [place]. Pt shared that today was a good day on the unit, the highlight of which was learning of his upcoming discharge. "I feel good about it because I've got a plan to stay right.") On the subject of staying well upon discharge, Charles Estes mentioned taking his medications as prescribed and also getting out of [facility] for a while. "It's all going to work out." Pt rated his day an 8 out of 10.  Charles Estes 09/03/2022, 9:42 PM

## 2022-09-04 LAB — VALPROIC ACID LEVEL: Valproic Acid Lvl: 60 ug/mL (ref 50.0–100.0)

## 2022-09-04 MED ORDER — GABAPENTIN 300 MG PO CAPS: 300.0000 mg | ORAL_CAPSULE | Freq: Every day | ORAL | 0 refills | Status: DC

## 2022-09-04 MED ORDER — TRAZODONE HCL 100 MG PO TABS: 100.0000 mg | ORAL_TABLET | Freq: Every evening | ORAL | 0 refills | Status: DC | PRN

## 2022-09-04 MED ORDER — DIVALPROEX SODIUM 500 MG PO DR TAB: 500.0000 mg | DELAYED_RELEASE_TABLET | Freq: Two times a day (BID) | ORAL | 0 refills | Status: DC

## 2022-09-04 MED ORDER — ARIPIPRAZOLE 10 MG PO TABS: 10.0000 mg | ORAL_TABLET | Freq: Every day | ORAL | 0 refills | Status: DC

## 2022-09-04 NOTE — [facility] Suicide Risk Assessment (Addendum)
[facility] Discharge Suicide Risk Assessment  Principal Problem: Bipolar disorder, curr episode depressed, severe, w/psychotic features Discharge Diagnoses: Principal Problem:   Bipolar disorder, curr episode depressed, severe, w/psychotic features Active Problems:   Cocaine use disorder   Generalized anxiety disorder with panic attacks   Cannabis use disorder   Reason for admission: Charles Estes is a 62 yo male with a self-reported PPHx of bipolar type III and stimulant use disorder [facility] in the setting of recent drug use and reporting active suicidal ideation and active homicidal ideation with the intent of killing a group of acquaintances he believes left him for dead in his car, then killing himself.   PTA Medications:  Zyprexa 5mg  BID   Hospital Course:   During the patient's hospitalization, patient had extensive initial psychiatric evaluation, and follow-up psychiatric evaluations every day.  Psychiatric diagnoses provided upon initial assessment:  Bipolar disorder, current episode depressed, with psychotic features Cocaine use disorder Cannabis use disorder GAD with panic attacks  Patient's psychiatric medications were adjusted on admission:  Start Depakote 250 mg every 12 hours as mood stabilizer start  Start Abilify 5 mg daily to augment effects of Depakote Continue trazodone 50 mg nightly as needed for insomnia  During the hospitalization, other adjustments were made to the patient's psychiatric medication regimen:  - Discontinued Zyprexa 08/28/2022  - Increase Depakote 250 every 12 hours to 500 mg every 12 hours for mood stabilization  -Increase Abilify 5 mg to 10 mg daily as augmentative therapy - Increase home Gabapentin 300 mg QHS for sleep   During the hospitalization,  BMI: 32.95 kg/m TSH: 1.599, WNL Lipid Panel: LDL 142, all other values WNL HbgA1c: 6.3% QTc: 486  Depakote level 60 at time of discharge   Patient's care was discussed  during the interdisciplinary team meeting every day during the hospitalization.  The patient denied having side effects to prescribed psychiatric medication.  Gradually, patient started adjusting to milieu. The patient was evaluated each day by a clinical provider to ascertain response to treatment. Improvement was noted by the patient's report of decreasing symptoms, improved sleep and appetite, affect, medication tolerance, behavior, and participation in unit programming.  Patient was asked each day to complete a self inventory noting mood, mental status, pain, new symptoms, anxiety and concerns.    Symptoms were reported as significantly decreased or resolved completely by discharge.   On day of discharge, the patient reports that their mood is stable. The patient denied having suicidal thoughts for more than 48 hours prior to discharge.  Patient denies having homicidal thoughts.  Patient denies having auditory hallucinations.  Patient denies any visual hallucinations or other symptoms of psychosis. The patient was motivated to continue taking medication with a goal of continued improvement in mental health.   The patient reports their target psychiatric symptoms of paranoia, homicidal ideation, active suicidal ideation responded well to the psychiatric medications, and the patient reports overall benefit other psychiatric hospitalization. Supportive psychotherapy was provided to the patient. The patient also participated in regular group therapy while hospitalized. Coping skills, problem solving as well as relaxation therapies were also part of the unit programming.  Labs were reviewed with the patient, and abnormal results were discussed with the patient.  The patient is able to verbalize their individual safety plan to this provider.  Behavioral Events: None  Restraints: None  Groups: Attended and participated   Medications Changes: As above   Sleep  Sleep:No data  recorded  Musculoskeletal: Strength &  Muscle Tone: within normal limits Gait & Station: normal Patient leans: N/A  Psychiatric Specialty Exam  General Appearance: appears at stated age, fairly dressed and groomed,   Behavior: cooperative  Psychomotor Activity:No psychomotor agitation or retardation noted                                        Eye Contact: fair Speech: normal   Mood: euthymic Affect: congruent  Thought Process: linear, goal directed Descriptions of Associations: intact Thought Content: Hallucinations: Denies AH, VH  Delusions: Denies Paranoia  Suicidal Thoughts: Denies SI, intention, plan  Homicidal Thoughts: Denies HI, intention, plan   Alertness/Orientation: alert and answering questions appropriately   Insight: fair Judgment: fair  Memory: intact  Executive Functions  Concentration: intact Attention Span: fair Recall: intact Fund of Knowledge: fair  Assets  Assets: Armed forces logistics/support/administrative officer; Desire for Improvement; Resilience   Physical Exam: Constitutional:      Appearance: the patient is not toxic-appearing.  Pulmonary:     Effort: Pulmonary effort is normal.  Neurological:     General: No focal deficit present.     Mental Status: the patient is alert and oriented to person, place, and time.   Review of Systems  Respiratory:  Negative for shortness of breath.   Cardiovascular:  Negative for chest pain.  Gastrointestinal:  Negative for abdominal pain, constipation, diarrhea, nausea and vomiting.  Neurological:  Negative for headaches.   Blood pressure (!) 141/101, pulse 87, temperature 98.1 F (36.7 C), temperature source Oral, resp. rate 14, height 5\' 5"  (1.651 m), weight 89.8 kg, SpO2 98 %. Body mass index is 32.95 kg/m.  Mental Status Per Nursing Assessment::   On Admission:  Suicidal ideation indicated by patient, Thoughts of violence towards others  Demographic Factors:  Male and Low socioeconomic status  Loss  Factors: Financial problems/change in socioeconomic status  Historical Factors: Impulsivity  Risk Reduction Factors:   Positive social support  Continued Clinical Symptoms:  Bipolar Disorder:   Depressive phase  Cognitive Features That Contribute To Risk:  Thought constriction (tunnel vision)    Suicide Risk:  Mild:  There are no identifiable plans, no associated intent, mild dysphoria and related symptoms, good self-control (both objective and subjective assessment), few other risk factors, and identifiable protective factors, including available and accessible social support.   [place] Follow up on 09/07/2022.   Why: You have an appointment for therapy services on  Saturday, 09/07/22 at 11:00 am.  This appointment will be Virtual.  You must go online to fill our the required paperwork and submit a copy of your insurance card.  (You may call to switch the appointment to in person). Contact information: [address], [place], [place] [address] [phone]         [place]. Go on 09/26/2022.   Why: You have an appointment for medication management services on 09/26/22 at 2:00 pm.  This appointment will be held in person.  * YOU MUST ATTEND THIS APPT OR COULD BE DISMISSED FROM THE PRACTICE. Contact information: [place] [address] [address] [phone]                 Discharge recommendations:    Activity: as tolerated  Diet: heart healthy  # It is recommended to the patient to continue psychiatric medications as  prescribed, after discharge from the hospital.     # It is recommended to the patient to follow up with your outpatient psychiatric provider -instructions on appointment date, time, and address (location) are provided to you in discharge paperwork  # Follow-up with outpatient primary care doctor and other specialists -for management of chronic  medical disease, including: HTN, GERD, HLD, CAD   # Testing: Follow-up with outpatient provider for abnormal lab results:  LDL-142    # It was discussed with the patient, the impact of alcohol, drugs, tobacco have been there overall psychiatric and medical wellbeing, and total abstinence from substance use was recommended to the patient.   # Prescriptions provided or sent directly to preferred pharmacy at discharge. Patient agreeable to plan. Given opportunity to ask questions. Appears to feel comfortable with discharge.    # In the event of worsening symptoms, the patient is instructed to call the crisis hotline, 911 and or go to the nearest ED for appropriate evaluation and treatment of symptoms. To follow-up with primary care provider for other medical issues, concerns and or health care needs  Patient agrees with D/C instructions and plan.   Total Time Spent in Direct Patient Care:  I personally spent 60 minutes on the unit in direct patient care. The direct patient care time included face-to-face time with the patient, reviewing the patient's chart, communicating with other professionals, and coordinating care. Greater than 50% of this time was spent in counseling or coordinating care with the patient regarding goals of hospitalization, psycho-education, and discharge planning needs.   Rolanda Lundborg, MD, PGY-1 09/04/2022, 9:22 AM

## 2022-09-04 NOTE — [facility] Group Notes (Signed)
[place] [facility] Group Notes:  (Nursing/MHT/Case Management/Adjunct)  Date:  09/04/2022  Time:  10:12 AM  Type of Therapy:  Group Therapy  Participation Level:  Active  Participation Quality:  Appropriate  Affect:  Appropriate  Cognitive:  Appropriate  Insight:  Appropriate  Engagement in Group:  Engaged  Modes of Intervention:  Discussion  Summary of Progress/Problems:  Laasya Peyton Olene Craven 09/04/2022, 10:12 AM

## 2022-09-04 NOTE — Progress Notes (Signed)
   09/04/22 0902  Psych Admission Type (Psych Patients Only)  Admission Status Involuntary  Psychosocial Assessment  Patient Complaints None  Eye Contact Fair  Facial Expression Animated  Affect Anxious  Speech Logical/coherent  Interaction Assertive  Motor Activity Slow  Appearance/Hygiene Unremarkable  Behavior Characteristics Cooperative  Mood Anxious  Thought Process  Coherency WDL  Content WDL  Delusions None reported or observed  Perception WDL  Hallucination None reported or observed  Judgment Poor  Confusion None  Danger to Self  Current suicidal ideation? Denies  Agreement Not to Harm Self Yes  Description of Agreement verbal  Danger to Others  Danger to Others None reported or observed  Danger to Others Abnormal  Harmful Behavior to others No threats or harm toward other people

## 2022-09-04 NOTE — Progress Notes (Signed)
Patient discharged. Reviewed discharge instructions. Patient verbalized understanding. Patient received all personal belongings. Patient discharged to lobby awaiting safe transport for transportation.

## 2022-09-04 NOTE — Progress Notes (Signed)
  [facility] Adult Case Management Discharge Plan :  Will you be returning to the same living situation after discharge:  No. Patient will be returning back to his hometown  At discharge, do you have transportation home?: Yes,  Safe transport will take patient back to his truck  Do you have the ability to pay for your medications: Yes,  Patient has [employer]   Release of information consent forms completed and in the chart;  Patient's signature needed at discharge.  Patient to Follow up at:  [facility] Follow up on 09/07/2022.   Why: You have an appointment for therapy services on  Saturday, 09/07/22 at 11:00 am.  This appointment will be Virtual.  You must go online to fill our the required paperwork and submit a copy of your insurance card.  (You may call to switch the appointment to in person). Contact information: [place], [place], [place] [address] [phone]         [facility]. Go on 09/26/2022.   Why: You have an appointment for medication management services on 09/26/22 at 2:00 pm.  This appointment will be held in person.  * YOU MUST ATTEND THIS APPT OR COULD BE DISMISSED FROM THE PRACTICE. Contact information: [place] [address] [phone]                 Next level of care provider has access to [facility] and Suicide Prevention discussed: Yes,  CSW reached out twice to Anabel Hollerbach (daughter) [phone] Nancy Fetter 3/31 at 10:57am and Tuesday 04/02 @ 8:12 AM ) left VM ; Patient provided CSW yesterday with his ex-wife number Maryann Conners [phone] and safety planning was completed this AM.      Has patient been referred to the Quitline?: N/A patient states that he is not a smoker   Patient has been referred for addiction treatment: N/A. States that he only does cocaine once a month  Charlett Lango 09/04/2022, 9:34 AM

## 2022-09-04 NOTE — [facility] Suicide Risk Assessment (Signed)
[facility] INPATIENT:  Family/Significant Other Suicide Prevention Education  Suicide Prevention Education:  Education Completed; Maryann Conners ( ex-wife) [phone],  (name of family member/significant other) has been identified by the patient as the family member/significant other with whom the patient will be residing, and identified as the person(s) who will aid the patient in the event of a mental health crisis (suicidal ideations/suicide attempt).  With written consent from the patient, the family member/significant other has been provided the following suicide prevention education, prior to the and/or following the discharge of the patient.   CSW completed safety planning with ex-wife. Ex-wife stated that she did not have any questions or concerns about patient DC. Ex-wife stated that patient will return back to his home town due to appointments he have coming up. Ex-wife confirmed that patient did not own or have access to any guns or weapons.   The suicide prevention education provided includes the following: Suicide risk factors Suicide prevention and interventions National Suicide Hotline telephone number [facility] assessment telephone number [facility] Emergency Assistance [place] and/or Residential Mobile Crisis Unit telephone number  Request made of family/significant other to: Remove weapons (e.g., guns, rifles, knives), all items previously/currently identified as safety concern.   Remove drugs/medications (over-the-counter, prescriptions, illicit drugs), all items previously/currently identified as a safety concern.  The family member/significant other verbalizes understanding of the suicide prevention education information provided.  The family member/significant other agrees to remove the items of safety concern listed above.  Sherre Lain 09/04/2022, 9:28 AM
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
## 2022-09-11 NOTE — Progress Notes (Unsigned)
PROVIDER NOTE: Interpretation of information contained herein should be left to medically-trained personnel. Specific patient instructions are provided elsewhere under "Patient Instructions" section of medical record. This document was created in part using STT-dictation technology, any transcriptional errors that may result from this process are unintentional.  Patient: Charles Estes Type: Established DOB: Oct 17, 1960 MRN: [id number] PCP: Michell Heinrich, MD  Service: Procedure DOS: 09/12/2022 Setting: Ambulatory Location: Ambulatory outpatient facility Delivery: Face-to-face Provider: Oswaldo Done, MD Specialty: Interventional Pain Management Specialty designation: 09 Location: Outpatient facility Ref. Prov.: Delano Metz, MD       Interventional Therapy   Procedure: Lumbar epidural steroid injection (LESI) (interlaminar) #1    Laterality: Left   Level:  L1-2 Level.  Imaging: Fluoroscopic guidance         Anesthesia: Local anesthesia (1-2% Lidocaine) Anxiolysis:    ***                    Sedation:                         DOS: 09/12/2022  Performed by: Oswaldo Done, MD  Purpose: Diagnostic/Therapeutic Indications: Lumbar radicular pain of intraspinal etiology of more than 4 weeks that has failed to respond to conservative therapy and is severe enough to impact quality of life or function. No diagnosis found. NAS-11 Pain score:   Pre-procedure:  /10   Post-procedure:  /10      Position / Prep / Materials:  Position: Prone w/ head of the table raised (slight reverse trendelenburg) to facilitate breathing.  Prep solution: DuraPrep (Iodine Povacrylex [0.7% available iodine] and Isopropyl Alcohol, 74% w/w) Prep Area: Entire Posterior Lumbar Region from lower scapular tip down to mid buttocks area and from flank to flank. Materials:  Tray: Epidural tray Needle(s):  Type: Epidural needle (Tuohy) Gauge (G):  17 Length: Regular (3.5-in) Qty: 1   Pre-op H&P  Assessment:  Mr. Axelson is a 62 y.o. (year old), male patient, seen today for interventional treatment. He  has a past surgical history that includes Mandible fracture surgery (2014) and ORIF ulnar fracture (Right, 07/16/2017). Mr. Lorincz has a current medication list which includes the following prescription(s): amlodipine, aripiprazole, aspirin ec, cholecalciferol, divalproex, gabapentin, pantoprazole, trazodone, [DISCONTINUED] calcium-vitamin d, and [DISCONTINUED] promethazine. His primarily concern today is the No chief complaint on file.  Initial Vital Signs:  Pulse/HCG Rate:    Temp:   Resp:   BP:   SpO2:    BMI: Estimated body mass index is 32.95 kg/m as calculated from the following:   Height as of 08/26/22: 5\' 5"  (1.651 m).   Weight as of 08/26/22: 198 lb (89.8 kg).  Risk Assessment: Allergies: Reviewed. He is allergic to paxil [paroxetine].  Allergy Precautions: None required Coagulopathies: Reviewed. None identified.  Blood-thinner therapy: None at this time Active Infection(s): Reviewed. None identified. Mr. Degner is afebrile  Site Confirmation: Mr. Clemmens was asked to confirm the procedure and laterality before marking the site Procedure checklist: Completed Consent: Before the procedure and under the influence of no sedative(s), amnesic(s), or anxiolytics, the patient was informed of the treatment options, risks and possible complications. To fulfill our ethical and legal obligations, as recommended by the American Medical Association's Code of Ethics, I have informed the patient of my clinical impression; the nature and purpose of the treatment or procedure; the risks, benefits, and possible complications of the intervention; the alternatives, including doing nothing; the risk(s) and benefit(s) of the alternative treatment(s)  or procedure(s); and the risk(s) and benefit(s) of doing nothing. The patient was provided information about the general risks and possible complications  associated with the procedure. These may include, but are not limited to: failure to achieve desired goals, infection, bleeding, organ or nerve damage, allergic reactions, paralysis, and death. In addition, the patient was informed of those risks and complications associated to Spine-related procedures, such as failure to decrease pain; infection (i.e.: Meningitis, epidural or intraspinal abscess); bleeding (i.e.: epidural hematoma, subarachnoid hemorrhage, or any other type of intraspinal or peri-dural bleeding); organ or nerve damage (i.e.: Any type of peripheral nerve, nerve root, or spinal cord injury) with subsequent damage to sensory, motor, and/or autonomic systems, resulting in permanent pain, numbness, and/or weakness of one or several areas of the body; allergic reactions; (i.e.: anaphylactic reaction); and/or death. Furthermore, the patient was informed of those risks and complications associated with the medications. These include, but are not limited to: allergic reactions (i.e.: anaphylactic or anaphylactoid reaction(s)); adrenal axis suppression; blood sugar elevation that in diabetics may result in ketoacidosis or comma; water retention that in patients with history of congestive heart failure may result in shortness of breath, pulmonary edema, and decompensation with resultant heart failure; weight gain; swelling or edema; medication-induced neural toxicity; particulate matter embolism and blood vessel occlusion with resultant organ, and/or nervous system infarction; and/or aseptic necrosis of one or more joints. Finally, the patient was informed that Medicine is not an exact science; therefore, there is also the possibility of unforeseen or unpredictable risks and/or possible complications that may result in a catastrophic outcome. The patient indicated having understood very clearly. We have given the patient no guarantees and we have made no promises. Enough time was given to the patient to  ask questions, all of which were answered to the patient's satisfaction. Mr. Freimark has indicated that he wanted to continue with the procedure. Attestation: I, the ordering provider, attest that I have discussed with the patient the benefits, risks, side-effects, alternatives, likelihood of achieving goals, and potential problems during recovery for the procedure that I have provided informed consent. Date  Time: {CHL [facility]-PAIN TIME CHOICES:[id number]}   Pre-Procedure Preparation:  Monitoring: As per clinic protocol. Respiration, ETCO2, SpO2, BP, heart rate and rhythm monitor placed and checked for adequate function Safety Precautions: Patient was assessed for positional comfort and pressure points before starting the procedure. Time-out: I initiated and conducted the "Time-out" before starting the procedure, as per protocol. The patient was asked to participate by confirming the accuracy of the "Time Out" information. Verification of the correct person, site, and procedure were performed and confirmed by me, the nursing staff, and the patient. "Time-out" conducted as per Joint Commission's Universal Protocol (UP.01.01.01). Time:   Start Time:   hrs.  Description/Narrative of Procedure:          Target: Epidural space via interlaminar opening, initially targeting the lower laminar border of the superior vertebral body. Region: Lumbar Approach: Percutaneous paravertebral  Rationale (medical necessity): procedure needed and proper for the diagnosis and/or treatment of the patient's medical symptoms and needs. Procedural Technique Safety Precautions: Aspiration looking for blood return was conducted prior to all injections. At no point did we inject any substances, as a needle was being advanced. No attempts were made at seeking any paresthesias. Safe injection practices and needle disposal techniques used. Medications properly checked for expiration dates. SDV (single dose vial) medications  used. Description of the Procedure: Protocol guidelines were followed. The procedure needle was introduced through  the skin, ipsilateral to the reported pain, and advanced to the target area. Bone was contacted and the needle walked caudad, until the lamina was cleared. The epidural space was identified using "loss-of-resistance technique" with 2-3 ml of PF-NaCl (0.9% NSS), in a 5cc LOR glass syringe.  There were no vitals filed for this visit.  Start Time:   hrs. End Time:   hrs.  Imaging Guidance (Spinal):          Type of Imaging Technique: Fluoroscopy Guidance (Spinal) Indication(s): Assistance in needle guidance and placement for procedures requiring needle placement in or near specific anatomical locations not easily accessible without such assistance. Exposure Time: Please see nurses notes. Contrast: Before injecting any contrast, we confirmed that the patient did not have an allergy to iodine, shellfish, or radiological contrast. Once satisfactory needle placement was completed at the desired level, radiological contrast was injected. Contrast injected under live fluoroscopy. No contrast complications. See chart for type and volume of contrast used. Fluoroscopic Guidance: I was personally present during the use of fluoroscopy. "Tunnel Vision Technique" used to obtain the best possible view of the target area. Parallax error corrected before commencing the procedure. "Direction-depth-direction" technique used to introduce the needle under continuous pulsed fluoroscopy. Once target was reached, antero-posterior, oblique, and lateral fluoroscopic projection used confirm needle placement in all planes. Images permanently stored in EMR. Interpretation: I personally interpreted the imaging intraoperatively. Adequate needle placement confirmed in multiple planes. Appropriate spread of contrast into desired area was observed. No evidence of afferent or efferent intravascular uptake. No intrathecal or  subarachnoid spread observed. Permanent images saved into the patient's record.  Antibiotic Prophylaxis:   Anti-infectives (From admission, onward)    None      Indication(s): None identified  Post-operative Assessment:  Post-procedure Vital Signs:  Pulse/HCG Rate:    Temp:   Resp:   BP:   SpO2:    EBL: None  Complications: No immediate post-treatment complications observed by team, or reported by patient.  Note: The patient tolerated the entire procedure well. A repeat set of vitals were taken after the procedure and the patient was kept under observation following institutional policy, for this type of procedure. Post-procedural neurological assessment was performed, showing return to baseline, prior to discharge. The patient was provided with post-procedure discharge instructions, including a section on how to identify potential problems. Should any problems arise concerning this procedure, the patient was given instructions to immediately contact us, at any time, without hesitation. In any case, we plan to contact the patient by telephone for a follow-up status report regarding this interventional procedure.  Comments:  No additional relevant information.  Plan of Care (POC)  Orders:  No orders of the defined types were placed in this encounter.  Chronic Opioid Analgesic:  None MME/day: 0 mg/day   Medications ordered for procedure: No orders of the defined types were placed in this encounter.  Medications administered: Muaad Desanctis had no medications administered during this visit.  See the medical record for exact dosing, route, and time of administration.  Follow-up plan:   No follow-ups on file.       Interventional Therapies  Risk Factors  Considerations:  Uncontrolled hypertension     Planned  Pending:   Diagnostic left L1-2 LESI #1    Under consideration:   Diagnostic left L4-5 LESI #1    Completed:   None at this time   Completed by other  providers:   None at this time   Therapeutic  Palliative (PRN) options:   None established        Recent Visits Date Type Provider Dept  08/21/22 Office Visit Delano Metz, MD [facility]  07/24/22 Office Visit Delano Metz, MD [facility]  Showing recent visits within past 90 days and meeting all other requirements Future Appointments Date Type Provider Dept  09/12/22 Appointment Delano Metz, MD [facility]  Showing future appointments within next 90 days and meeting all other requirements  Disposition: Discharge home  Discharge (Date  Time): 09/12/2022;   hrs.   Primary Care Physician: Michell Heinrich, MD Location: [facility] Note by: Oswaldo Done, MD (TTS technology used. I apologize for any typographical errors that were not detected and corrected.) Date: 09/12/2022; Time: 6:55 AM  Disclaimer:  Medicine is not an Visual merchandiser. The only guarantee in medicine is that nothing is guaranteed. It is important to note that the decision to proceed with this intervention was based on the information collected from the patient. The Data and conclusions were drawn from the patient's questionnaire, the interview, and the physical examination. Because the information was provided in large part by the patient, it cannot be guaranteed that it has not been purposely or unconsciously manipulated. Every effort has been made to obtain as much relevant data as possible for this evaluation. It is important to note that the conclusions that lead to this procedure are derived in large part from the available data. Always take into account that the treatment will also be dependent on availability of resources and existing treatment guidelines, considered by other Pain Management Practitioners as being common knowledge and practice, at the time of the intervention. For Medico-Legal purposes, it is also important to point  out that variation in procedural techniques and pharmacological choices are the acceptable norm. The indications, contraindications, technique, and results of the above procedure should only be interpreted and judged by a Board-Certified Interventional Pain Specialist with extensive familiarity and expertise in the same exact procedure and technique.

## 2022-09-12 ENCOUNTER — Encounter: Payer: Self-pay | Admitting: Pain Medicine

## 2022-09-12 ENCOUNTER — Ambulatory Visit: Payer: Worker's Compensation | Attending: Pain Medicine | Admitting: Pain Medicine

## 2022-09-12 VITALS — BP 125/101 | HR 90 | Temp 97.3°F | Resp 16 | Ht 66.0 in | Wt 200.0 lb

## 2022-09-12 DIAGNOSIS — I1A0 Resistant hypertension: Secondary | ICD-10-CM | POA: Insufficient documentation

## 2022-09-12 DIAGNOSIS — I10 Essential (primary) hypertension: Secondary | ICD-10-CM

## 2022-09-12 DIAGNOSIS — G8929 Other chronic pain: Secondary | ICD-10-CM

## 2022-09-12 DIAGNOSIS — M5442 Lumbago with sciatica, left side: Secondary | ICD-10-CM

## 2022-09-12 NOTE — Patient Instructions (Signed)

## 2022-09-19 ENCOUNTER — Ambulatory Visit: Payer: Worker's Compensation | Admitting: Pain Medicine

## 2022-10-01 ENCOUNTER — Encounter: Payer: Self-pay | Admitting: Pain Medicine

## 2022-10-01 ENCOUNTER — Ambulatory Visit (HOSPITAL_BASED_OUTPATIENT_CLINIC_OR_DEPARTMENT_OTHER): Payer: Worker's Compensation | Admitting: Pain Medicine

## 2022-10-01 DIAGNOSIS — G8929 Other chronic pain: Secondary | ICD-10-CM

## 2022-10-01 NOTE — Patient Instructions (Signed)

## 2022-10-01 NOTE — Progress Notes (Signed)
3rd attempt at LESI cancelled by patient on day of appointment. Says he has no transportation. I will not be rescheduling him again.

## 2022-10-08 ENCOUNTER — Ambulatory Visit: Payer: 59 | Admitting: Pain Medicine

## 2022-10-18 ENCOUNTER — Emergency Department (HOSPITAL_COMMUNITY): Payer: 59

## 2022-10-18 ENCOUNTER — Other Ambulatory Visit: Payer: Self-pay

## 2022-10-18 ENCOUNTER — Encounter (HOSPITAL_COMMUNITY): Payer: Self-pay

## 2022-10-18 ENCOUNTER — Inpatient Hospital Stay (HOSPITAL_COMMUNITY)
Admission: EM | Admit: 2022-10-18 | Discharge: 2022-10-19 | DRG: 176 | Disposition: A | Discharge status: Other Acute Inpatient Hospital | Payer: 59 | Attending: Family Medicine | Admitting: Family Medicine

## 2022-10-18 DIAGNOSIS — Z79899 Other long term (current) drug therapy: Secondary | ICD-10-CM | POA: Insufficient documentation

## 2022-10-18 DIAGNOSIS — F1410 Cocaine abuse, uncomplicated: Secondary | ICD-10-CM | POA: Diagnosis present

## 2022-10-18 DIAGNOSIS — K219 Gastro-esophageal reflux disease without esophagitis: Secondary | ICD-10-CM | POA: Diagnosis present

## 2022-10-18 DIAGNOSIS — I2699 Other pulmonary embolism without acute cor pulmonale: Principal | ICD-10-CM | POA: Diagnosis present

## 2022-10-18 DIAGNOSIS — F410 Panic disorder [episodic paroxysmal anxiety] without agoraphobia: Secondary | ICD-10-CM | POA: Diagnosis present

## 2022-10-18 DIAGNOSIS — R45850 Homicidal ideations: Secondary | ICD-10-CM | POA: Diagnosis present

## 2022-10-18 DIAGNOSIS — Z8249 Family history of ischemic heart disease and other diseases of the circulatory system: Secondary | ICD-10-CM | POA: Diagnosis not present

## 2022-10-18 DIAGNOSIS — Z5900 Homelessness unspecified: Secondary | ICD-10-CM

## 2022-10-18 DIAGNOSIS — Z86711 Personal history of pulmonary embolism: Secondary | ICD-10-CM

## 2022-10-18 DIAGNOSIS — M2550 Pain in unspecified joint: Secondary | ICD-10-CM | POA: Diagnosis not present

## 2022-10-18 DIAGNOSIS — G8929 Other chronic pain: Secondary | ICD-10-CM | POA: Diagnosis present

## 2022-10-18 DIAGNOSIS — F315 Bipolar disorder, current episode depressed, severe, with psychotic features: Secondary | ICD-10-CM | POA: Diagnosis present

## 2022-10-18 DIAGNOSIS — I10 Essential (primary) hypertension: Secondary | ICD-10-CM | POA: Diagnosis present

## 2022-10-18 DIAGNOSIS — F3164 Bipolar disorder, current episode mixed, severe, with psychotic features: Secondary | ICD-10-CM

## 2022-10-18 DIAGNOSIS — M545 Low back pain, unspecified: Secondary | ICD-10-CM | POA: Diagnosis present

## 2022-10-18 DIAGNOSIS — F411 Generalized anxiety disorder: Secondary | ICD-10-CM | POA: Diagnosis present

## 2022-10-18 DIAGNOSIS — Z888 Allergy status to other drugs, medicaments and biological substances status: Secondary | ICD-10-CM | POA: Diagnosis not present

## 2022-10-18 DIAGNOSIS — R45851 Suicidal ideations: Secondary | ICD-10-CM | POA: Diagnosis present

## 2022-10-18 DIAGNOSIS — Z7982 Long term (current) use of aspirin: Secondary | ICD-10-CM | POA: Insufficient documentation

## 2022-10-18 DIAGNOSIS — I2782 Chronic pulmonary embolism: Secondary | ICD-10-CM

## 2022-10-18 DIAGNOSIS — R079 Chest pain, unspecified: Secondary | ICD-10-CM | POA: Diagnosis present

## 2022-10-18 LAB — COMPREHENSIVE METABOLIC PANEL
ALT: 13 U/L (ref 0–44)
AST: 16 U/L (ref 15–41)
Albumin: 3.3 g/dL — ABNORMAL LOW (ref 3.5–5.0)
Alkaline Phosphatase: 84 U/L (ref 38–126)
Anion gap: 9 (ref 5–15)
BUN: 12 mg/dL (ref 6–20)
CO2: 25 mmol/L (ref 22–32)
Calcium: 9 mg/dL (ref 8.9–10.3)
Chloride: 103 mmol/L (ref 98–111)
Creatinine, Ser: 1.17 mg/dL (ref 0.61–1.24)
GFR, Estimated: >60 mL/min (ref >60)
Glucose, Bld: 133 mg/dL — ABNORMAL HIGH (ref 70–99)
Potassium: 3.7 mmol/L (ref 3.5–5.1)
Sodium: 137 mmol/L (ref 135–145)
Total Bilirubin: 0.3 mg/dL (ref 0.3–1.2)
Total Protein: 6.8 g/dL (ref 6.5–8.1)

## 2022-10-18 LAB — CBC WITH DIFFERENTIAL/PLATELET
Abs Immature Granulocytes: 0.02 10*3/uL (ref 0.00–0.07)
Basophils Absolute: 0 10*3/uL (ref 0.0–0.1)
Basophils Relative: 0 %
Eosinophils Absolute: 0.1 10*3/uL (ref 0.0–0.5)
Eosinophils Relative: 2 %
HCT: 35.9 % — ABNORMAL LOW (ref 39.0–52.0)
Hemoglobin: 12.3 g/dL — ABNORMAL LOW (ref 13.0–17.0)
Immature Granulocytes: 0 %
Lymphocytes Relative: 26 %
Lymphs Abs: 1.8 10*3/uL (ref 0.7–4.0)
MCH: 31.7 pg (ref 26.0–34.0)
MCHC: 34.3 g/dL (ref 30.0–36.0)
MCV: 92.5 fL (ref 80.0–100.0)
Monocytes Absolute: 0.4 10*3/uL (ref 0.1–1.0)
Monocytes Relative: 6 %
Neutro Abs: 4.5 10*3/uL (ref 1.7–7.7)
Neutrophils Relative %: 66 %
Platelets: 296 10*3/uL (ref 150–400)
RBC: 3.88 MIL/uL — ABNORMAL LOW (ref 4.22–5.81)
RDW: 12.6 % (ref 11.5–15.5)
WBC: 7 10*3/uL (ref 4.0–10.5)
nRBC: 0 % (ref 0.0–0.2)

## 2022-10-18 LAB — HEPARIN LEVEL (UNFRACTIONATED): Heparin Unfractionated: 0.74 IU/mL — ABNORMAL HIGH (ref 0.30–0.70)

## 2022-10-18 LAB — PROTIME-INR
INR: 1 (ref 0.8–1.2)
Prothrombin Time: 13.3 seconds (ref 11.4–15.2)

## 2022-10-18 LAB — LIPASE, BLOOD: Lipase: 38 U/L (ref 11–51)

## 2022-10-18 LAB — TROPONIN I (HIGH SENSITIVITY)
Troponin I (High Sensitivity): 5 ng/L (ref <18)
Troponin I (High Sensitivity): 5 ng/L (ref <18)

## 2022-10-18 LAB — ANTITHROMBIN III: AntiThromb III Func: 109 % (ref 75–120)

## 2022-10-18 LAB — ETHANOL: Alcohol, Ethyl (B): <10 mg/dL (ref <10)

## 2022-10-18 LAB — APTT: aPTT: 29 seconds (ref 24–36)

## 2022-10-18 MED ORDER — SENNA 8.6 MG PO TABS
1.0000 | ORAL_TABLET | Freq: Two times a day (BID) | ORAL | Status: DC
  Administered 2022-10-18 – 2022-10-19 (×2): 8.6 mg via ORAL
  Filled 2022-10-18 (×2): qty 1

## 2022-10-18 MED ORDER — LORAZEPAM 2 MG/ML IJ SOLN
0.5000 mg | Freq: Once | INTRAMUSCULAR | Status: AC
  Administered 2022-10-18: 0.5 mg via INTRAVENOUS
  Filled 2022-10-18: qty 1

## 2022-10-18 MED ORDER — AMLODIPINE BESYLATE 10 MG PO TABS
10.0000 mg | ORAL_TABLET | Freq: Every day | ORAL | Status: DC
  Administered 2022-10-19: 10 mg via ORAL
  Filled 2022-10-18: qty 1

## 2022-10-18 MED ORDER — HEPARIN (PORCINE) 25000 UT/250ML-% IV SOLN
1300.0000 [IU]/h | INTRAVENOUS | Status: DC
  Administered 2022-10-18 (×2): 1500 [IU]/h via INTRAVENOUS
  Administered 2022-10-19: 1400 [IU]/h via INTRAVENOUS
  Filled 2022-10-18 (×3): qty 250

## 2022-10-18 MED ORDER — TRAZODONE HCL 50 MG PO TABS: 100.0000 mg | ORAL_TABLET | Freq: Every evening | ORAL | Status: DC | PRN

## 2022-10-18 MED ORDER — SODIUM CHLORIDE 0.9% FLUSH: 3.0000 mL | INTRAVENOUS | Status: DC | PRN

## 2022-10-18 MED ORDER — IOHEXOL 350 MG/ML SOLN
75.0000 mL | Freq: Once | INTRAVENOUS | Status: AC | PRN
  Administered 2022-10-18: 75 mL via INTRAVENOUS

## 2022-10-18 MED ORDER — HYDROMORPHONE HCL 1 MG/ML IJ SOLN: 0.5000 mg | INTRAMUSCULAR | Status: DC | PRN

## 2022-10-18 MED ORDER — ACETAMINOPHEN 650 MG RE SUPP: 650.0000 mg | Freq: Four times a day (QID) | RECTAL | Status: DC | PRN

## 2022-10-18 MED ORDER — PANTOPRAZOLE SODIUM 40 MG PO TBEC
40.0000 mg | DELAYED_RELEASE_TABLET | Freq: Every day | ORAL | Status: DC
  Administered 2022-10-19: 40 mg via ORAL
  Filled 2022-10-18: qty 1

## 2022-10-18 MED ORDER — HALOPERIDOL LACTATE 5 MG/ML IJ SOLN: 5.0000 mg | Freq: Once | INTRAMUSCULAR | Status: DC

## 2022-10-18 MED ORDER — HEPARIN BOLUS VIA INFUSION
5500.0000 [IU] | Freq: Once | INTRAVENOUS | Status: AC
  Administered 2022-10-18: 5500 [IU] via INTRAVENOUS
  Filled 2022-10-18: qty 5500

## 2022-10-18 MED ORDER — ASPIRIN 81 MG PO TBEC
81.0000 mg | DELAYED_RELEASE_TABLET | Freq: Every day | ORAL | Status: DC
  Administered 2022-10-19: 81 mg via ORAL
  Filled 2022-10-18: qty 1

## 2022-10-18 MED ORDER — GABAPENTIN 300 MG PO CAPS
300.0000 mg | ORAL_CAPSULE | Freq: Every day | ORAL | Status: DC
  Administered 2022-10-18: 300 mg via ORAL
  Filled 2022-10-18: qty 1

## 2022-10-18 MED ORDER — SODIUM CHLORIDE 0.9% FLUSH
3.0000 mL | Freq: Two times a day (BID) | INTRAVENOUS | Status: DC
  Administered 2022-10-18 – 2022-10-19 (×2): 3 mL via INTRAVENOUS

## 2022-10-18 MED ORDER — LORAZEPAM 2 MG/ML IJ SOLN
1.0000 mg | Freq: Once | INTRAMUSCULAR | Status: AC
  Administered 2022-10-18: 1 mg via INTRAMUSCULAR
  Filled 2022-10-18: qty 1

## 2022-10-18 MED ORDER — ACETAMINOPHEN 325 MG PO TABS: 650.0000 mg | ORAL_TABLET | Freq: Four times a day (QID) | ORAL | Status: DC | PRN

## 2022-10-18 MED ORDER — SODIUM CHLORIDE 0.9 % IV SOLN: 250.0000 mL | INTRAVENOUS | Status: DC | PRN

## 2022-10-18 MED ORDER — VITAMIN D 25 MCG (1000 UNIT) PO TABS
5000.0000 [IU] | ORAL_TABLET | Freq: Every day | ORAL | Status: DC
  Administered 2022-10-19: 5000 [IU] via ORAL
  Filled 2022-10-18: qty 5

## 2022-10-18 MED ORDER — HALOPERIDOL LACTATE 5 MG/ML IJ SOLN
5.0000 mg | Freq: Once | INTRAMUSCULAR | Status: AC
  Administered 2022-10-18: 5 mg via INTRAMUSCULAR
  Filled 2022-10-18: qty 1

## 2022-10-18 MED ORDER — DIVALPROEX SODIUM 500 MG PO DR TAB
500.0000 mg | DELAYED_RELEASE_TABLET | Freq: Two times a day (BID) | ORAL | Status: DC
  Administered 2022-10-18 – 2022-10-19 (×2): 500 mg via ORAL
  Filled 2022-10-18 (×3): qty 1

## 2022-10-18 MED ORDER — ARIPIPRAZOLE 5 MG PO TABS
10.0000 mg | ORAL_TABLET | Freq: Every day | ORAL | Status: DC
  Administered 2022-10-19: 10 mg via ORAL
  Filled 2022-10-18: qty 2

## 2022-10-18 NOTE — Consult Note (Addendum)
NAME:  Charles Estes, MRN:  [id number], DOB:  [birth year], LOS: 0 ADMISSION DATE:  10/18/2022, CONSULTATION DATE: 5/17 REFERRING MD: Dr. Lockie Mola, CHIEF COMPLAINT: PE  History of Present Illness:  62 year old male with past medical history significant for hypertension, bipolar disorder, and chronic back pain presented to [facility] emergency department on 5/17 with complaints of chest pain and "crazy thoughts".  Chest pain onset on the day of presentation while eating pizza.  Also admits to cocaine use in that same time period.  Patient also complains of homelessness.  He is also having homicidal thoughts and hearing voices.  Denies suicidal ideation to the emergency department staff.  Chest pain workup negative for ischemia.  CTA dissection study showed a mildly dilated ascending aorta and chronic bilateral appearing pulmonary emboli.  PCCM was consulted in the setting of pulmonary embolism.  The patient does not have a personal history of clot, however, he does have a family history of clots which claimed the life of his sister.  He denies shortness of breath at rest but would recently get winded when climbing flights of stairs.  He thought he was just out of shape.  He does endorse lower extremity edema which is unilateral, however, he cannot remember if it is the right of the left.  Lower extremity pain and cramping is chronic due to low back pain no recent change.  He drives approximately 3-1/2 hours without stopping twice a month.  Pertinent  Medical History   has a past medical history of Depression, GERD (gastroesophageal reflux disease), Hypertension, and Pathologic ulnar fracture with malunion.   Significant Hospital Events: Including procedures, antibiotic start and stop dates in addition to other pertinent events   5/17 admit with PE, homicidal ideation  Interim History / Subjective:    Objective   Blood pressure (!) 149/103, pulse 69, temperature 98.5 F (36.9 C), temperature source  Oral, resp. rate 15, height 5\' 6"  (1.676 m), weight 90.7 kg, SpO2 100 %.        Intake/Output Summary (Last 24 hours) at 10/18/2022 1332 Last data filed at 10/18/2022 1320 Gross per 24 hour  Intake --  Output 775 ml  Net -775 ml   Filed Weights   10/18/22 0908  Weight: 90.7 kg    Examination: General: overweight middle aged male in NAD HENT: [place]/AT, PERRL, no JVD Lungs: Clear bilateral breath sounds Cardiovascular: RRR, no MRG Abdomen: Soft, NT, ND Extremities: No acute deformity Neuro: Alert, oriented, non-focal  Resolved Hospital Problem list     Assessment & Plan:   Bilateral pulmonary emboli: segmental branches. RV/LV 1.2. sPESI - 0 low risk.  - Recommend medical admission for workup of chronic PE and pulmonary hypertension.  - Lower extremity dopplers - Echocardiogram - With family history it may be worthwhile to send a hypercoagulable workup before we start AC > Pending - Ensure hypercoag labs are sent prior to first AC dose.  - IV heparin for now - Ultimately will anticoagulate with whichever DOAC he will be most able to obtain and be compliant with. Eliquis preferred. TOC consulted.  -  no need for IR involvement or thrombolytics at this time.   Homicidal ideation Bipolar disorder - He has not been taking any medications - Psychiatry consulted by ED.   Best Practice (right click and "Reselect all SmartList Selections" daily)   Per primary  Labs   CBC: Recent Labs  Lab 10/18/22 0854  WBC 7.0  NEUTROABS 4.5  HGB 12.3*  HCT 35.9*  MCV 92.5  PLT 296    Basic Metabolic Panel: Recent Labs  Lab 10/18/22 0854  NA 137  K 3.7  CL 103  CO2 25  GLUCOSE 133*  BUN 12  CREATININE 1.17  CALCIUM 9.0   GFR: Estimated Creatinine Clearance: 71.7 mL/min (by C-G formula based on SCr of 1.17 mg/dL). Recent Labs  Lab 10/18/22 0854  WBC 7.0    Liver Function Tests: Recent Labs  Lab 10/18/22 0854  AST 16  ALT 13  ALKPHOS 84  BILITOT 0.3  PROT 6.8   ALBUMIN 3.3*   Recent Labs  Lab 10/18/22 0854  LIPASE 38   No results for input(s): "AMMONIA" in the last 168 hours.  ABG    Component Value Date/Time   PHART 7.365 03/23/2022 0230   PCO2ART 32.9 03/23/2022 0230   PO2ART 79 (L) 03/23/2022 0230   HCO3 18.9 (L) 03/23/2022 0230   TCO2 20 (L) 03/23/2022 0230   ACIDBASEDEF 6.0 (H) 03/23/2022 0230   O2SAT 95 03/23/2022 0230     Coagulation Profile: No results for input(s): "INR", "PROTIME" in the last 168 hours.  Cardiac Enzymes: No results for input(s): "CKTOTAL", "CKMB", "CKMBINDEX", "TROPONINI" in the last 168 hours.  HbA1C: Hgb A1c MFr Bld  Date/Time Value Ref Range Status  08/28/2022 06:26 AM 6.3 (H) 4.8 - 5.6 % Final    Comment:    (NOTE)         Prediabetes: 5.7 - 6.4         Diabetes: >6.4         Glycemic control for adults with diabetes: <7.0   01/21/2022 06:27 AM 6.0 (H) 4.8 - 5.6 % Final    Comment:    (NOTE) Pre diabetes:          5.7%-6.4%  Diabetes:              >6.4%  Glycemic control for   <7.0% adults with diabetes     CBG: No results for input(s): "GLUCAP" in the last 168 hours.  Review of Systems:   Bolds are positive  Constitutional: weight loss, gain, night sweats, Fevers, chills, fatigue .  HEENT: headaches, Sore throat, sneezing, nasal congestion, post nasal drip, Difficulty swallowing, Tooth/dental problems, visual complaints visual changes, ear ache CV:  chest pain, radiates left armpit, sharp:,Orthopnea, PND, swelling in lower extremities, dizziness, palpitations, syncope.  GI  heartburn, indigestion, abdominal pain, nausea, vomiting, diarrhea, change in bowel habits, loss of appetite, bloody stools.  Resp: cough, productive: , hemoptysis, dyspnea on exertion, chest pain, pleuritic.  Skin: rash or itching or icterus GU: dysuria, change in color of urine, urgency or frequency. flank pain, hematuria  MS: joint pain or swelling. decreased range of motion  Psych: change in mood or  affect. depression or anxiety.  Neuro: difficulty with speech, weakness, numbness, ataxia    Past Medical History:  He,  has a past medical history of Depression, GERD (gastroesophageal reflux disease), Hypertension, and Pathologic ulnar fracture with malunion.   Surgical History:   Past Surgical History:  Procedure Laterality Date   MANDIBLE FRACTURE SURGERY  2014   ORIF ULNAR FRACTURE Right 07/16/2017   Procedure: OPEN REDUCTION INTERNAL FIXATION (ORIF) RIGHT ULNA FRACTURE NONUNION;  Surgeon: Tarry Kos, MD;  Location: [facility];  Service: Orthopedics;  Laterality: Right;     Social History:   reports that he has never smoked. He has never used smokeless tobacco. He reports current alcohol use. He reports current drug use.  Drug: Cocaine.   Family History:  His family history includes Heart attack in his father, mother, sister, and sister; Hypertension in his brother, father, mother, and sister.   Allergies Allergies  Allergen Reactions   Paxil [Paroxetine] Diarrhea and Other (See Comments)    Mouth sores     Home Medications  Prior to Admission medications   Medication Sig Start Date End Date Taking? Authorizing Provider  amLODipine (NORVASC) 10 MG tablet Take 1 tablet (10 mg total) by mouth daily. 03/29/22   Leroy Sea, MD  ARIPiprazole (ABILIFY) 10 MG tablet Take 1 tablet (10 mg total) by mouth daily. 09/04/22 10/04/22  Karie Fetch, MD  aspirin EC 81 MG tablet Take 81 mg by mouth daily.    [provider]  cholecalciferol (VITAMIN D3) 25 MCG (1000 UNIT) tablet Take 5 tablets (5,000 Units total) by mouth daily with breakfast. 08/27/22   Ardis Hughs, NP  divalproex (DEPAKOTE) 500 MG DR tablet Take 1 tablet (500 mg total) by mouth every 12 (twelve) hours. 09/04/22 10/04/22  Karie Fetch, MD  gabapentin (NEURONTIN) 300 MG capsule Take 1 capsule (300 mg total) by mouth at bedtime. 09/04/22 10/04/22  Karie Fetch, MD  pantoprazole  (PROTONIX) 40 MG tablet Take 1 tablet (40 mg total) by mouth daily. 03/28/22   Leroy Sea, MD  traZODone (DESYREL) 100 MG tablet Take 1 tablet (100 mg total) by mouth at bedtime as needed for sleep. 09/04/22 10/04/22  Karie Fetch, MD  calcium-vitamin D (OSCAL WITH D) 500-200 MG-UNIT tablet Take 1 tablet by mouth 3 (three) times daily. Patient not taking: Reported on 11/20/2017 07/16/17 10/23/20  Tarry Kos, MD  promethazine (PHENERGAN) 25 MG tablet Take 1 tablet (25 mg total) by mouth every 6 (six) hours as needed for nausea. Patient not taking: Reported on 11/20/2017 07/16/17 10/23/20  Tarry Kos, MD      Joneen Roach, AGACNP-BC [facility]  See Amion for personal pager PCCM on call pager [phone] until 7pm. Please call Elink 7p-7a. [phone]  10/18/2022 2:18 PM       ,

## 2022-10-18 NOTE — ED Notes (Signed)
ED TO INPATIENT HANDOFF REPORT  ED Nurse Name and Phone #: Ailene Rud  S Name/Age/Gender Charles Estes 62 y.o. male Room/Bed: 034C/034C  Code Status   Code Status: Full Code  Home/SNF/Other Home Patient oriented to: self, place, time, and situation Is this baseline? Yes   Triage Complete: Triage complete  Chief Complaint Pulmonary embolism (HCC) [I26.99]  Triage Note Pt reports waking up this morning with back pain after sleeping in his truck. Also reports L sided chest pain and under L arm since 0600 today. Endorses nausea without vomiting and arrives eating a large [employer] pizza without any evidence of nausea.    Allergies Allergies  Allergen Reactions   Paxil [Paroxetine] Diarrhea and Other (See Comments)    Mouth sores    Level of Care/Admitting Diagnosis ED Disposition     ED Disposition  Admit   Condition  --   Comment  Hospital Area: [facility] [[id number]]  Level of Care: Med-Surg [16]  May admit patient to [facility] or [facility] if equivalent level of care is available:: No  Covid Evaluation: Asymptomatic - no recent exposure (last 10 days) testing not required  Diagnosis: Pulmonary embolism (HCC) [241700]  Admitting Physician: Jacques Navy [[id number]]  Attending Physician: Jacques Navy [[id number]]  Certification:: I certify this patient will need inpatient services for at least 2 midnights  Estimated Length of Stay: 4          B Medical/Surgery History Past Medical History:  Diagnosis Date   Depression    GERD (gastroesophageal reflux disease)    Hypertension    Pathologic ulnar fracture with malunion    right   Past Surgical History:  Procedure Laterality Date   MANDIBLE FRACTURE SURGERY  2014   ORIF ULNAR FRACTURE Right 07/16/2017   Procedure: OPEN REDUCTION INTERNAL FIXATION (ORIF) RIGHT ULNA FRACTURE NONUNION;  Surgeon: Tarry Kos, MD;  Location: [facility];  Service: Orthopedics;  Laterality:  Right;     A IV Location/Drains/Wounds Patient Lines/Drains/Airways Status     Active Line/Drains/Airways     Name Placement date Placement time Site Days   Peripheral IV 10/18/22 20 G Anterior;Right Forearm 10/18/22  0855  Forearm  less than 1   Pressure Injury 03/23/22 Buttocks Bilateral Stage 1 -  Intact skin with non-blanchable redness of a localized area usually over a bony prominence. 03/23/22  1640  -- 209            Intake/Output Last 24 hours  Intake/Output Summary (Last 24 hours) at 10/18/2022 1553 Last data filed at 10/18/2022 1320 Gross per 24 hour  Intake --  Output 775 ml  Net -775 ml    Labs/Imaging Results for orders placed or performed during the hospital encounter of 10/18/22 (from the past 48 hour(s))  CBC with Differential     Status: Abnormal   Collection Time: 10/18/22  8:54 AM  Result Value Ref Range   WBC 7.0 4.0 - 10.5 K/uL   RBC 3.88 (L) 4.22 - 5.81 MIL/uL   Hemoglobin 12.3 (L) 13.0 - 17.0 g/dL   HCT 16.1 (L) 09.6 - 04.5 %   MCV 92.5 80.0 - 100.0 fL   MCH 31.7 26.0 - 34.0 pg   MCHC 34.3 30.0 - 36.0 g/dL   RDW 40.9 81.1 - 91.4 %   Platelets 296 150 - 400 K/uL   nRBC 0.0 0.0 - 0.2 %   Neutrophils Relative % 66 %   Neutro Abs 4.5  1.7 - 7.7 K/uL   Lymphocytes Relative 26 %   Lymphs Abs 1.8 0.7 - 4.0 K/uL   Monocytes Relative 6 %   Monocytes Absolute 0.4 0.1 - 1.0 K/uL   Eosinophils Relative 2 %   Eosinophils Absolute 0.1 0.0 - 0.5 K/uL   Basophils Relative 0 %   Basophils Absolute 0.0 0.0 - 0.1 K/uL   Immature Granulocytes 0 %   Abs Immature Granulocytes 0.02 0.00 - 0.07 K/uL    Comment: Performed at [facility], [address], [place], [place] [address]  Comprehensive metabolic panel     Status: Abnormal   Collection Time: 10/18/22  8:54 AM  Result Value Ref Range   Sodium 137 135 - 145 mmol/L   Potassium 3.7 3.5 - 5.1 mmol/L   Chloride 103 98 - 111 mmol/L   CO2 25 22 - 32 mmol/L   Glucose, Bld 133 (H) 70 - 99 mg/dL     Comment: Glucose reference range applies only to samples taken after fasting for at least 8 hours.   BUN 12 6 - 20 mg/dL   Creatinine, Ser 6.04 0.61 - 1.24 mg/dL   Calcium 9.0 8.9 - 54.0 mg/dL   Total Protein 6.8 6.5 - 8.1 g/dL   Albumin 3.3 (L) 3.5 - 5.0 g/dL   AST 16 15 - 41 U/L   ALT 13 0 - 44 U/L   Alkaline Phosphatase 84 38 - 126 U/L   Total Bilirubin 0.3 0.3 - 1.2 mg/dL   GFR, Estimated >98 >11 mL/min    Comment: (NOTE) Calculated using the CKD-EPI Creatinine Equation (2021)    Anion gap 9 5 - 15    Comment: Performed at [facility], [address] [place], [place] [address]  Lipase, blood     Status: None   Collection Time: 10/18/22  8:54 AM  Result Value Ref Range   Lipase 38 11 - 51 U/L    Comment: Performed at [facility], [address], [place], [place] [address]  Troponin I (High Sensitivity)     Status: None   Collection Time: 10/18/22  8:54 AM  Result Value Ref Range   Troponin I (High Sensitivity) 5 <18 ng/L    Comment: (NOTE) Elevated high sensitivity troponin I (hsTnI) values and significant  changes across serial measurements may suggest ACS but many other  chronic and acute conditions are known to elevate hsTnI results.  Refer to the "Links" section for chest pain algorithms and additional  guidance. Performed at [facility], [address], [place], [place] [address]   Ethanol     Status: None   Collection Time: 10/18/22  9:07 AM  Result Value Ref Range   Alcohol, Ethyl (B) <10 <10 mg/dL    Comment: (NOTE) Lowest detectable limit for serum alcohol is 10 mg/dL.  For medical purposes only. Performed at [facility], [address], [place], [place] [address]   Troponin I (High Sensitivity)     Status: None   Collection Time: 10/18/22 10:48 AM  Result Value Ref Range   Troponin I (High Sensitivity) 5 <18 ng/L    Comment: (NOTE) Elevated high sensitivity troponin I (hsTnI) values and significant  changes across serial  measurements may suggest ACS but many other  chronic and acute conditions are known to elevate hsTnI results.  Refer to the "Links" section for chest pain algorithms and additional  guidance. Performed at [facility], [address] [place], [place] [address]   Antithrombin III     Status: None   Collection Time: 10/18/22  2:35 PM  Result Value Ref Range   AntiThromb III Func 109 75 - 120 %    Comment: Performed at [facility], [address] [place], [place] [address]  Protime-INR     Status: None   Collection Time: 10/18/22  2:35 PM  Result Value Ref Range   Prothrombin Time 13.3 11.4 - 15.2 seconds   INR 1.0 0.8 - 1.2    Comment: (NOTE) INR goal varies based on device and disease states. Performed at [facility], [address], [place], [place] [address]   APTT     Status: None   Collection Time: 10/18/22  2:35 PM  Result Value Ref Range   aPTT 29 24 - 36 seconds    Comment: Performed at [facility], [address], [place], [place] [address]   CT Angio Chest/Abd/Pel for Dissection W and/or Wo Contrast  Result Date: 10/18/2022 CLINICAL DATA:  Acute aortic syndrome back pain while sleeping and truck left-sided chest pain extending under left arm. Nausea without vomiting. EXAM: CT ANGIOGRAPHY CHEST, ABDOMEN AND PELVIS TECHNIQUE: Non-contrast CT of the chest was initially obtained. Multidetector CT imaging through the chest, abdomen and pelvis was performed using the standard protocol during bolus administration of intravenous contrast. Multiplanar reconstructed images and MIPs were obtained and reviewed to evaluate the vascular anatomy. RADIATION DOSE REDUCTION: This exam was performed according to the departmental dose-optimization program which includes automated exposure control, adjustment of the mA and/or kV according to patient size and/or use of iterative reconstruction technique. CONTRAST:  75 mL OMNIPAQUE IOHEXOL 350 MG/ML SOLN COMPARISON:   01/22/2022 FINDINGS: CTA CHEST FINDINGS Cardiovascular: No aortic intramural hematoma identified on precontrast images. Mildly dilated ascending thoracic aorta measuring up to 4.1 cm. Extensive coronary artery calcifications. Heart size is within normal limits Nonocclusive eccentric pulmonary emboli seen within right upper lobe segmental branches, best seen on images 54, 56, 59 of series 7. Additional nonocclusive eccentric thrombus also noted within the posterior basilar branch of the right lower lobe pulmonary artery, image 77 of series 7. Nonocclusive eccentric thrombus seen in the posterior basilar branch of the left lower lobe (image 78, series 7). RV/LV ratio measures 1.2. Mediastinum/Nodes: No enlarged axillary, hilar, or mediastinal lymph nodes. Thyroid is unremarkable. No significant abnormality of the esophagus. Lungs/Pleura: Mild bibasilar atelectasis. No focal airspace opacity to indicate pneumonia or pulmonary infarction. Musculoskeletal: No chest wall abnormality. No acute or significant osseous findings. Review of the MIP images confirms the above findings. CTA ABDOMEN AND PELVIS FINDINGS VASCULAR Aorta: Mild scattered atheromatous calcified plaque without aneurysm or flow-limiting stenosis. No dissection. Celiac: Patent without evidence of aneurysm, dissection, vasculitis or significant stenosis. SMA: Patent without evidence of aneurysm, dissection, vasculitis or significant stenosis. Renals: No significant stenosis of main renal arteries. At least moderate stenosis at the origin of accessory left lower pole renal artery. IMA: Patent without evidence of aneurysm, dissection, vasculitis or significant stenosis. Inflow: Patent without evidence of aneurysm, dissection, vasculitis or significant stenosis. Veins: No obvious venous abnormality within the limitations of this arterial phase study. Review of the MIP images confirms the above findings. NON-VASCULAR Hepatobiliary: No focal liver abnormality is  seen. No gallstones, gallbladder wall thickening, or biliary dilatation. Pancreas: Unremarkable. No pancreatic ductal dilatation or surrounding inflammatory changes. Spleen: Punctate splenic calcifications likely result of prior granulomatous inflammation. Spleen otherwise normal.  Adrenals/Urinary Tract: Adrenal glands are unremarkable. 7 mm hypodense structure in the posterior cortex of the mid left kidney is too small to fully characterize. This likely represents a small simple cyst and does not require dedicated imaging follow-up. Kidneys are otherwise normal, without renal calculi, focal lesion, or hydronephrosis. Bladder is unremarkable. Stomach/Bowel: Stomach is within normal limits. Appendix appears normal. No evidence of bowel wall thickening, distention, or inflammatory changes. Lymphatic: No enlarged abdominal or pelvic lymph nodes. Reproductive: Mild prostatomegaly. Other: No abdominal wall hernia or abnormality. No abdominopelvic ascites. Musculoskeletal: No acute or significant osseous findings. Review of the MIP images confirms the above findings. IMPRESSION: 1. No aortic dissection or aneurysm. 2. Mildly dilated ascending thoracic aorta measuring up to 4.1 cm. 3. Nonocclusive eccentric pulmonary emboli seen within segmental branches of both lungs, favored to be chronic rather than acute. RV/LV ratio measures 1.2. 4. Extensive coronary artery calcifications. 5. No acute abnormality of the abdomen or pelvis. Electronically Signed   By: Acquanetta Belling M.D.   On: 10/18/2022 12:00   DG Chest Portable 1 View  Result Date: 10/18/2022 CLINICAL DATA:  Chest pain EXAM: PORTABLE CHEST 1 VIEW COMPARISON:  CXR 3 /22/24 FINDINGS: No pleural effusion. No pneumothorax normal cardiac and mediastinal contours. No focal airspace opacity. No radiographically apparent displaced rib fractures. Visualized upper abdomen is unremarkable. IMPRESSION: No focal airspace opacity Electronically Signed   By: Lorenza Cambridge M.D.    On: 10/18/2022 09:14    Pending Labs Unresulted Labs (From admission, onward)     Start     Ordered   10/19/22 0500  Heparin level (unfractionated)  Daily,   R      10/18/22 1503   10/18/22 2200  Heparin level (unfractionated)  Once-Timed,   URGENT        10/18/22 1503   10/18/22 1414  Protein C activity  (Hypercoagulable Panel, Comprehensive (PNL))  Once,   URGENT        10/18/22 1414   10/18/22 1414  Protein C, total  (Hypercoagulable Panel, Comprehensive (PNL))  Once,   URGENT        10/18/22 1414   10/18/22 1414  Protein S activity  (Hypercoagulable Panel, Comprehensive (PNL))  Once,   URGENT        10/18/22 1414   10/18/22 1414  Protein S, total  (Hypercoagulable Panel, Comprehensive (PNL))  Once,   URGENT        10/18/22 1414   10/18/22 1414  Lupus anticoagulant panel  (Hypercoagulable Panel, Comprehensive (PNL))  Once,   URGENT        10/18/22 1414   10/18/22 1414  Beta-2-glycoprotein i abs, IgG/M/A  (Hypercoagulable Panel, Comprehensive (PNL))  Once,   URGENT        10/18/22 1414   10/18/22 1414  Homocysteine, serum  (Hypercoagulable Panel, Comprehensive (PNL))  Once,   URGENT        10/18/22 1414   10/18/22 1414  Factor 5 leiden  (Hypercoagulable Panel, Comprehensive (PNL))  Once,   URGENT        10/18/22 1414   10/18/22 1414  Prothrombin gene mutation  (Hypercoagulable Panel, Comprehensive (PNL))  Once,   URGENT        10/18/22 1414   10/18/22 1414  Cardiolipin antibodies, IgG, IgM, IgA  (Hypercoagulable Panel, Comprehensive (PNL))  Once,   URGENT        10/18/22 1414   10/18/22 0900  Rapid urine drug screen (hospital performed)  ONCE - STAT,  STAT        10/18/22 0859            Vitals/Pain Today's Vitals   10/18/22 1120 10/18/22 1200 10/18/22 1400 10/18/22 1500  BP:  (!) 149/103 (!) 138/102 (!) 161/99  Pulse:  69 60 64  Resp:  15 13 14   Temp: 98.5 F (36.9 C)   98.5 F (36.9 C)  TempSrc: Oral   Oral  SpO2:  100% 100% 100%  Weight:      Height:       PainSc:        Isolation Precautions No active isolations  Medications Medications  heparin ADULT infusion 100 units/mL (25000 units/259mL) (1,500 Units/hr Intravenous New Bag/Given 10/18/22 1524)  aspirin EC tablet 81 mg (has no administration in time range)  amLODipine (NORVASC) tablet 10 mg (has no administration in time range)  ARIPiprazole (ABILIFY) tablet 10 mg (has no administration in time range)  traZODone (DESYREL) tablet 100 mg (has no administration in time range)  pantoprazole (PROTONIX) EC tablet 40 mg (has no administration in time range)  divalproex (DEPAKOTE) DR tablet 500 mg (has no administration in time range)  gabapentin (NEURONTIN) capsule 300 mg (has no administration in time range)  cholecalciferol (VITAMIN D3) 25 MCG (1000 UNIT) tablet 5,000 Units (has no administration in time range)  haloperidol lactate (HALDOL) injection 5 mg (has no administration in time range)  sodium chloride flush (NS) 0.9 % injection 3 mL (has no administration in time range)  sodium chloride flush (NS) 0.9 % injection 3 mL (has no administration in time range)  0.9 %  sodium chloride infusion (has no administration in time range)  acetaminophen (TYLENOL) tablet 650 mg (has no administration in time range)    Or  acetaminophen (TYLENOL) suppository 650 mg (has no administration in time range)  HYDROmorphone (DILAUDID) injection 0.5-1 mg (has no administration in time range)  senna (SENOKOT) tablet 8.6 mg (has no administration in time range)  LORazepam (ATIVAN) injection 0.5 mg (0.5 mg Intravenous Given 10/18/22 0906)  iohexol (OMNIPAQUE) 350 MG/ML injection 75 mL (75 mLs Intravenous Contrast Given 10/18/22 1118)  heparin bolus via infusion 5,500 Units (5,500 Units Intravenous Bolus from Bag 10/18/22 1525)  haloperidol lactate (HALDOL) injection 5 mg (5 mg Intramuscular Given 10/18/22 1529)  LORazepam (ATIVAN) injection 1 mg (1 mg Intramuscular Given 10/18/22 1529)    Mobility walks      Focused Assessments Pulmonary Assessment Handoff:  Lung sounds:   O2 Device: Room Air      R Recommendations: See Admitting Provider Note  Report given to:   Additional Notes: pt recently got verbally aggressive, see nursing note, be cautious with patient. IVC paperwork was just filled out for his homicidal ideation

## 2022-10-18 NOTE — Assessment & Plan Note (Signed)
No active complaints  Plan PPI

## 2022-10-18 NOTE — ED Triage Notes (Signed)
Pt reports waking up this morning with back pain after sleeping in his truck. Also reports L sided chest pain and under L arm since 0600 today. Endorses nausea without vomiting and arrives eating a large [employer] pizza without any evidence of nausea.

## 2022-10-18 NOTE — ED Notes (Signed)
RN at bedside to start heparin and patient began demanding food. This RN informed the patient they would have to speak to the MD and patient repeatedly began stating "y'all playing me, the doctor said I could eat hours ago and I ain't seen shit". This RN apologized and stated "I'm sorry but the doctor did not tell me that, let me reach out to him and see what he has to say". Patient then stated "if I can't get some food right now then imma go". This RN stated "I would advise against that since you are quite sick right now. I will ask the doctor in just a moment if you can in fact eat or not". Patient continued to repeat himself and demand food, including raising his voice and started spitting while talking. This RN stated "sir you need to lower your voice and stop speaking to me like that, I need more than 3 minutes to figure out if you can eat or not". Patient then stated "fuck you! Fuck you little white lady! Get out! GET OUT! Fuck you and shut your mouth!". Patient then sit up in the bed and started puffing out his chest at this RN. RN then left the room, security called to the bedside. MD notified.

## 2022-10-18 NOTE — Progress Notes (Signed)
Patient refused admission questions.
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
## 2022-10-18 NOTE — Assessment & Plan Note (Signed)
Patient with h/o SI/HI and disruptive behavior. Last admitted to [facility] in March '24. He may not be adherent to his medical regimen including psychiatric meds. He was endorsing thoughts of harm to others today and lacked cooperation with staff. Patient was dosed with IM haldol 2/2 agitation.  Plan Resume psychiatric meds per old records  Haloperidol prn for patient and staff safety

## 2022-10-18 NOTE — ED Notes (Signed)
IVC PROCESS STARTED.

## 2022-10-18 NOTE — ED Notes (Signed)
When speaking to patient and doing blood work pt stated "I want to kill people, do you ever want to kill people? I just be seeing people on the street and thinking of running them over". When asked if patient wants to kill anyone at the hospital patient stated "I don't know anyone here, so I don't know".

## 2022-10-18 NOTE — Progress Notes (Signed)
ANTICOAGULATION CONSULT NOTE - Initial Consult  Pharmacy Consult for Heparin Indication: pulmonary embolus  Allergies  Allergen Reactions   Paxil [Paroxetine] Diarrhea and Other (See Comments)    Mouth sores    Patient Measurements: Height: 5\' 6"  (167.6 cm) Weight: 90.7 kg (200 lb) IBW/kg (Calculated) : 63.8 Heparin Dosing Weight: 83 kg  Vital Signs: Temp: 98.5 F (36.9 C) (05/17 1120) Temp Source: Oral (05/17 1120) BP: 138/102 (05/17 1400) Pulse Rate: 60 (05/17 1400)  Labs: Recent Labs    10/18/22 0854 10/18/22 1048  HGB 12.3*  --   HCT 35.9*  --   PLT 296  --   CREATININE 1.17  --   TROPONINIHS 5 5    Estimated Creatinine Clearance: 71.7 mL/min (by C-G formula based on SCr of 1.17 mg/dL).   Medical History: Past Medical History:  Diagnosis Date   Depression    GERD (gastroesophageal reflux disease)    Hypertension    Pathologic ulnar fracture with malunion    right    Medications:  (Not in a hospital admission)  Scheduled:  Infusions:  PRN:   Assessment: 60 yom with a history of HTN, bipolar, chronic pain. Patient is presenting with chest pain. Heparin per pharmacy consult placed for pulmonary embolus.   CTA PE w/ mildly dilated ascending thoracic aorta and nonocclusive PE RV/LV 1.2 -- readout of CT stating favored to be chronic rather than acute.  Patient is not on anticoagulation prior to arrival.  Hgb 12.3; plt 296  Goal of Therapy:  Heparin level 0.3-0.7 units/ml Monitor platelets by anticoagulation protocol: Yes   Plan:  As of 10/18/22 3:01 PM, hypercoagulable labs are labeled as "in process" in EPIC Give IV heparin 5500 units bolus x 1 Start heparin infusion at 1500 units/hr Check anti-Xa level in 6 hours and daily while on heparin Continue to monitor H&H and platelets  Delmar Landau, PharmD, BCPS 10/18/2022 2:57 PM ED Clinical Pharmacist -  [phone]

## 2022-10-18 NOTE — Assessment & Plan Note (Signed)
Long standing diagnosis, exacerbated by polysubstance abuse.  Plan Resume prior psychiatric medications

## 2022-10-18 NOTE — ED Provider Notes (Signed)
[place] [facility] AT [facility] Provider Note   CSN: [id number] Arrival date & time: 10/18/22  0827     History  Chief Complaint  Patient presents with   Chest Pain   Back Pain   Mental Health Problem    Charles Estes is a 62 y.o. male.  Patient here with chest pain.  History of acid reflux, hypertension, depression, possibly bipolar disorder.  Chest pain tonight while eating pizza.  Does admit to cocaine use tonight.  He is having pain in the middle of his chest radiating to his back.  He is having nausea but no vomiting.  Patient states he is homeless.  States he is having thoughts of hurting people again.  He is hearing voices at times.  Denies any suicidal ideation.  He does not have any specific person he wants to harm but he is hearing voices telling him to hurt people.  He sees people and he thinks of hurting them.  Similar thoughts in the past.  Denies any shortness of breath.  The history is provided by the patient.       Home Medications Prior to Admission medications   Medication Sig Start Date End Date Taking? Authorizing Provider  amLODipine (NORVASC) 10 MG tablet Take 1 tablet (10 mg total) by mouth daily. 03/29/22   Leroy Sea, MD  ARIPiprazole (ABILIFY) 10 MG tablet Take 1 tablet (10 mg total) by mouth daily. 09/04/22 10/04/22  Karie Fetch, MD  aspirin EC 81 MG tablet Take 81 mg by mouth daily.    [provider]  cholecalciferol (VITAMIN D3) 25 MCG (1000 UNIT) tablet Take 5 tablets (5,000 Units total) by mouth daily with breakfast. 08/27/22   Ardis Hughs, NP  divalproex (DEPAKOTE) 500 MG DR tablet Take 1 tablet (500 mg total) by mouth every 12 (twelve) hours. 09/04/22 10/04/22  Karie Fetch, MD  gabapentin (NEURONTIN) 300 MG capsule Take 1 capsule (300 mg total) by mouth at bedtime. 09/04/22 10/04/22  Karie Fetch, MD  pantoprazole (PROTONIX) 40 MG tablet Take 1 tablet (40 mg total) by mouth daily. 03/28/22   Leroy Sea, MD  traZODone (DESYREL) 100 MG tablet Take 1 tablet (100 mg total) by mouth at bedtime as needed for sleep. 09/04/22 10/04/22  Karie Fetch, MD  calcium-vitamin D (OSCAL WITH D) 500-200 MG-UNIT tablet Take 1 tablet by mouth 3 (three) times daily. Patient not taking: Reported on 11/20/2017 07/16/17 10/23/20  Tarry Kos, MD  promethazine (PHENERGAN) 25 MG tablet Take 1 tablet (25 mg total) by mouth every 6 (six) hours as needed for nausea. Patient not taking: Reported on 11/20/2017 07/16/17 10/23/20  Tarry Kos, MD      Allergies    Paxil [paroxetine]    Review of Systems   Review of Systems  Physical Exam Updated Vital Signs BP (!) 138/102   Pulse 60   Temp 98.5 F (36.9 C) (Oral)   Resp 13   Ht 5\' 6"  (1.676 m)   Wt 90.7 kg   SpO2 100%   BMI 32.28 kg/m  Physical Exam Vitals and nursing note reviewed.  Constitutional:      General: He is not in acute distress.    Appearance: He is well-developed. He is not ill-appearing.  HENT:     Head: Normocephalic and atraumatic.  Eyes:     Extraocular Movements: Extraocular movements intact.     Conjunctiva/sclera: Conjunctivae normal.     Pupils: Pupils are equal, round,  and reactive to light.  Cardiovascular:     Rate and Rhythm: Normal rate and regular rhythm.     Pulses:          Radial pulses are 2+ on the right side and 2+ on the left side.     Heart sounds: Normal heart sounds. No murmur heard. Pulmonary:     Effort: Pulmonary effort is normal. No respiratory distress.     Breath sounds: Normal breath sounds.  Abdominal:     Palpations: Abdomen is soft.     Tenderness: There is no abdominal tenderness.  Musculoskeletal:        General: No swelling.     Cervical back: Normal range of motion and neck supple.  Skin:    General: Skin is warm and dry.     Capillary Refill: Capillary refill takes less than 2 seconds.  Neurological:     Mental Status: He is alert.  Psychiatric:     Comments: Is a little bit  agitated, does endorse some homicidal ideation, visual and auditory hallucinations at times, denies SI     ED Results / Procedures / Treatments   Labs (all labs ordered are listed, but only abnormal results are displayed) Labs Reviewed  CBC WITH DIFFERENTIAL/PLATELET - Abnormal; Notable for the following components:      Result Value   RBC 3.88 (*)    Hemoglobin 12.3 (*)    HCT 35.9 (*)    All other components within normal limits  COMPREHENSIVE METABOLIC PANEL - Abnormal; Notable for the following components:   Glucose, Bld 133 (*)    Albumin 3.3 (*)    All other components within normal limits  LIPASE, BLOOD  ETHANOL  RAPID URINE DRUG SCREEN, HOSP PERFORMED  ANTITHROMBIN III  PROTEIN C ACTIVITY  PROTEIN C, TOTAL  PROTEIN S ACTIVITY  PROTEIN S, TOTAL  LUPUS ANTICOAGULANT PANEL  BETA-2-GLYCOPROTEIN I ABS, IGG/M/A  HOMOCYSTEINE  FACTOR 5 LEIDEN  PROTHROMBIN GENE MUTATION  CARDIOLIPIN ANTIBODIES, IGG, IGM, IGA  TROPONIN I (HIGH SENSITIVITY)  TROPONIN I (HIGH SENSITIVITY)    EKG EKG Interpretation  Date/Time:  Friday Oct 18 2022 08:39:01 EDT Ventricular Rate:  73 PR Interval:  157 QRS Duration: 102 QT Interval:  423 QTC Calculation: 467 R Axis:   -3 Text Interpretation: Sinus rhythm Confirmed by Virgina Norfolk ([id number]) on 10/18/2022 8:42:08 AM  Radiology CT Angio Chest/Abd/Pel for Dissection W and/or Wo Contrast  Result Date: 10/18/2022 CLINICAL DATA:  Acute aortic syndrome back pain while sleeping and truck left-sided chest pain extending under left arm. Nausea without vomiting. EXAM: CT ANGIOGRAPHY CHEST, ABDOMEN AND PELVIS TECHNIQUE: Non-contrast CT of the chest was initially obtained. Multidetector CT imaging through the chest, abdomen and pelvis was performed using the standard protocol during bolus administration of intravenous contrast. Multiplanar reconstructed images and MIPs were obtained and reviewed to evaluate the vascular anatomy. RADIATION DOSE REDUCTION:  This exam was performed according to the departmental dose-optimization program which includes automated exposure control, adjustment of the mA and/or kV according to patient size and/or use of iterative reconstruction technique. CONTRAST:  75 mL OMNIPAQUE IOHEXOL 350 MG/ML SOLN COMPARISON:  01/22/2022 FINDINGS: CTA CHEST FINDINGS Cardiovascular: No aortic intramural hematoma identified on precontrast images. Mildly dilated ascending thoracic aorta measuring up to 4.1 cm. Extensive coronary artery calcifications. Heart size is within normal limits Nonocclusive eccentric pulmonary emboli seen within right upper lobe segmental branches, best seen on images 54, 56, 59 of series 7. Additional nonocclusive eccentric thrombus also noted  within the posterior basilar branch of the right lower lobe pulmonary artery, image 77 of series 7. Nonocclusive eccentric thrombus seen in the posterior basilar branch of the left lower lobe (image 78, series 7). RV/LV ratio measures 1.2. Mediastinum/Nodes: No enlarged axillary, hilar, or mediastinal lymph nodes. Thyroid is unremarkable. No significant abnormality of the esophagus. Lungs/Pleura: Mild bibasilar atelectasis. No focal airspace opacity to indicate pneumonia or pulmonary infarction. Musculoskeletal: No chest wall abnormality. No acute or significant osseous findings. Review of the MIP images confirms the above findings. CTA ABDOMEN AND PELVIS FINDINGS VASCULAR Aorta: Mild scattered atheromatous calcified plaque without aneurysm or flow-limiting stenosis. No dissection. Celiac: Patent without evidence of aneurysm, dissection, vasculitis or significant stenosis. SMA: Patent without evidence of aneurysm, dissection, vasculitis or significant stenosis. Renals: No significant stenosis of main renal arteries. At least moderate stenosis at the origin of accessory left lower pole renal artery. IMA: Patent without evidence of aneurysm, dissection, vasculitis or significant stenosis.  Inflow: Patent without evidence of aneurysm, dissection, vasculitis or significant stenosis. Veins: No obvious venous abnormality within the limitations of this arterial phase study. Review of the MIP images confirms the above findings. NON-VASCULAR Hepatobiliary: No focal liver abnormality is seen. No gallstones, gallbladder wall thickening, or biliary dilatation. Pancreas: Unremarkable. No pancreatic ductal dilatation or surrounding inflammatory changes. Spleen: Punctate splenic calcifications likely result of prior granulomatous inflammation. Spleen otherwise normal. Adrenals/Urinary Tract: Adrenal glands are unremarkable. 7 mm hypodense structure in the posterior cortex of the mid left kidney is too small to fully characterize. This likely represents a small simple cyst and does not require dedicated imaging follow-up. Kidneys are otherwise normal, without renal calculi, focal lesion, or hydronephrosis. Bladder is unremarkable. Stomach/Bowel: Stomach is within normal limits. Appendix appears normal. No evidence of bowel wall thickening, distention, or inflammatory changes. Lymphatic: No enlarged abdominal or pelvic lymph nodes. Reproductive: Mild prostatomegaly. Other: No abdominal wall hernia or abnormality. No abdominopelvic ascites. Musculoskeletal: No acute or significant osseous findings. Review of the MIP images confirms the above findings. IMPRESSION: 1. No aortic dissection or aneurysm. 2. Mildly dilated ascending thoracic aorta measuring up to 4.1 cm. 3. Nonocclusive eccentric pulmonary emboli seen within segmental branches of both lungs, favored to be chronic rather than acute. RV/LV ratio measures 1.2. 4. Extensive coronary artery calcifications. 5. No acute abnormality of the abdomen or pelvis. Electronically Signed   By: Acquanetta Belling M.D.   On: 10/18/2022 12:00   DG Chest Portable 1 View  Result Date: 10/18/2022 CLINICAL DATA:  Chest pain EXAM: PORTABLE CHEST 1 VIEW COMPARISON:  CXR 3 /22/24  FINDINGS: No pleural effusion. No pneumothorax normal cardiac and mediastinal contours. No focal airspace opacity. No radiographically apparent displaced rib fractures. Visualized upper abdomen is unremarkable. IMPRESSION: No focal airspace opacity Electronically Signed   By: Lorenza Cambridge M.D.   On: 10/18/2022 09:14    Procedures .Critical Care  Performed by: Virgina Norfolk, DO Authorized by: Virgina Norfolk, DO   Critical care provider statement:    Critical care time (minutes):  35   Critical care was necessary to treat or prevent imminent or life-threatening deterioration of the following conditions:  Respiratory failure   Critical care was time spent personally by me on the following activities:  Blood draw for specimens, development of treatment plan with patient or surrogate, discussions with consultants, discussions with primary provider, evaluation of patient's response to treatment, examination of patient, obtaining history from patient or surrogate, ordering and performing treatments and interventions, ordering and review of  laboratory studies, ordering and review of radiographic studies, pulse oximetry, re-evaluation of patient's condition and review of old charts   I assumed direction of critical care for this patient from another provider in my specialty: no       Medications Ordered in ED Medications  LORazepam (ATIVAN) injection 0.5 mg (0.5 mg Intravenous Given 10/18/22 0906)  iohexol (OMNIPAQUE) 350 MG/ML injection 75 mL (75 mLs Intravenous Contrast Given 10/18/22 1118)    ED Course/ Medical Decision Making/ A&P                             Medical Decision Making Amount and/or Complexity of Data Reviewed Labs: ordered. Radiology: ordered.  Risk Prescription drug management. Decision regarding hospitalization.   Marius Boice is here with chest pain homicidal ideation.  Normal vitals.  No fever.  Does admit to cocaine use.  Sounds like he has a psychiatric history, high  blood pressure.  Overall he appears well.  Sounds like he had discomfort in the chest rating to his back that started this morning.  He has been eating a [employer] that he says.  He has been having increased thoughts of hurting people but no one specific.  Denies SI.  He is homeless.  History of cocaine use and admits to use overnight.  Differential diagnosis likely acid reflux/vasospasm but will evaluate for ACS, dissection, infectious process, pancreatitis.  He does have some mental health problems at baseline.  On chart review he does have homicidal ideation in the past.  I suspect this could be some psychosis from cocaine use and other polysubstances.  Will do medical clearance and likely reach out to psychiatry.  Per my review and interpretation the labs is no significant anemia or electrolyte abnormality or kidney injury or leukocytosis.  Troponin negative x 2.  CT scan shows no dissection or aneurysm but there is a mildly dilated ascending thoracic aorta.  There are some nonocclusive eccentric pulmonary emboli seen within the segmental branches of both lungs.  Favored to be chronic rather than acute.  Chest x-ray otherwise unremarkable.  EKG was sinus rhythm.  No ischemic changes.  I have talked with pulmonary team and they will come down to evaluate the patient to give any recommendations about these chronic versus acute PEs.  Overall pulmonology evaluated the patient.  There is a strong family history of PEs.  Ultimately will start the patient on heparin bolus and infusion for what we suspect is probably acute PEs.  Hyper coagulable workup has been initiated by pulmonary team.  Ultimately will need to come up with oral anticoagulation that we think patient would adhere to.  This chart was dictated using voice recognition software.  Despite best efforts to proofread,  errors can occur which can change the documentation meaning.         Final Clinical Impression(s) / ED Diagnoses Final  diagnoses:  Acute pulmonary embolism, unspecified pulmonary embolism type, unspecified whether acute cor pulmonale present [facility])    Rx / DC Orders ED Discharge Orders     None         Virgina Norfolk, DO 10/18/22 1426

## 2022-10-18 NOTE — Assessment & Plan Note (Signed)
Patient found to have multiple emboli by CTA chest. He has a positive family h/o PE.  Plan Hypercoagulable work up - panel ordered  IV heparin  Echocardiogram to evaluate for right heart strain  Transition to NOAC  TOC to ensure patient has access to medications.

## 2022-10-18 NOTE — Progress Notes (Signed)
ANTICOAGULATION CONSULT NOTE  Pharmacy Consult for Heparin Indication: pulmonary embolus  Allergies  Allergen Reactions   Paxil [Paroxetine] Diarrhea and Other (See Comments)    Mouth sores    Patient Measurements: Height: 5\' 6"  (167.6 cm) Weight: 90.7 kg (200 lb) IBW/kg (Calculated) : 63.8 Heparin Dosing Weight: 83 kg  Vital Signs: Temp: 98.1 F (36.7 C) (05/17 1955) Temp Source: Oral (05/17 1955) BP: 133/99 (05/17 1955) Pulse Rate: 74 (05/17 1955)  Labs: Recent Labs    10/18/22 0854 10/18/22 1048 10/18/22 1435 10/18/22 2157  HGB 12.3*  --   --   --   HCT 35.9*  --   --   --   PLT 296  --   --   --   APTT  --   --  29  --   LABPROT  --   --  13.3  --   INR  --   --  1.0  --   HEPARINUNFRC  --   --   --  0.74*  CREATININE 1.17  --   --   --   TROPONINIHS 5 5  --   --      Estimated Creatinine Clearance: 71.7 mL/min (by C-G formula based on SCr of 1.17 mg/dL).   Assessment: 58 yom with a history of HTN, bipolar, chronic pain. Patient is presenting with chest pain. Heparin per pharmacy consult placed for pulmonary embolus.   CTA PE w/ mildly dilated ascending thoracic aorta and nonocclusive PE RV/LV 1.2 -- readout of CT stating favored to be chronic rather than acute.  Patient is not on anticoagulation prior to arrival.  Heparin level slightly elevated (0.74) on infusion at 1500 units/hr. No bleeding noted.  Goal of Therapy:  Heparin level 0.3-0.7 units/ml Monitor platelets by anticoagulation protocol: Yes   Plan:  Decrease heparin infusion to 1400 units/hr Check anti-Xa level in 6 hours  Christoper Fabian, PharmD, BCPS Please see amion for complete clinical pharmacist phone list 10/18/2022 11:27 PM

## 2022-10-18 NOTE — Progress Notes (Signed)
Bilateral lower extremity venous duplex has been completed. Preliminary results can be found in CV Proc through chart review.   10/18/22 4:50 PM Olen Cordial RVT

## 2022-10-19 ENCOUNTER — Other Ambulatory Visit (HOSPITAL_COMMUNITY): Payer: Self-pay

## 2022-10-19 ENCOUNTER — Encounter (HOSPITAL_COMMUNITY): Payer: Self-pay | Admitting: Psychiatry

## 2022-10-19 ENCOUNTER — Other Ambulatory Visit: Payer: Self-pay

## 2022-10-19 ENCOUNTER — Inpatient Hospital Stay (HOSPITAL_COMMUNITY): Payer: 59

## 2022-10-19 ENCOUNTER — Inpatient Hospital Stay (HOSPITAL_COMMUNITY)
Admission: AD | Admit: 2022-10-19 | Discharge: 2022-10-24 | DRG: 885 | Disposition: A | Discharge status: Home / Self Care | Payer: 59 | Source: Intra-hospital | Attending: Psychiatry | Admitting: Psychiatry

## 2022-10-19 ENCOUNTER — Other Ambulatory Visit: Payer: 59

## 2022-10-19 DIAGNOSIS — K5900 Constipation, unspecified: Secondary | ICD-10-CM | POA: Diagnosis present

## 2022-10-19 DIAGNOSIS — Z79899 Other long term (current) drug therapy: Secondary | ICD-10-CM

## 2022-10-19 DIAGNOSIS — R45851 Suicidal ideations: Secondary | ICD-10-CM

## 2022-10-19 DIAGNOSIS — F1410 Cocaine abuse, uncomplicated: Secondary | ICD-10-CM | POA: Diagnosis present

## 2022-10-19 DIAGNOSIS — I10 Essential (primary) hypertension: Secondary | ICD-10-CM | POA: Insufficient documentation

## 2022-10-19 DIAGNOSIS — F323 Major depressive disorder, single episode, severe with psychotic features: Principal | ICD-10-CM | POA: Diagnosis present

## 2022-10-19 DIAGNOSIS — Z5941 Food insecurity: Secondary | ICD-10-CM | POA: Diagnosis not present

## 2022-10-19 DIAGNOSIS — K219 Gastro-esophageal reflux disease without esophagitis: Secondary | ICD-10-CM | POA: Diagnosis present

## 2022-10-19 DIAGNOSIS — F17210 Nicotine dependence, cigarettes, uncomplicated: Secondary | ICD-10-CM | POA: Diagnosis present

## 2022-10-19 DIAGNOSIS — F315 Bipolar disorder, current episode depressed, severe, with psychotic features: Secondary | ICD-10-CM | POA: Diagnosis not present

## 2022-10-19 DIAGNOSIS — Z765 Malingerer [conscious simulation]: Secondary | ICD-10-CM

## 2022-10-19 DIAGNOSIS — I2782 Chronic pulmonary embolism: Secondary | ICD-10-CM | POA: Diagnosis present

## 2022-10-19 DIAGNOSIS — Z91199 Patient's noncompliance with other medical treatment and regimen due to unspecified reason: Secondary | ICD-10-CM | POA: Diagnosis not present

## 2022-10-19 DIAGNOSIS — R45850 Homicidal ideations: Secondary | ICD-10-CM | POA: Diagnosis present

## 2022-10-19 DIAGNOSIS — F319 Bipolar disorder, unspecified: Secondary | ICD-10-CM | POA: Diagnosis not present

## 2022-10-19 DIAGNOSIS — Z9151 Personal history of suicidal behavior: Secondary | ICD-10-CM | POA: Diagnosis not present

## 2022-10-19 DIAGNOSIS — Z5902 Unsheltered homelessness: Secondary | ICD-10-CM

## 2022-10-19 DIAGNOSIS — Z5982 Transportation insecurity: Secondary | ICD-10-CM | POA: Diagnosis not present

## 2022-10-19 DIAGNOSIS — F1090 Alcohol use, unspecified, uncomplicated: Secondary | ICD-10-CM | POA: Diagnosis present

## 2022-10-19 DIAGNOSIS — G629 Polyneuropathy, unspecified: Secondary | ICD-10-CM | POA: Diagnosis present

## 2022-10-19 DIAGNOSIS — I2699 Other pulmonary embolism without acute cor pulmonale: Secondary | ICD-10-CM | POA: Diagnosis not present

## 2022-10-19 DIAGNOSIS — I2510 Atherosclerotic heart disease of native coronary artery without angina pectoris: Secondary | ICD-10-CM | POA: Diagnosis present

## 2022-10-19 DIAGNOSIS — F333 Major depressive disorder, recurrent, severe with psychotic symptoms: Principal | ICD-10-CM

## 2022-10-19 DIAGNOSIS — E559 Vitamin D deficiency, unspecified: Secondary | ICD-10-CM | POA: Diagnosis present

## 2022-10-19 DIAGNOSIS — Z5900 Homelessness unspecified: Secondary | ICD-10-CM

## 2022-10-19 DIAGNOSIS — Z789 Other specified health status: Secondary | ICD-10-CM | POA: Diagnosis present

## 2022-10-19 LAB — HEPARIN LEVEL (UNFRACTIONATED): Heparin Unfractionated: 0.79 IU/mL — ABNORMAL HIGH (ref 0.30–0.70)

## 2022-10-19 LAB — CBC
HCT: 39.3 % (ref 39.0–52.0)
Hemoglobin: 13 g/dL (ref 13.0–17.0)
MCH: 31.3 pg (ref 26.0–34.0)
MCHC: 33.1 g/dL (ref 30.0–36.0)
MCV: 94.7 fL (ref 80.0–100.0)
Platelets: 308 10*3/uL (ref 150–400)
RBC: 4.15 MIL/uL — ABNORMAL LOW (ref 4.22–5.81)
RDW: 12.8 % (ref 11.5–15.5)
WBC: 7.8 10*3/uL (ref 4.0–10.5)
nRBC: 0 % (ref 0.0–0.2)

## 2022-10-19 LAB — PROTEIN S, TOTAL: Protein S Ag, Total: 136 % (ref 60–150)

## 2022-10-19 LAB — VALPROIC ACID LEVEL: Valproic Acid Lvl: 62 ug/mL (ref 50.0–100.0)

## 2022-10-19 LAB — BETA-2-GLYCOPROTEIN I ABS, IGG/M/A
Beta-2 Glyco I IgG: <9 GPI IgG units (ref 0–20)
Beta-2-Glycoprotein I IgA: <9 GPI IgA units (ref 0–25)
Beta-2-Glycoprotein I IgM: <9 GPI IgM units (ref 0–32)

## 2022-10-19 LAB — HOMOCYSTEINE: Homocysteine: 14.7 umol/L — ABNORMAL HIGH (ref 0.0–14.5)

## 2022-10-19 LAB — LUPUS ANTICOAGULANT PANEL
DRVVT: 41.8 s (ref 0.0–47.0)
PTT Lupus Anticoagulant: 32 s (ref 0.0–43.5)

## 2022-10-19 LAB — PROTEIN S ACTIVITY: Protein S Activity: 125 % (ref 63–140)

## 2022-10-19 LAB — PROTEIN C ACTIVITY: Protein C Activity: 131 % (ref 73–180)

## 2022-10-19 MED ORDER — TRAZODONE HCL 100 MG PO TABS
100.0000 mg | ORAL_TABLET | Freq: Every evening | ORAL | Status: DC | PRN
  Administered 2022-10-19 – 2022-10-23 (×3): 100 mg via ORAL
  Filled 2022-10-19 (×4): qty 1

## 2022-10-19 MED ORDER — APIXABAN 5 MG PO TABS
10.0000 mg | ORAL_TABLET | Freq: Two times a day (BID) | ORAL | Status: DC
  Administered 2022-10-19 – 2022-10-24 (×10): 10 mg via ORAL
  Filled 2022-10-19 (×14): qty 2

## 2022-10-19 MED ORDER — AMLODIPINE BESYLATE 10 MG PO TABS
10.0000 mg | ORAL_TABLET | Freq: Every day | ORAL | Status: DC
  Administered 2022-10-20 – 2022-10-24 (×5): 10 mg via ORAL
  Filled 2022-10-19 (×2): qty 1
  Filled 2022-10-19: qty 2
  Filled 2022-10-19 (×4): qty 1

## 2022-10-19 MED ORDER — SENNA 8.6 MG PO TABS
1.0000 | ORAL_TABLET | Freq: Two times a day (BID) | ORAL | Status: DC
  Administered 2022-10-19 – 2022-10-24 (×9): 8.6 mg via ORAL
  Filled 2022-10-19 (×15): qty 1

## 2022-10-19 MED ORDER — APIXABAN 5 MG PO TABS: 5.0000 mg | ORAL_TABLET | Freq: Two times a day (BID) | ORAL | Status: DC

## 2022-10-19 MED ORDER — APIXABAN 5 MG PO TABS
10.0000 mg | ORAL_TABLET | Freq: Two times a day (BID) | ORAL | Status: DC
  Administered 2022-10-19: 10 mg via ORAL
  Filled 2022-10-19: qty 2

## 2022-10-19 MED ORDER — PANTOPRAZOLE SODIUM 40 MG PO TBEC
40.0000 mg | DELAYED_RELEASE_TABLET | Freq: Every day | ORAL | Status: DC
  Administered 2022-10-20 – 2022-10-24 (×5): 40 mg via ORAL
  Filled 2022-10-19 (×8): qty 1

## 2022-10-19 MED ORDER — HALOPERIDOL 5 MG PO TABS: 5.0000 mg | ORAL_TABLET | Freq: Three times a day (TID) | ORAL | Status: DC | PRN

## 2022-10-19 MED ORDER — APIXABAN (ELIQUIS) VTE STARTER PACK (10MG AND 5MG)
ORAL_TABLET | ORAL | 0 refills | Status: DC
  Filled 2022-10-19: qty 74, 30d supply, fill #0

## 2022-10-19 MED ORDER — DIPHENHYDRAMINE HCL 25 MG PO CAPS: 50.0000 mg | ORAL_CAPSULE | Freq: Three times a day (TID) | ORAL | Status: DC | PRN

## 2022-10-19 MED ORDER — ARIPIPRAZOLE 10 MG PO TABS
10.0000 mg | ORAL_TABLET | Freq: Every day | ORAL | Status: DC
  Administered 2022-10-20 – 2022-10-24 (×5): 10 mg via ORAL
  Filled 2022-10-19 (×7): qty 1

## 2022-10-19 MED ORDER — DIVALPROEX SODIUM 500 MG PO DR TAB
500.0000 mg | DELAYED_RELEASE_TABLET | Freq: Two times a day (BID) | ORAL | Status: DC
  Administered 2022-10-19 – 2022-10-24 (×9): 500 mg via ORAL
  Filled 2022-10-19 (×14): qty 1

## 2022-10-19 MED ORDER — VITAMIN D3 25 MCG PO TABS
5000.0000 [IU] | ORAL_TABLET | Freq: Every day | ORAL | Status: DC
  Administered 2022-10-20 – 2022-10-24 (×5): 5000 [IU] via ORAL
  Filled 2022-10-19 (×7): qty 5

## 2022-10-19 MED ORDER — HALOPERIDOL LACTATE 5 MG/ML IJ SOLN: 5.0000 mg | Freq: Three times a day (TID) | INTRAMUSCULAR | Status: DC | PRN

## 2022-10-19 MED ORDER — LORAZEPAM 1 MG PO TABS: 2.0000 mg | ORAL_TABLET | Freq: Three times a day (TID) | ORAL | Status: DC | PRN

## 2022-10-19 MED ORDER — LORAZEPAM 2 MG/ML IJ SOLN: 2.0000 mg | Freq: Three times a day (TID) | INTRAMUSCULAR | Status: DC | PRN

## 2022-10-19 MED ORDER — GABAPENTIN 300 MG PO CAPS
300.0000 mg | ORAL_CAPSULE | Freq: Every day | ORAL | Status: DC
  Administered 2022-10-19 – 2022-10-23 (×5): 300 mg via ORAL
  Filled 2022-10-19 (×7): qty 1

## 2022-10-19 MED ORDER — DIPHENHYDRAMINE HCL 50 MG/ML IJ SOLN: 50.0000 mg | Freq: Three times a day (TID) | INTRAMUSCULAR | Status: DC | PRN

## 2022-10-19 NOTE — Progress Notes (Signed)
Came bedside for echo, but patient wants to eat at this time instead of getting testing.

## 2022-10-19 NOTE — Progress Notes (Signed)
RN called and gave report to [facility] at [facility]. [employer] transporting patient. RN gave discharge paperwork and medication to transport.

## 2022-10-19 NOTE — Hospital Course (Signed)
Charles Estes is a 62 y.o. M with homelessness, polysubstance use including cocaine, HTN, Bipolar disorder and recent hospitalization who presented with PE.    5/17: Presented with chest pain, CTA showed multifocal probably subacute PE, admitted on heparin 5/18: Seen by psychiatry for positive nursing screen for self-harm, violent thoughts --> switched to Eliquis, medically cleared and IVC'd

## 2022-10-19 NOTE — TOC Transition Note (Signed)
DISCHARGE MEDS FROM TOC LOCKED UP AT INPATIENT PHARMACY 

## 2022-10-19 NOTE — TOC Transition Note (Signed)
Transition of Care [facility]) - CM/SW Discharge Note   Patient Details  Name: Charles Estes MRN: [id number] Date of Birth: 11/22/1960  [facility] CM/SW Contact:  Deatra Robinson, [place] Phone Number: 10/19/2022, 4:17 PM   Clinical Narrative: Pt has been accepted to [facility], room 504-02. RN provided with number for reprort and [employer] called for transport. IVC remains in place, paperwork on chart. SW signing off at dc.   Dellie Burns, MSW, LCSW [phone] (coverage)      Final next level of care: Psychiatric Hospital Barriers to Discharge: No Barriers Identified   Patient Goals and CMS Choice      Discharge Placement                  Patient to be transferred to facility by: [facility]      Discharge Plan and Services Additional resources added to the After Visit Summary for                                       Social Determinants of Health (SDOH) Interventions SDOH Screenings   Food Insecurity: Food Insecurity Present (08/26/2022)  Housing: Medium Risk (08/26/2022)  Transportation Needs: Unmet Transportation Needs (08/26/2022)  Utilities: At Risk (08/26/2022)  Alcohol Screen: Low Risk  (08/26/2022)  Depression (PHQ2-9): Medium Risk (08/24/2022)  Tobacco Use: Low Risk  (10/18/2022)     Readmission Risk Interventions     No data to display

## 2022-10-19 NOTE — Progress Notes (Signed)
IVC completed in ED 10/18/22, no documented case number for IVC efile and serve. Completed IVC paperwork and custody order on chart. [facility] reviewing for admission.   Dellie Burns, MSW, LCSW [phone] (coverage)

## 2022-10-19 NOTE — Discharge Summary (Addendum)
Physician Discharge Summary   Patient: Charles Estes MRN: [id number] DOB: [birth year]  Admit date:     10/18/2022  Discharge date: 10/19/22  Discharge Physician: Alberteen Sam   PCP: Michell Heinrich, MD     Recommendations at discharge:  Continue Eliquis 10 BID for 7 days --> On May 25, reduce to 5 mg BID for 6 months Follow up with PCP Dr. Erling Conte in 1 week after [facility] discharge for blood clot Dr. Erling Conte:  Please follow up hypercoagulable panel from the ER     Discharge Diagnoses: Principal Problem:   Pulmonary embolism (HCC) Active Problems:   Uncontrolled hypertension   Bipolar disorder, curr episode depressed, severe, w/psychotic features (HCC)   Generalized anxiety disorder with panic attacks   Gastroesophageal reflux disease   Suicidal thoughts      Hospital Course: [name] is a 62 y.o. M with homelessness, polysubstance use including cocaine, HTN, Bipolar disorder and recent hospitalization who presented with PE.    5/17: Presented with chest pain, CTA showed multifocal probably subacute PE, admitted on heparin 5/18: Seen by psychiatry for positive nursing screen for self-harm, violent thoughts --> switched to Eliquis, medically cleared and IVC'd     * Pulmonary embolism (HCC) CTA showed multifocal PE, by appearance these are subacute.  RV slightly > LV, but trops normal, hemodynamics totally normal.  Echo ordered but patient refused, and given it will add little to management and his threats of violence, I will defer.  LE dopplers normal.   Heparin transitioned to Eliquis:  - Continue Eliquis 10 BID until next Fri May 24 then Sat May 25 reduce to 5 BID - Continue Eliquis for 6 months - Will need follow up outpatient for hypercoagulable work up    Uncontrolled hypertension On amlodipine  Bipolar disorder, curr episode depressed, severe, w/psychotic features (HCC) Admitted to thoughts of self harm, reported having a gun at home and "no  telling what I'll do with it".  Unable to contract for safety, unable to state whether he is adherent to home medications.   Resumed home Abilify, Depakote, gabapentin, Trazodone  Psychiatry were consulted who recommended inpatient hospitalization.             The [facility] Controlled Substances Registry was reviewed for this patient prior to discharge.   Consultants: Psychiatry Procedures performed:  CTA chest  LE Doppler  Disposition: Home Diet recommendation:  Regular diet  DISCHARGE MEDICATION: Allergies as of 10/19/2022       Reactions   Paxil [paroxetine] Diarrhea, Other (See Comments)   Mouth sores        Medication List     STOP taking these medications    aspirin EC 81 MG tablet       TAKE these medications    amLODipine 10 MG tablet Commonly known as: NORVASC Take 1 tablet (10 mg total) by mouth daily.   ARIPiprazole 10 MG tablet Commonly known as: ABILIFY Take 1 tablet (10 mg total) by mouth daily.   cholecalciferol 25 MCG (1000 UNIT) tablet Commonly known as: VITAMIN D3 Take 5 tablets (5,000 Units total) by mouth daily with breakfast.   divalproex 500 MG DR tablet Commonly known as: DEPAKOTE Take 1 tablet (500 mg total) by mouth every 12 (twelve) hours.   Eliquis DVT/PE Starter Pack Generic drug: Apixaban Starter Pack (10mg  and 5mg ) Take as directed on package: start with two-5mg  tablets twice daily for 7 days. On day 8, switch to one-5mg  tablet twice daily.   gabapentin  300 MG capsule Commonly known as: NEURONTIN Take 1 capsule (300 mg total) by mouth at bedtime.   pantoprazole 40 MG tablet Commonly known as: PROTONIX Take 1 tablet (40 mg total) by mouth daily.   traZODone 100 MG tablet Commonly known as: DESYREL Take 1 tablet (100 mg total) by mouth at bedtime as needed for sleep.         Discharge Instructions     Discharge instructions   Complete by: As directed    You were admitted for blood clots. You  were given Apixaban 1 month prescription before you left Take this 10 mg twice daily for the next week After one week reduce to 5 mg twice daily  Take for six months  Go see your primary doctor for refills   Discharge patient   Complete by: As directed    Discharge disposition: 65-Discharged/transferred to Psychiatric Hospital or Psychiatric Unit/Distinct Part of Hospital   Discharge patient date: 10/19/2022       Discharge Exam: Filed Weights   10/18/22 0908  Weight: 90.7 kg    General: Pt is alert, awake, not in acute distress Cardiovascular: RRR, nl S1-S2, no murmurs appreciated.   No LE edema.   Respiratory: Normal respiratory rate and rhythm.  CTAB without rales or wheezes. Abdominal: Abdomen soft and non-tender.  No distension or HSM.   Neuro/Psych: Strength symmetric in upper and lower extremities.  Judgment and insight appear normal.   Condition at discharge: good  The results of significant diagnostics from this hospitalization (including imaging, microbiology, ancillary and laboratory) are listed below for reference.   Imaging Studies: VAS [place] LOWER EXTREMITY VENOUS (DVT)  Result Date: 10/19/2022  Lower Venous DVT Study Patient Name:  Charles Estes  Date of Exam:   10/18/2022 Medical Rec #: [id number]    Accession #:    [id number] Date of Birth: Oct 22, 1960    Patient Gender: M Patient Age:   34 years Exam [facility] Procedure:      VAS [place] LOWER EXTREMITY VENOUS (DVT) Referring Phys: Joneen Roach --------------------------------------------------------------------------------  Indications: Pulmonary embolism.  Risk Factors: None identified. Anticoagulation: Heparin. Limitations: Patient positioning. Comparison Study: No prior studies. Performing Technologist: Chanda Busing RVT  Examination Guidelines: A complete evaluation includes B-mode imaging, spectral Doppler, color Doppler, and power Doppler as needed of all accessible portions of each vessel.  Bilateral testing is considered an integral part of a complete examination. Limited examinations for reoccurring indications may be performed as noted. The reflux portion of the exam is performed with the patient in reverse Trendelenburg.  +---------+---------------+---------+-----------+----------+--------------+ RIGHT    CompressibilityPhasicitySpontaneityPropertiesThrombus Aging +---------+---------------+---------+-----------+----------+--------------+ CFV      Full           Yes      Yes                                 +---------+---------------+---------+-----------+----------+--------------+ SFJ      Full                                                        +---------+---------------+---------+-----------+----------+--------------+ FV Prox  Full                                                        +---------+---------------+---------+-----------+----------+--------------+  FV Mid   Full                                                        +---------+---------------+---------+-----------+----------+--------------+ FV DistalFull                                                        +---------+---------------+---------+-----------+----------+--------------+ PFV      Full                                                        +---------+---------------+---------+-----------+----------+--------------+ POP      Full           Yes      Yes                                 +---------+---------------+---------+-----------+----------+--------------+ PTV      Full                                                        +---------+---------------+---------+-----------+----------+--------------+ PERO     Full                                                        +---------+---------------+---------+-----------+----------+--------------+   +---------+---------------+---------+-----------+----------+--------------+ LEFT      CompressibilityPhasicitySpontaneityPropertiesThrombus Aging +---------+---------------+---------+-----------+----------+--------------+ CFV      Full           Yes      Yes                                 +---------+---------------+---------+-----------+----------+--------------+ SFJ      Full                                                        +---------+---------------+---------+-----------+----------+--------------+ FV Prox  Full                                                        +---------+---------------+---------+-----------+----------+--------------+ FV Mid   Full                                                        +---------+---------------+---------+-----------+----------+--------------+  FV DistalFull                                                        +---------+---------------+---------+-----------+----------+--------------+ PFV      Full                                                        +---------+---------------+---------+-----------+----------+--------------+ POP      Full           Yes      Yes                                 +---------+---------------+---------+-----------+----------+--------------+ PTV      Full                                                        +---------+---------------+---------+-----------+----------+--------------+ PERO     Full                                                        +---------+---------------+---------+-----------+----------+--------------+     Summary: RIGHT: - There is no evidence of deep vein thrombosis in the lower extremity.  - No cystic structure found in the popliteal fossa.  LEFT: - There is no evidence of deep vein thrombosis in the lower extremity.  - No cystic structure found in the popliteal fossa.  *See table(s) above for measurements and observations. Electronically signed by Gerarda Fraction on 10/19/2022 at 8:50:47 AM.    Final    CT Angio Chest/Abd/Pel for  Dissection W and/or Wo Contrast  Result Date: 10/18/2022 CLINICAL DATA:  Acute aortic syndrome back pain while sleeping and truck left-sided chest pain extending under left arm. Nausea without vomiting. EXAM: CT ANGIOGRAPHY CHEST, ABDOMEN AND PELVIS TECHNIQUE: Non-contrast CT of the chest was initially obtained. Multidetector CT imaging through the chest, abdomen and pelvis was performed using the standard protocol during bolus administration of intravenous contrast. Multiplanar reconstructed images and MIPs were obtained and reviewed to evaluate the vascular anatomy. RADIATION DOSE REDUCTION: This exam was performed according to the departmental dose-optimization program which includes automated exposure control, adjustment of the mA and/or kV according to patient size and/or use of iterative reconstruction technique. CONTRAST:  75 mL OMNIPAQUE IOHEXOL 350 MG/ML SOLN COMPARISON:  01/22/2022 FINDINGS: CTA CHEST FINDINGS Cardiovascular: No aortic intramural hematoma identified on precontrast images. Mildly dilated ascending thoracic aorta measuring up to 4.1 cm. Extensive coronary artery calcifications. Heart size is within normal limits Nonocclusive eccentric pulmonary emboli seen within right upper lobe segmental branches, best seen on images 54, 56, 59 of series 7. Additional nonocclusive eccentric thrombus also noted within the posterior basilar branch of the right lower lobe pulmonary artery, image 77 of series 7. Nonocclusive eccentric thrombus seen in the posterior basilar  branch of the left lower lobe (image 78, series 7). RV/LV ratio measures 1.2. Mediastinum/Nodes: No enlarged axillary, hilar, or mediastinal lymph nodes. Thyroid is unremarkable. No significant abnormality of the esophagus. Lungs/Pleura: Mild bibasilar atelectasis. No focal airspace opacity to indicate pneumonia or pulmonary infarction. Musculoskeletal: No chest wall abnormality. No acute or significant osseous findings. Review of the MIP  images confirms the above findings. CTA ABDOMEN AND PELVIS FINDINGS VASCULAR Aorta: Mild scattered atheromatous calcified plaque without aneurysm or flow-limiting stenosis. No dissection. Celiac: Patent without evidence of aneurysm, dissection, vasculitis or significant stenosis. SMA: Patent without evidence of aneurysm, dissection, vasculitis or significant stenosis. Renals: No significant stenosis of main renal arteries. At least moderate stenosis at the origin of accessory left lower pole renal artery. IMA: Patent without evidence of aneurysm, dissection, vasculitis or significant stenosis. Inflow: Patent without evidence of aneurysm, dissection, vasculitis or significant stenosis. Veins: No obvious venous abnormality within the limitations of this arterial phase study. Review of the MIP images confirms the above findings. NON-VASCULAR Hepatobiliary: No focal liver abnormality is seen. No gallstones, gallbladder wall thickening, or biliary dilatation. Pancreas: Unremarkable. No pancreatic ductal dilatation or surrounding inflammatory changes. Spleen: Punctate splenic calcifications likely result of prior granulomatous inflammation. Spleen otherwise normal. Adrenals/Urinary Tract: Adrenal glands are unremarkable. 7 mm hypodense structure in the posterior cortex of the mid left kidney is too small to fully characterize. This likely represents a small simple cyst and does not require dedicated imaging follow-up. Kidneys are otherwise normal, without renal calculi, focal lesion, or hydronephrosis. Bladder is unremarkable. Stomach/Bowel: Stomach is within normal limits. Appendix appears normal. No evidence of bowel wall thickening, distention, or inflammatory changes. Lymphatic: No enlarged abdominal or pelvic lymph nodes. Reproductive: Mild prostatomegaly. Other: No abdominal wall hernia or abnormality. No abdominopelvic ascites. Musculoskeletal: No acute or significant osseous findings. Review of the MIP images  confirms the above findings. IMPRESSION: 1. No aortic dissection or aneurysm. 2. Mildly dilated ascending thoracic aorta measuring up to 4.1 cm. 3. Nonocclusive eccentric pulmonary emboli seen within segmental branches of both lungs, favored to be chronic rather than acute. RV/LV ratio measures 1.2. 4. Extensive coronary artery calcifications. 5. No acute abnormality of the abdomen or pelvis. Electronically Signed   By: Acquanetta Belling M.D.   On: 10/18/2022 12:00   DG Chest Portable 1 View  Result Date: 10/18/2022 CLINICAL DATA:  Chest pain EXAM: PORTABLE CHEST 1 VIEW COMPARISON:  CXR 3 /22/24 FINDINGS: No pleural effusion. No pneumothorax normal cardiac and mediastinal contours. No focal airspace opacity. No radiographically apparent displaced rib fractures. Visualized upper abdomen is unremarkable. IMPRESSION: No focal airspace opacity Electronically Signed   By: Lorenza Cambridge M.D.   On: 10/18/2022 09:14    Microbiology: Results for orders placed or performed during the hospital encounter of 08/23/22  Resp panel by RT-PCR (RSV, Flu A&B, Covid) Anterior Nasal Swab     Status: None   Collection Time: 08/23/22  3:13 PM   Specimen: Anterior Nasal Swab  Result Value Ref Range Status   SARS Coronavirus 2 by RT PCR NEGATIVE NEGATIVE Final   Influenza A by PCR NEGATIVE NEGATIVE Final   Influenza B by PCR NEGATIVE NEGATIVE Final    Comment: (NOTE) The Xpert Xpress SARS-CoV-2/FLU/RSV plus assay is intended as an aid in the diagnosis of influenza from Nasopharyngeal swab specimens and should not be used as a sole basis for treatment. Nasal washings and aspirates are unacceptable for Xpert Xpress SARS-CoV-2/FLU/RSV testing.  Fact Sheet for Patients: BloggerCourse.com  Fact Sheet for Healthcare Providers: SeriousBroker.it  This test is not yet approved or cleared by the [place] FDA and has been authorized for detection and/or diagnosis of  SARS-CoV-2 by FDA under an Emergency Use Authorization (EUA). This EUA will remain in effect (meaning this test can be used) for the duration of the COVID-19 declaration under Section 564(b)(1) of the Act, 21 U.S.C. section 360bbb-3(b)(1), unless the authorization is terminated or revoked.     Resp Syncytial Virus by PCR NEGATIVE NEGATIVE Final    Comment: (NOTE) Fact Sheet for Patients: BloggerCourse.com  Fact Sheet for Healthcare Providers: SeriousBroker.it  This test is not yet approved or cleared by the [place] FDA and has been authorized for detection and/or diagnosis of SARS-CoV-2 by FDA under an Emergency Use Authorization (EUA). This EUA will remain in effect (meaning this test can be used) for the duration of the COVID-19 declaration under Section 564(b)(1) of the Act, 21 U.S.C. section 360bbb-3(b)(1), unless the authorization is terminated or revoked.  Performed at [facility], [address], [place], [place] [address]     Labs: CBC: Recent Labs  Lab 10/18/22 0854 10/19/22 1100  WBC 7.0 7.8  NEUTROABS 4.5  --   HGB 12.3* 13.0  HCT 35.9* 39.3  MCV 92.5 94.7  PLT 296 308   Basic Metabolic Panel: Recent Labs  Lab 10/18/22 0854  NA 137  K 3.7  CL 103  CO2 25  GLUCOSE 133*  BUN 12  CREATININE 1.17  CALCIUM 9.0   Liver Function Tests: Recent Labs  Lab 10/18/22 0854  AST 16  ALT 13  ALKPHOS 84  BILITOT 0.3  PROT 6.8  ALBUMIN 3.3*   CBG: No results for input(s): "GLUCAP" in the last 168 hours.  Discharge time spent: approximately 35 minutes spent on discharge counseling, evaluation of patient on day of discharge, and coordination of discharge planning with nursing, social work, pharmacy and case management  Signed: Alberteen Sam, MD [facility] 10/19/2022

## 2022-10-19 NOTE — Assessment & Plan Note (Signed)
BP controlled - Continue home amlodipine

## 2022-10-19 NOTE — Care Management CC44 (Signed)
Condition Code 44 Documentation Completed  Patient Details  Name: Reshad Perkowski MRN: [id number] Date of Birth: [birth year]   Condition Code 44 given:    Patient signature on Condition Code 44 notice:    Documentation of 2 MD's agreement:  Yes Code 44 added to claim:  Yes  Reviewed case with leadership on call Rolm Gala A. LCSW. Forms not completed with patient due to IVC, SI, HI.  Lawerance Sabal, RN 10/19/2022, 12:43 PM

## 2022-10-19 NOTE — Group Note (Signed)
Date:  10/19/2022 Time:  8:57 PM  Group Topic/Focus:  Wrap-Up Group:   The focus of this group is to help patients review their daily goal of treatment and discuss progress on daily workbooks.    Participation Level:  Did Not Attend  Scot Dock 10/19/2022, 8:57 PM
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
## 2022-10-19 NOTE — Progress Notes (Signed)
ANTICOAGULATION CONSULT NOTE  Pharmacy Consult for Heparin Indication: pulmonary embolus  Allergies  Allergen Reactions   Paxil [Paroxetine] Diarrhea and Other (See Comments)    Mouth sores    Patient Measurements: Height: 5\' 6"  (167.6 cm) Weight: 90.7 kg (200 lb) IBW/kg (Calculated) : 63.8 Heparin Dosing Weight: 83 kg  Vital Signs: Temp: 98.1 F (36.7 C) (05/18 0511) Temp Source: Oral (05/18 0511) BP: 108/87 (05/18 0511) Pulse Rate: 75 (05/18 0511)  Labs: Recent Labs    10/18/22 0854 10/18/22 1048 10/18/22 1435 10/18/22 2157 10/19/22 0731  HGB 12.3*  --   --   --   --   HCT 35.9*  --   --   --   --   PLT 296  --   --   --   --   APTT  --   --  29  --   --   LABPROT  --   --  13.3  --   --   INR  --   --  1.0  --   --   HEPARINUNFRC  --   --   --  0.74* 0.79*  CREATININE 1.17  --   --   --   --   TROPONINIHS 5 5  --   --   --      Estimated Creatinine Clearance: 71.7 mL/min (by C-G formula based on SCr of 1.17 mg/dL).   Assessment: 46 yom with a history of HTN, bipolar, chronic pain. Patient is presenting with chest pain. Patient is not on anticoagulation prior to arrival. Heparin per pharmacy consult placed for pulmonary embolus.   CTA PE w/ mildly dilated ascending thoracic aorta and nonocclusive PE RV/LV 1.2 -- readout of CT stating favored to be chronic rather than acute.  Heparin level remains slightly elevated (0.79) despite dose reduction to 1400 units/hr. No bleeding noted, no issues with drawing the level per RN.  Goal of Therapy:  Heparin level 0.3-0.7 units/ml Monitor platelets by anticoagulation protocol: Yes   Plan:  Decrease heparin infusion to 1300 units/hr Check anti-Xa level in 6 hours Monitor CBC, heparin level and s/s of bleeding daily  Rennis Petty, PharmD PGY1 Pharmacy Resident 10/19/2022 9:30 AM
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
## 2022-10-19 NOTE — Care Plan (Signed)
Hemodynamically stable, on room air, asymptomatic, refusing echo.  Will cancel echo (as it would not change management) and transition to apixaban.  Once on apixaban shortly, he is medically cleared for discharge to Psychiatry.

## 2022-10-19 NOTE — Plan of Care (Signed)
  Problem: Education: Goal: Mental status will improve Outcome: Progressing   Problem: Coping: Goal: Ability to demonstrate self-control will improve Outcome: Progressing   Problem: Health Behavior/Discharge Planning: Goal: Compliance with therapeutic regimen will improve Outcome: Progressing

## 2022-10-19 NOTE — Care Management Obs Status (Signed)
MEDICARE OBSERVATION STATUS NOTIFICATION   Patient Details  Name: Charles Estes MRN: [id number] Date of Birth: Jun 07, 1960   Medicare Observation Status Notification Given:  No   Reviewed case with leadership on call Rolm Gala A. LCSW. Forms not completed with patient due to IVC, SI, HI. Lawerance Sabal, RN 10/19/2022, 12:40 PM

## 2022-10-19 NOTE — Progress Notes (Signed)
   10/19/22 2049  Psych Admission Type (Psych Patients Only)  Admission Status Involuntary  Psychosocial Assessment  Patient Complaints Irritability  Eye Contact Fair  Facial Expression Animated  Affect Irritable  Speech Soft  Interaction Assertive  Motor Activity Slow  Appearance/Hygiene In hospital gown  Behavior Characteristics Cooperative;Irritable  Mood Irritable  Thought Process  Coherency WDL  Content WDL  Delusions None reported or observed  Perception WDL  Hallucination None reported or observed  Judgment Poor  Confusion None  Danger to Self  Current suicidal ideation? Denies  Agreement Not to Harm Self Yes  Description of Agreement Verbal  Danger to Others  Danger to Others None reported or observed  Danger to Others Abnormal  Harmful Behavior to others No threats or harm toward other people  Destructive Behavior No threats or harm toward property

## 2022-10-20 DIAGNOSIS — Z5900 Homelessness unspecified: Secondary | ICD-10-CM

## 2022-10-20 LAB — PROTEIN C, TOTAL: Protein C, Total: 132 % (ref 60–150)

## 2022-10-20 MED ORDER — CLONIDINE HCL 0.1 MG PO TABS
0.1000 mg | ORAL_TABLET | Freq: Once | ORAL | Status: AC
  Administered 2022-10-20: 0.1 mg via ORAL
  Filled 2022-10-20 (×2): qty 1
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
## 2022-10-20 NOTE — Progress Notes (Signed)
   10/20/22 0548  15 Minute Checks  Location Bedroom  Visual Appearance Calm  Behavior Sleeping  Sleep (Behavioral Health Patients Only)  Calculate sleep? (Click Yes once per 24 hr at 0600 safety check) Yes  Documented sleep last 24 hours 8.25

## 2022-10-20 NOTE — Progress Notes (Signed)
Patient irritable at the beginning of the shift during medication administration. Appeared to be upset because when his labs were drawn, he asked to be stuck in a different place on his arm than from where the lab person drew the blood samples. Patient cooperative and no other issues during the shift. Urine sample collected. Patient went to the cafeteria during meal time. Denies SI/HI/ Denies AVH.

## 2022-10-20 NOTE — Progress Notes (Signed)
   10/20/22 1117  Psych Admission Type (Psych Patients Only)  Admission Status Involuntary  Psychosocial Assessment  Patient Complaints Irritability  Eye Contact Fair  Facial Expression Animated  Affect Irritable  Speech Logical/coherent  Interaction Assertive  Motor Activity Slow  Appearance/Hygiene Improved  Behavior Characteristics Cooperative  Mood Irritable  Thought Process  Coherency WDL  Content WDL  Delusions None reported or observed  Perception WDL  Hallucination None reported or observed  Judgment Poor  Confusion None  Danger to Self  Current suicidal ideation? Denies  Agreement Not to Harm Self Yes  Description of Agreement Verbal  Danger to Others  Danger to Others None reported or observed  Danger to Others Abnormal  Harmful Behavior to others No threats or harm toward other people  Destructive Behavior No threats or harm toward property

## 2022-10-20 NOTE — [facility] Group Notes (Signed)
Adult Psychoeducational Group Note  Date:  10/20/2022 Time:  9:24 PM  Group Topic/Focus:  Wrap-Up Group:   The focus of this group is to help patients review their daily goal of treatment and discuss progress on daily workbooks.  Participation Level:  Active  Participation Quality:  Appropriate  Affect:  Appropriate  Cognitive:  Appropriate  Insight: Improving  Engagement in Group:  Engaged  Modes of Intervention:  Discussion  Additional Comments:  Pt attended the evening wrap-up group. Tech introduced the staff for the evening, reminded group of the evening schedule and reminded them to ask for anything they need. Pt reported feeling 5 out of 10 today, with 1 being the worst and 10 being the best.  The pt is currently aligned to their daily goal. Additionally, the pt reports needing to speak to their social worker. Pt subsequently explored their current concerns from the day. Pt reported they did not have any auditory hallucinations. Pt reported they did not have any visual hallucinations. Pt reported they are not currently having any thoughts of harming themselves or others. Pt reported having slept "good" the previous night. Lastly, pt indicated they are experiencing soreness in their wrist. Group ended with a reminder of when night medications will be dispensed and the rest of the evening schedule.   Osa Craver 10/20/2022, 9:24 PM

## 2022-10-20 NOTE — Progress Notes (Addendum)
   10/20/22 2030  Psych Admission Type (Psych Patients Only)  Admission Status Involuntary  Psychosocial Assessment  Patient Complaints Depression  Eye Contact Fair  Facial Expression Anxious  Affect Appropriate to circumstance  Speech Logical/coherent  Interaction Assertive  Motor Activity Slow  Appearance/Hygiene Unremarkable  Behavior Characteristics Cooperative;Appropriate to situation  Mood Depressed  Thought Process  Coherency WDL  Content WDL  Delusions None reported or observed  Perception WDL  Hallucination None reported or observed  Judgment Poor  Confusion None  Danger to Self  Current suicidal ideation? Denies  Agreement Not to Harm Self Yes  Description of Agreement verbal  Danger to Others  Danger to Others None reported or observed   Progress note   D: Pt seen in dayroom. Pt denies SI, HI, AVH. Contracts for safety. Pt rates pain  7/10 as chronic left hip pain. Says he is supposed to be connected with a pain clinic but he and the doctor "we couldn't get on the same page as far as what I was supposed to take." Pt also states he has "white coat syndrome." His blood pressure elevates when he has to see a doctor. Pt rates anxiety  0/10 and depression  7.5/10. Pt is cooperative. States he had a disagreement with the phlebotomist over where on his arm to draw blood. Pt has been feeling sluggish and was told that more blood work has been ordered for tomorrow morning. Pt agreeable. No other concerns noted at this time.  A: Pt provided support and encouragement. Pt given scheduled medication as prescribed. PRNs as appropriate. Q15 min checks for safety.   R: Pt safe on the unit. Will continue to monitor.
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
## 2022-10-20 NOTE — [facility] Group Notes (Signed)
Adult Psychoeducational Group Note  Date:  10/20/2022 Time:  10:42 AM  Group Topic/Focus:  Goals Group:   The focus of this group is to help patients establish daily goals to achieve during treatment and discuss how the patient can incorporate goal setting into their daily lives to aide in recovery. Orientation:   The focus of this group is to educate the patient on the purpose and policies of crisis stabilization and provide a format to answer questions about their admission.  The group details unit policies and expectations of patients while admitted.  Participation Level:  Did Not Attend  Participation Quality:    Affect:    Cognitive:    Insight:   Engagement in Group:    Modes of Intervention:    Additional Comments:    Sheran Lawless 10/20/2022, 10:42 AM

## 2022-10-21 ENCOUNTER — Encounter (HOSPITAL_COMMUNITY): Payer: Self-pay

## 2022-10-21 DIAGNOSIS — F333 Major depressive disorder, recurrent, severe with psychotic symptoms: Principal | ICD-10-CM

## 2022-10-21 DIAGNOSIS — F319 Bipolar disorder, unspecified: Secondary | ICD-10-CM

## 2022-10-21 LAB — RAPID URINE DRUG SCREEN, HOSP PERFORMED
Amphetamines: NOT DETECTED
Barbiturates: NOT DETECTED
Benzodiazepines: NOT DETECTED
Cocaine: POSITIVE — AB
Opiates: NOT DETECTED
Tetrahydrocannabinol: NOT DETECTED

## 2022-10-21 LAB — CARDIOLIPIN ANTIBODIES, IGG, IGM, IGA
Anticardiolipin IgA: <9 APL U/mL (ref 0–11)
Anticardiolipin IgG: <9 GPL U/mL (ref 0–14)
Anticardiolipin IgM: <9 MPL U/mL (ref 0–12)

## 2022-10-21 LAB — LIPID PANEL
Cholesterol: 185 mg/dL (ref 0–200)
HDL: 32 mg/dL — ABNORMAL LOW (ref >40)
LDL Cholesterol: 132 mg/dL — ABNORMAL HIGH (ref 0–99)
Total CHOL/HDL Ratio: 5.8 RATIO
Triglycerides: 103 mg/dL (ref <150)
VLDL: 21 mg/dL (ref 0–40)

## 2022-10-21 LAB — HEMOGLOBIN A1C
Hgb A1c MFr Bld: 6 % — ABNORMAL HIGH (ref 4.8–5.6)
Mean Plasma Glucose: 125.5 mg/dL

## 2022-10-21 LAB — TSH: TSH: 1.41 u[IU]/mL (ref 0.350–4.500)

## 2022-10-21 MED ORDER — CLONIDINE HCL 0.1 MG PO TABS
0.1000 mg | ORAL_TABLET | Freq: Four times a day (QID) | ORAL | Status: DC | PRN
  Administered 2022-10-21 – 2022-10-22 (×4): 0.1 mg via ORAL
  Filled 2022-10-21 (×4): qty 1

## 2022-10-21 NOTE — Plan of Care (Signed)
  Problem: Health Behavior/Discharge Planning: Goal: Identification of resources available to assist in meeting health care needs will improve Outcome: Progressing   Problem: Safety: Goal: Periods of time without injury will increase Outcome: Progressing   

## 2022-10-21 NOTE — BH IP Treatment Plan (Signed)
Interdisciplinary Treatment and Diagnostic Plan Update  10/21/2022 Time of Session: 10:30am Charles Estes MRN: [id number]  Principal Diagnosis: Severe recurrent major depressive disorder with psychotic features [facility])  Secondary Diagnoses: Principal Problem:   Severe recurrent major depressive disorder with psychotic features (HCC) Active Problems:   Cocaine use disorder (HCC)   Alcohol use   Gastroesophageal reflux disease   Vitamin D deficiency   Essential hypertension   Pulmonary embolism (HCC)   Suicidal thoughts   Homelessness   Current Medications:  Current Facility-Administered Medications  Medication Dose Route Frequency Provider Last Rate Last Admin   amLODipine (NORVASC) tablet 10 mg  10 mg Oral Daily White, Patrice L, NP   10 mg at 10/21/22 7829   apixaban (ELIQUIS) tablet 10 mg  10 mg Oral BID White, Patrice L, NP   10 mg at 10/21/22 5621   Followed by   Melene Muller ON 10/26/2022] apixaban (ELIQUIS) tablet 5 mg  5 mg Oral BID White, Patrice L, NP       ARIPiprazole (ABILIFY) tablet 10 mg  10 mg Oral Daily White, Patrice L, NP   10 mg at 10/21/22 3086   cloNIDine (CATAPRES) tablet 0.1 mg  0.1 mg Oral Q6H PRN Abbott Pao, Nadir, MD   0.1 mg at 10/21/22 5784   diphenhydrAMINE (BENADRYL) capsule 50 mg  50 mg Oral TID PRN White, Patrice L, NP       Or   diphenhydrAMINE (BENADRYL) injection 50 mg  50 mg Intramuscular TID PRN White, Patrice L, NP       divalproex (DEPAKOTE) DR tablet 500 mg  500 mg Oral Q12H White, Patrice L, NP   500 mg at 10/20/22 2036   gabapentin (NEURONTIN) capsule 300 mg  300 mg Oral QHS White, Patrice L, NP   300 mg at 10/20/22 2037   haloperidol (HALDOL) tablet 5 mg  5 mg Oral TID PRN White, Patrice L, NP       Or   haloperidol lactate (HALDOL) injection 5 mg  5 mg Intramuscular TID PRN White, Patrice L, NP       LORazepam (ATIVAN) tablet 2 mg  2 mg Oral TID PRN White, Patrice L, NP       Or   LORazepam (ATIVAN) injection 2 mg  2 mg Intramuscular TID PRN White,  Patrice L, NP       pantoprazole (PROTONIX) EC tablet 40 mg  40 mg Oral Daily White, Patrice L, NP   40 mg at 10/21/22 6962   senna (SENOKOT) tablet 8.6 mg  1 tablet Oral BID White, Patrice L, NP   8.6 mg at 10/20/22 1731   traZODone (DESYREL) tablet 100 mg  100 mg Oral QHS PRN White, Patrice L, NP   100 mg at 10/20/22 2037   vitamin D3 (CHOLECALCIFEROL) tablet 5,000 Units  5,000 Units Oral Q breakfast White, Patrice L, NP   5,000 Units at 10/21/22 9528   PTA Medications: Medications Prior to Admission  Medication Sig Dispense Refill Last Dose   amLODipine (NORVASC) 10 MG tablet Take 1 tablet (10 mg total) by mouth daily. 30 tablet 0    APIXABAN (ELIQUIS) VTE STARTER PACK (10MG  AND 5MG ) Take as directed on package: start with two-5mg  tablets twice daily for 7 days. On day 8, switch to one-5mg  tablet twice daily. 74 each 0    ARIPiprazole (ABILIFY) 10 MG tablet Take 1 tablet (10 mg total) by mouth daily. 30 tablet 0    cholecalciferol (VITAMIN D3) 25 MCG (1000 UNIT)  tablet Take 5 tablets (5,000 Units total) by mouth daily with breakfast.      divalproex (DEPAKOTE) 500 MG DR tablet Take 1 tablet (500 mg total) by mouth every 12 (twelve) hours. 60 tablet 0    gabapentin (NEURONTIN) 300 MG capsule Take 1 capsule (300 mg total) by mouth at bedtime. 30 capsule 0    pantoprazole (PROTONIX) 40 MG tablet Take 1 tablet (40 mg total) by mouth daily. 30 tablet 0    traZODone (DESYREL) 100 MG tablet Take 1 tablet (100 mg total) by mouth at bedtime as needed for sleep. 30 tablet 0     Patient Stressors:    Patient Strengths:    Treatment Modalities: Medication Management, Group therapy, Case management,  1 to 1 session with clinician, Psychoeducation, Recreational therapy.   Physician Treatment Plan for Primary Diagnosis: Severe recurrent major depressive disorder with psychotic features (HCC) Long Term Goal(s): Improvement in symptoms so as ready for discharge   Short Term Goals: Ability to identify  changes in lifestyle to reduce recurrence of condition will improve Ability to verbalize feelings will improve Ability to disclose and discuss suicidal ideas Ability to demonstrate self-control will improve Ability to identify and develop effective coping behaviors will improve Ability to maintain clinical measurements within normal limits will improve Compliance with prescribed medications will improve Ability to identify triggers associated with substance abuse/mental health issues will improve  Medication Management: Evaluate patient's response, side effects, and tolerance of medication regimen.  Therapeutic Interventions: 1 to 1 sessions, Unit Group sessions and Medication administration.  Evaluation of Outcomes: Progressing  Physician Treatment Plan for Secondary Diagnosis: Principal Problem:   Severe recurrent major depressive disorder with psychotic features (HCC) Active Problems:   Cocaine use disorder (HCC)   Alcohol use   Gastroesophageal reflux disease   Vitamin D deficiency   Essential hypertension   Pulmonary embolism (HCC)   Suicidal thoughts   Homelessness  Long Term Goal(s): Improvement in symptoms so as ready for discharge   Short Term Goals: Ability to identify changes in lifestyle to reduce recurrence of condition will improve Ability to verbalize feelings will improve Ability to disclose and discuss suicidal ideas Ability to demonstrate self-control will improve Ability to identify and develop effective coping behaviors will improve Ability to maintain clinical measurements within normal limits will improve Compliance with prescribed medications will improve Ability to identify triggers associated with substance abuse/mental health issues will improve     Medication Management: Evaluate patient's response, side effects, and tolerance of medication regimen.  Therapeutic Interventions: 1 to 1 sessions, Unit Group sessions and Medication  administration.  Evaluation of Outcomes: Progressing   RN Treatment Plan for Primary Diagnosis: Severe recurrent major depressive disorder with psychotic features (HCC) Long Term Goal(s): Knowledge of disease and therapeutic regimen to maintain health will improve  Short Term Goals: Ability to remain free from injury will improve, Ability to verbalize frustration and anger appropriately will improve, Ability to demonstrate self-control, Ability to participate in decision making will improve, Ability to verbalize feelings will improve, Ability to disclose and discuss suicidal ideas, Ability to identify and develop effective coping behaviors will improve, and Compliance with prescribed medications will improve  Medication Management: RN will administer medications as ordered by provider, will assess and evaluate patient's response and provide education to patient for prescribed medication. RN will report any adverse and/or side effects to prescribing provider.  Therapeutic Interventions: 1 on 1 counseling sessions, Psychoeducation, Medication administration, Evaluate responses to treatment, Monitor vital signs and  CBGs as ordered, Perform/monitor CIWA, COWS, AIMS and Fall Risk screenings as ordered, Perform wound care treatments as ordered.  Evaluation of Outcomes: Progressing   LCSW Treatment Plan for Primary Diagnosis: Severe recurrent major depressive disorder with psychotic features (HCC) Long Term Goal(s): Safe transition to appropriate next level of care at discharge, Engage patient in therapeutic group addressing interpersonal concerns.  Short Term Goals: Engage patient in aftercare planning with referrals and resources, Increase social support, Increase ability to appropriately verbalize feelings, Increase emotional regulation, Facilitate acceptance of mental health diagnosis and concerns, Facilitate patient progression through stages of change regarding substance use diagnoses and  concerns, Identify triggers associated with mental health/substance abuse issues, and Increase skills for wellness and recovery  Therapeutic Interventions: Assess for all discharge needs, 1 to 1 time with Social worker, Explore available resources and support systems, Assess for adequacy in community support network, Educate family and significant other(s) on suicide prevention, Complete Psychosocial Assessment, Interpersonal group therapy.  Evaluation of Outcomes: Progressing   Progress in Treatment: Attending groups: No. Participating in groups: No. Taking medication as prescribed: Yes. Toleration medication: Yes. Family/Significant other contact made: No, will contact:  Ananias Pilgrim, brother  Patient understands diagnosis: No. Discussing patient identified problems/goals with staff: Yes. Medical problems stabilized or resolved: Yes. Denies suicidal/homicidal ideation: Yes. Issues/concerns per patient self-inventory: No.   New problem(s) identified: No, Describe:  none reported   New Short Term/Long Term Goal(s): detox, medication management for mood stabilization; elimination of SI thoughts; development of comprehensive mental wellness/sobriety plan    Patient Goals:  Pt states, "I want outpatient treatment and a halfway house"  Discharge Plan or Barriers:  Patient recently admitted. CSW will continue to follow and assess for appropriate referrals and possible discharge planning.    Reason for Continuation of Hospitalization: Anxiety Depression Medication stabilization Suicidal ideation Withdrawal symptoms  Estimated Length of Stay: 3-5 days  Last 3 [place] Suicide Severity Risk Score: Flowsheet Row [facility]-Admission (Discharged) from 10/18/2022 in [facility] (Discharged) [facility]  C-SSRS RISK CATEGORY  High Risk No Risk Moderate Risk       Last PHQ 2/9 Scores:    08/24/2022    6:38 AM 08/21/2022    9:21 AM  Depression screen PHQ 2/9  Decreased Interest 1 0  Down, Depressed, Hopeless 1 3  PHQ - 2 Score 2 3  Altered sleeping 1 3  Tired, decreased energy 1 3  Change in appetite 1 1  Feeling bad or failure about yourself  1 3  Trouble concentrating 1 2  Moving slowly or fidgety/restless 1 3  Suicidal thoughts 1 2  PHQ-9 Score 9 20  Difficult doing work/chores Somewhat difficult     Scribe for Treatment Team: Beatris Si, LCSW 10/21/2022 2:25 PM

## 2022-10-21 NOTE — Progress Notes (Signed)
[facility] MD Progress Note  10/21/2022 11:03 AM Charles Estes  MRN:  [id number]  Principal Problem: Severe recurrent major depressive disorder with psychotic features (HCC) Diagnosis: Principal Problem:   Severe recurrent major depressive disorder with psychotic features (HCC) Active Problems:   Cocaine use disorder (HCC)   Alcohol use   Gastroesophageal reflux disease   Vitamin D deficiency   Essential hypertension   Pulmonary embolism (HCC)   Suicidal thoughts   Homelessness    Reason for Admission:  Charles Estes is a 62 year old male with a past psychiatric history of bipolar disorder, stimulant use disorder, GAD with panic attacks, cannabis use disorder, and alcohol use disorder who presented under IVC from [facility] for suicidal and homicidal ideation.    Information obtained from 24-hour nursing report: The patient was noted to be cooperative.   Information Obtained Today During Patient Interview:  Today the patient reports he has no interest in going to a residential rehab.  He says that people talk down to him when he is there he does not find their programming productive.  He is interested in an [facility] when this is described.  He says that he does not know much about [facility].  Messaged LCSW to have them provide him with [facility] resources, so that he can begin making phone calls.  He does not appear psychotic but his affect is somewhat depressed.  He reports appropriate sleep, albeit with odd dreams, which may be due to trazodone.  He denies experiencing suicidal or homicidal thoughts.  He reports that he was upset in the emergency department and made the statements but had, and does have, no intention of harming another person or himself.  He denies experiencing medication side effects.  Total Time spent with patient: 20 min  Past Psychiatric History: as per H and P  Past Medical History:  Past Medical History:  Diagnosis Date   Depression    GERD  (gastroesophageal reflux disease)    Hypertension    Pathologic ulnar fracture with malunion    right    Past Surgical History:  Procedure Laterality Date   MANDIBLE FRACTURE SURGERY  2014   NO PAST SURGERIES     ORIF ULNAR FRACTURE Right 07/16/2017   Procedure: OPEN REDUCTION INTERNAL FIXATION (ORIF) RIGHT ULNA FRACTURE NONUNION;  Surgeon: Tarry Kos, MD;  Location: [facility];  Service: Orthopedics;  Laterality: Right;   Family History:  Family History  Problem Relation Age of Onset   Hypertension Mother    Heart attack Mother    Hypertension Father    Heart attack Father    Heart attack Sister    Hypertension Sister    Heart attack Sister    Hypertension Brother    Family Psychiatric  History: as per H and P Social History:  Social History   Substance and Sexual Activity  Alcohol Use Yes   Comment: socially- past weekend reports 10 beers     Social History   Substance and Sexual Activity  Drug Use Yes   Types: Cocaine   Comment: occasionally    Social History   Socioeconomic History   Marital status: Divorced    Spouse name: Not on file   Number of children: Not on file   Years of education: 14   Highest education level: Associate degree: occupational, Scientist, product/process development, or vocational program  Occupational History   Not on file  Tobacco Use   Smoking status: Never   Smokeless  tobacco: Never  Substance and Sexual Activity   Alcohol use: Yes    Comment: socially- past weekend reports 10 beers   Drug use: Yes    Types: Cocaine    Comment: occasionally   Sexual activity: Yes    Birth control/protection: None  Other Topics Concern   Not on file  Social History Narrative   Not on file   Social Determinants of Health   Financial Resource Strain: Not on file  Food Insecurity: Food Insecurity Present (10/19/2022)   Hunger Vital Sign    Worried About Running Out of Food in the Last Year: Sometimes true    Ran Out of Food in the Last Year:  Sometimes true  Transportation Needs: No Transportation Needs (10/19/2022)   PRAPARE - Administrator, Civil Service (Medical): No    Lack of Transportation (Non-Medical): No  Recent Concern: Transportation Needs - Unmet Transportation Needs (08/26/2022)   PRAPARE - Administrator, Civil Service (Medical): Yes    Lack of Transportation (Non-Medical): Yes  Physical Activity: Not on file  Stress: Not on file  Social Connections: Not on file   Additional Social History:                         Sleep: fair  Appetite: fair  Current Medications: Current Facility-Administered Medications  Medication Dose Route Frequency Provider Last Rate Last Admin   amLODipine (NORVASC) tablet 10 mg  10 mg Oral Daily White, Patrice L, NP   10 mg at 10/21/22 1610   apixaban (ELIQUIS) tablet 10 mg  10 mg Oral BID White, Patrice L, NP   10 mg at 10/21/22 9604   Followed by   Melene Muller ON 10/26/2022] apixaban (ELIQUIS) tablet 5 mg  5 mg Oral BID White, Patrice L, NP       ARIPiprazole (ABILIFY) tablet 10 mg  10 mg Oral Daily White, Patrice L, NP   10 mg at 10/21/22 5409   cloNIDine (CATAPRES) tablet 0.1 mg  0.1 mg Oral Q6H PRN Sarita Bottom, MD   0.1 mg at 10/21/22 8119   diphenhydrAMINE (BENADRYL) capsule 50 mg  50 mg Oral TID PRN White, Patrice L, NP       Or   diphenhydrAMINE (BENADRYL) injection 50 mg  50 mg Intramuscular TID PRN White, Patrice L, NP       divalproex (DEPAKOTE) DR tablet 500 mg  500 mg Oral Q12H White, Patrice L, NP   500 mg at 10/20/22 2036   gabapentin (NEURONTIN) capsule 300 mg  300 mg Oral QHS White, Patrice L, NP   300 mg at 10/20/22 2037   haloperidol (HALDOL) tablet 5 mg  5 mg Oral TID PRN White, Patrice L, NP       Or   haloperidol lactate (HALDOL) injection 5 mg  5 mg Intramuscular TID PRN White, Patrice L, NP       LORazepam (ATIVAN) tablet 2 mg  2 mg Oral TID PRN White, Patrice L, NP       Or   LORazepam (ATIVAN) injection 2 mg  2 mg  Intramuscular TID PRN White, Patrice L, NP       pantoprazole (PROTONIX) EC tablet 40 mg  40 mg Oral Daily White, Patrice L, NP   40 mg at 10/21/22 0926   senna (SENOKOT) tablet 8.6 mg  1 tablet Oral BID White, Patrice L, NP   8.6 mg at 10/20/22 1731   traZODone (  DESYREL) tablet 100 mg  100 mg Oral QHS PRN White, Patrice L, NP   100 mg at 10/20/22 2037   vitamin D3 (CHOLECALCIFEROL) tablet 5,000 Units  5,000 Units Oral Q breakfast Liborio Nixon L, NP   5,000 Units at 10/21/22 1610    Lab Results:  Results for orders placed or performed during the hospital encounter of 10/19/22 (from the past 48 hour(s))  Rapid urine drug screen (hospital performed)     Status: Abnormal   Collection Time: 10/20/22 10:44 AM  Result Value Ref Range   Opiates NONE DETECTED NONE DETECTED   Cocaine POSITIVE (A) NONE DETECTED   Benzodiazepines NONE DETECTED NONE DETECTED   Amphetamines NONE DETECTED NONE DETECTED   Tetrahydrocannabinol NONE DETECTED NONE DETECTED   Barbiturates NONE DETECTED NONE DETECTED    Comment: (NOTE) DRUG SCREEN FOR MEDICAL PURPOSES ONLY.  IF CONFIRMATION IS NEEDED FOR ANY PURPOSE, NOTIFY LAB WITHIN 5 DAYS.  LOWEST DETECTABLE LIMITS FOR URINE DRUG SCREEN Drug Class                     Cutoff (ng/mL) Amphetamine and metabolites    1000 Barbiturate and metabolites    200 Benzodiazepine                 200 Opiates and metabolites        300 Cocaine and metabolites        300 THC                            50 Performed at [facility], [address], [place], [place] [address]   TSH     Status: None   Collection Time: 10/21/22  6:24 AM  Result Value Ref Range   TSH 1.410 0.350 - 4.500 uIU/mL    Comment: Performed by a 3rd Generation assay with a functional sensitivity of <=0.01 uIU/mL. Performed at [facility], [address], [place], [place] [address]   Lipid panel     Status: Abnormal   Collection Time: 10/21/22  6:24 AM  Result  Value Ref Range   Cholesterol 185 0 - 200 mg/dL   Triglycerides 191 <478 mg/dL   HDL 32 (L) >29 mg/dL   Total CHOL/HDL Ratio 5.8 RATIO   VLDL 21 0 - 40 mg/dL   LDL Cholesterol 562 (H) 0 - 99 mg/dL    Comment:        Total Cholesterol/HDL:CHD Risk Coronary Heart Disease Risk Table                     Men   Women  1/2 Average Risk   3.4   3.3  Average Risk       5.0   4.4  2 X Average Risk   9.6   7.1  3 X Average Risk  23.4   11.0        Use the calculated Patient Ratio above and the CHD Risk Table to determine the patient's CHD Risk.        ATP III CLASSIFICATION (LDL):  <100     mg/dL   Optimal  130-865  mg/dL   Near or Above                    Optimal  130-159  mg/dL   Borderline  784-696  mg/dL   High  >295     mg/dL  Very High Performed at [facility], [address] [place], [place] [address]     Blood Alcohol level:  Lab Results  Component Value Date   [facility] <10 10/18/2022   ETH <10 08/23/2022    Metabolic Disorder Labs: Lab Results  Component Value Date   HGBA1C 6.3 (H) 08/28/2022   MPG 134 08/28/2022   MPG 125.5 01/21/2022   No results found for: "PROLACTIN" Lab Results  Component Value Date   CHOL 185 10/21/2022   TRIG 103 10/21/2022   HDL 32 (L) 10/21/2022   CHOLHDL 5.8 10/21/2022   VLDL 21 10/21/2022   LDLCALC 132 (H) 10/21/2022   LDLCALC 142 (H) 08/23/2022    Physical Findings:   Psychiatric Specialty Exam: Physical Exam Constitutional:      Appearance: the patient is not toxic-appearing.  Pulmonary:     Effort: Pulmonary effort is normal.  Neurological:     General: No focal deficit present.     Mental Status: the patient is alert and oriented to person, place, and time.   Review of Systems  Respiratory:  Negative for shortness of breath.   Cardiovascular:  Negative for chest pain.  Gastrointestinal:  Negative for abdominal pain, constipation, diarrhea, nausea and vomiting.  Neurological:  Negative for  headaches.      BP (!) 128/94 (BP Location: Left Arm)   Pulse 87   Temp 98.7 F (37.1 C) (Oral)   Resp 18   Ht 5\' 6"  (1.676 m)   Wt 86 kg   SpO2 99%   BMI 30.60 kg/m   General Appearance: Fairly Groomed  Eye Contact:  Good  Speech:  Clear and Coherent  Volume:  Normal  Mood:  "fine"  Affect:  somewhat depressed  Thought Process:  Coherent  Orientation:  Full (Time, Place, and Person)  Thought Content: Logical   Suicidal Thoughts:  No  Homicidal Thoughts:  No  Memory:  Immediate;   Good  Judgement:  fair  Insight:  poor  Psychomotor Activity:  Normal  Concentration:  Concentration: Good  Recall:  Good  Fund of Knowledge: Good  Language: Good  Akathisia:  No  Handed:  not assessed  AIMS (if indicated): not done  Assets:  Communication Skills Desire for Improvement Financial Resources/Insurance Housing Leisure Time Physical Health  ADL's:  Intact  Cognition: WNL  Sleep:  Fair      Treatment Plan Summary: Daily contact with patient to assess and evaluate symptoms and progress in treatment and Medication management  Assessment:   Diagnoses / Active Problems:   Safety and Monitoring: INVOLUTARILY (will be rescinded on second exam) admission to inpatient psychiatric unit for safety, stabilization and treatment Daily contact with patient to assess and evaluate symptoms and progress in treatment Patient's case to be discussed in multi-disciplinary team meeting Observation Level : q15 minute checks Vital signs: q12 hours Precautions: suicide, elopement, and assault   2. Psychiatric Diagnoses and Treatment # MDD with psychotic features, rule out bipolar disorder with psychotic features, rule out schizoaffective disorder-bipolar type #Homelessness #Malingering - Restarted previously prescribed Abilify 10 mg and Depakote DR 500 mg twice daily             Obtain valproic acid level 5/22 AM   PRN: Continue PRN's: Tylenol, Maalox, Atarax, Milk of Magnesia,  Trazodone    -- The risks/benefits/side-effects/alternatives to this medication were discussed in detail with the patient and time was given for questions. The patient consents to medication trial.  3. Medical Issues Being Addressed: # Chronic pulmonary embolism - Continue Eliquis Dosepak started at [facility]   #Hypertension - Continue home Norvasc 10 mg daily -Will plan to add Coreg or losartan if his hypertension persists - Start clonidine as needed   #GERD - Continue home Protonix 40 mg daily   #Vitamin D deficiency - Continue home vitamin D3 5000 units every morning with breakfast   #Constipation - Continue home Senokot 8.6 mg twice daily   #Neuropathy - Continue home gabapentin 300 mg nightly   4. Discharge Planning:              -- Social work and case management to assist with discharge planning and identification of hospital follow-up needs prior to discharge             -- Estimated LOS: 5-7 days             -- Discharge Concerns: Need to establish a safety plan; Medication compliance and effectiveness             -- Discharge Goals: Return home with outpatient referrals for mental health follow-up including medication management/psychotherapy    Carlyn Reichert, MD PGY-2

## 2022-10-21 NOTE — Progress Notes (Addendum)
Patient stated he slept fine except for having some "bad dreams" last night. Patient med compliant with the exception of senokot this morning and depakote due to stating it makes his bp go up too high. Medications reviewed with patient. Patient compliant with all afternoon medications. Patient's sisters picked up ID and bank card per patient request. Belonging sheet updated accordingly and signed. Explanation provided to patient regarding no liability for belongings taken outside of this facility. Patient verbalized all understanding. Patient received clonidine po prn x2 due to elevated bp. Patient remains stable and asymptomatic. Patient requested to be switched from 500 hall to a lower acuity hall which patient was made aware that this would need to be further discussed with provider tomorrow morning. Denies SI,HI, and A/V/H with no plan or intent. Patient remains cooperative on unit.

## 2022-10-21 NOTE — Group Note (Signed)
Date:  10/21/2022 Time:  9:30 PM  Group Topic/Focus:  Wrap-Up Group:   The focus of this group is to help patients review their daily goal of treatment and discuss progress on daily workbooks.    Participation Level:  Did Not Attend   Scot Dock 10/21/2022, 9:30 PM

## 2022-10-21 NOTE — Progress Notes (Addendum)
   10/21/22 2025  Psych Admission Type (Psych Patients Only)  Admission Status Involuntary  Psychosocial Assessment  Patient Complaints Depression;Irritability  Eye Contact Fair  Facial Expression Anxious  Affect Depressed  Speech Logical/coherent  Interaction Assertive  Motor Activity Slow  Appearance/Hygiene Unremarkable  Behavior Characteristics Cooperative  Mood Depressed  Thought Process  Coherency WDL  Content WDL  Delusions None reported or observed  Perception WDL  Hallucination None reported or observed  Judgment Limited  Confusion None  Danger to Self  Current suicidal ideation? Denies  Danger to Others  Danger to Others None reported or observed   Progress note   D: Pt seen in his room sitting on the bed. Pt denies SI, HI, AVH. Pt rates pain  0/10. Pt rates anxiety  0/10 and depression  10/10. Pt states he is very depressed about his "whole life." He wants to spend time with his children and grandchildren. When asked if he has contacted them, pt replies, "They won't answer the phone." Pt also wants stable housing when he is discharged. States he has refused halfway houses and group homes because, "I am tired of living with street people. I don't want to deal with it anymore. They steal everything. I don't want that type of situation." Pt wants to remain in [place] even though he says he has a place he can stay around [facility].   Pt has also been feeling sleepy and sluggish today. York Spaniel he has been having bad dreams the last 3 nights. "I feel like people are chasing me. Sometimes I am killing people or the dreams are about drugs. I run all night in my dream and I am tired when I wake up." Trazodone held tonight to see if this is the source of his vivid dreams. Pt endorsed only sleeping 2 nights a week before admission. He would sleep in his truck because he is homeless. Pt endorses good appetite. Did not attend groups today because too tired. No other concerns noted at  this time.  A: Pt provided support and encouragement. Pt given scheduled medication as prescribed. PRNs as appropriate. Q15 min checks for safety.   R: Pt safe on the unit. Will continue to monitor.

## 2022-10-21 NOTE — Group Note (Signed)
LCSW Group Therapy Note  Group Date: 10/21/2022 Start Time: 1300 End Time: 1400   Type of Therapy and Topic:  Group Therapy - How To Cope with Nervousness about Discharge   Participation Level:  Did Not Attend   Description of Group This process group involved identification of patients' feelings about discharge. Some of them are scheduled to be discharged soon, while others are new admissions, but each of them was asked to share thoughts and feelings surrounding discharge from the hospital. One common theme was that they are excited at the prospect of going home, while another was that many of them are apprehensive about sharing why they were hospitalized. Patients were given the opportunity to discuss these feelings with their peers in preparation for discharge.  Therapeutic Goals  Patient will identify their overall feelings about pending discharge. Patient will think about how they might proactively address issues that they believe will once again arise once they get home (i.e. with parents). Patients will participate in discussion about having hope for change.   Summary of Patient Progress:  Did not attend   Therapeutic Modalities Cognitive Behavioral Therapy   Beatris Si, LCSW 10/21/2022  1:53 PM

## 2022-10-22 NOTE — Progress Notes (Signed)
Patient is cooperative and med compliant. Patient stated improved sleep even though he is still having some bad dreams just not waking up "as often." Patient stated he has not contacted any [facility] because he is worried about "the blood clot I have in my lungs" and patient stated "I just want to go discharge to my house by tomorrow." Patient denies SI,HI, and A/V/H with no plan or intent. VS stable. Patient in no current distress.   BP (!) 127/93   Pulse 75   Temp 98.7 F (37.1 C) (Oral)   Resp 18   Ht 5\' 6"  (1.676 m)   Wt 86 kg   SpO2 98%   BMI 30.60 kg/m

## 2022-10-22 NOTE — Progress Notes (Signed)
Patient continues to have elevated BP despite medication.  BP before clonidine po prn:137/107    1855 bp after clonidine : 148/117   Patient remains asymptomatic. MD still notified and updated.

## 2022-10-22 NOTE — Progress Notes (Signed)
   10/22/22 2010  Psych Admission Type (Psych Patients Only)  Admission Status Involuntary  Psychosocial Assessment  Patient Complaints Depression;Anxiety;Sleep disturbance;Other (Comment) (frustration)  Eye Contact Fair  Facial Expression Anxious  Affect Anxious;Depressed;Irritable  Speech Logical/coherent  Interaction Assertive  Motor Activity Slow  Appearance/Hygiene Unremarkable  Mood Depressed;Anxious  Thought Process  Coherency WDL  Content WDL  Delusions None reported or observed  Perception WDL  Hallucination None reported or observed  Judgment WDL  Confusion None  Danger to Self  Current suicidal ideation? Denies  Danger to Others  Danger to Others None reported or observed   Progress note   D: Pt seen in his room. Pt denies SI, HI, AVH. Pt rates pain  0/10. Pt endorses a lot of anxiey and depression. "I stay that way." Pt is frustrated because his blood pressure is elevated and he believes nothing is being done about it here. "I came to the hospital to get checked out with my blood pressure and I had a clot in my lung. I had an argument with the lady drawing blood and they sent me here. They gonna tell me to follow up with my primary doctor (about blood pressure) when I leave but my primary is 200 miles away. I'm frustrated because nothing is being done. Why keep me here and not help me?"   Pt says that he did get his sister to pay his bills (he consented for her to take his bank card) so he is feeling good about that. Pt states what gives him hope is the thought of seeing his children and grandchildren and also reminiscing about the good old days growing up. "I think back on learning to ride a horse and a pig and drive a car. I remember feeding the pigs before school and seeing my sisters and picking flowers and stuff." Pt denies any symptoms of his elevated blood pressure. Reports good sleep last night and appetite. No other concerns noted at this time.  A: Pt provided  support and encouragement. Pt given scheduled medication as prescribed. PRNs as appropriate. Q15 min checks for safety.   R: Pt safe on the unit. Will continue to monitor.

## 2022-10-22 NOTE — Group Note (Signed)
Recreation Therapy Group Note   Group Topic:Animal Assisted Therapy   Group Date: 10/22/2022 Start Time: 0945 Facilitators: Johnnye Sandford, Benito Mccreedy, LRT   Animal-Assisted Activity (AAA) Program Checklist/Progress Note Patient Eligibility Criteria Checklist & Daily Group note for Rec Tx Intervention   AAA/T Program Assumption of Risk Form signed by Patient/ or Parent Legal Guardian YES   Group Description: Patients provided opportunity to interact with trained and credentialed [employer] Therapy dog and the community volunteer/dog handler.    Affect/Mood: N/A   Participation Level: Did not attend    Clinical Observations/Individualized Feedback:  Pt did not meet criteria to engage in AAA programming. Pt remained on [address] hall, BICU.    Benito Mccreedy Calyx Hawker, LRT, CTRS 10/22/2022 2:41 PM

## 2022-10-22 NOTE — Group Note (Signed)
Date:  10/22/2022 Time:  10:37 AM  Group Topic/Focus:  Goals Group:   The focus of this group is to help patients establish daily goals to achieve during treatment and discuss how the patient can incorporate goal setting into their daily lives to aide in recovery. Orientation:   The focus of this group is to educate the patient on the purpose and policies of crisis stabilization and provide a format to answer questions about their admission.  The group details unit policies and expectations of patients while admitted.    Participation Level:  Did Not Attend   Charles Estes Charles Estes 10/22/2022, 10:37 AM

## 2022-10-22 NOTE — Plan of Care (Signed)
  Problem: Coping: Goal: Ability to verbalize frustrations and anger appropriately will improve Outcome: Progressing   Problem: Health Behavior/Discharge Planning: Goal: Compliance with treatment plan for underlying cause of condition will improve Outcome: Progressing   Problem: Safety: Goal: Periods of time without injury will increase Outcome: Progressing   

## 2022-10-22 NOTE — Progress Notes (Signed)
[facility] MD Progress Note  10/22/2022 5:30 PM [name]  MRN:  [id number]  Principal Problem: Severe recurrent major depressive disorder with psychotic features (HCC) Diagnosis: Principal Problem:   Severe recurrent major depressive disorder with psychotic features (HCC) Active Problems:   Cocaine use disorder (HCC)   Alcohol use   Gastroesophageal reflux disease   Vitamin D deficiency   Essential hypertension   Pulmonary embolism (HCC)   Suicidal thoughts   Homelessness    Reason for Admission:  Charles Estes is a 62 year old male with a past psychiatric history of bipolar disorder, stimulant use disorder, GAD with panic attacks, cannabis use disorder, and alcohol use disorder who presented under IVC from [facility] for suicidal and homicidal ideation.    Information obtained from 24-hour nursing report: The patient was noted to be cooperative.   Information Obtained Today During Patient Interview:  Today the patient exhibits a linear and logical thought process.  His affect is appropriate.  He is amenable to starting trazodone back.  He reports a strong desire to make phone calls to [facility].  Discussed that we will follow-up tomorrow and he is agreeable.  He denies experiencing medication side effects.  He reports appropriate sleep and appetite.  He reports that his mood is "fine".  He denies experiencing suicidal thoughts.  No homicidal thoughts or auditory visual hallucinations.  Total Time spent with patient: 20 min  Past Psychiatric History: as per H and P  Past Medical History:  Past Medical History:  Diagnosis Date   Depression    GERD (gastroesophageal reflux disease)    Hypertension    Pathologic ulnar fracture with malunion    right    Past Surgical History:  Procedure Laterality Date   MANDIBLE FRACTURE SURGERY  2014   NO PAST SURGERIES     ORIF ULNAR FRACTURE Right 07/16/2017   Procedure: OPEN REDUCTION INTERNAL FIXATION (ORIF) RIGHT ULNA FRACTURE  NONUNION;  Surgeon: Tarry Kos, MD;  Location: [facility];  Service: Orthopedics;  Laterality: Right;   Family History:  Family History  Problem Relation Age of Onset   Hypertension Mother    Heart attack Mother    Hypertension Father    Heart attack Father    Heart attack Sister    Hypertension Sister    Heart attack Sister    Hypertension Brother    Family Psychiatric  History: as per H and P Social History:  Social History   Substance and Sexual Activity  Alcohol Use Yes   Comment: socially- past weekend reports 10 beers     Social History   Substance and Sexual Activity  Drug Use Yes   Types: Cocaine   Comment: occasionally    Social History   Socioeconomic History   Marital status: Divorced    Spouse name: Not on file   Number of children: Not on file   Years of education: 14   Highest education level: Associate degree: occupational, Scientist, product/process development, or vocational program  Occupational History   Not on file  Tobacco Use   Smoking status: Never   Smokeless tobacco: Never  Substance and Sexual Activity   Alcohol use: Yes    Comment: socially- past weekend reports 10 beers   Drug use: Yes    Types: Cocaine    Comment: occasionally   Sexual activity: Yes    Birth control/protection: None  Other Topics Concern   Not on file  Social History Narrative   Not on  file   Social Determinants of Health   Financial Resource Strain: Not on file  Food Insecurity: Food Insecurity Present (10/19/2022)   Hunger Vital Sign    Worried About Running Out of Food in the Last Year: Sometimes true    Ran Out of Food in the Last Year: Sometimes true  Transportation Needs: No Transportation Needs (10/19/2022)   PRAPARE - Administrator, Civil Service (Medical): No    Lack of Transportation (Non-Medical): No  Recent Concern: Transportation Needs - Unmet Transportation Needs (08/26/2022)   PRAPARE - Administrator, Civil Service (Medical): Yes     Lack of Transportation (Non-Medical): Yes  Physical Activity: Not on file  Stress: Not on file  Social Connections: Not on file   Additional Social History:                         Sleep: fair  Appetite: fair  Current Medications: Current Facility-Administered Medications  Medication Dose Route Frequency Provider Last Rate Last Admin   amLODipine (NORVASC) tablet 10 mg  10 mg Oral Daily White, Patrice L, NP   10 mg at 10/22/22 1610   apixaban (ELIQUIS) tablet 10 mg  10 mg Oral BID White, Patrice L, NP   10 mg at 10/22/22 0830   Followed by   Melene Muller ON 10/26/2022] apixaban (ELIQUIS) tablet 5 mg  5 mg Oral BID White, Patrice L, NP       ARIPiprazole (ABILIFY) tablet 10 mg  10 mg Oral Daily White, Patrice L, NP   10 mg at 10/22/22 0830   cloNIDine (CATAPRES) tablet 0.1 mg  0.1 mg Oral Q6H PRN Abbott Pao, Nadir, MD   0.1 mg at 10/22/22 1726   diphenhydrAMINE (BENADRYL) capsule 50 mg  50 mg Oral TID PRN White, Patrice L, NP       Or   diphenhydrAMINE (BENADRYL) injection 50 mg  50 mg Intramuscular TID PRN White, Patrice L, NP       divalproex (DEPAKOTE) DR tablet 500 mg  500 mg Oral Q12H White, Patrice L, NP   500 mg at 10/22/22 0830   gabapentin (NEURONTIN) capsule 300 mg  300 mg Oral QHS White, Patrice L, NP   300 mg at 10/21/22 2101   haloperidol (HALDOL) tablet 5 mg  5 mg Oral TID PRN White, Patrice L, NP       Or   haloperidol lactate (HALDOL) injection 5 mg  5 mg Intramuscular TID PRN White, Patrice L, NP       LORazepam (ATIVAN) tablet 2 mg  2 mg Oral TID PRN White, Patrice L, NP       Or   LORazepam (ATIVAN) injection 2 mg  2 mg Intramuscular TID PRN White, Patrice L, NP       pantoprazole (PROTONIX) EC tablet 40 mg  40 mg Oral Daily White, Patrice L, NP   40 mg at 10/22/22 0830   senna (SENOKOT) tablet 8.6 mg  1 tablet Oral BID White, Patrice L, NP   8.6 mg at 10/22/22 1726   traZODone (DESYREL) tablet 100 mg  100 mg Oral QHS PRN White, Patrice L, NP   100 mg at  10/20/22 2037   vitamin D3 (CHOLECALCIFEROL) tablet 5,000 Units  5,000 Units Oral Q breakfast White, Patrice L, NP   5,000 Units at 10/22/22 0830    Lab Results:  Results for orders placed or performed during the hospital encounter of 10/19/22 (from  the past 48 hour(s))  TSH     Status: None   Collection Time: 10/21/22  6:24 AM  Result Value Ref Range   TSH 1.410 0.350 - 4.500 uIU/mL    Comment: Performed by a 3rd Generation assay with a functional sensitivity of <=0.01 uIU/mL. Performed at [facility], [address] [place], [place] [address]   Lipid panel     Status: Abnormal   Collection Time: 10/21/22  6:24 AM  Result Value Ref Range   Cholesterol 185 0 - 200 mg/dL   Triglycerides 604 <540 mg/dL   HDL 32 (L) >98 mg/dL   Total CHOL/HDL Ratio 5.8 RATIO   VLDL 21 0 - 40 mg/dL   LDL Cholesterol 119 (H) 0 - 99 mg/dL    Comment:        Total Cholesterol/HDL:CHD Risk Coronary Heart Disease Risk Table                     Men   Women  1/2 Average Risk   3.4   3.3  Average Risk       5.0   4.4  2 X Average Risk   9.6   7.1  3 X Average Risk  23.4   11.0        Use the calculated Patient Ratio above and the CHD Risk Table to determine the patient's CHD Risk.        ATP III CLASSIFICATION (LDL):  <100     mg/dL   Optimal  147-829  mg/dL   Near or Above                    Optimal  130-159  mg/dL   Borderline  562-130  mg/dL   High  >865     mg/dL   Very High Performed at [facility], [address], [place], [place] [address]   Hemoglobin A1c     Status: Abnormal   Collection Time: 10/21/22  6:24 AM  Result Value Ref Range   Hgb A1c MFr Bld 6.0 (H) 4.8 - 5.6 %    Comment: (NOTE) Pre diabetes:          5.7%-6.4%  Diabetes:              >6.4%  Glycemic control for   <7.0% adults with diabetes    Mean Plasma Glucose 125.5 mg/dL    Comment: Performed at [facility], [address], [place], [place] [address]    Blood  Alcohol level:  Lab Results  Component Value Date   [facility] <10 10/18/2022   ETH <10 08/23/2022    Metabolic Disorder Labs: Lab Results  Component Value Date   HGBA1C 6.0 (H) 10/21/2022   MPG 125.5 10/21/2022   MPG 134 08/28/2022   No results found for: "PROLACTIN" Lab Results  Component Value Date   CHOL 185 10/21/2022   TRIG 103 10/21/2022   HDL 32 (L) 10/21/2022   CHOLHDL 5.8 10/21/2022   VLDL 21 10/21/2022   LDLCALC 132 (H) 10/21/2022   LDLCALC 142 (H) 08/23/2022    Physical Findings:   Psychiatric Specialty Exam: Physical Exam Constitutional:      Appearance: the patient is not toxic-appearing.  Pulmonary:     Effort: Pulmonary effort is normal.  Neurological:     General: No focal deficit present.     Mental Status: the patient is alert and oriented to person, place, and time.  Review of Systems  Respiratory:  Negative for shortness of breath.   Cardiovascular:  Negative for chest pain.  Gastrointestinal:  Negative for abdominal pain, constipation, diarrhea, nausea and vomiting.  Neurological:  Negative for headaches.      BP (!) 136/107   Pulse 78   Temp 98.6 F (37 C) (Oral)   Resp 18   Ht 5\' 6"  (1.676 m)   Wt 86 kg   SpO2 99%   BMI 30.60 kg/m   General Appearance: Fairly Groomed  Eye Contact:  Good  Speech:  Clear and Coherent  Volume:  Normal  Mood:  "fine"  Affect:  appropriate  Thought Process:  Coherent  Orientation:  Full (Time, Place, and Person)  Thought Content: Logical   Suicidal Thoughts:  No  Homicidal Thoughts:  No  Memory:  Immediate;   Good  Judgement:  fair  Insight:  poor  Psychomotor Activity:  Normal  Concentration:  Concentration: Good  Recall:  Good  Fund of Knowledge: Good  Language: Good  Akathisia:  No  Handed:  not assessed  AIMS (if indicated): not done  Assets:  Communication Skills Desire for Improvement Financial Resources/Insurance Housing Leisure Time Physical Health  ADL's:  Intact  Cognition:  WNL  Sleep:  Fair      Treatment Plan Summary: Daily contact with patient to assess and evaluate symptoms and progress in treatment and Medication management  Assessment:   Diagnoses / Active Problems:   Safety and Monitoring: INVOLUTARILY (will be rescinded on second exam) admission to inpatient psychiatric unit for safety, stabilization and treatment Daily contact with patient to assess and evaluate symptoms and progress in treatment Patient's case to be discussed in multi-disciplinary team meeting Observation Level : q15 minute checks Vital signs: q12 hours Precautions: suicide, elopement, and assault   2. Psychiatric Diagnoses and Treatment # MDD with psychotic features, rule out bipolar disorder with psychotic features, rule out schizoaffective disorder-bipolar type #Homelessness #Malingering - Restarted previously prescribed Abilify 10 mg and Depakote DR 500 mg twice daily             Obtain valproic acid level 5/22 AM   PRN: Continue PRN's: Tylenol, Maalox, Atarax, Milk of Magnesia, Trazodone    -- The risks/benefits/side-effects/alternatives to this medication were discussed in detail with the patient and time was given for questions. The patient consents to medication trial.                 3. Medical Issues Being Addressed: # Chronic pulmonary embolism - Continue Eliquis Dosepak started at [facility]   #Hypertension - Continue home Norvasc 10 mg daily -Will plan to add Coreg or losartan if his hypertension persists - Start clonidine as needed   #GERD - Continue home Protonix 40 mg daily   #Vitamin D deficiency - Continue home vitamin D3 5000 units every morning with breakfast   #Constipation - Continue home Senokot 8.6 mg twice daily   #Neuropathy - Continue home gabapentin 300 mg nightly   4. Discharge Planning:              -- Social work and case management to assist with discharge planning and identification of hospital follow-up needs  prior to discharge             -- Estimated LOS: 5-7 days             -- Discharge Concerns: Need to establish a safety plan; Medication compliance and effectiveness             --  Discharge Goals: Return home with outpatient referrals for mental health follow-up including medication management/psychotherapy    Carlyn Reichert, MD PGY-2

## 2022-10-22 NOTE — Progress Notes (Signed)
[facility] LCSW Progress Note   10/22/2022    8:31 AM  Charles Estes   [id number]   Type of Contact and Topic:  [employer]  CSW provided patient a list of [facility] in [place] and instructed for patient to call for availability and admittance into their sober living home.  Patient agreed.     Signed:  Anson Oregon MSW, LCSW, LCAS 10/22/2022 8:31 AM

## 2022-10-23 LAB — VALPROIC ACID LEVEL: Valproic Acid Lvl: 49 ug/mL — ABNORMAL LOW (ref 50.0–100.0)

## 2022-10-23 MED ORDER — LISINOPRIL 10 MG PO TABS
10.0000 mg | ORAL_TABLET | Freq: Every day | ORAL | Status: DC
  Administered 2022-10-23 – 2022-10-24 (×2): 10 mg via ORAL
  Filled 2022-10-23 (×2): qty 1
  Filled 2022-10-23: qty 2
  Filled 2022-10-23: qty 1

## 2022-10-23 NOTE — Progress Notes (Signed)
Pt medication compliant. Pt hypertensive, was resistant to medication stating "I dont want another medication for my blood pressure I am not your [place] pig!" Pt however accepted medication after receiving education from provider. Pt denies SI/HI/AVH. Q 15 minute checks ongoing for safety.

## 2022-10-23 NOTE — Progress Notes (Signed)
   10/23/22 1610  15 Minute Checks  Location Bedroom  Visual Appearance Calm  Behavior Composed  Sleep (Behavioral Health Patients Only)  Calculate sleep? (Click Yes once per 24 hr at 0600 safety check) Yes  Documented sleep last 24 hours 10.5

## 2022-10-23 NOTE — Progress Notes (Signed)
   10/23/22 2100  Psych Admission Type (Psych Patients Only)  Admission Status Involuntary  Psychosocial Assessment  Patient Complaints Depression  Eye Contact Fair  Facial Expression Anxious  Affect Depressed  Speech Logical/coherent  Interaction Assertive  Motor Activity Slow  Appearance/Hygiene Unremarkable  Behavior Characteristics Cooperative  Mood Depressed;Anxious  Aggressive Behavior  Effect No apparent injury  Thought Process  Coherency WDL  Content WDL  Delusions None reported or observed  Perception WDL  Hallucination None reported or observed  Judgment WDL  Confusion None  Danger to Self  Current suicidal ideation? Denies  Danger to Others  Danger to Others None reported or observed  Danger to Others Abnormal  Harmful Behavior to others No threats or harm toward other people

## 2022-10-23 NOTE — Progress Notes (Signed)
[facility] LCSW Progress Note   10/23/2022    9:58 AM  Jachai Yetta Barre   [id number]   Type of Contact and Topic:  Medicare Notice  Patient informed of right to appeal discharge, provided phone number to [facility]. Patient expressed no interest in appealing discharge at this time. CSW will continue to monitor situation.     Signed:  Anson Oregon MSW, LCSW, LCAS 10/23/2022 9:58 AM

## 2022-10-23 NOTE — Progress Notes (Signed)
   10/23/22 0609  Vital Signs  Pulse Rate 91  BP (!) 122/105  BP Location Right Arm  BP Method Automatic  Patient Position (if appropriate) Standing   Pt asymptomatic. Refused any PRNs for blood pressure. "They keep trying medications. It didn't work yesterday. What makes you think it will work today? If you don't mind, I'll wait to take another medication once I speak to a doctor."

## 2022-10-23 NOTE — Progress Notes (Signed)
[facility] MD Progress Note  10/23/2022 1:18 PM Charles Estes  MRN:  [id number]  Principal Problem: Severe recurrent major depressive disorder with psychotic features (HCC) Diagnosis: Principal Problem:   Severe recurrent major depressive disorder with psychotic features (HCC) Active Problems:   Cocaine use disorder (HCC)   Alcohol use   Gastroesophageal reflux disease   Vitamin D deficiency   Essential hypertension   Pulmonary embolism (HCC)   Suicidal thoughts   Homelessness    Reason for Admission:  Charles Estes is a 62 year old male with a past psychiatric history of bipolar disorder, stimulant use disorder, GAD with panic attacks, cannabis use disorder, and alcohol use disorder who presented under IVC from [facility] for suicidal and homicidal ideation.    Information obtained from 24-hour nursing report: The patient was noted to be cooperative.   Information Obtained Today During Patient Interview:  Today the patient exhibits a linear and logical thought process.  His affect is appropriate.  He states that he desires to discharge soon because his sister is able to pick him up and take him to her home in [place] [facility].  We discussed with him his hypertension.  I spoke with the hospitalist and reviewed his case as discussed below.  I discussed starting lisinopril with the patient and he was agreeable.  He reports that he has his own PCP in the area that he will be going to.  We discussed that he should follow-up soon after arriving.  The patient reports good mood, appetite, and sleep. He denies suicidal and homicidal thoughts. He denies side effects from his medications.  Review of systems as below.   Total Time spent with patient: 20 min  Past Psychiatric History: as per H and P  Past Medical History:  Past Medical History:  Diagnosis Date   Depression    GERD (gastroesophageal reflux disease)    Hypertension    Pathologic ulnar fracture with malunion     right    Past Surgical History:  Procedure Laterality Date   MANDIBLE FRACTURE SURGERY  2014   NO PAST SURGERIES     ORIF ULNAR FRACTURE Right 07/16/2017   Procedure: OPEN REDUCTION INTERNAL FIXATION (ORIF) RIGHT ULNA FRACTURE NONUNION;  Surgeon: Tarry Kos, MD;  Location: [facility];  Service: Orthopedics;  Laterality: Right;   Family History:  Family History  Problem Relation Age of Onset   Hypertension Mother    Heart attack Mother    Hypertension Father    Heart attack Father    Heart attack Sister    Hypertension Sister    Heart attack Sister    Hypertension Brother    Family Psychiatric  History: as per H and P Social History:  Social History   Substance and Sexual Activity  Alcohol Use Yes   Comment: socially- past weekend reports 10 beers     Social History   Substance and Sexual Activity  Drug Use Yes   Types: Cocaine   Comment: occasionally    Social History   Socioeconomic History   Marital status: Divorced    Spouse name: Not on file   Number of children: Not on file   Years of education: 14   Highest education level: Associate degree: occupational, Scientist, product/process development, or vocational program  Occupational History   Not on file  Tobacco Use   Smoking status: Never   Smokeless tobacco: Never  Substance and Sexual Activity   Alcohol use: Yes  Comment: socially- past weekend reports 10 beers   Drug use: Yes    Types: Cocaine    Comment: occasionally   Sexual activity: Yes    Birth control/protection: None  Other Topics Concern   Not on file  Social History Narrative   Not on file   Social Determinants of Health   Financial Resource Strain: Not on file  Food Insecurity: Food Insecurity Present (10/19/2022)   Hunger Vital Sign    Worried About Running Out of Food in the Last Year: Sometimes true    Ran Out of Food in the Last Year: Sometimes true  Transportation Needs: No Transportation Needs (10/19/2022)   PRAPARE - Therapist, art (Medical): No    Lack of Transportation (Non-Medical): No  Recent Concern: Transportation Needs - Unmet Transportation Needs (08/26/2022)   PRAPARE - Administrator, Civil Service (Medical): Yes    Lack of Transportation (Non-Medical): Yes  Physical Activity: Not on file  Stress: Not on file  Social Connections: Not on file   Additional Social History:                         Sleep: fair  Appetite: fair  Current Medications: Current Facility-Administered Medications  Medication Dose Route Frequency Provider Last Rate Last Admin   amLODipine (NORVASC) tablet 10 mg  10 mg Oral Daily White, Patrice L, NP   10 mg at 10/23/22 0809   apixaban (ELIQUIS) tablet 10 mg  10 mg Oral BID White, Patrice L, NP   10 mg at 10/23/22 0809   Followed by   Melene Muller ON 10/26/2022] apixaban (ELIQUIS) tablet 5 mg  5 mg Oral BID White, Patrice L, NP       ARIPiprazole (ABILIFY) tablet 10 mg  10 mg Oral Daily White, Patrice L, NP   10 mg at 10/23/22 0809   cloNIDine (CATAPRES) tablet 0.1 mg  0.1 mg Oral Q6H PRN Abbott Pao, Nadir, MD   0.1 mg at 10/22/22 1726   diphenhydrAMINE (BENADRYL) capsule 50 mg  50 mg Oral TID PRN White, Patrice L, NP       Or   diphenhydrAMINE (BENADRYL) injection 50 mg  50 mg Intramuscular TID PRN White, Patrice L, NP       divalproex (DEPAKOTE) DR tablet 500 mg  500 mg Oral Q12H White, Patrice L, NP   500 mg at 10/23/22 0809   gabapentin (NEURONTIN) capsule 300 mg  300 mg Oral QHS White, Patrice L, NP   300 mg at 10/22/22 2033   haloperidol (HALDOL) tablet 5 mg  5 mg Oral TID PRN White, Patrice L, NP       Or   haloperidol lactate (HALDOL) injection 5 mg  5 mg Intramuscular TID PRN White, Patrice L, NP       lisinopril (ZESTRIL) tablet 10 mg  10 mg Oral Daily Carlyn Reichert, MD   10 mg at 10/23/22 1127   LORazepam (ATIVAN) tablet 2 mg  2 mg Oral TID PRN White, Patrice L, NP       Or   LORazepam (ATIVAN) injection 2 mg  2 mg Intramuscular  TID PRN White, Patrice L, NP       pantoprazole (PROTONIX) EC tablet 40 mg  40 mg Oral Daily White, Patrice L, NP   40 mg at 10/23/22 0809   senna (SENOKOT) tablet 8.6 mg  1 tablet Oral BID White, Patrice L, NP   8.6  mg at 10/23/22 0809   traZODone (DESYREL) tablet 100 mg  100 mg Oral QHS PRN White, Patrice L, NP   100 mg at 10/20/22 2037   vitamin D3 (CHOLECALCIFEROL) tablet 5,000 Units  5,000 Units Oral Q breakfast White, Patrice L, NP   5,000 Units at 10/23/22 4098    Lab Results:  Results for orders placed or performed during the hospital encounter of 10/19/22 (from the past 48 hour(s))  Valproic acid level     Status: Abnormal   Collection Time: 10/23/22  6:32 AM  Result Value Ref Range   Valproic Acid Lvl 49 (L) 50.0 - 100.0 ug/mL    Comment: Performed at [facility], [address], [place], [place] [address]    Blood Alcohol level:  Lab Results  Component Value Date   [facility] <10 10/18/2022   ETH <10 08/23/2022    Metabolic Disorder Labs: Lab Results  Component Value Date   HGBA1C 6.0 (H) 10/21/2022   MPG 125.5 10/21/2022   MPG 134 08/28/2022   No results found for: "PROLACTIN" Lab Results  Component Value Date   CHOL 185 10/21/2022   TRIG 103 10/21/2022   HDL 32 (L) 10/21/2022   CHOLHDL 5.8 10/21/2022   VLDL 21 10/21/2022   LDLCALC 132 (H) 10/21/2022   LDLCALC 142 (H) 08/23/2022    Physical Findings:   Psychiatric Specialty Exam: Physical Exam Constitutional:      Appearance: the patient is not toxic-appearing.  Pulmonary:     Effort: Pulmonary effort is normal.  Neurological:     General: No focal deficit present.     Mental Status: the patient is alert and oriented to person, place, and time.   Review of Systems  Respiratory:  Negative for shortness of breath.   Cardiovascular:  Negative for chest pain.  Gastrointestinal:  Negative for abdominal pain, constipation, diarrhea, nausea and vomiting.  Neurological:  Negative for  headaches.      BP (!) 122/105 (BP Location: Right Arm)   Pulse 91   Temp 98.8 F (37.1 C) (Oral)   Resp 18   Ht 5\' 6"  (1.676 m)   Wt 86 kg   SpO2 100%   BMI 30.60 kg/m   General Appearance: Fairly Groomed  Eye Contact:  Good  Speech:  Clear and Coherent  Volume:  Normal  Mood:  "fine"  Affect:  appropriate  Thought Process:  Coherent  Orientation:  Full (Time, Place, and Person)  Thought Content: Logical   Suicidal Thoughts:  No  Homicidal Thoughts:  No  Memory:  Immediate;   Good  Judgement:  fair  Insight:  poor  Psychomotor Activity:  Normal  Concentration:  Concentration: Good  Recall:  Good  Fund of Knowledge: Good  Language: Good  Akathisia:  No  Handed:  not assessed  AIMS (if indicated): not done  Assets:  Communication Skills Desire for Improvement Financial Resources/Insurance Housing Leisure Time Physical Health  ADL's:  Intact  Cognition: WNL  Sleep:  Fair      Treatment Plan Summary: Daily contact with patient to assess and evaluate symptoms and progress in treatment and Medication management  Assessment:   Diagnoses / Active Problems:   Safety and Monitoring: INVOLUTARILY (will be rescinded on second exam) admission to inpatient psychiatric unit for safety, stabilization and treatment Daily contact with patient to assess and evaluate symptoms and progress in treatment Patient's case to be discussed in multi-disciplinary team meeting Observation Level : q15 minute checks Vital signs:  q12 hours Precautions: suicide, elopement, and assault   2. Psychiatric Diagnoses and Treatment # MDD with psychotic features, rule out bipolar disorder with psychotic features, rule out schizoaffective disorder-bipolar type #Homelessness #Malingering - Restarted previously prescribed Abilify 10 mg and Depakote DR 500 mg twice daily             Obtain valproic acid level 5/23 AM   PRN: Continue PRN's: Tylenol, Maalox, Atarax, Milk of Magnesia, Trazodone     -- The risks/benefits/side-effects/alternatives to this medication were discussed in detail with the patient and time was given for questions. The patient consents to medication trial.                 3. Medical Issues Being Addressed: # Chronic pulmonary embolism - Continue Eliquis Dosepak started at [facility]   #Hypertension - Continue home Norvasc 10 mg daily Lisinopril 10 mg daily is added to his regimen.  I called and discussed his case with the hospitalist Dr. Erenest Blank.  He feels that this plan of care is appropriate.   #GERD - Continue home Protonix 40 mg daily   #Vitamin D deficiency - Continue home vitamin D3 5000 units every morning with breakfast   #Constipation - Continue home Senokot 8.6 mg twice daily   #Neuropathy - Continue home gabapentin 300 mg nightly   4. Discharge Planning:              -- Social work and case management to assist with discharge planning and identification of hospital follow-up needs prior to discharge             -- Estimated LOS: 5-7 days             -- Discharge Concerns: Need to establish a safety plan; Medication compliance and effectiveness             -- Discharge Goals: Return home with outpatient referrals for mental health follow-up including medication management/psychotherapy    Carlyn Reichert, MD PGY-2

## 2022-10-23 NOTE — [facility] Suicide Risk Assessment (Signed)
[facility] INPATIENT:  Family/Significant Other Suicide Prevention Education  Suicide Prevention Education:  Patient Refusal for Family/Significant Other Suicide Prevention Education: The patient Charles Estes has refused to provide written consent for family/significant other to be provided Family/Significant Other Suicide Prevention Education during admission and/or prior to discharge.  Physician notified.  Terrick Allred E Marcelina Mclaurin 10/23/2022, 10:29 AM

## 2022-10-23 NOTE — [facility] Group Notes (Signed)
Adult Psychoeducational Group Note  Date:  10/23/2022 Time:  11:40 PM  Group Topic/Focus:  Wrap-Up Group:   The focus of this group is to help patients review their daily goal of treatment and discuss progress on daily workbooks.  Participation Level:  Did Not Attend  Participation Quality:  Did not attend  Affect:  Did not attend  Cognitive:  Did not attend  Insight: None  Engagement in Group:  Did not attend  Modes of Intervention:  Did not attend  Additional Comments:  Did not attend  Shaunika Italiano Lee 10/23/2022, 11:40 PM 

## 2022-10-24 ENCOUNTER — Telehealth (HOSPITAL_COMMUNITY): Payer: Self-pay | Admitting: *Deleted

## 2022-10-24 LAB — VALPROIC ACID LEVEL: Valproic Acid Lvl: 38 ug/mL — ABNORMAL LOW (ref 50.0–100.0)

## 2022-10-24 MED ORDER — ARIPIPRAZOLE 10 MG PO TABS: 10.0000 mg | ORAL_TABLET | Freq: Every day | ORAL | 0 refills | Status: DC

## 2022-10-24 MED ORDER — DIVALPROEX SODIUM 500 MG PO DR TAB: 500.0000 mg | DELAYED_RELEASE_TABLET | Freq: Two times a day (BID) | ORAL | 0 refills | Status: DC

## 2022-10-24 MED ORDER — TRAZODONE HCL 100 MG PO TABS: 100.0000 mg | ORAL_TABLET | Freq: Every evening | ORAL | 0 refills | Status: DC | PRN

## 2022-10-24 MED ORDER — LISINOPRIL 10 MG PO TABS: 10.0000 mg | ORAL_TABLET | Freq: Every day | ORAL | 0 refills | Status: DC

## 2022-10-24 MED ORDER — APIXABAN 5 MG PO TABS: 10.0000 mg | ORAL_TABLET | Freq: Two times a day (BID) | ORAL | 0 refills | Status: DC

## 2022-10-24 MED ORDER — APIXABAN 5 MG PO TABS: 5.0000 mg | ORAL_TABLET | Freq: Two times a day (BID) | ORAL | 0 refills | Status: DC

## 2022-10-24 MED ORDER — GABAPENTIN 300 MG PO CAPS: 300.0000 mg | ORAL_CAPSULE | Freq: Every day | ORAL | 0 refills | Status: DC

## 2022-10-24 NOTE — Plan of Care (Signed)
  Problem: Education: Goal: Emotional status will improve Outcome: Progressing Goal: Mental status will improve Outcome: Progressing   Problem: Health Behavior/Discharge Planning: Goal: Identification of resources available to assist in meeting health care needs will improve Outcome: Progressing   

## 2022-10-24 NOTE — [facility] Suicide Risk Assessment (Signed)
[facility] Discharge Suicide Risk Assessment   Principal Problem: Severe recurrent major depressive disorder with psychotic features [facility]) Discharge Diagnoses: Principal Problem:   Severe recurrent major depressive disorder with psychotic features (HCC) Active Problems:   Cocaine use disorder (HCC)   Alcohol use   Gastroesophageal reflux disease   Vitamin D deficiency   Essential hypertension   Pulmonary embolism (HCC)   Suicidal thoughts   Homelessness    Total Time spent with patient: 15 min   During the patient's hospitalization, patient had extensive initial psychiatric evaluation, and follow-up psychiatric evaluations every day.  Upon evaluation, psychiatric diagnoses were given as follows:  Major depressive disorder with psychotic features  Patient's psychiatric medications were adjusted on admission:  Restarted home Abilify 10 mg daily and Depakote direct release 500 mg twice daily  During the hospitalization, other adjustments were made to the patient's psychiatric medication regimen:  Depakote level found to be nontoxic, below 50  Gradually, patient started adjusting to milieu.   Patient's care was discussed during the interdisciplinary team meeting every day during the hospitalization.  The patient denied having side effects to prescribed psychiatric medication.  The patient reports their target psychiatric symptoms of depression and homicidal thoughts responded well to the psychiatric medications, and the patient reports overall benefit other psychiatric hospitalization. Supportive psychotherapy was provided to the patient. The patient also participated in regular group therapy while admitted.   Labs were reviewed with the patient, and abnormal results were discussed with the patient.  The patient denied having suicidal thoughts more than 48 hours prior to discharge.  Patient denies having homicidal thoughts.  Patient denies having auditory hallucinations.  Patient  denies any visual hallucinations.  Patient denies having paranoid thoughts.  The patient is able to verbalize their individual safety plan to this provider.  It is recommended to the patient to continue psychiatric medications as prescribed, after discharge from the hospital.    It is recommended to the patient to follow up with your outpatient psychiatric provider and PCP.  Discussed with the patient, the impact of alcohol, drugs, tobacco have been there overall psychiatric and medical wellbeing, and total abstinence from substance use was recommended the patient.    Psychiatric Specialty Exam: Physical Exam Constitutional:      Appearance: the patient is not toxic-appearing.  Pulmonary:     Effort: Pulmonary effort is normal.  Neurological:     General: No focal deficit present.     Mental Status: the patient is alert and oriented to person, place, and time.   Review of Systems  Respiratory:  Negative for shortness of breath.   Cardiovascular:  Negative for chest pain.  Gastrointestinal:  Negative for abdominal pain, constipation, diarrhea, nausea and vomiting.  Neurological:  Negative for headaches.      BP 118/82 (BP Location: Right Arm)   Pulse (!) 105   Temp 98.5 F (36.9 C) (Oral)   Resp 16   Ht 5\' 6"  (1.676 m)   Wt 86 kg   SpO2 100%   BMI 30.60 kg/m   General Appearance: Fairly Groomed  Eye Contact:  Good  Speech:  Clear and Coherent  Volume:  Normal  Mood:  Euthymic  Affect:  Congruent  Thought Process:  Coherent  Orientation:  Full (Time, Place, and Person)  Thought Content: Logical   Suicidal Thoughts:  No  Homicidal Thoughts:  No  Memory:  Immediate;   Good  Judgement:  fair  Insight:  fair  Psychomotor Activity:  Normal  Concentration:  Concentration: Good  Recall:  Good  Fund of Knowledge: Good  Language: Good  Akathisia:  No  Handed:  not assessed  AIMS (if indicated): not done  Assets:  Communication Skills Desire for  Improvement Financial Resources/Insurance Housing Leisure Time Physical Health  ADL's:  Intact  Cognition: WNL  Sleep:  Fair     Mental Status Per Nursing Assessment::   On Admission:  Suicidal ideation indicated by patient ("Sometimes I feel suicidal. I'm too old to live a life like this. I'm better off dead. I'm a loser.")  Demographic Factors:  NA  Loss Factors: NA  Historical Factors: NA  Risk Reduction Factors:   Positive social support Coping skills Good therapeutic relationship  Continued Clinical Symptoms:  Depression   Cognitive Features That Contribute To Risk:  None  Suicide Risk:  Mild: Suicidal ideation of limited frequency, intensity, duration, and specificity.  There are no identifiable plans, no associated intent, mild dysphoria and related symptoms, good self-control (both objective and subjective assessment), few other risk factors, and identifiable protective factors, including available and accessible social support.   Follow-up Information     [facility]. Go to.   Why: You have a hospital follow up appointment to get connected to med management, substance use treatment and medication management on Tuesday May 28th at 4pm at this location. Contact information: [address],  [place], [place] [address]  (ph) [phone]  [phone]                Plan Of Care/Follow-up recommendations:  Activity as tolerated. Diet as recommended by PCP. Keep all scheduled follow-up appointments as recommended.  Patient is instructed to take all prescribed medications as recommended. Report any side effects or adverse reactions to your outpatient psychiatrist. Patient is instructed to abstain from alcohol and illegal drugs while on prescription medications. In the event of worsening symptoms, patient is instructed to call the crisis hotline, 911, or go to the nearest emergency department for evaluation and  treatment.  Prescriptions given at discharge. Patient agreeable to plan. Given opportunity to ask questions. Appears to feel comfortable with discharge.  Patient is also instructed prior to discharge to: Take all medications as prescribed by mental healthcare provider. Report any adverse effects and or reactions from the medicines to outpatient provider promptly. Patient has been instructed & cautioned: To not engage in alcohol and or illegal drug use while on prescription medicines. In the event of worsening symptoms,  patient is instructed to call the crisis hotline, 911 and or go to the nearest ED for appropriate evaluation and treatment of symptoms. To follow-up with primary care provider for other medical issues, concerns and or health care needs  The patient was evaluated each day by a clinical provider to ascertain response to treatment. Improvement was noted by the patient's report of decreasing symptoms, improved sleep and appetite, affect, medication tolerance, behavior, and participation in unit programming.  Patient was asked each day to complete a self inventory noting mood, mental status, pain, new symptoms, anxiety and concerns.  Patient responded well to medication and being in a therapeutic and supportive environment. Positive and appropriate behavior was noted and the patient was motivated for recovery. The patient worked closely with the treatment team and case manager to develop a discharge plan with appropriate goals. Coping skills, problem solving as well as relaxation therapies were also part of the unit programming.  By the day of discharge patient was in much improved condition than  upon admission.  Symptoms were reported as significantly decreased or resolved completely. The patient was motivated to continue taking medication with a goal of continued improvement in mental health.     Carlyn Reichert, MD PGY-2

## 2022-10-24 NOTE — Telephone Encounter (Signed)
Fax request from [facility] on [address] for patient to have a rx of Lisinopril for 90 days. Per chart he has recently discharged from the [facility] per Dr Weston Brass. I will forward this request to dr Weston Brass who works here but it looks like this patient only has been seen inpatient.

## 2022-10-24 NOTE — Group Note (Signed)
Date:  10/24/2022 Time:  10:38 AM  Group Topic/Focus:  Goals Group:   The focus of this group is to help patients establish daily goals to achieve during treatment and discuss how the patient can incorporate goal setting into their daily lives to aide in recovery. Orientation:   The focus of this group is to educate the patient on the purpose and policies of crisis stabilization and provide a format to answer questions about their admission.  The group details unit policies and expectations of patients while admitted.   Participation Level:  Did Not Attend  Charles Estes W Rozella Servello 10/24/2022, 10:38 AM  

## 2022-10-24 NOTE — Progress Notes (Addendum)
Discharge Note:  Patient denies SI/HI/AVH at this time. Discharge instructions, AVS, prescriptions, and transition recor gone over with patient. Patient agrees to comply with medication management, follow-up visit, and outpatient therapy. Patient belongings returned to patient. Patient questions and concerns addressed and answered. Patient ambulatory off unit. Patient discharged to home via safe transport.

## 2022-10-24 NOTE — Progress Notes (Signed)
  [facility] Adult Case Management Discharge Plan :  Will you be returning to the same living situation after discharge:  No. Pt will go to car and then go to [facility] to stay with sister  At discharge, do you have transportation home?: Yes,  safe transport and then his own transportation Do you have the ability to pay for your medications: Yes,  insurance   Release of information consent forms completed and in the chart;  Patient's signature needed at discharge.  Patient to Follow up at:  Follow-up Information     [facility]. Go to.   Why: You have a hospital follow up appointment to get connected to med management, substance use treatment and medication management on Tuesday May 28th at 4pm at this location. Contact information: [address]  [place], [place] [address]  (ph) [phone]  [phone]                Next level of care provider has access to [facility] Link:no  Safety Planning and Suicide Prevention discussed: Yes,  with patient, patient declined consents to speak with social supports      Has patient been referred to the Quitline?: Patient does not use tobacco/nicotine products  Patient has been referred for addiction treatment: Yes, the patient will follow up with an outpatient provider for substance use disorder. Psychiatrist/APP: appointment made and Therapist: appointment made, [facility]  Beatris Si, LCSW 10/24/2022, 10:31 AM

## 2022-10-24 NOTE — Discharge Summary (Signed)
Physician Discharge Summary Note  Patient:  Charles Estes is an 62 y.o., male MRN:  [id number] DOB:  25-Sep-1960 Patient phone:  [phone] (home)  Patient address:   [address] [place] [address],  Total Time spent with patient: 15 min  Date of Admission:  10/19/2022 Date of Discharge: 10/24/2022  Reason for Admission:   Charles Estes is a 62 year old male with a past psychiatric history of bipolar disorder, stimulant use disorder, GAD with panic attacks, cannabis use disorder, and alcohol use disorder who presented under IVC from [facility] for suicidal and homicidal ideation.   Principal Problem: Severe recurrent major depressive disorder with psychotic features [facility]) Discharge Diagnoses: Principal Problem:   Severe recurrent major depressive disorder with psychotic features (HCC) Active Problems:   Cocaine use disorder (HCC)   Alcohol use   Gastroesophageal reflux disease   Vitamin D deficiency   Essential hypertension   Pulmonary embolism (HCC)   Suicidal thoughts   Homelessness    Past Psychiatric History: See H and P  Past Medical History:  Past Medical History:  Diagnosis Date   Depression    GERD (gastroesophageal reflux disease)    Hypertension    Pathologic ulnar fracture with malunion    right    Past Surgical History:  Procedure Laterality Date   MANDIBLE FRACTURE SURGERY  2014   NO PAST SURGERIES     ORIF ULNAR FRACTURE Right 07/16/2017   Procedure: OPEN REDUCTION INTERNAL FIXATION (ORIF) RIGHT ULNA FRACTURE NONUNION;  Surgeon: Tarry Kos, MD;  Location: [facility];  Service: Orthopedics;  Laterality: Right;   Family History:  Family History  Problem Relation Age of Onset   Hypertension Mother    Heart attack Mother    Hypertension Father    Heart attack Father    Heart attack Sister    Hypertension Sister    Heart attack Sister    Hypertension Brother    Family Psychiatric  History: See H and P Social  History:  Social History   Substance and Sexual Activity  Alcohol Use Yes   Comment: socially- past weekend reports 10 beers     Social History   Substance and Sexual Activity  Drug Use Yes   Types: Cocaine   Comment: occasionally    Social History   Socioeconomic History   Marital status: Divorced    Spouse name: Not on file   Number of children: Not on file   Years of education: 14   Highest education level: Associate degree: occupational, Scientist, product/process development, or vocational program  Occupational History   Not on file  Tobacco Use   Smoking status: Never   Smokeless tobacco: Never  Substance and Sexual Activity   Alcohol use: Yes    Comment: socially- past weekend reports 10 beers   Drug use: Yes    Types: Cocaine    Comment: occasionally   Sexual activity: Yes    Birth control/protection: None  Other Topics Concern   Not on file  Social History Narrative   Not on file   Social Determinants of Health   Financial Resource Strain: Not on file  Food Insecurity: Food Insecurity Present (10/19/2022)   Hunger Vital Sign    Worried About Running Out of Food in the Last Year: Sometimes true    Ran Out of Food in the Last Year: Sometimes true  Transportation Needs: No Transportation Needs (10/19/2022)   PRAPARE - Transportation    Lack of Transportation (  Medical): No    Lack of Transportation (Non-Medical): No  Recent Concern: Transportation Needs - Unmet Transportation Needs (08/26/2022)   PRAPARE - Administrator, Civil Service (Medical): Yes    Lack of Transportation (Non-Medical): Yes  Physical Activity: Not on file  Stress: Not on file  Social Connections: Not on file    Hospital Course:   During the patient's hospitalization, patient had extensive initial psychiatric evaluation, and follow-up psychiatric evaluations every day.   Upon evaluation, psychiatric diagnoses were given as follows:  Major depressive disorder with psychotic features   Patient's  psychiatric medications were adjusted on admission:  Restarted home Abilify 10 mg daily and Depakote direct release 500 mg twice daily   During the hospitalization, other adjustments were made to the patient's psychiatric medication regimen:  Depakote level found to be nontoxic, below 50   Gradually, patient started adjusting to milieu.   Patient's care was discussed during the interdisciplinary team meeting every day during the hospitalization.   The patient denied having side effects to prescribed psychiatric medication.   The patient reports their target psychiatric symptoms of depression and homicidal thoughts responded well to the psychiatric medications, and the patient reports overall benefit other psychiatric hospitalization. Supportive psychotherapy was provided to the patient. The patient also participated in regular group therapy while admitted.    Labs were reviewed with the patient, and abnormal results were discussed with the patient.   The patient denied having suicidal thoughts more than 48 hours prior to discharge.  Patient denies having homicidal thoughts.  Patient denies having auditory hallucinations.  Patient denies any visual hallucinations.  Patient denies having paranoid thoughts.   The patient is able to verbalize their individual safety plan to this provider.   It is recommended to the patient to continue psychiatric medications as prescribed, after discharge from the hospital.     It is recommended to the patient to follow up with your outpatient psychiatric provider and PCP.   Discussed with the patient, the impact of alcohol, drugs, tobacco have been there overall psychiatric and medical wellbeing, and total abstinence from substance use was recommended the patient.  Physical Findings: AIMS:  0  Psychiatric Specialty Exam: Physical Exam Constitutional:      Appearance: the patient is not toxic-appearing.  Pulmonary:     Effort: Pulmonary effort is normal.   Neurological:     General: No focal deficit present.     Mental Status: the patient is alert and oriented to person, place, and time.   Review of Systems  Respiratory:  Negative for shortness of breath.   Cardiovascular:  Negative for chest pain.  Gastrointestinal:  Negative for abdominal pain, constipation, diarrhea, nausea and vomiting.  Neurological:  Negative for headaches.      BP 118/82 (BP Location: Right Arm)   Pulse (!) 105   Temp 98.5 F (36.9 C) (Oral)   Resp 16   Ht 5\' 6"  (1.676 m)   Wt 86 kg   SpO2 100%   BMI 30.60 kg/m   General Appearance: Fairly Groomed  Eye Contact:  Good  Speech:  Clear and Coherent  Volume:  Normal  Mood:  Euthymic  Affect:  Congruent  Thought Process:  Coherent  Orientation:  Full (Time, Place, and Person)  Thought Content: Logical   Suicidal Thoughts:  No  Homicidal Thoughts:  No  Memory:  Immediate;   Good  Judgement:  fair  Insight:  fair  Psychomotor Activity:  Normal  Concentration:  Concentration: Good  Recall:  Good  Fund of Knowledge: Good  Language: Good  Akathisia:  No  Handed:  not assessed  AIMS (if indicated): not done  Assets:  Communication Skills Desire for Improvement Financial Resources/Insurance Housing Leisure Time Physical Health  ADL's:  Intact  Cognition: WNL  Sleep:  Fair      Social History   Tobacco Use  Smoking Status Never  Smokeless Tobacco Never   Tobacco Cessation:  A prescription for an FDA-approved tobacco cessation medication provided at discharge   Blood Alcohol level:  Lab Results  Component Value Date   [facility] <10 10/18/2022   ETH <10 08/23/2022    Metabolic Disorder Labs:  Lab Results  Component Value Date   HGBA1C 6.0 (H) 10/21/2022   MPG 125.5 10/21/2022   MPG 134 08/28/2022   No results found for: "PROLACTIN" Lab Results  Component Value Date   CHOL 185 10/21/2022   TRIG 103 10/21/2022   HDL 32 (L) 10/21/2022   CHOLHDL 5.8 10/21/2022   VLDL 21 10/21/2022    LDLCALC 132 (H) 10/21/2022   LDLCALC 142 (H) 08/23/2022    See Psychiatric Specialty Exam and Suicide Risk Assessment completed by Attending Physician prior to discharge.  Discharge destination: self-care  Is patient on multiple antipsychotic therapies at discharge:  no Has Patient had three or more failed trials of antipsychotic monotherapy by history:  no  Recommended Plan for Multiple Antipsychotic Therapies: NA  Discharge Instructions     Diet - low sodium heart healthy   Complete by: As directed    Increase activity slowly   Complete by: As directed       Allergies as of 10/24/2022       Reactions   Oatmeal Hives   Paxil [paroxetine] Diarrhea, Other (See Comments)   Mouth sores        Medication List     STOP taking these medications    Eliquis DVT/PE Starter Pack Generic drug: Apixaban Starter Pack (10mg  and 5mg ) Replaced by: apixaban 5 MG Tabs tablet       TAKE these medications      Indication  amLODipine 10 MG tablet Commonly known as: NORVASC Take 1 tablet (10 mg total) by mouth daily.  Indication: High Blood Pressure Disorder   apixaban 5 MG Tabs tablet Commonly known as: ELIQUIS Take 2 tablets (10 mg total) by mouth 2 (two) times daily for 1 day. Replaces: Eliquis DVT/PE Starter Pack  Indication: Blockage of Blood Vessel to Lung by a Particle   apixaban 5 MG Tabs tablet Commonly known as: ELIQUIS Take 1 tablet (5 mg total) by mouth 2 (two) times daily. Start taking on: Oct 26, 2022  Indication: Blockage of Blood Vessel to Lung by a Particle   ARIPiprazole 10 MG tablet Commonly known as: ABILIFY Take 1 tablet (10 mg total) by mouth daily.  Indication: Bipolar Disorder   cholecalciferol 25 MCG (1000 UNIT) tablet Commonly known as: VITAMIN D3 Take 5 tablets (5,000 Units total) by mouth daily with breakfast.  Indication: Vitamin D Deficiency   divalproex 500 MG DR tablet Commonly known as: DEPAKOTE Take 1 tablet (500 mg total) by  mouth every 12 (twelve) hours.  Indication: Depressive Phase of Manic-Depression   gabapentin 300 MG capsule Commonly known as: NEURONTIN Take 1 capsule (300 mg total) by mouth at bedtime.  Indication: Neuropathic Pain   lisinopril 10 MG tablet Commonly known as: ZESTRIL Take 1 tablet (10 mg total) by mouth daily. Start taking  on: Oct 25, 2022  Indication: High Blood Pressure Disorder   pantoprazole 40 MG tablet Commonly known as: PROTONIX Take 1 tablet (40 mg total) by mouth daily.  Indication: Gastroesophageal Reflux Disease   traZODone 100 MG tablet Commonly known as: DESYREL Take 1 tablet (100 mg total) by mouth at bedtime as needed for sleep.  Indication: Trouble Sleeping        Follow-up Information     [facility]. Go to.   Why: You have a hospital follow up appointment to get connected to med management, substance use treatment and medication management on Tuesday May 28th at 4pm at this location. Contact information: [address]  [place], [place] [address]  (ph) [phone]  [phone]                Follow-up recommendations:  Activity as tolerated. Diet as recommended by PCP. Keep all scheduled follow-up appointments as recommended.  Patient is instructed to take all prescribed medications as recommended. Report any side effects or adverse reactions to your outpatient psychiatrist. Patient is instructed to abstain from alcohol and illegal drugs while on prescription medications. In the event of worsening symptoms, patient is instructed to call the crisis hotline, 911, or go to the nearest emergency department for evaluation and treatment.  Prescriptions given at discharge. Patient agreeable to plan. Given opportunity to ask questions. Appears to feel comfortable with discharge.  Patient is also instructed prior to discharge to: Take all medications as prescribed by mental healthcare provider. Report any adverse effects and or  reactions from the medicines to outpatient provider promptly. Patient has been instructed & cautioned: To not engage in alcohol and or illegal drug use while on prescription medicines. In the event of worsening symptoms,  patient is instructed to call the crisis hotline, 911 and or go to the nearest ED for appropriate evaluation and treatment of symptoms. To follow-up with primary care provider for other medical issues, concerns and or health care needs  The patient was evaluated each day by a clinical provider to ascertain response to treatment. Improvement was noted by the patient's report of decreasing symptoms, improved sleep and appetite, affect, medication tolerance, behavior, and participation in unit programming.  Patient was asked each day to complete a self inventory noting mood, mental status, pain, new symptoms, anxiety and concerns.  Patient responded well to medication and being in a therapeutic and supportive environment. Positive and appropriate behavior was noted and the patient was motivated for recovery. The patient worked closely with the treatment team and case manager to develop a discharge plan with appropriate goals. Coping skills, problem solving as well as relaxation therapies were also part of the unit programming.  By the day of discharge patient was in much improved condition than upon admission.  Symptoms were reported as significantly decreased or resolved completely. The patient was motivated to continue taking medication with a goal of continued improvement in mental health.    Comments:  As above  Signed: Carlyn Reichert, MD PGY-2

## 2022-10-29 ENCOUNTER — Ambulatory Visit (HOSPITAL_COMMUNITY): Payer: 59 | Admitting: Licensed Clinical Social Worker

## 2022-11-01 LAB — PROTHROMBIN GENE MUTATION

## 2022-11-08 LAB — FACTOR 5 LEIDEN

## 2022-11-19 ENCOUNTER — Other Ambulatory Visit: Payer: Self-pay

## 2022-11-19 ENCOUNTER — Emergency Department (HOSPITAL_COMMUNITY): Payer: 59

## 2022-11-19 ENCOUNTER — Encounter (HOSPITAL_COMMUNITY): Payer: Self-pay

## 2022-11-19 ENCOUNTER — Inpatient Hospital Stay (HOSPITAL_COMMUNITY)
Admission: EM | Admit: 2022-11-19 | Discharge: 2022-11-26 | DRG: 082 | Disposition: A | Discharge status: Home / Self Care | Payer: 59 | Attending: General Surgery | Admitting: General Surgery

## 2022-11-19 DIAGNOSIS — H538 Other visual disturbances: Secondary | ICD-10-CM | POA: Diagnosis present

## 2022-11-19 DIAGNOSIS — E7800 Pure hypercholesterolemia, unspecified: Secondary | ICD-10-CM | POA: Diagnosis present

## 2022-11-19 DIAGNOSIS — M8448XA Pathological fracture, other site, initial encounter for fracture: Secondary | ICD-10-CM | POA: Diagnosis present

## 2022-11-19 DIAGNOSIS — Z8249 Family history of ischemic heart disease and other diseases of the circulatory system: Secondary | ICD-10-CM

## 2022-11-19 DIAGNOSIS — Z91018 Allergy to other foods: Secondary | ICD-10-CM

## 2022-11-19 DIAGNOSIS — M533 Sacrococcygeal disorders, not elsewhere classified: Secondary | ICD-10-CM | POA: Diagnosis present

## 2022-11-19 DIAGNOSIS — H1132 Conjunctival hemorrhage, left eye: Secondary | ICD-10-CM | POA: Diagnosis present

## 2022-11-19 DIAGNOSIS — S0240DA Maxillary fracture, left side, initial encounter for closed fracture: Secondary | ICD-10-CM | POA: Diagnosis present

## 2022-11-19 DIAGNOSIS — E559 Vitamin D deficiency, unspecified: Secondary | ICD-10-CM | POA: Diagnosis present

## 2022-11-19 DIAGNOSIS — F410 Panic disorder [episodic paroxysmal anxiety] without agoraphobia: Secondary | ICD-10-CM | POA: Diagnosis present

## 2022-11-19 DIAGNOSIS — R402253 Coma scale, best verbal response, oriented, at hospital admission: Secondary | ICD-10-CM | POA: Diagnosis present

## 2022-11-19 DIAGNOSIS — R402143 Coma scale, eyes open, spontaneous, at hospital admission: Secondary | ICD-10-CM | POA: Diagnosis present

## 2022-11-19 DIAGNOSIS — R402363 Coma scale, best motor response, obeys commands, at hospital admission: Secondary | ICD-10-CM | POA: Diagnosis present

## 2022-11-19 DIAGNOSIS — S0240FA Zygomatic fracture, left side, initial encounter for closed fracture: Secondary | ICD-10-CM | POA: Diagnosis present

## 2022-11-19 DIAGNOSIS — T45516A Underdosing of anticoagulants, initial encounter: Secondary | ICD-10-CM | POA: Diagnosis present

## 2022-11-19 DIAGNOSIS — Z5982 Transportation insecurity: Secondary | ICD-10-CM | POA: Diagnosis not present

## 2022-11-19 DIAGNOSIS — G894 Chronic pain syndrome: Secondary | ICD-10-CM | POA: Diagnosis present

## 2022-11-19 DIAGNOSIS — Z5902 Unsheltered homelessness: Secondary | ICD-10-CM

## 2022-11-19 DIAGNOSIS — S02621A Fracture of subcondylar process of right mandible, initial encounter for closed fracture: Secondary | ICD-10-CM | POA: Diagnosis present

## 2022-11-19 DIAGNOSIS — S0232XA Fracture of orbital floor, left side, initial encounter for closed fracture: Secondary | ICD-10-CM | POA: Diagnosis present

## 2022-11-19 DIAGNOSIS — H9191 Unspecified hearing loss, right ear: Secondary | ICD-10-CM | POA: Diagnosis present

## 2022-11-19 DIAGNOSIS — F319 Bipolar disorder, unspecified: Secondary | ICD-10-CM | POA: Diagnosis not present

## 2022-11-19 DIAGNOSIS — I2699 Other pulmonary embolism without acute cor pulmonale: Secondary | ICD-10-CM | POA: Diagnosis present

## 2022-11-19 DIAGNOSIS — Z888 Allergy status to other drugs, medicaments and biological substances status: Secondary | ICD-10-CM

## 2022-11-19 DIAGNOSIS — S0231XA Fracture of orbital floor, right side, initial encounter for closed fracture: Secondary | ICD-10-CM | POA: Diagnosis not present

## 2022-11-19 DIAGNOSIS — M5116 Intervertebral disc disorders with radiculopathy, lumbar region: Secondary | ICD-10-CM | POA: Diagnosis present

## 2022-11-19 DIAGNOSIS — I10 Essential (primary) hypertension: Secondary | ICD-10-CM | POA: Diagnosis present

## 2022-11-19 DIAGNOSIS — S02642A Fracture of ramus of left mandible, initial encounter for closed fracture: Secondary | ICD-10-CM | POA: Diagnosis present

## 2022-11-19 DIAGNOSIS — I2609 Other pulmonary embolism with acute cor pulmonale: Secondary | ICD-10-CM | POA: Diagnosis not present

## 2022-11-19 DIAGNOSIS — H5461 Unqualified visual loss, right eye, normal vision left eye: Secondary | ICD-10-CM | POA: Diagnosis present

## 2022-11-19 DIAGNOSIS — M4317 Spondylolisthesis, lumbosacral region: Secondary | ICD-10-CM | POA: Diagnosis present

## 2022-11-19 DIAGNOSIS — S0591XS Unspecified injury of right eye and orbit, sequela: Secondary | ICD-10-CM

## 2022-11-19 DIAGNOSIS — Z7901 Long term (current) use of anticoagulants: Secondary | ICD-10-CM

## 2022-11-19 DIAGNOSIS — F1420 Cocaine dependence, uncomplicated: Secondary | ICD-10-CM | POA: Diagnosis present

## 2022-11-19 DIAGNOSIS — F1424 Cocaine dependence with cocaine-induced mood disorder: Secondary | ICD-10-CM

## 2022-11-19 DIAGNOSIS — M4316 Spondylolisthesis, lumbar region: Secondary | ICD-10-CM | POA: Diagnosis present

## 2022-11-19 DIAGNOSIS — R231 Pallor: Secondary | ICD-10-CM | POA: Diagnosis present

## 2022-11-19 DIAGNOSIS — F411 Generalized anxiety disorder: Secondary | ICD-10-CM | POA: Diagnosis present

## 2022-11-19 DIAGNOSIS — F3181 Bipolar II disorder: Secondary | ICD-10-CM | POA: Diagnosis present

## 2022-11-19 DIAGNOSIS — I6200 Nontraumatic subdural hemorrhage, unspecified: Secondary | ICD-10-CM | POA: Diagnosis present

## 2022-11-19 DIAGNOSIS — M25551 Pain in right hip: Secondary | ICD-10-CM | POA: Diagnosis present

## 2022-11-19 DIAGNOSIS — Z79899 Other long term (current) drug therapy: Secondary | ICD-10-CM

## 2022-11-19 DIAGNOSIS — Z5941 Food insecurity: Secondary | ICD-10-CM

## 2022-11-19 DIAGNOSIS — M25552 Pain in left hip: Secondary | ICD-10-CM | POA: Diagnosis present

## 2022-11-19 DIAGNOSIS — S065XAA Traumatic subdural hemorrhage with loss of consciousness status unknown, initial encounter: Principal | ICD-10-CM | POA: Diagnosis present

## 2022-11-19 DIAGNOSIS — F4310 Post-traumatic stress disorder, unspecified: Secondary | ICD-10-CM | POA: Diagnosis present

## 2022-11-19 DIAGNOSIS — R519 Headache, unspecified: Secondary | ICD-10-CM | POA: Diagnosis present

## 2022-11-19 DIAGNOSIS — K219 Gastro-esophageal reflux disease without esophagitis: Secondary | ICD-10-CM | POA: Diagnosis present

## 2022-11-19 DIAGNOSIS — S0292XA Unspecified fracture of facial bones, initial encounter for closed fracture: Principal | ICD-10-CM

## 2022-11-19 DIAGNOSIS — Z91148 Patient's other noncompliance with medication regimen for other reason: Secondary | ICD-10-CM

## 2022-11-19 DIAGNOSIS — M47817 Spondylosis without myelopathy or radiculopathy, lumbosacral region: Secondary | ICD-10-CM | POA: Diagnosis present

## 2022-11-19 LAB — I-STAT CHEM 8, ED
BUN: 30 mg/dL — ABNORMAL HIGH (ref 6–20)
Calcium, Ion: 1.17 mmol/L (ref 1.15–1.40)
Chloride: 105 mmol/L (ref 98–111)
Creatinine, Ser: 1.6 mg/dL — ABNORMAL HIGH (ref 0.61–1.24)
Glucose, Bld: 106 mg/dL — ABNORMAL HIGH (ref 70–99)
HCT: 42 % (ref 39.0–52.0)
Hemoglobin: 14.3 g/dL (ref 13.0–17.0)
Potassium: 4.5 mmol/L (ref 3.5–5.1)
Sodium: 139 mmol/L (ref 135–145)
TCO2: 29 mmol/L (ref 22–32)

## 2022-11-19 LAB — CBC WITH DIFFERENTIAL/PLATELET
Abs Immature Granulocytes: 0.03 10*3/uL (ref 0.00–0.07)
Basophils Absolute: 0 10*3/uL (ref 0.0–0.1)
Basophils Relative: 0 %
Eosinophils Absolute: 0 10*3/uL (ref 0.0–0.5)
Eosinophils Relative: 0 %
HCT: 42.3 % (ref 39.0–52.0)
Hemoglobin: 14 g/dL (ref 13.0–17.0)
Immature Granulocytes: 0 %
Lymphocytes Relative: 17 %
Lymphs Abs: 1.6 10*3/uL (ref 0.7–4.0)
MCH: 32 pg (ref 26.0–34.0)
MCHC: 33.1 g/dL (ref 30.0–36.0)
MCV: 96.8 fL (ref 80.0–100.0)
Monocytes Absolute: 0.6 10*3/uL (ref 0.1–1.0)
Monocytes Relative: 6 %
Neutro Abs: 7.3 10*3/uL (ref 1.7–7.7)
Neutrophils Relative %: 77 %
Platelets: 260 10*3/uL (ref 150–400)
RBC: 4.37 MIL/uL (ref 4.22–5.81)
RDW: 13.7 % (ref 11.5–15.5)
WBC: 9.7 10*3/uL (ref 4.0–10.5)
nRBC: 0 % (ref 0.0–0.2)

## 2022-11-19 LAB — COMPREHENSIVE METABOLIC PANEL
ALT: 20 U/L (ref 0–44)
AST: 45 U/L — ABNORMAL HIGH (ref 15–41)
Albumin: 3.8 g/dL (ref 3.5–5.0)
Alkaline Phosphatase: 70 U/L (ref 38–126)
Anion gap: 10 (ref 5–15)
BUN: 22 mg/dL — ABNORMAL HIGH (ref 6–20)
CO2: 24 mmol/L (ref 22–32)
Calcium: 9.2 mg/dL (ref 8.9–10.3)
Chloride: 103 mmol/L (ref 98–111)
Creatinine, Ser: 1.45 mg/dL — ABNORMAL HIGH (ref 0.61–1.24)
GFR, Estimated: 56 mL/min — ABNORMAL LOW (ref >60)
Glucose, Bld: 106 mg/dL — ABNORMAL HIGH (ref 70–99)
Potassium: 4.6 mmol/L (ref 3.5–5.1)
Sodium: 137 mmol/L (ref 135–145)
Total Bilirubin: 1.5 mg/dL — ABNORMAL HIGH (ref 0.3–1.2)
Total Protein: 7 g/dL (ref 6.5–8.1)

## 2022-11-19 LAB — BRAIN NATRIURETIC PEPTIDE: B Natriuretic Peptide: 29.5 pg/mL (ref 0.0–100.0)

## 2022-11-19 LAB — MRSA NEXT GEN BY PCR, NASAL: MRSA by PCR Next Gen: NOT DETECTED

## 2022-11-19 LAB — ETHANOL: Alcohol, Ethyl (B): <10 mg/dL (ref <10)

## 2022-11-19 LAB — HIV ANTIBODY (ROUTINE TESTING W REFLEX): HIV Screen 4th Generation wRfx: NONREACTIVE

## 2022-11-19 LAB — TROPONIN I (HIGH SENSITIVITY): Troponin I (High Sensitivity): 475 ng/L (ref <18)

## 2022-11-19 LAB — PROTIME-INR
INR: 1.1 (ref 0.8–1.2)
Prothrombin Time: 14 seconds (ref 11.4–15.2)

## 2022-11-19 MED ORDER — POLYETHYLENE GLYCOL 3350 17 G PO PACK: 17.0000 g | PACK | Freq: Every day | ORAL | Status: DC | PRN

## 2022-11-19 MED ORDER — HYDRALAZINE HCL 20 MG/ML IJ SOLN: 10.0000 mg | INTRAMUSCULAR | Status: DC | PRN

## 2022-11-19 MED ORDER — HYDROMORPHONE HCL 1 MG/ML IJ SOLN
0.5000 mg | INTRAMUSCULAR | Status: DC | PRN
  Filled 2022-11-19: qty 0.5

## 2022-11-19 MED ORDER — LEVETIRACETAM IN NACL 500 MG/100ML IV SOLN
500.0000 mg | Freq: Two times a day (BID) | INTRAVENOUS | Status: DC
  Administered 2022-11-19 – 2022-11-21 (×4): 500 mg via INTRAVENOUS
  Filled 2022-11-19 (×4): qty 100

## 2022-11-19 MED ORDER — ONDANSETRON 4 MG PO TBDP: 4.0000 mg | ORAL_TABLET | Freq: Four times a day (QID) | ORAL | Status: DC | PRN

## 2022-11-19 MED ORDER — CHLORHEXIDINE GLUCONATE CLOTH 2 % EX PADS
6.0000 | MEDICATED_PAD | Freq: Every day | CUTANEOUS | Status: DC
  Administered 2022-11-19 – 2022-11-26 (×8): 6 via TOPICAL

## 2022-11-19 MED ORDER — ONDANSETRON HCL 4 MG/2ML IJ SOLN: 4.0000 mg | Freq: Four times a day (QID) | INTRAMUSCULAR | Status: DC | PRN

## 2022-11-19 MED ORDER — SODIUM CHLORIDE 0.9 % IV SOLN: INTRAVENOUS | Status: DC

## 2022-11-19 MED ORDER — IOHEXOL 350 MG/ML SOLN
75.0000 mL | Freq: Once | INTRAVENOUS | Status: AC | PRN
  Administered 2022-11-19: 75 mL via INTRAVENOUS

## 2022-11-19 MED ORDER — ACETAMINOPHEN 500 MG PO TABS
1000.0000 mg | ORAL_TABLET | Freq: Four times a day (QID) | ORAL | Status: DC
  Administered 2022-11-19 – 2022-11-26 (×22): 1000 mg via ORAL
  Filled 2022-11-19 (×23): qty 2

## 2022-11-19 MED ORDER — METOPROLOL TARTRATE 5 MG/5ML IV SOLN
5.0000 mg | Freq: Four times a day (QID) | INTRAVENOUS | Status: DC | PRN
  Administered 2022-11-21: 5 mg via INTRAVENOUS
  Filled 2022-11-19: qty 5

## 2022-11-19 MED ORDER — OXYCODONE HCL 5 MG PO TABS
5.0000 mg | ORAL_TABLET | ORAL | Status: DC | PRN
  Administered 2022-11-20 – 2022-11-23 (×6): 5 mg via ORAL
  Filled 2022-11-19 (×6): qty 1

## 2022-11-19 MED ORDER — MORPHINE SULFATE (PF) 4 MG/ML IV SOLN
4.0000 mg | Freq: Once | INTRAVENOUS | Status: AC
  Administered 2022-11-19: 4 mg via INTRAVENOUS
  Filled 2022-11-19: qty 1

## 2022-11-19 MED ORDER — DOCUSATE SODIUM 100 MG PO CAPS
100.0000 mg | ORAL_CAPSULE | Freq: Two times a day (BID) | ORAL | Status: DC
  Administered 2022-11-19 – 2022-11-26 (×13): 100 mg via ORAL
  Filled 2022-11-19 (×14): qty 1
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
## 2022-11-19 NOTE — Progress Notes (Addendum)
Dr. Fredricka Bonine notified patient's SBP is 92. MAP of 80. No new orders. RN asked if MD wished to be paged if SBP drops below 90.  Per Dr. Fredricka Bonine, as long as MAP stays appropriate, do not page at this time.
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
## 2022-11-19 NOTE — Progress Notes (Addendum)
Dr. Fredricka Bonine made aware of critical troponin of 475. Patient not currently complaining of chest pain or difficulty breathing. Per Dr. Derrill Memo request, RN did double check to make sure troponin order was due to be drawn again in 2 hours.
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
## 2022-11-20 ENCOUNTER — Inpatient Hospital Stay (HOSPITAL_COMMUNITY): Payer: 59

## 2022-11-20 DIAGNOSIS — I2609 Other pulmonary embolism with acute cor pulmonale: Secondary | ICD-10-CM | POA: Diagnosis not present

## 2022-11-20 LAB — RAPID URINE DRUG SCREEN, HOSP PERFORMED
Amphetamines: NOT DETECTED
Barbiturates: NOT DETECTED
Benzodiazepines: NOT DETECTED
Cocaine: POSITIVE — AB
Opiates: POSITIVE — AB
Tetrahydrocannabinol: NOT DETECTED

## 2022-11-20 LAB — BASIC METABOLIC PANEL
Anion gap: 10 (ref 5–15)
BUN: 19 mg/dL (ref 6–20)
CO2: 22 mmol/L (ref 22–32)
Calcium: 8.6 mg/dL — ABNORMAL LOW (ref 8.9–10.3)
Chloride: 104 mmol/L (ref 98–111)
Creatinine, Ser: 1.38 mg/dL — ABNORMAL HIGH (ref 0.61–1.24)
GFR, Estimated: 59 mL/min — ABNORMAL LOW (ref >60)
Glucose, Bld: 141 mg/dL — ABNORMAL HIGH (ref 70–99)
Potassium: 3.5 mmol/L (ref 3.5–5.1)
Sodium: 136 mmol/L (ref 135–145)

## 2022-11-20 LAB — ECHOCARDIOGRAM COMPLETE
Calc EF: 57.5 %
MV VTI: 2.74 cm2
S' Lateral: 2.1 cm
Single Plane A2C EF: 61.4 %
Single Plane A4C EF: 51.7 %
Weight: 3033.53 oz

## 2022-11-20 LAB — CBC
HCT: 38.5 % — ABNORMAL LOW (ref 39.0–52.0)
Hemoglobin: 12.7 g/dL — ABNORMAL LOW (ref 13.0–17.0)
MCH: 31.6 pg (ref 26.0–34.0)
MCHC: 33 g/dL (ref 30.0–36.0)
MCV: 95.8 fL (ref 80.0–100.0)
Platelets: 227 10*3/uL (ref 150–400)
RBC: 4.02 MIL/uL — ABNORMAL LOW (ref 4.22–5.81)
RDW: 13.6 % (ref 11.5–15.5)
WBC: 9.8 10*3/uL (ref 4.0–10.5)
nRBC: 0 % (ref 0.0–0.2)

## 2022-11-20 LAB — HEPARIN LEVEL (UNFRACTIONATED)
Heparin Unfractionated: 0.1 IU/mL — ABNORMAL LOW (ref 0.30–0.70)
Heparin Unfractionated: 0.28 IU/mL — ABNORMAL LOW (ref 0.30–0.70)

## 2022-11-20 LAB — TROPONIN I (HIGH SENSITIVITY): Troponin I (High Sensitivity): 768 ng/L (ref <18)

## 2022-11-20 MED ORDER — HEPARIN (PORCINE) 25000 UT/250ML-% IV SOLN
1300.0000 [IU]/h | INTRAVENOUS | Status: AC
  Administered 2022-11-20: 1300 [IU]/h via INTRAVENOUS
  Administered 2022-11-20: 1100 [IU]/h via INTRAVENOUS
  Filled 2022-11-20 (×2): qty 250

## 2022-11-20 MED ORDER — CHLORHEXIDINE GLUCONATE 0.12 % MT SOLN
15.0000 mL | Freq: Three times a day (TID) | OROMUCOSAL | Status: DC
  Administered 2022-11-20 – 2022-11-26 (×26): 15 mL via OROMUCOSAL
  Filled 2022-11-20 (×22): qty 15

## 2022-11-20 MED ORDER — ORAL CARE MOUTH RINSE: 15.0000 mL | OROMUCOSAL | Status: DC | PRN

## 2022-11-20 NOTE — Progress Notes (Signed)
RN called phlebotomy and asked them to please come draw troponin lab that is due. Phlebotomist said they would come draw it.
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
## 2022-11-20 NOTE — TOC CAGE-AID Note (Signed)
Transition of Care [facility]) - CAGE-AID Screening   Patient Details  Name: Charles Estes MRN: [id number] Date of Birth: 1961-03-25  Transition of Care [facility]) CM/SW Contact:    Leota Sauers, RN Phone Number: 11/20/2022, 5:56 AM   Clinical Narrative:  Patient endorses social alcohol use and cocaine use, refuses education at this time.   CAGE-AID Screening:    Have You Ever Felt You Ought to Cut Down on Your Drinking or Drug Use?: No Have People Annoyed You By Critizing Your Drinking Or Drug Use?: No Have You Felt Bad Or Guilty About Your Drinking Or Drug Use?: No Have You Ever Had a Drink or Used Drugs First Thing In The Morning to Steady Your Nerves or to Get Rid of a Hangover?: No CAGE-AID Score: 0  Substance Abuse Education Offered: No

## 2022-11-20 NOTE — Progress Notes (Signed)
  Echocardiogram 2D Echocardiogram has been performed.  Milda Smart 11/20/2022, 10:06 AM
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
## 2022-11-20 NOTE — Progress Notes (Signed)
Head CT completed. Dr. Fredricka Bonine made aware. Per Dr. Fredricka Bonine, head CT stable, okay to begin heparin infusion. Pharmacy notified. Dr. Fredricka Bonine also made aware of repeat troponin of 768. No new orders. Dr. Fredricka Bonine made aware that troponin lab was for two occurrences, and RN asked if MD wanted any more troponin labs. Dr. Fredricka Bonine stated no more troponin labs for the time being.
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
## 2022-11-20 NOTE — Progress Notes (Signed)
I have reviewed patient's repeat CT head which appears stable to improved compared to initial scan 8 hours ago.  No new neurologic deficit or complaint of chest pain/respiratory complaints at this point; patient has been stably mildly hypotensive with systolic blood pressures in the 90s, mildly tachycardic in the 90s- low 100s and saturating well on room air to 2 L nasal cannula. Troponins elevated and rising (475-768); BNP was within normal limits at 29.5.  Echo pending.  Given stable to improved head CT will proceed with therapeutic anticoagulation with heparin drip.  I discussed with pharmacy earlier this evening that we will start without bolus and will start by targeting the lower end of the therapeutic range.  I think the benefit outweighs the risk in the setting of submassive PE with right heart strain and resultant rising troponins/borderline blood pressure.  Will continue to follow his neurological exam closely in the ICU.  Berna Bue MD FACS

## 2022-11-21 ENCOUNTER — Inpatient Hospital Stay (HOSPITAL_COMMUNITY): Payer: 59

## 2022-11-21 ENCOUNTER — Other Ambulatory Visit (HOSPITAL_COMMUNITY): Payer: Self-pay

## 2022-11-21 LAB — CBC
HCT: 34.8 % — ABNORMAL LOW (ref 39.0–52.0)
Hemoglobin: 11.4 g/dL — ABNORMAL LOW (ref 13.0–17.0)
MCH: 31 pg (ref 26.0–34.0)
MCHC: 32.8 g/dL (ref 30.0–36.0)
MCV: 94.6 fL (ref 80.0–100.0)
Platelets: 205 10*3/uL (ref 150–400)
RBC: 3.68 MIL/uL — ABNORMAL LOW (ref 4.22–5.81)
RDW: 13.3 % (ref 11.5–15.5)
WBC: 7 10*3/uL (ref 4.0–10.5)
nRBC: 0 % (ref 0.0–0.2)

## 2022-11-21 LAB — HEPARIN LEVEL (UNFRACTIONATED)
Heparin Unfractionated: 0.4 IU/mL (ref 0.30–0.70)
Heparin Unfractionated: 0.44 IU/mL (ref 0.30–0.70)

## 2022-11-21 MED ORDER — AMLODIPINE BESYLATE 10 MG PO TABS
10.0000 mg | ORAL_TABLET | Freq: Every day | ORAL | Status: DC
  Administered 2022-11-21 – 2022-11-26 (×6): 10 mg via ORAL
  Filled 2022-11-21 (×6): qty 1

## 2022-11-21 MED ORDER — GABAPENTIN 300 MG PO CAPS
300.0000 mg | ORAL_CAPSULE | Freq: Every day | ORAL | Status: DC
  Administered 2022-11-21 – 2022-11-25 (×5): 300 mg via ORAL
  Filled 2022-11-21 (×5): qty 1

## 2022-11-21 MED ORDER — APIXABAN 5 MG PO TABS
5.0000 mg | ORAL_TABLET | Freq: Two times a day (BID) | ORAL | Status: DC
  Administered 2022-11-21 – 2022-11-26 (×11): 5 mg via ORAL
  Filled 2022-11-21 (×11): qty 1

## 2022-11-21 MED ORDER — PANTOPRAZOLE SODIUM 40 MG PO TBEC
40.0000 mg | DELAYED_RELEASE_TABLET | Freq: Every day | ORAL | Status: DC
  Administered 2022-11-21 – 2022-11-26 (×6): 40 mg via ORAL
  Filled 2022-11-21 (×6): qty 1

## 2022-11-21 MED ORDER — LEVETIRACETAM 500 MG PO TABS
500.0000 mg | ORAL_TABLET | Freq: Two times a day (BID) | ORAL | Status: DC
  Administered 2022-11-21 – 2022-11-26 (×10): 500 mg via ORAL
  Filled 2022-11-21 (×10): qty 1

## 2022-11-21 MED ORDER — DIVALPROEX SODIUM 500 MG PO DR TAB
500.0000 mg | DELAYED_RELEASE_TABLET | Freq: Two times a day (BID) | ORAL | Status: DC
  Administered 2022-11-21 – 2022-11-26 (×11): 500 mg via ORAL
  Filled 2022-11-21 (×11): qty 1

## 2022-11-21 NOTE — Progress Notes (Signed)
D/w Ophtho, Dr. [name], who will come see the patient this afternoon.   Diamantina Monks, MD General and Trauma Surgery [facility] Surgery
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
## 2022-11-21 NOTE — Discharge Instructions (Addendum)
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
## 2022-11-21 NOTE — Progress Notes (Signed)
D/w NSGY, repeat head CT now that he is therapeutic on heparin gtt. Okay to transition to tx-ic LMWH/DOAC if head CT stable.   Diamantina Monks, MD General and Trauma Surgery [facility]

## 2022-11-21 NOTE — TOC Benefit Eligibility Note (Signed)
Pharmacy Patient Advocate Encounter  Insurance verification completed.    The patient is insured through [employer] test claim for Eliquis and the current 30 day co-pay is $0.00.  Ran test claim for Xarelto and the current 30 day co-pay is $0.00.  This test claim was processed through [facility]- copay amounts may vary at other pharmacies due to pharmacy/plan contracts, or as the patient moves through the different stages of their insurance plan.

## 2022-11-22 LAB — CBC
HCT: 36.6 % — ABNORMAL LOW (ref 39.0–52.0)
Hemoglobin: 12.2 g/dL — ABNORMAL LOW (ref 13.0–17.0)
MCH: 30.7 pg (ref 26.0–34.0)
MCHC: 33.3 g/dL (ref 30.0–36.0)
MCV: 92.2 fL (ref 80.0–100.0)
Platelets: 246 10*3/uL (ref 150–400)
RBC: 3.97 MIL/uL — ABNORMAL LOW (ref 4.22–5.81)
RDW: 13 % (ref 11.5–15.5)
WBC: 8.3 10*3/uL (ref 4.0–10.5)
nRBC: 0 % (ref 0.0–0.2)

## 2022-11-22 NOTE — Plan of Care (Signed)
  Problem: Education: Goal: Knowledge of General Education information will improve Description: Including pain rating scale, medication(s)/side effects and non-pharmacologic comfort measures Outcome: Adequate for Discharge   Problem: Health Behavior/Discharge Planning: Goal: Ability to manage health-related needs will improve Outcome: Adequate for Discharge   Problem: Clinical Measurements: Goal: Ability to maintain clinical measurements within normal limits will improve Outcome: Adequate for Discharge Goal: Will remain free from infection Outcome: Adequate for Discharge Goal: Diagnostic test results will improve Outcome: Adequate for Discharge Goal: Respiratory complications will improve Outcome: Adequate for Discharge Goal: Cardiovascular complication will be avoided Outcome: Adequate for Discharge   Problem: Activity: Goal: Risk for activity intolerance will decrease Outcome: Adequate for Discharge   Problem: Coping: Goal: Level of anxiety will decrease Outcome: Adequate for Discharge   Problem: Elimination: Goal: Will not experience complications related to bowel motility Outcome: Adequate for Discharge Goal: Will not experience complications related to urinary retention Outcome: Adequate for Discharge   Problem: Safety: Goal: Ability to remain free from injury will improve Outcome: Adequate for Discharge   Problem: Skin Integrity: Goal: Risk for impaired skin integrity will decrease Outcome: Adequate for Discharge   
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
## 2022-11-22 NOTE — Care Management Important Message (Signed)
Important Message  Patient Details  Name: Dmarius Reeder MRN: [id number] Date of Birth: 17-Jun-1960   Medicare Important Message Given:  Yes     Sherilyn Banker 11/22/2022, 12:34 PM

## 2022-11-23 MED ORDER — ALUM & MAG HYDROXIDE-SIMETH 200-200-20 MG/5ML PO SUSP
15.0000 mL | ORAL | Status: DC | PRN
  Administered 2022-11-23 – 2022-11-24 (×2): 15 mL via ORAL
  Filled 2022-11-23 (×2): qty 30
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
## 2022-11-25 NOTE — Care Management Important Message (Signed)
Important Message  Patient Details  Name: Charles Estes MRN: [id number] Date of Birth: [birth year]   Medicare Important Message Given:  Yes     Sherilyn Banker 11/25/2022, 2:27 PM
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
## 2022-11-25 NOTE — Progress Notes (Signed)
Food insecurity/ resource guide added to AVS  Quintella Baton, RN, BSN  Trauma/Neuro ICU Case Manager [phone]
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
## 2022-11-26 ENCOUNTER — Other Ambulatory Visit (HOSPITAL_COMMUNITY): Payer: Self-pay

## 2022-11-26 DIAGNOSIS — F1424 Cocaine dependence with cocaine-induced mood disorder: Secondary | ICD-10-CM

## 2022-11-26 DIAGNOSIS — F319 Bipolar disorder, unspecified: Secondary | ICD-10-CM

## 2022-11-26 MED ORDER — APIXABAN 5 MG PO TABS: 5.0000 mg | ORAL_TABLET | Freq: Two times a day (BID) | ORAL | 0 refills | Status: DC

## 2022-11-26 MED ORDER — ACETAMINOPHEN 500 MG PO TABS: 1000.0000 mg | ORAL_TABLET | Freq: Three times a day (TID) | ORAL | Status: DC | PRN

## 2022-11-26 MED ORDER — LEVETIRACETAM 500 MG PO TABS
500.0000 mg | ORAL_TABLET | Freq: Two times a day (BID) | ORAL | 0 refills | Status: DC
  Filled 2022-11-26: qty 2, 1d supply, fill #0

## 2022-11-26 MED ORDER — GABAPENTIN 300 MG PO CAPS: 300.0000 mg | ORAL_CAPSULE | Freq: Every day | ORAL | 0 refills | Status: DC

## 2022-11-26 MED ORDER — ARIPIPRAZOLE 10 MG PO TABS
10.0000 mg | ORAL_TABLET | Freq: Every day | ORAL | 0 refills | Status: DC
  Filled 2022-11-26: qty 30, 30d supply, fill #0

## 2022-11-26 MED ORDER — LEVETIRACETAM 500 MG PO TABS: 500.0000 mg | ORAL_TABLET | Freq: Two times a day (BID) | ORAL | 0 refills | Status: DC

## 2022-11-26 MED ORDER — ARIPIPRAZOLE 10 MG PO TABS: 10.0000 mg | ORAL_TABLET | Freq: Every day | ORAL | 0 refills | Status: DC

## 2022-11-26 MED ORDER — ARIPIPRAZOLE 10 MG PO TABS
10.0000 mg | ORAL_TABLET | Freq: Every day | ORAL | Status: DC
  Administered 2022-11-26: 10 mg via ORAL
  Filled 2022-11-26 (×2): qty 1

## 2022-11-26 MED ORDER — APIXABAN 5 MG PO TABS
5.0000 mg | ORAL_TABLET | Freq: Two times a day (BID) | ORAL | 0 refills | Status: DC
  Filled 2022-11-26: qty 60, 30d supply, fill #0

## 2022-11-26 MED ORDER — GABAPENTIN 300 MG PO CAPS
300.0000 mg | ORAL_CAPSULE | Freq: Every day | ORAL | 0 refills | Status: DC
  Filled 2022-11-26: qty 30, 30d supply, fill #0

## 2022-11-26 NOTE — Plan of Care (Signed)
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
## 2022-11-26 NOTE — TOC Transition Note (Signed)
Transition of Care [facility]) - CM/SW Discharge Note   Patient Details  Name: [name] MRN: [id number] Date of Birth: Sep 23, 1960  Transition of Care [facility]) CM/SW Contact:  Glennon Mac, RN Phone Number: 11/26/2022, 3:43 PM   Clinical Narrative:    Patient medically stable for discharge, and has been cleared by psychiatry.  He will discharge to his daughter's home today; cab voucher given to patient's nurse.  She will call cab company when patient ready for dc.  Rolling walker has been delivered to room.  Provided housing resources for patient/daughter to assist with finding patient a place to live.  Will refer to [facility] for OP physical therapy.     Final next level of care: OP Rehab Barriers to Discharge: Barriers Resolved                           Discharge Plan and Services Additional resources added to the After Visit Summary for  Food insecurity, housing resources   Discharge Planning Services: CM Consult            DME Arranged: Dan Humphreys rolling DME Agency: [employer] Date DME Agency Contacted: 11/25/22 Time DME Agency Contacted: [phone] Representative spoke with at DME Agency: Mitch            Social Determinants of Health (SDOH) Interventions SDOH Screenings   Food Insecurity: Food Insecurity Present (10/19/2022)  Housing: Medium Risk (10/19/2022)  Transportation Needs: No Transportation Needs (10/19/2022)  Recent Concern: Transportation Needs - Unmet Transportation Needs (08/26/2022)  Utilities: Not At Risk (10/19/2022)  Recent Concern: Utilities - At Risk (08/26/2022)  Alcohol Screen: Low Risk  (08/26/2022)  Depression (PHQ2-9): Medium Risk (08/24/2022)  Tobacco Use: Low Risk  (11/19/2022)     Readmission Risk Interventions     No data to display         Quintella Baton, RN, BSN  Trauma/Neuro ICU Case Manager [phone]
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
## 2022-11-27 DIAGNOSIS — F1424 Cocaine dependence with cocaine-induced mood disorder: Secondary | ICD-10-CM

## 2022-12-10 ENCOUNTER — Ambulatory Visit: Payer: 59 | Admitting: Student

## 2022-12-11 ENCOUNTER — Ambulatory Visit: Payer: 59 | Admitting: Student

## 2022-12-15 ENCOUNTER — Emergency Department (HOSPITAL_COMMUNITY): Payer: 59

## 2022-12-15 ENCOUNTER — Inpatient Hospital Stay
Admission: RE | Admit: 2022-12-15 | Discharge: 2022-12-19 | DRG: 885 | Disposition: A | Discharge status: Home / Self Care | Payer: 59 | Source: Intra-hospital | Attending: Psychiatry | Admitting: Psychiatry

## 2022-12-15 ENCOUNTER — Encounter: Payer: Self-pay | Admitting: Psychiatry

## 2022-12-15 ENCOUNTER — Other Ambulatory Visit: Payer: Self-pay

## 2022-12-15 ENCOUNTER — Emergency Department (HOSPITAL_COMMUNITY)
Admission: EM | Admit: 2022-12-15 | Discharge: 2022-12-15 | Disposition: A | Discharge status: Other Acute Inpatient Hospital | Payer: 59 | Source: Home / Self Care | Attending: Emergency Medicine | Admitting: Emergency Medicine

## 2022-12-15 ENCOUNTER — Encounter (HOSPITAL_COMMUNITY): Payer: Self-pay | Admitting: Emergency Medicine

## 2022-12-15 DIAGNOSIS — Z9151 Personal history of suicidal behavior: Secondary | ICD-10-CM

## 2022-12-15 DIAGNOSIS — I10 Essential (primary) hypertension: Secondary | ICD-10-CM | POA: Diagnosis not present

## 2022-12-15 DIAGNOSIS — F32A Depression, unspecified: Secondary | ICD-10-CM | POA: Diagnosis not present

## 2022-12-15 DIAGNOSIS — Z5900 Homelessness unspecified: Secondary | ICD-10-CM | POA: Diagnosis not present

## 2022-12-15 DIAGNOSIS — F419 Anxiety disorder, unspecified: Secondary | ICD-10-CM | POA: Diagnosis not present

## 2022-12-15 DIAGNOSIS — F333 Major depressive disorder, recurrent, severe with psychotic symptoms: Secondary | ICD-10-CM | POA: Diagnosis present

## 2022-12-15 DIAGNOSIS — F1490 Cocaine use, unspecified, uncomplicated: Secondary | ICD-10-CM | POA: Diagnosis present

## 2022-12-15 DIAGNOSIS — M545 Low back pain, unspecified: Secondary | ICD-10-CM | POA: Diagnosis not present

## 2022-12-15 DIAGNOSIS — F332 Major depressive disorder, recurrent severe without psychotic features: Principal | ICD-10-CM | POA: Diagnosis present

## 2022-12-15 DIAGNOSIS — R441 Visual hallucinations: Secondary | ICD-10-CM | POA: Insufficient documentation

## 2022-12-15 DIAGNOSIS — Z7901 Long term (current) use of anticoagulants: Secondary | ICD-10-CM

## 2022-12-15 DIAGNOSIS — G4700 Insomnia, unspecified: Secondary | ICD-10-CM | POA: Insufficient documentation

## 2022-12-15 DIAGNOSIS — Z5986 Financial insecurity: Secondary | ICD-10-CM

## 2022-12-15 DIAGNOSIS — R079 Chest pain, unspecified: Secondary | ICD-10-CM | POA: Diagnosis not present

## 2022-12-15 DIAGNOSIS — Z5982 Transportation insecurity: Secondary | ICD-10-CM

## 2022-12-15 DIAGNOSIS — M791 Myalgia, unspecified site: Secondary | ICD-10-CM | POA: Insufficient documentation

## 2022-12-15 DIAGNOSIS — R45851 Suicidal ideations: Secondary | ICD-10-CM | POA: Diagnosis not present

## 2022-12-15 DIAGNOSIS — F1410 Cocaine abuse, uncomplicated: Secondary | ICD-10-CM | POA: Diagnosis present

## 2022-12-15 DIAGNOSIS — Z79899 Other long term (current) drug therapy: Secondary | ICD-10-CM | POA: Diagnosis not present

## 2022-12-15 DIAGNOSIS — F17210 Nicotine dependence, cigarettes, uncomplicated: Secondary | ICD-10-CM | POA: Diagnosis present

## 2022-12-15 DIAGNOSIS — Z560 Unemployment, unspecified: Secondary | ICD-10-CM

## 2022-12-15 DIAGNOSIS — R440 Auditory hallucinations: Secondary | ICD-10-CM | POA: Diagnosis not present

## 2022-12-15 DIAGNOSIS — R45850 Homicidal ideations: Secondary | ICD-10-CM | POA: Diagnosis present

## 2022-12-15 DIAGNOSIS — Z5941 Food insecurity: Secondary | ICD-10-CM | POA: Diagnosis not present

## 2022-12-15 DIAGNOSIS — K219 Gastro-esophageal reflux disease without esophagitis: Secondary | ICD-10-CM | POA: Diagnosis present

## 2022-12-15 DIAGNOSIS — Z8249 Family history of ischemic heart disease and other diseases of the circulatory system: Secondary | ICD-10-CM

## 2022-12-15 LAB — BASIC METABOLIC PANEL
Anion gap: 16 — ABNORMAL HIGH (ref 5–15)
BUN: 10 mg/dL (ref 6–20)
CO2: 23 mmol/L (ref 22–32)
Calcium: 9.4 mg/dL (ref 8.9–10.3)
Chloride: 100 mmol/L (ref 98–111)
Creatinine, Ser: 1.15 mg/dL (ref 0.61–1.24)
GFR, Estimated: >60 mL/min (ref >60)
Glucose, Bld: 99 mg/dL (ref 70–99)
Potassium: 3.5 mmol/L (ref 3.5–5.1)
Sodium: 139 mmol/L (ref 135–145)

## 2022-12-15 LAB — RAPID URINE DRUG SCREEN, HOSP PERFORMED
Amphetamines: NOT DETECTED
Barbiturates: NOT DETECTED
Benzodiazepines: NOT DETECTED
Cocaine: POSITIVE — AB
Opiates: NOT DETECTED
Tetrahydrocannabinol: POSITIVE — AB

## 2022-12-15 LAB — CBC
HCT: 37 % — ABNORMAL LOW (ref 39.0–52.0)
Hemoglobin: 12.1 g/dL — ABNORMAL LOW (ref 13.0–17.0)
MCH: 30.5 pg (ref 26.0–34.0)
MCHC: 32.7 g/dL (ref 30.0–36.0)
MCV: 93.2 fL (ref 80.0–100.0)
Platelets: 269 10*3/uL (ref 150–400)
RBC: 3.97 MIL/uL — ABNORMAL LOW (ref 4.22–5.81)
RDW: 14.2 % (ref 11.5–15.5)
WBC: 7.3 10*3/uL (ref 4.0–10.5)
nRBC: 0 % (ref 0.0–0.2)

## 2022-12-15 LAB — TROPONIN I (HIGH SENSITIVITY)
Troponin I (High Sensitivity): 8 ng/L (ref <18)
Troponin I (High Sensitivity): 8 ng/L (ref <18)

## 2022-12-15 LAB — ACETAMINOPHEN LEVEL: Acetaminophen (Tylenol), Serum: <10 ug/mL — ABNORMAL LOW (ref 10–30)

## 2022-12-15 LAB — SALICYLATE LEVEL: Salicylate Lvl: <7 mg/dL — ABNORMAL LOW (ref 7.0–30.0)

## 2022-12-15 LAB — ETHANOL: Alcohol, Ethyl (B): <10 mg/dL (ref <10)

## 2022-12-15 MED ORDER — ALUM & MAG HYDROXIDE-SIMETH 200-200-20 MG/5ML PO SUSP: 30.0000 mL | ORAL | Status: DC | PRN

## 2022-12-15 MED ORDER — DIPHENHYDRAMINE HCL 50 MG/ML IJ SOLN: 50.0000 mg | Freq: Four times a day (QID) | INTRAMUSCULAR | Status: DC | PRN

## 2022-12-15 MED ORDER — HALOPERIDOL 5 MG PO TABS: 5.0000 mg | ORAL_TABLET | Freq: Three times a day (TID) | ORAL | Status: DC | PRN

## 2022-12-15 MED ORDER — DIPHENHYDRAMINE HCL 50 MG/ML IJ SOLN: 50.0000 mg | Freq: Three times a day (TID) | INTRAMUSCULAR | Status: DC | PRN

## 2022-12-15 MED ORDER — ACETAMINOPHEN 500 MG PO TABS: 1000.0000 mg | ORAL_TABLET | Freq: Three times a day (TID) | ORAL | Status: DC | PRN

## 2022-12-15 MED ORDER — GABAPENTIN 300 MG PO CAPS
300.0000 mg | ORAL_CAPSULE | Freq: Every day | ORAL | Status: DC
  Administered 2022-12-16 – 2022-12-18 (×3): 300 mg via ORAL
  Filled 2022-12-15 (×3): qty 1

## 2022-12-15 MED ORDER — ACETAMINOPHEN 325 MG PO TABS: 650.0000 mg | ORAL_TABLET | Freq: Four times a day (QID) | ORAL | Status: DC | PRN

## 2022-12-15 MED ORDER — HYDROXYZINE HCL 25 MG PO TABS
25.0000 mg | ORAL_TABLET | Freq: Three times a day (TID) | ORAL | Status: DC | PRN
  Administered 2022-12-18: 25 mg via ORAL
  Filled 2022-12-15: qty 1

## 2022-12-15 MED ORDER — LORAZEPAM 2 MG PO TABS: 2.0000 mg | ORAL_TABLET | Freq: Three times a day (TID) | ORAL | Status: DC | PRN

## 2022-12-15 MED ORDER — LORAZEPAM 2 MG/ML IJ SOLN: 2.0000 mg | Freq: Four times a day (QID) | INTRAMUSCULAR | Status: DC | PRN

## 2022-12-15 MED ORDER — PANTOPRAZOLE SODIUM 40 MG PO TBEC
40.0000 mg | DELAYED_RELEASE_TABLET | Freq: Every day | ORAL | Status: DC
  Administered 2022-12-16 – 2022-12-19 (×4): 40 mg via ORAL
  Filled 2022-12-15 (×4): qty 1

## 2022-12-15 MED ORDER — ZIPRASIDONE MESYLATE 20 MG IM SOLR
20.0000 mg | Freq: Once | INTRAMUSCULAR | Status: DC
  Filled 2022-12-15: qty 20

## 2022-12-15 MED ORDER — ARIPIPRAZOLE 10 MG PO TABS
10.0000 mg | ORAL_TABLET | Freq: Every day | ORAL | Status: DC
  Administered 2022-12-16 – 2022-12-19 (×4): 10 mg via ORAL
  Filled 2022-12-15 (×5): qty 1

## 2022-12-15 MED ORDER — DIPHENHYDRAMINE HCL 25 MG PO CAPS: 50.0000 mg | ORAL_CAPSULE | Freq: Three times a day (TID) | ORAL | Status: DC | PRN

## 2022-12-15 MED ORDER — HALOPERIDOL LACTATE 5 MG/ML IJ SOLN: 5.0000 mg | Freq: Four times a day (QID) | INTRAMUSCULAR | Status: DC | PRN

## 2022-12-15 MED ORDER — HALOPERIDOL 5 MG PO TABS
5.0000 mg | ORAL_TABLET | Freq: Four times a day (QID) | ORAL | Status: DC | PRN
  Filled 2022-12-15: qty 1

## 2022-12-15 MED ORDER — LEVETIRACETAM 500 MG PO TABS
500.0000 mg | ORAL_TABLET | Freq: Two times a day (BID) | ORAL | Status: DC
  Administered 2022-12-16 – 2022-12-17 (×2): 500 mg via ORAL
  Filled 2022-12-15 (×5): qty 1

## 2022-12-15 MED ORDER — AMLODIPINE BESYLATE 5 MG PO TABS
10.0000 mg | ORAL_TABLET | Freq: Every day | ORAL | Status: DC
  Administered 2022-12-16 – 2022-12-19 (×4): 10 mg via ORAL
  Filled 2022-12-15 (×4): qty 2

## 2022-12-15 MED ORDER — PANTOPRAZOLE SODIUM 40 MG PO TBEC
40.0000 mg | DELAYED_RELEASE_TABLET | Freq: Every day | ORAL | Status: DC
  Administered 2022-12-15: 40 mg via ORAL
  Filled 2022-12-15: qty 1

## 2022-12-15 MED ORDER — DIPHENHYDRAMINE HCL 25 MG PO CAPS
50.0000 mg | ORAL_CAPSULE | Freq: Four times a day (QID) | ORAL | Status: DC | PRN
  Filled 2022-12-15: qty 2

## 2022-12-15 MED ORDER — ARIPIPRAZOLE 10 MG PO TABS: 10.0000 mg | ORAL_TABLET | Freq: Every day | ORAL | Status: DC

## 2022-12-15 MED ORDER — MAGNESIUM HYDROXIDE 400 MG/5ML PO SUSP: 30.0000 mL | Freq: Every day | ORAL | Status: DC | PRN

## 2022-12-15 MED ORDER — NICOTINE 14 MG/24HR TD PT24
14.0000 mg | MEDICATED_PATCH | Freq: Every day | TRANSDERMAL | Status: DC
  Filled 2022-12-15 (×2): qty 1

## 2022-12-15 MED ORDER — LORAZEPAM 2 MG/ML IJ SOLN: 2.0000 mg | Freq: Three times a day (TID) | INTRAMUSCULAR | Status: DC | PRN

## 2022-12-15 MED ORDER — LORAZEPAM 1 MG PO TABS
2.0000 mg | ORAL_TABLET | Freq: Four times a day (QID) | ORAL | Status: DC | PRN
  Filled 2022-12-15: qty 2

## 2022-12-15 MED ORDER — APIXABAN 5 MG PO TABS
5.0000 mg | ORAL_TABLET | Freq: Two times a day (BID) | ORAL | Status: DC
  Administered 2022-12-15: 5 mg via ORAL
  Filled 2022-12-15: qty 1

## 2022-12-15 MED ORDER — APIXABAN 5 MG PO TABS
5.0000 mg | ORAL_TABLET | Freq: Two times a day (BID) | ORAL | Status: DC
  Administered 2022-12-16 – 2022-12-19 (×7): 5 mg via ORAL
  Filled 2022-12-15 (×9): qty 1

## 2022-12-15 MED ORDER — AMLODIPINE BESYLATE 5 MG PO TABS
10.0000 mg | ORAL_TABLET | Freq: Every day | ORAL | Status: DC
  Administered 2022-12-15: 10 mg via ORAL
  Filled 2022-12-15: qty 2

## 2022-12-15 MED ORDER — TRAZODONE HCL 50 MG PO TABS: 50.0000 mg | ORAL_TABLET | Freq: Every evening | ORAL | Status: DC | PRN

## 2022-12-15 MED ORDER — STERILE WATER FOR INJECTION IJ SOLN
INTRAMUSCULAR | Status: AC
  Filled 2022-12-15: qty 10

## 2022-12-15 MED ORDER — LEVETIRACETAM 500 MG PO TABS
500.0000 mg | ORAL_TABLET | Freq: Two times a day (BID) | ORAL | Status: DC
  Administered 2022-12-15: 500 mg via ORAL
  Filled 2022-12-15: qty 1

## 2022-12-15 MED ORDER — HALOPERIDOL LACTATE 5 MG/ML IJ SOLN: 5.0000 mg | Freq: Three times a day (TID) | INTRAMUSCULAR | Status: DC | PRN

## 2022-12-15 MED ORDER — GABAPENTIN 300 MG PO CAPS: 300.0000 mg | ORAL_CAPSULE | Freq: Every day | ORAL | Status: DC

## 2022-12-15 NOTE — ED Notes (Signed)
IVC Paperwork completed and at the orange nurse station. Expires 12/21/2022
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
## 2022-12-15 NOTE — ED Triage Notes (Signed)
Pt c/o central chest pain and back pain that started x 3 hours ago.

## 2022-12-15 NOTE — ED Notes (Signed)
IVC Paperwork in process. 

## 2022-12-15 NOTE — ED Notes (Signed)
Pt stated to phlebotomy that he wanted to kill himself. Pt initially stated to this RN that he was not having suicidal or homicidal ideation.

## 2022-12-15 NOTE — Tx Team (Signed)
Initial Treatment Plan 12/15/2022 4:59 PM [name] [id number]    PATIENT STRESSORS: Educational concerns   Financial difficulties   Health problems   Legal issue   Occupational concerns   Substance abuse     PATIENT STRENGTHS: Capable of independent living  Forensic psychologist fund of knowledge    PATIENT IDENTIFIED PROBLEMS: Unstable Mood  Ineffective Coping Skills                   DISCHARGE CRITERIA:  Ability to meet basic life and health needs Adequate post-discharge living arrangements  PRELIMINARY DISCHARGE PLAN: Return to previous living arrangement Return to previous work or school arrangements  PATIENT/FAMILY INVOLVEMENT: This treatment plan has been presented to and reviewed with the patient, Charles Estes, and/or family member.  The patient and family have been given the opportunity to ask questions and make suggestions.  Berkley Harvey, RN 12/15/2022, 4:59 PM

## 2022-12-15 NOTE — Progress Notes (Signed)
Patient ID: Charles Estes, male   DOB: 17-Aug-1960, 62 y.o.   MRN: [id number]   D: Patient is newly admitted to BMU. Patient's skin assessment completed with Jillian  RN, skin is intact, no contraband found. Patient Denies current SI/HI/AVH.  Patient reports history of tobacco use. Patient reports current stressors of homelessness, health issues.   A: Patient oriented to unit/room/call light. Patient was offered support and encouragement. Patient was encourage to attend groups, participate in unit activities and continue with plan of care. Q x 15 minute observation checks were completed for safety.   R: Patient has no complaints at this time. Patient is receptive to treatment and safety maintained on unit.

## 2022-12-15 NOTE — ED Notes (Signed)
ED TO INPATIENT HANDOFF REPORT  ED Nurse Name and Phone #: Millisa Giarrusso RN [phone]  S Name/Age/Gender Charles Estes 62 y.o. male Room/Bed: 050C/050C  Code Status   Code Status: Prior  Home/SNF/Other Home Patient oriented to: self, place, time, and situation Is this baseline? Yes   Triage Complete: Triage complete  Chief Complaint Chest and Back Pain  Triage Note Pt c/o central chest pain and back pain that started x 3 hours ago.    Allergies Allergies  Allergen Reactions   Oatmeal Hives   Paxil [Paroxetine] Diarrhea and Other (See Comments)    Mouth sores    Level of Care/Admitting Diagnosis ED Disposition     [facility]   Condition  --   Comment  Patient has been medically cleared and is in stable condition, and is being admitted to a [facility] inpatient behavioral health hospital/unit.          B Medical/Surgery History Past Medical History:  Diagnosis Date   Depression    GERD (gastroesophageal reflux disease)    Hypertension    Pathologic ulnar fracture with malunion    right   Past Surgical History:  Procedure Laterality Date   MANDIBLE FRACTURE SURGERY  2014   NO PAST SURGERIES     ORIF ULNAR FRACTURE Right 07/16/2017   Procedure: OPEN REDUCTION INTERNAL FIXATION (ORIF) RIGHT ULNA FRACTURE NONUNION;  Surgeon: Tarry Kos, MD;  Location: [facility];  Service: Orthopedics;  Laterality: Right;     A IV Location/Drains/Wounds Patient Lines/Drains/Airways Status     Active Line/Drains/Airways     Name Placement date Placement time Site Days   Pressure Injury 03/23/22 Buttocks Bilateral Stage 1 -  Intact skin with non-blanchable redness of a localized area usually over a bony prominence. 03/23/22  1640  -- 267            Intake/Output Last 24 hours No intake or output data in the 24 hours ending 12/15/22 1457  Labs/Imaging Results for orders placed or performed during the hospital  encounter of 12/15/22 (from the past 48 hour(s))  Rapid urine drug screen (hospital performed)     Status: Abnormal   Collection Time: 12/15/22  3:57 AM  Result Value Ref Range   Opiates NONE DETECTED NONE DETECTED   Cocaine POSITIVE (A) NONE DETECTED   Benzodiazepines NONE DETECTED NONE DETECTED   Amphetamines NONE DETECTED NONE DETECTED   Tetrahydrocannabinol POSITIVE (A) NONE DETECTED   Barbiturates NONE DETECTED NONE DETECTED    Comment: (NOTE) DRUG SCREEN FOR MEDICAL PURPOSES ONLY.  IF CONFIRMATION IS NEEDED FOR ANY PURPOSE, NOTIFY LAB WITHIN 5 DAYS.  LOWEST DETECTABLE LIMITS FOR URINE DRUG SCREEN Drug Class                     Cutoff (ng/mL) Amphetamine and metabolites    1000 Barbiturate and metabolites    200 Benzodiazepine                 200 Opiates and metabolites        300 Cocaine and metabolites        300 THC                            50 Performed at [facility], [address] [place], [place] [address]   Basic metabolic panel     Status: Abnormal   Collection  Time: 12/15/22  4:08 AM  Result Value Ref Range   Sodium 139 135 - 145 mmol/L   Potassium 3.5 3.5 - 5.1 mmol/L   Chloride 100 98 - 111 mmol/L   CO2 23 22 - 32 mmol/L   Glucose, Bld 99 70 - 99 mg/dL    Comment: Glucose reference range applies only to samples taken after fasting for at least 8 hours.   BUN 10 6 - 20 mg/dL   Creatinine, Ser 1.61 0.61 - 1.24 mg/dL   Calcium 9.4 8.9 - 09.6 mg/dL   GFR, Estimated >04 >54 mL/min    Comment: (NOTE) Calculated using the CKD-EPI Creatinine Equation (2021)    Anion gap 16 (H) 5 - 15    Comment: Performed at [facility], [address], [place], [place] [address]  CBC     Status: Abnormal   Collection Time: 12/15/22  4:08 AM  Result Value Ref Range   WBC 7.3 4.0 - 10.5 K/uL   RBC 3.97 (L) 4.22 - 5.81 MIL/uL   Hemoglobin 12.1 (L) 13.0 - 17.0 g/dL   HCT 91.4 (L) 78.2 - 95.6 %   MCV 93.2 80.0 - 100.0 fL   MCH 30.5 26.0 - 34.0 pg   MCHC  32.7 30.0 - 36.0 g/dL   RDW 21.3 08.6 - 57.8 %   Platelets 269 150 - 400 K/uL   nRBC 0.0 0.0 - 0.2 %    Comment: Performed at [facility], [address], [place], [place] [address]  Troponin I (High Sensitivity)     Status: None   Collection Time: 12/15/22  4:08 AM  Result Value Ref Range   Troponin I (High Sensitivity) 8 <18 ng/L    Comment: (NOTE) Elevated high sensitivity troponin I (hsTnI) values and significant  changes across serial measurements may suggest ACS but many other  chronic and acute conditions are known to elevate hsTnI results.  Refer to the "Links" section for chest pain algorithms and additional  guidance. Performed at [facility], [address] [place], [place] [address]   Ethanol     Status: None   Collection Time: 12/15/22  4:08 AM  Result Value Ref Range   Alcohol, Ethyl (B) <10 <10 mg/dL    Comment: (NOTE) Lowest detectable limit for serum alcohol is 10 mg/dL.  For medical purposes only. Performed at [facility], [address] [place], [place] [address]   Salicylate level     Status: Abnormal   Collection Time: 12/15/22  4:08 AM  Result Value Ref Range   Salicylate Lvl <7.0 (L) 7.0 - 30.0 mg/dL    Comment: Performed at [facility], [address] [place], [place] [address]  Acetaminophen level     Status: Abnormal   Collection Time: 12/15/22  4:08 AM  Result Value Ref Range   Acetaminophen (Tylenol), Serum <10 (L) 10 - 30 ug/mL    Comment: (NOTE) Therapeutic concentrations vary significantly. A range of 10-30 ug/mL  may be an effective concentration for many patients. However, some  are best treated at concentrations outside of this range. Acetaminophen concentrations >150 ug/mL at 4 hours after ingestion  and >50 ug/mL at 12 hours after ingestion are often associated with  toxic reactions.  Performed at [facility], [address] [place], [place] [address]   Troponin I (High Sensitivity)     Status:  None   Collection Time: 12/15/22  6:25 AM  Result Value Ref Range   Troponin I (High Sensitivity) 8 <18 ng/L    Comment: (NOTE) Elevated high sensitivity troponin I (hsTnI) values and significant  changes across serial measurements may suggest ACS but many other  chronic and acute conditions are known to elevate hsTnI results.  Refer to the "Links" section for chest pain algorithms and additional  guidance. Performed at [facility], [address] [place], [place] [address]    DG Chest Portable 1 View  Result Date: 12/15/2022 CLINICAL DATA:  62 year old male with history of chest pain. EXAM: PORTABLE CHEST 1 VIEW COMPARISON:  Chest x-ray 10/18/2022. FINDINGS: Lung volumes are low. No consolidative airspace disease. No pleural effusions. No pneumothorax. No pulmonary nodule or mass noted. Pulmonary vasculature and the cardiomediastinal silhouette are within normal limits. IMPRESSION: 1. Low lung volumes without radiographic evidence of acute cardiopulmonary disease. Electronically Signed   By: Trudie Reed M.D.   On: 12/15/2022 05:42    Pending Labs Unresulted Labs (From admission, onward)    None       Vitals/Pain Today's Vitals   12/15/22 0346 12/15/22 0346 12/15/22 1027 12/15/22 1429  BP:  (!) 149/102 127/88 122/81  Pulse:  78 84 88  Resp:  18 17 20   Temp:  98.4 F (36.9 C) 98.7 F (37.1 C) 98.2 F (36.8 C)  TempSrc:   Oral Oral  SpO2:  99% 96% 98%  Weight: 86 kg     Height: 5\' 6"  (1.676 m)     PainSc: 9        Isolation Precautions No active isolations  Medications Medications  amLODipine (NORVASC) tablet 10 mg (10 mg Oral Given 12/15/22 0916)  apixaban (ELIQUIS) tablet 5 mg (5 mg Oral Given 12/15/22 0916)  ARIPiprazole (ABILIFY) tablet 10 mg (10 mg Oral Not Given 12/15/22 1013)  gabapentin (NEURONTIN) capsule 300 mg (has no administration in time range)  levETIRAcetam (KEPPRA) tablet 500 mg (500 mg Oral Given 12/15/22 0916)  pantoprazole (PROTONIX) EC  tablet 40 mg (40 mg Oral Given 12/15/22 0916)  acetaminophen (TYLENOL) tablet 1,000 mg (has no administration in time range)  ziprasidone (GEODON) injection 20 mg (20 mg Intramuscular Not Given 12/15/22 1223)  sterile water (preservative free) injection (  Not Given 12/15/22 1224)  LORazepam (ATIVAN) tablet 2 mg (has no administration in time range)    Or  LORazepam (ATIVAN) injection 2 mg (has no administration in time range)  diphenhydrAMINE (BENADRYL) capsule 50 mg (has no administration in time range)    Or  diphenhydrAMINE (BENADRYL) injection 50 mg (has no administration in time range)  haloperidol (HALDOL) tablet 5 mg (has no administration in time range)    Or  haloperidol lactate (HALDOL) injection 5 mg (has no administration in time range)    Mobility walks     Focused Assessments    R Recommendations: See Admitting Provider Note  Report given to:   Additional Notes:

## 2022-12-15 NOTE — ED Provider Notes (Addendum)
Care handoff received from Barrie Dunker, PA-C at shift change please see prior providers note for full details of visit.  In short 62 year old male presented to the ER for suicidal ideations along with chest pain.  Says centralized chest and back pain that began 3 hours prior to arrival.  He also reports drug use and vague homicidal ideations as well.  He reports hallucinations and was found by nursing staff during this visit with a pillowcase over his head trying to wrap the other end around his neck.  He also reports wanting to get [employer] and shoot himself.  Chart review reveals patient was discharged on November 26, 2022 following PE, he is on Eliquis 5 mg.  Labs revealed UDS positive for cocaine and THC.  Initial troponin was within normal limits.  Chest x-ray and EKG also reassuring.  At time of shift change I have been asked to follow-up on the delta troponin.  If that is within normal limits then patient is to be medically cleared for TTS evaluation and psychiatric disposition. Physical Exam  BP 127/88 (BP Location: Left Arm)   Pulse 84   Temp 98.7 F (37.1 C) (Oral)   Resp 17   Ht 5\' 6"  (1.676 m)   Wt 86 kg   SpO2 96%   BMI 30.60 kg/m   Physical Exam Constitutional:      General: He is not in acute distress.    Appearance: Normal appearance. He is well-developed. He is not ill-appearing or diaphoretic.  HENT:     Head: Normocephalic and atraumatic.  Eyes:     General: Vision grossly intact. Gaze aligned appropriately.     Pupils: Pupils are equal, round, and reactive to light.  Neck:     Trachea: Trachea and phonation normal.  Pulmonary:     Effort: Pulmonary effort is normal. No respiratory distress.  Musculoskeletal:        General: Normal range of motion.     Cervical back: Normal range of motion.  Skin:    General: Skin is warm and dry.  Neurological:     Mental Status: He is alert.     GCS: GCS eye subscore is 4. GCS verbal subscore is 5. GCS motor subscore is 6.      Comments: Speech is clear and goal oriented, follows commands Major Cranial nerves without deficit, no facial droop Moves extremities without ataxia, coordination intact  Psychiatric:        Behavior: Behavior normal.     Procedures  Procedures  ED Course / MDM   Clinical Course as of 12/15/22 1031  Sun Dec 15, 2022  0641 Await delta trop. If nml cleared for psych dispo [BM]  0743 Delta troponin within normal limits.  I evaluated this patient.  He is sleeping comfortably in the hallway bed.  He is in no acute distress.  He is easily arousable to voice.  He denies any pain reports he feels well but would like some breakfast.  He has no additional concerns. [BM]    Clinical Course User Index [BM] Elizabeth Palau   Medical Decision Making Care handoff received from Barrie Dunker, PA-C at shift change please see prior providers note for full details of visit.  In short 62 year old male presented to the ER for suicidal ideations along with chest pain.  Says centralized chest and back pain that began 3 hours prior to arrival.  He also reports drug use and vague homicidal ideations as well.  He reports  hallucinations and was found by nursing staff during this visit with a pillowcase over his head trying to wrap the other end around his neck.  He also reports wanting to get [employer] and shoot himself.  Chart review reveals patient was discharged on November 26, 2022 following PE, he is on Eliquis 5 mg.  Labs revealed UDS positive for cocaine and THC.  Initial troponin was within normal limits.  Chest x-ray and EKG also reassuring.  At time of shift change I have been asked to follow-up on the delta troponin.  If that is within normal limits then patient is to be medically cleared for TTS evaluation and psychiatric disposition.  Amount and/or Complexity of Data Reviewed Labs: ordered.    Details: Initial troponin within normal limits. Tylenol level negative. Salicylate level  negative. Ethanol level negative. CBC shows mild anemia of 12.1 which is similar to prior.  No leukocytosis to suggest infectious process.  No thrombocytopenia. UDS positive for cocaine and THC. BMP without emergent electrolyte derangement or AKI, small gap of 16. Delta troponin pending. Radiology: ordered. ECG/medicine tests:     Details: EKG did not crossover.  Physical copy of EKG was given to Dr. Bebe Shaggy earlier, case discussed and EKG did not show any emergent findings.  Risk OTC drugs. Prescription drug management. Risk Details: 7:44 AM: Delta troponin within normal limits.  I evaluated this patient.  He is sleeping comfortably in the hallway bed.  He is in no acute distress.  He is easily arousable to voice.  He denies any pain reports he feels well but would like some breakfast.  He has no additional concerns.  Per previous care plan patient is medically cleared for psychiatric disposition.  As he has no continued chest pain no indication for a third troponin.  Will order his labs including his Eliquis.  No chest pain or shortness of breath, vital signs reassuring.  Low suspicion for ACS, PE or other emergent pathology at this time.  Plan was discussed with Dr. Bebe Shaggy earlier who also reviewed patient EKG and agrees with plan.  As per most recent discharge note patient's aspirin, Depakote, lisinopril and trazodone were discontinued.  Daily medications ordered include amlodipine, apixaban, Abilify, gabapentin, Keppra and pantoprazole.   At this time there does not appear to be any evidence of an acute emergency medical condition and the patient appears stable for psychiatric disposition.  Addend to update vital signs.  Note: Portions of this report may have been transcribed using voice recognition software. Every effort was made to ensure accuracy; however, inadvertent computerized transcription errors may still be present.        Bill Salinas, PA-C 12/15/22 0752     Bill Salinas, PA-C 12/15/22 1031    Zadie Rhine, MD 12/15/22 [phone]

## 2022-12-15 NOTE — Consult Note (Addendum)
BH ED ASSESSMENT   Reason for Consult:  Psych Consult  Referring Physician:   Pamala Duffel   Patient Identification: Charles Estes MRN:  [id number] ED Chief Complaint: Severe recurrent major depressive disorder with psychotic features [facility])  Diagnosis:  Principal Problem:   Severe recurrent major depressive disorder with psychotic features (HCC) Active Problems:   Cocaine use disorder (HCC)   Suicidal ideation   ED Assessment Time Calculation: Start Time: 1100 Stop Time: 1125 Total Time in Minutes (Assessment Completion): 25   Subjective:    Charles Estes is a 62 y.o. Hispanic male with a past psychiatric history of bipolar disorder, cocaine induced bipolar, stimulant use disorder, polysubstance abuse, GAD with panic attacks, cannabis use disorder, alcohol use disorder, MDD with psychotic features, and cocaine use disorder, with pertinent medical comorbidities/history that include pulmonary embolism with subsequent placement on Eliquis (current), hypertension, closed fracture of facial bones (recent), and obesity, with pertinent additional past psychiatric history of multiple hospitalizations for decompensation of the patient's mental health in the context of worsening depressive mood, housing instability, polysubstance abuse, psychotic and manic features, and expressions of suicidal and homicidal ideations, who presents this encounter by way of self due to complaints of chest pain, and during triage, began to endorse expressions of suicidal ideations and bizarre behavior, which prompted psychiatric consult.  Patient currently involuntary committed and medically cleared.  HPI:    Patient seen today at [facility] for face-to-face psychiatric evaluation.  Upon evaluation, patient tells me that he is severely depressed, wants to end his life by way of suicide, proceeds to tell me that he has active plan and intent to lay on the train tracks and be hit by a train, buy an AK 47 to shoot  himself, and/or by way of any other means that he can to, "just get the job done already".  Patient begins to cry, endorses that he has been "screwing up" his life for nearly 35 years by doing cocaine, reports that his daughter has essentially abandoned him and will no longer help him and provide housing for him, as well as other family members have turned their back on him, reports that they are "tired of my shit".    Patient reports that he has been depressed for a really long time and having struggles with cocaine, reports that he has been hospitalized for mental health problems 15+ times and tried a variety of medications.  Patient reports most notably recent and most current medication of Abilify has "probably" been the most helpful, helps his overall mood he states.  Patient reports that in the context of cocaine use, he frequently gets chest pain, reiterates this is why he originally came in, reports he was dropped off by his girlfriend Angie, whom still at times socializes with him and sees him, but also will not let him stay with her, reports that he is currently homeless, states he has been homeless for quite a while, lives in his car.  Patient reports that he is on disability for damage to his skull, but frequently spends all of his money abusing cocaine, expresses that he hates himself for doing this.    Patient reports that in the context of cocaine use, he gets severe visual hallucinations, but is overall vague on this, states that he primarily sees shadows and other "weird" things, as well as endorses also vaguely that he hears negative voices telling him to do things that are bad.  Patient additionally notably endorses that outside  of cocaine use in his depressive state, he also experiences negative voices that tell him he is no good and to do bad things.  Patient reports that outside of multiple hospitalizations for decompensation of his mental health, he has very rarely used outpatient  services, states that inpatient is largely better, provides him a place to rest his head, as well as keeps him away from using cocaine, and able to eat and take medications.  Patient orientation during our assessment is intact, states that he is not experiencing psychotic features during our conversation at the moment, and objectively, does not appear to be responding to internal stimuli.  Patient endorses during cocaine use he gets paranoid, does not feel paranoid during our interaction however.  Patient endorses that he would like to get help for his depression and substance use, wants to consider potentially residential treatment down the road in the near future, asks if this can be facilitated.  Patient endorses cocaine use is significant, uses just about every day he can, in as much of a quantity as he can, states "a couple grams at a time".  Patient endorses minimal other drug use, though does use cannabis often as well he admits (which is consistent with UDS), endorses additionally that he has used "just about everything". Pt. does not expand on what "everything" is. Patient endorses very little EtOH use, but does have a history of this.  Patient denies homicidal ideations, reiterates he is actively suicidal because of how much he has screwed up his life, and "I just cannot believe him [age] and still a piece of shit doing drugs".  Patient describes a history of manic symptomology in the context of severe substance abuse, gives low descriptive history of bipolar symptoms outside of substance use.  Collateral (whitaker,demekka) (Daughter) [phone]; Called x 3 no answer    Past Psychiatric History: Per Dr. Theodis Aguas   Prev Dx/Sx: bipolar disorder, stimulant use disorder, GAD with panic attacks, cannabis use disorder, and alcohol use disorder  Current Psych Provider: Denies Current Meds: Patient's current psychiatric meds includes Depakote 500 mg twice daily and Abilify 10 mg daily.  Patient  is nonadherent on any of his medications. Previous Med Trials: Obtained from chart review Haldol-reports it makes him feel "crazy" Abilify and Depakote, from last admission; combination works well for him.  Noncompliance due to GI upset while unable to eat. Seroquel-reports he has used this in the past for sleep, reports it is effective. Therapy: Patient has had 2 prior psychiatric hospitalizations under IVC for threatening violence towards others.  No evidence on chart review or from prior collateral calls that patient has ever acted out on these violent threats. Prior ECT: None Prior Psych Hospitalization:  May [facility]24 at [facility] suicidal and homicidal ideation March 2024 at [facility] for active suicidal ideation and active homicidal ideation with the intent of killing a group of acquaintances he believes left him for dead in his car, then killing himself.  Risk to Self or Others: Is the patient at risk to self? Yes Has the patient been a risk to self in the past 6 months? Yes Has the patient been a risk to self within the distant past? Yes Is the patient a risk to others? No Has the patient been a risk to others in the past 6 months? No Has the patient been a risk to others within the distant past? No  [place] Scale:  Flowsheet Row ED from 12/15/2022 in [facility] Emergency Department at [facility]  ED to Hosp-Admission (Discharged) from 11/19/2022 in [facility] Admission (Discharged) [facility]  C-SSRS RISK CATEGORY High Risk No Risk High Risk       Substance Abuse: Cocaine "thirty-five years", cannabis, EtOH, "everything"  Past Medical History:  Past Medical History:  Diagnosis Date   Depression    GERD (gastroesophageal reflux disease)    Hypertension    Pathologic ulnar fracture with malunion    right    Past Surgical History:  Procedure Laterality Date   MANDIBLE FRACTURE SURGERY  2014   NO PAST  SURGERIES     ORIF ULNAR FRACTURE Right 07/16/2017   Procedure: OPEN REDUCTION INTERNAL FIXATION (ORIF) RIGHT ULNA FRACTURE NONUNION;  Surgeon: Tarry Kos, MD;  Location: [facility];  Service: Orthopedics;  Laterality: Right;   Family History:  Family History  Problem Relation Age of Onset   Hypertension Mother    Heart attack Mother    Hypertension Father    Heart attack Father    Heart attack Sister    Hypertension Sister    Heart attack Sister    Hypertension Brother    Family Psychiatric  History: Per record; none endorsed during evaluation Social History:  Social History   Substance and Sexual Activity  Alcohol Use Yes   Comment: socially- past weekend reports 10 beers     Social History   Substance and Sexual Activity  Drug Use Yes   Types: Cocaine   Comment: occasionally    Social History   Socioeconomic History   Marital status: Divorced    Spouse name: Not on file   Number of children: Not on file   Years of education: 14   Highest education level: Associate degree: occupational, Scientist, product/process development, or vocational program  Occupational History   Not on file  Tobacco Use   Smoking status: Never   Smokeless tobacco: Never  Substance and Sexual Activity   Alcohol use: Yes    Comment: socially- past weekend reports 10 beers   Drug use: Yes    Types: Cocaine    Comment: occasionally   Sexual activity: Yes    Birth control/protection: None  Other Topics Concern   Not on file  Social History Narrative   Not on file   Social Determinants of Health   Financial Resource Strain: Not on File (09/26/2021)   Received from [employer]    Financial Resource Strain: 0  Food Insecurity: Food Insecurity Present (10/19/2022)   Hunger Vital Sign    Worried About Running Out of Food in the Last Year: Sometimes true    Ran Out of Food in the Last Year: Sometimes true  Transportation Needs: No Transportation Needs (10/19/2022)   PRAPARE  - Administrator, Civil Service (Medical): No    Lack of Transportation (Non-Medical): No  Recent Concern: Transportation Needs - Unmet Transportation Needs (08/26/2022)   PRAPARE - Transportation    Lack of Transportation (Medical): Yes    Lack of Transportation (Non-Medical): Yes  Physical Activity: Not on File (09/26/2021)   Received [facility]   Physical Activity    Physical Activity: 0  Stress: Not on File (09/26/2021)   Received from [facility]   Stress    Stress: 0  Social Connections: Unknown (10/15/2021)   Received from [employer]   Social Network    Social Network: Not on file   Additional Social History:  Allergies:   Allergies  Allergen Reactions   Oatmeal Hives   Paxil [Paroxetine] Diarrhea and Other (See Comments)    Mouth sores    Labs:  Results for orders placed or performed during the hospital encounter of 12/15/22 (from the past 48 hour(s))  Rapid urine drug screen (hospital performed)     Status: Abnormal   Collection Time: 12/15/22  3:57 AM  Result Value Ref Range   Opiates NONE DETECTED NONE DETECTED   Cocaine POSITIVE (A) NONE DETECTED   Benzodiazepines NONE DETECTED NONE DETECTED   Amphetamines NONE DETECTED NONE DETECTED   Tetrahydrocannabinol POSITIVE (A) NONE DETECTED   Barbiturates NONE DETECTED NONE DETECTED    Comment: (NOTE) DRUG SCREEN FOR MEDICAL PURPOSES ONLY.  IF CONFIRMATION IS NEEDED FOR ANY PURPOSE, NOTIFY LAB WITHIN 5 DAYS.  LOWEST DETECTABLE LIMITS FOR URINE DRUG SCREEN Drug Class                     Cutoff (ng/mL) Amphetamine and metabolites    1000 Barbiturate and metabolites    200 Benzodiazepine                 200 Opiates and metabolites        300 Cocaine and metabolites        300 THC                            50 Performed at [facility], [address], [place], [place] [address]   Basic metabolic panel     Status: Abnormal   Collection Time: 12/15/22  4:08 AM  Result Value Ref Range   Sodium  139 135 - 145 mmol/L   Potassium 3.5 3.5 - 5.1 mmol/L   Chloride 100 98 - 111 mmol/L   CO2 23 22 - 32 mmol/L   Glucose, Bld 99 70 - 99 mg/dL    Comment: Glucose reference range applies only to samples taken after fasting for at least 8 hours.   BUN 10 6 - 20 mg/dL   Creatinine, Ser 3.08 0.61 - 1.24 mg/dL   Calcium 9.4 8.9 - 65.7 mg/dL   GFR, Estimated >84 >69 mL/min    Comment: (NOTE) Calculated using the CKD-EPI Creatinine Equation (2021)    Anion gap 16 (H) 5 - 15    Comment: Performed at [facility], [address] [place], [place] [address]  CBC     Status: Abnormal   Collection Time: 12/15/22  4:08 AM  Result Value Ref Range   WBC 7.3 4.0 - 10.5 K/uL   RBC 3.97 (L) 4.22 - 5.81 MIL/uL   Hemoglobin 12.1 (L) 13.0 - 17.0 g/dL   HCT 84.1 (L) 32.4 - 40.1 %   MCV 93.2 80.0 - 100.0 fL   MCH 30.5 26.0 - 34.0 pg   MCHC 32.7 30.0 - 36.0 g/dL   RDW 02.7 25.3 - 66.4 %   Platelets 269 150 - 400 K/uL   nRBC 0.0 0.0 - 0.2 %    Comment: Performed at [facility], [address] [place], [place] [address]  Troponin I (High Sensitivity)     Status: None   Collection Time: 12/15/22  4:08 AM  Result Value Ref Range   Troponin I (High Sensitivity) 8 <18 ng/L    Comment: (NOTE) Elevated high sensitivity troponin I (hsTnI) values and significant  changes across serial measurements may suggest ACS but  many other  chronic and acute conditions are known to elevate hsTnI results.  Refer to the "Links" section for chest pain algorithms and additional  guidance. Performed at [facility], [address], [place], [place] [address]   Ethanol     Status: None   Collection Time: 12/15/22  4:08 AM  Result Value Ref Range   Alcohol, Ethyl (B) <10 <10 mg/dL    Comment: (NOTE) Lowest detectable limit for serum alcohol is 10 mg/dL.  For medical purposes only. Performed at [facility], [address] [place], [place] [address]   Salicylate level     Status: Abnormal    Collection Time: 12/15/22  4:08 AM  Result Value Ref Range   Salicylate Lvl <7.0 (L) 7.0 - 30.0 mg/dL    Comment: Performed at [facility], [address], [place], [place] [address]  Acetaminophen level     Status: Abnormal   Collection Time: 12/15/22  4:08 AM  Result Value Ref Range   Acetaminophen (Tylenol), Serum <10 (L) 10 - 30 ug/mL    Comment: (NOTE) Therapeutic concentrations vary significantly. A range of 10-30 ug/mL  may be an effective concentration for many patients. However, some  are best treated at concentrations outside of this range. Acetaminophen concentrations >150 ug/mL at 4 hours after ingestion  and >50 ug/mL at 12 hours after ingestion are often associated with  toxic reactions.  Performed at [facility], [address], [place], [place] [address]   Troponin I (High Sensitivity)     Status: None   Collection Time: 12/15/22  6:25 AM  Result Value Ref Range   Troponin I (High Sensitivity) 8 <18 ng/L    Comment: (NOTE) Elevated high sensitivity troponin I (hsTnI) values and significant  changes across serial measurements may suggest ACS but many other  chronic and acute conditions are known to elevate hsTnI results.  Refer to the "Links" section for chest pain algorithms and additional  guidance. Performed at [facility], [address], [place], [place] [address]     Current Facility-Administered Medications  Medication Dose Route Frequency Provider Last Rate Last Admin   acetaminophen (TYLENOL) tablet 1,000 mg  1,000 mg Oral Q8H PRN Harlene Salts A, PA-C       amLODipine (NORVASC) tablet 10 mg  10 mg Oral Daily Harlene Salts A, PA-C   10 mg at 12/15/22 3244   apixaban (ELIQUIS) tablet 5 mg  5 mg Oral BID Harlene Salts A, PA-C   5 mg at 12/15/22 0102   ARIPiprazole (ABILIFY) tablet 10 mg  10 mg Oral Daily Harlene Salts A, PA-C       ARIPiprazole (ABILIFY) tablet 10 mg  10 mg Oral Daily Lenox Ponds, NP       gabapentin  (NEURONTIN) capsule 300 mg  300 mg Oral QHS Morelli, Brandon A, PA-C       levETIRAcetam (KEPPRA) tablet 500 mg  500 mg Oral BID Harlene Salts A, PA-C   500 mg at 12/15/22 0916   pantoprazole (PROTONIX) EC tablet 40 mg  40 mg Oral Daily Harlene Salts A, PA-C   40 mg at 12/15/22 7253   Current Outpatient Medications  Medication Sig Dispense Refill   acetaminophen (TYLENOL) 500 MG tablet Take 2 tablets (1,000 mg total) by mouth every 8 (eight) hours as needed for mild pain or headache.     amLODipine (NORVASC) 10 MG tablet Take 1 tablet (10 mg total)  by mouth daily. 30 tablet 0   apixaban (ELIQUIS) 5 MG TABS tablet Take 1 tablet (5 mg total) by mouth 2 (two) times daily. 60 tablet 0   ARIPiprazole (ABILIFY) 10 MG tablet Take 1 tablet (10 mg total) by mouth daily. 30 tablet 0   gabapentin (NEURONTIN) 300 MG capsule Take 1 capsule (300 mg total) by mouth at bedtime. 30 capsule 0   pantoprazole (PROTONIX) 40 MG tablet Take 1 tablet (40 mg total) by mouth daily. 30 tablet 0   levETIRAcetam (KEPPRA) 500 MG tablet Take 1 tablet (500 mg total) by mouth 2 (two) times daily for 2 doses. (Patient not taking: Reported on 12/15/2022) 2 tablet 0    Musculoskeletal: Strength & Muscle Tone: increased Gait & Station: normal Patient leans: N/A   Psychiatric Specialty Exam: Presentation  General Appearance:  Casual; Appropriate for Environment; Other (comment) ([place])  Eye Contact: Other (comment) (Brief)  Speech: Clear and Coherent; Normal Rate  Speech Volume: Normal  Handedness: Right   Mood and Affect  Mood: Depressed; Anxious; Worthless  Affect: Congruent; Tearful   Thought Process  Thought Processes: Coherent; Goal Directed; Linear  Descriptions of Associations:Intact  Orientation:Full (Time, Place and Person)  Thought Content:Logical  History of Schizophrenia/Schizoaffective disorder:Yes  Duration of Psychotic Symptoms:Greater than six  months  Hallucinations:Hallucinations: Auditory; Visual Description of Auditory Hallucinations: Vague, reports hearing negative voices telling him things that are bad Description of Visual Hallucinations: Vague, reports seeing shadows  Ideas of Reference:None  Suicidal Thoughts:Suicidal Thoughts: Yes, Active SI Active Intent and/or Plan: With Intent; With Plan; With Means to Carry Out; With Access to Means  Homicidal Thoughts:Homicidal Thoughts: No   Sensorium  Memory: Immediate Fair; Recent Fair; Remote Fair  Judgment: Poor  Insight: Shallow   Executive Functions  Concentration: Fair  Attention Span: Fair  Recall: [employer] of Knowledge: Fair  Language: Fair   Psychomotor Activity  Psychomotor Activity: Psychomotor Activity: Other (comment) (Normal to mildly restless)   Assets  Assets: Manufacturing systems engineer; Financial Resources/Insurance; Desire for Improvement; Physical Health; Resilience; Leisure Time    Sleep  Sleep: Sleep: Poor   Physical Exam: Physical Exam Vitals and nursing note reviewed.  Constitutional:      General: He is not in acute distress.    Appearance: He is obese. He is not ill-appearing, toxic-appearing or diaphoretic.  Pulmonary:     Effort: Pulmonary effort is normal.  Neurological:     Mental Status: He is alert and oriented to person, place, and time.  Psychiatric:        Attention and Perception: He is attentive. He perceives auditory and visual hallucinations.        Mood and Affect: Mood is anxious and depressed. Affect is tearful.        Speech: Speech normal.        Behavior: Behavior is cooperative.        Thought Content: Thought content is not paranoid or delusional. Thought content includes suicidal ideation. Thought content does not include homicidal ideation.    Review of Systems  Cardiovascular:  Positive for chest pain.  Musculoskeletal:  Positive for myalgias.  Psychiatric/Behavioral:  Positive for  depression, hallucinations, substance abuse and suicidal ideas. The patient is nervous/anxious and has insomnia.   All other systems reviewed and are negative.  Blood pressure 127/88, pulse 84, temperature 98.7 F (37.1 C), temperature source Oral, resp. rate 17, height 5\' 6"  (1.676 m), weight 86 kg, SpO2 96%. Body mass index is 30.6 kg/m.  Medical Decision Making:  Diagnostically, the patient presents with symptomology that is most consistent with the patient's historical and current diagnoses of severe cocaine use disorder and unipolar major depression with psychotic features.  Patient seems to articulate, as well as through records review, that his presentations with bipolar affective features have largely been in the context of severe stimulant use, versus a primary bipolar affective disorder, thus low clinical suspicion for primary bipolar affective disorder versus unipolar presentation at this time.  Given the patient's active endorsements of suicidal ideations, severe depressive symptomology, and desires to address his substance abuse, will recommend upholding IVC, as well as inpatient hospitalization and ideally subsequent residential substance abuse treatment.  Patient at this time is being reviewed for inpatient hospitalization at [facility], but CSW team will fax out if not approved, and psychiatry will continue to follow the patient until disposition is obtained.  Recommendations  #MDD with psychotic features #Cocaine use disorder severe #SI  -Recommend continue IVC -Recommend inpatient hospitalization and ideally subsequent residential substance abuse treatment, if available -Recommend continue Abilify 10 mg p.o. daily -Recommend continue safety protocols   Disposition: Recommend psychiatric Inpatient admission when medically cleared.  Lenox Ponds, NP 12/15/2022 12:00 PM
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
## 2022-12-15 NOTE — ED Notes (Signed)
Locker number 3.  °

## 2022-12-15 NOTE — Plan of Care (Signed)
  Problem: Skin Integrity: Goal: Risk for impaired skin integrity will decrease Outcome: Not Progressing   Problem: Education: Goal: Knowledge of [facility] Education information/materials will improve Outcome: Not Progressing Goal: Emotional status will improve Outcome: Not Progressing Goal: Mental status will improve Outcome: Not Progressing Goal: Verbalization of understanding the information provided will improve Outcome: Not Progressing  No admission

## 2022-12-15 NOTE — Progress Notes (Signed)
[facility] LCSW Progress Note   12/15/2022    2:51 PM  Charles Estes   [id number]   Type of Contact and Topic:  Psychiatric Bed Placement   Pt accepted to [facility] BMU 324     Patient meets inpatient criteria per Arsenio Loader, NP   The attending provider will be Dr. Marlou Porch   Call report to [phone]  April Smith, RN @ [facility] ED notified.     Pt scheduled  to arrive at [facility] TODAY. The patient's bed is currently ready.    Damita Dunnings, MSW, LCSW-A  2:52 PM 12/15/2022

## 2022-12-16 DIAGNOSIS — F333 Major depressive disorder, recurrent, severe with psychotic symptoms: Secondary | ICD-10-CM | POA: Diagnosis not present

## 2022-12-16 NOTE — Progress Notes (Signed)
 Patient calm and pleasant during assessment denying SI/HI/AVH. Pt observed interacting appropriately with staff and peers on the unit. Pt compliant with medication administration per MD orders. Pt given education, support, and encouragement to be active in his treatment plan. Pt being monitored Q 15 minutes for safety per unit protocol, remains safe on the unit  

## 2022-12-16 NOTE — BH IP Treatment Plan (Signed)
Interdisciplinary Treatment and Diagnostic Plan Update  12/16/2022 Time of Session: 9:00AM Ronnell Clinger MRN: [id number]  Principal Diagnosis: MDD (major depressive disorder), recurrent, severe, with psychosis (HCC)  Secondary Diagnoses: Principal Problem:   MDD (major depressive disorder), recurrent, severe, with psychosis (HCC)   Current Medications:  Current Facility-Administered Medications  Medication Dose Route Frequency Provider Last Rate Last Admin   acetaminophen (TYLENOL) tablet 650 mg  650 mg Oral Q6H PRN Lenox Ponds, NP       alum & mag hydroxide-simeth (MAALOX/MYLANTA) 200-200-20 MG/5ML suspension 30 mL  30 mL Oral Q4H PRN Lenox Ponds, NP       amLODipine (NORVASC) tablet 10 mg  10 mg Oral Daily Lenox Ponds, NP   10 mg at 12/16/22 0855   apixaban (ELIQUIS) tablet 5 mg  5 mg Oral BID Lenox Ponds, NP   5 mg at 12/16/22 0855   ARIPiprazole (ABILIFY) tablet 10 mg  10 mg Oral Daily Lenox Ponds, NP   10 mg at 12/16/22 0855   diphenhydrAMINE (BENADRYL) capsule 50 mg  50 mg Oral TID PRN Lenox Ponds, NP       Or   diphenhydrAMINE (BENADRYL) injection 50 mg  50 mg Intramuscular TID PRN Lenox Ponds, NP       gabapentin (NEURONTIN) capsule 300 mg  300 mg Oral QHS Lenox Ponds, NP       haloperidol (HALDOL) tablet 5 mg  5 mg Oral TID PRN Lenox Ponds, NP       Or   haloperidol lactate (HALDOL) injection 5 mg  5 mg Intramuscular TID PRN Lenox Ponds, NP       hydrOXYzine (ATARAX) tablet 25 mg  25 mg Oral TID PRN Lenox Ponds, NP       levETIRAcetam (KEPPRA) tablet 500 mg  500 mg Oral BID Lenox Ponds, NP   500 mg at 12/16/22 0855   LORazepam (ATIVAN) tablet 2 mg  2 mg Oral TID PRN Lenox Ponds, NP       Or   LORazepam (ATIVAN) injection 2 mg  2 mg Intramuscular TID PRN Lenox Ponds, NP       magnesium hydroxide (MILK OF MAGNESIA) suspension 30 mL  30 mL Oral Daily PRN Lenox Ponds, NP       nicotine (NICODERM CQ - dosed  in mg/24 hours) patch 14 mg  14 mg Transdermal Q0600 Lenox Ponds, NP       pantoprazole (PROTONIX) EC tablet 40 mg  40 mg Oral Daily Lenox Ponds, NP   40 mg at 12/16/22 0855   traZODone (DESYREL) tablet 50 mg  50 mg Oral QHS PRN Lenox Ponds, NP       PTA Medications: Medications Prior to Admission  Medication Sig Dispense Refill Last Dose   acetaminophen (TYLENOL) 500 MG tablet Take 2 tablets (1,000 mg total) by mouth every 8 (eight) hours as needed for mild pain or headache.      amLODipine (NORVASC) 10 MG tablet Take 1 tablet (10 mg total) by mouth daily. 30 tablet 0    apixaban (ELIQUIS) 5 MG TABS tablet Take 1 tablet (5 mg total) by mouth 2 (two) times daily. 60 tablet 0    ARIPiprazole (ABILIFY) 10 MG tablet Take 1 tablet (10 mg total) by mouth daily. 30 tablet 0    gabapentin (NEURONTIN) 300 MG capsule Take 1 capsule (300 mg total) by mouth at bedtime. 30  capsule 0    levETIRAcetam (KEPPRA) 500 MG tablet Take 1 tablet (500 mg total) by mouth 2 (two) times daily for 2 doses. (Patient not taking: Reported on 12/15/2022) 2 tablet 0    pantoprazole (PROTONIX) 40 MG tablet Take 1 tablet (40 mg total) by mouth daily. 30 tablet 0     Patient Stressors: Educational Building services engineer difficulties   Health problems   Legal issue   Occupational concerns   Substance abuse    Patient Strengths: Capable of independent living  Forensic psychologist fund of knowledge   Treatment Modalities: Medication Management, Group therapy, Case management,  1 to 1 session with clinician, Psychoeducation, Recreational therapy.   Physician Treatment Plan for Primary Diagnosis: MDD (major depressive disorder), recurrent, severe, with psychosis (HCC) Long Term Goal(s): Improvement in symptoms so as ready for discharge   Short Term Goals: Ability to identify changes in lifestyle to reduce recurrence of condition will improve Ability to verbalize feelings will improve Ability to  identify and develop effective coping behaviors will improve Ability to identify triggers associated with substance abuse/mental health issues will improve  Medication Management: Evaluate patient's response, side effects, and tolerance of medication regimen.  Therapeutic Interventions: 1 to 1 sessions, Unit Group sessions and Medication administration.  Evaluation of Outcomes: Not Met  Physician Treatment Plan for Secondary Diagnosis: Principal Problem:   MDD (major depressive disorder), recurrent, severe, with psychosis (HCC)  Long Term Goal(s): Improvement in symptoms so as ready for discharge   Short Term Goals: Ability to identify changes in lifestyle to reduce recurrence of condition will improve Ability to verbalize feelings will improve Ability to identify and develop effective coping behaviors will improve Ability to identify triggers associated with substance abuse/mental health issues will improve     Medication Management: Evaluate patient's response, side effects, and tolerance of medication regimen.  Therapeutic Interventions: 1 to 1 sessions, Unit Group sessions and Medication administration.  Evaluation of Outcomes: Not Met   RN Treatment Plan for Primary Diagnosis: MDD (major depressive disorder), recurrent, severe, with psychosis (HCC) Long Term Goal(s): Knowledge of disease and therapeutic regimen to maintain health will improve  Short Term Goals: Ability to demonstrate self-control, Ability to participate in decision making will improve, Ability to verbalize feelings will improve, Ability to disclose and discuss suicidal ideas, Ability to identify and develop effective coping behaviors will improve, and Compliance with prescribed medications will improve  Medication Management: RN will administer medications as ordered by provider, will assess and evaluate patient's response and provide education to patient for prescribed medication. RN will report any adverse  and/or side effects to prescribing provider.  Therapeutic Interventions: 1 on 1 counseling sessions, Psychoeducation, Medication administration, Evaluate responses to treatment, Monitor vital signs and CBGs as ordered, Perform/monitor CIWA, COWS, AIMS and Fall Risk screenings as ordered, Perform wound care treatments as ordered.  Evaluation of Outcomes: Not Met   LCSW Treatment Plan for Primary Diagnosis: MDD (major depressive disorder), recurrent, severe, with psychosis (HCC) Long Term Goal(s): Safe transition to appropriate next level of care at discharge, Engage patient in therapeutic group addressing interpersonal concerns.  Short Term Goals: Engage patient in aftercare planning with referrals and resources, Increase social support, Increase ability to appropriately verbalize feelings, Increase emotional regulation, Facilitate acceptance of mental health diagnosis and concerns, and Increase skills for wellness and recovery  Therapeutic Interventions: Assess for all discharge needs, 1 to 1 time with Social worker, Explore available resources and support systems, Assess for adequacy in  community support network, Educate family and significant other(s) on suicide prevention, Complete Psychosocial Assessment, Interpersonal group therapy.  Evaluation of Outcomes: Not Met   Progress in Treatment: Attending groups: No. Participating in groups: No. Taking medication as prescribed: Yes. Toleration medication: Yes. Family/Significant other contact made: No, will contact:  once permission is given Patient understands diagnosis: Yes. Discussing patient identified problems/goals with staff: Yes. Medical problems stabilized or resolved: Yes. Denies suicidal/homicidal ideation: Yes. Issues/concerns per patient self-inventory: No. Other: none  New problem(s) identified: No, Describe:  none  New Short Term/Long Term Goal(s): detox, elimination of symptoms of psychosis, medication management for  mood stabilization; elimination of SI thoughts; development of comprehensive mental wellness/sobriety plan.   Patient Goals:  Patient given the opportunity to attend treatment team, however declined.  Discharge Plan or Barriers: CSW to assist in the development of appropriate discharge plans.   Reason for Continuation of Hospitalization: Anxiety Depression Medication stabilization Suicidal ideation  Estimated Length of Stay:  1-7 days  Last 3 [place] Suicide Severity Risk Score: Flowsheet Row Admission (Current) from 12/15/2022 in [facility] INPATIENT BEHAVIORAL MEDICINE Most recent reading at 12/15/2022  4:30 PM ED from 12/15/2022 in [facility] Emergency Department at [facility] Most recent reading at 12/15/2022  8:45 AM ED to Hosp-Admission (Discharged) from 11/19/2022 in [facility] Most recent reading at 11/26/2022  9:00 AM  C-SSRS RISK CATEGORY High Risk High Risk No Risk       Last PHQ 2/9 Scores:    08/24/2022    6:38 AM 08/21/2022    9:21 AM  Depression screen PHQ 2/9  Decreased Interest 1 0  Down, Depressed, Hopeless 1 3  PHQ - 2 Score 2 3  Altered sleeping 1 3  Tired, decreased energy 1 3  Change in appetite 1 1  Feeling bad or failure about yourself  1 3  Trouble concentrating 1 2  Moving slowly or fidgety/restless 1 3  Suicidal thoughts 1 2  PHQ-9 Score 9 20  Difficult doing work/chores Somewhat difficult     Scribe for Treatment Team: Harden Mo, [place] 12/16/2022 12:19 PM

## 2022-12-16 NOTE — Group Note (Signed)
Recreation Therapy Group Note   Group Topic:Health and Wellness  Group Date: 12/16/2022 Start Time: 1000 End Time: 1035 Facilitators: Rosina Lowenstein, LRT, CTRS Location:  Courtyard  Group Description: [employer]. LRT and patients played games of basketball and drew with chalk while outside in the courtyard while getting fresh air and sunlight. Music was being played in the background. LRT and peers conversed about different games they have played before. LRT encouraged pts to drink water after being outside, sweating and getting their heart rate up.  Goal Area(s) Addressed: Patient will build on frustration tolerance skills. Patients will partake in a competitive play game with peers. Patients will gain knowledge of new leisure interest/hobby.   Affect/Mood: N/A   Participation Level: Did not attend    Clinical Observations/Individualized Feedback: Brayon did not attend group.  Plan: Continue to engage patient in RT group sessions 2-3x/week.   Rosina Lowenstein, LRT, CTRS 12/16/2022 10:46 AM

## 2022-12-16 NOTE — Group Note (Signed)
Date:  12/16/2022 Time:  6:26 PM  Group Topic/Focus:  Outdoor recreation    Participation Level:  Did Not Attend  Charles Estes 12/16/2022, 6:26 PM

## 2022-12-16 NOTE — Plan of Care (Signed)
  Problem: Education: Goal: Knowledge of General Education information will improve Description: Including pain rating scale, medication(s)/side effects and non-pharmacologic comfort measures Outcome: Progressing   Problem: Health Behavior/Discharge Planning: Goal: Ability to manage health-related needs will improve Outcome: Progressing   Problem: Clinical Measurements: Goal: Ability to maintain clinical measurements within normal limits will improve Outcome: Progressing Goal: Will remain free from infection Outcome: Progressing Goal: Diagnostic test results will improve Outcome: Progressing Goal: Respiratory complications will improve Outcome: Progressing Goal: Cardiovascular complication will be avoided Outcome: Progressing   Problem: Activity: Goal: Risk for activity intolerance will decrease Outcome: Progressing   Problem: Nutrition: Goal: Adequate nutrition will be maintained Outcome: Progressing   Problem: Coping: Goal: Level of anxiety will decrease Outcome: Progressing   Problem: Elimination: Goal: Will not experience complications related to bowel motility Outcome: Progressing Goal: Will not experience complications related to urinary retention Outcome: Progressing   Problem: Pain Managment: Goal: General experience of comfort will improve Outcome: Progressing   Problem: Safety: Goal: Ability to remain free from injury will improve Outcome: Progressing   Problem: Skin Integrity: Goal: Risk for impaired skin integrity will decrease Outcome: Progressing   Problem: Education: Goal: Knowledge of [facility] Education information/materials will improve Outcome: Progressing Goal: Emotional status will improve Outcome: Progressing Goal: Mental status will improve Outcome: Progressing Goal: Verbalization of understanding the information provided will improve Outcome: Progressing   Problem: Activity: Goal: Interest or engagement in activities will  improve Outcome: Progressing Goal: Sleeping patterns will improve Outcome: Progressing   Problem: Coping: Goal: Ability to verbalize frustrations and anger appropriately will improve Outcome: Progressing Goal: Ability to demonstrate self-control will improve Outcome: Progressing   Problem: Health Behavior/Discharge Planning: Goal: Identification of resources available to assist in meeting health care needs will improve Outcome: Progressing Goal: Compliance with treatment plan for underlying cause of condition will improve Outcome: Progressing   Problem: Physical Regulation: Goal: Ability to maintain clinical measurements within normal limits will improve Outcome: Progressing   Problem: Safety: Goal: Periods of time without injury will increase Outcome: Progressing   

## 2022-12-16 NOTE — Group Note (Signed)
Date:  12/16/2022 Time:  9:35 PM  Group Topic/Focus:  Building Self Esteem:   The Focus of this group is helping patients become aware of the effects of self-esteem on their lives, the things they and others do that enhance or undermine their self-esteem, seeing the relationship between their level of self-esteem and the choices they make and learning ways to enhance self-esteem. Goals Group:   The focus of this group is to help patients establish daily goals to achieve during treatment and discuss how the patient can incorporate goal setting into their daily lives to aide in recovery. Self Care:   The focus of this group is to help patients understand the importance of self-care in order to improve or restore emotional, physical, spiritual, interpersonal, and financial health. Wrap-Up Group:   The focus of this group is to help patients review their daily goal of treatment and discuss progress on daily workbooks.    Participation Level:  Did Not Attend   Doug Sou 12/16/2022, 9:35 PM

## 2022-12-16 NOTE — Progress Notes (Signed)
Suicide Risk Assessment  Admission Assessment    [facility] Admission Suicide Risk Assessment   Nursing information obtained from:  Patient Demographic factors:  Male Current Mental Status:  Suicide plan Loss Factors:  Decrease in vocational status, Loss of significant relationship, Decline in physical health, Legal issues, Financial problems / change in socioeconomic status Historical Factors:  Prior suicide attempts, Impulsivity Risk Reduction Factors:  NA  Total Time spent with patient: 1 hour Principal Problem: MDD (major depressive disorder), recurrent, severe, with psychosis (HCC) Diagnosis:  Principal Problem:   MDD (major depressive disorder), recurrent, severe, with psychosis (HCC)  Subjective Data: Pt is a 62 y/o male w/ history of cocaine use disorder, MDD, bipolar disorder, GAD, who initially presented to [facility] on 12/15/22 for central chest pain and back pain that started 3 hours prior to presentation. While at [facility], pt endorsed suicidal and homicidal ideations, cocaine use. Pt was evaluated by psychiatry and recommended for inpatient psychiatric admission. He was subsequently transferred to [facility] inpatient psychiatry.   Continued Clinical Symptoms:  Alcohol Use Disorder Identification Test Final Score (AUDIT): 0 The "Alcohol Use Disorders Identification Test", Guidelines for Use in Primary Care, Second Edition.  World Science writer [facility]). Score between 0-7:  no or low risk or alcohol related problems. Score between 8-15:  moderate risk of alcohol related problems. Score between 16-19:  high risk of alcohol related problems. Score 20 or above:  warrants further diagnostic evaluation for alcohol dependence and treatment.   CLINICAL FACTORS:   Depression:   Hopelessness Alcohol/Substance Abuse/Dependencies Previous Psychiatric Diagnoses and Treatments   Musculoskeletal: Strength & Muscle Tone:  lying down on assessment Gait & Station:  lying down on assessment Patient  leans:  lying down on assessment  Psychiatric Specialty Exam:  Presentation  General Appearance:  Appropriate for Environment  Eye Contact: Minimal  Speech: Slow  Speech Volume: Normal  Handedness: Right   Mood and Affect  Mood: -- ("tired")  Affect: Congruent   Thought Process  Thought Processes: Coherent  Descriptions of Associations:Intact  Orientation:Full (Time, Place and Person)  Thought Content:Logical  History of Schizophrenia/Schizoaffective disorder:Yes  Duration of Psychotic Symptoms:N/A  Hallucinations:Hallucinations: None Description of Auditory Hallucinations: Vague, reports hearing negative voices telling him things that are bad Description of Visual Hallucinations: Vague, reports seeing shadows  Ideas of Reference:None  Suicidal Thoughts:Suicidal Thoughts: No SI Active Intent and/or Plan: With Intent; With Plan; With Means to Carry Out; With Access to Means  Homicidal Thoughts:Homicidal Thoughts: No   Sensorium  Memory: Immediate Fair  Judgment: Impaired  Insight: Present   Executive Functions  Concentration: Poor  Attention Span: Poor  Recall: Poor  Fund of Knowledge: Fair  Language: Fair   Psychomotor Activity  Psychomotor Activity: Psychomotor Activity: Other (comment)   Assets  Assets: Communication Skills; Desire for Improvement; Financial Resources/Insurance; Resilience   Sleep  Sleep: Sleep: Good    Physical Exam: Physical Exam see admission note ROS see admission note Blood pressure (!) 130/98, pulse 72, resp. rate 17, height 5\' 5"  (1.651 m), weight 83.9 kg, SpO2 99%. Body mass index is 30.79 kg/m.   COGNITIVE FEATURES THAT CONTRIBUTE TO RISK:  None    SUICIDE RISK:   Moderate:  Frequent suicidal ideation with limited intensity, and duration, some specificity in terms of plans, no associated intent, good self-control, limited dysphoria/symptomatology, some risk factors present, and  identifiable protective factors, including available and accessible social support.  PLAN OF CARE:  Daily contact with patient to assess and evaluate symptoms and  progress in treatment, medication management  I certify that inpatient services furnished can reasonably be expected to improve the patient's condition.   Lauree Chandler, NP 12/16/2022, 1:20 PM
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
## 2022-12-16 NOTE — Progress Notes (Signed)
D- Patient alert and oriented. Patient presented in a drowsy, but pleasant mood on assessment stating that he slept ok last night, I'm a little woozy this morning". Patient had no complaints to voice to this Clinical research associate. Patient endorsed some depression and anxiety, stating that "being away from home" and being "locked up in here", which is why he's feeling this way. Patient denied SI, HI, AVH, and pain at this time. Patient had no stated goals for today.  A- Scheduled medications administered to patient, per MD orders. Support and encouragement provided. Routine safety checks conducted every 15 minutes. Patient informed to notify staff with problems or concerns.  R- No adverse drug reactions noted. Patient contracts for safety at this time. Patient compliant with medications and treatment plan. Patient receptive, calm, and cooperative. Patient has been asleep majority of the day, stating that the medication he got lat night has had him knocked out. Patient remains safe at this time.   12/16/22 1400  Psych Admission Type (Psych Patients Only)  Admission Status Involuntary  Psychosocial Assessment  Patient Complaints Anxiety;Depression  Eye Contact Brief  Facial Expression Sullen  Affect Other (Comment) (per pt. drowsy)  Speech Logical/coherent;Soft  Interaction Assertive;Isolative (pt. reports being drowsy, so he has been in bed majority of the day, except for meals.)  Motor Activity Slow  Appearance/Hygiene In scrubs  Behavior Characteristics Cooperative;Appropriate to situation  Mood Pleasant  Aggressive Behavior  Effect No apparent injury  Thought Process  Coherency Circumstantial  Content WDL  Delusions None reported or observed  Perception WDL  Hallucination None reported or observed  Judgment WDL  Confusion None  Danger to Self  Current suicidal ideation? Denies  Agreement Not to Harm Self Yes  Description of Agreement Verbal  Danger to Others  Danger to Others None reported or  observed  Danger to Others Abnormal  Harmful Behavior to others No threats or harm toward other people  Destructive Behavior No threats or harm toward property

## 2022-12-16 NOTE — [facility] Counselor (Signed)
CSW attempted to complete PSA on patient.   Patient was in and out of sleep and unable to answer most questions.  No distress noted, patient complained of having medication.  CSW will attempt again when patient is more alert.   Penni Homans, MSW, LCSW 12/16/2022 1:59 PM

## 2022-12-16 NOTE — Group Note (Signed)
[facility] LCSW Group Therapy Note    Group Date: 12/16/2022 Start Time: 1329 End Time: 1420  Type of Therapy and Topic:  Group Therapy:  Overcoming Obstacles  Participation Level:  BHH PARTICIPATION LEVEL: Did Not Attend   Description of Group:   In this group patients will be encouraged to explore what they see as obstacles to their own wellness and recovery. They will be guided to discuss their thoughts, feelings, and behaviors related to these obstacles. The group will process together ways to cope with barriers, with attention given to specific choices patients can make. Each patient will be challenged to identify changes they are motivated to make in order to overcome their obstacles. This group will be process-oriented, with patients participating in exploration of their own experiences as well as giving and receiving support and challenge from other group members.  Therapeutic Goals: 1. Patient will identify personal and current obstacles as they relate to admission. 2. Patient will identify barriers that currently interfere with their wellness or overcoming obstacles.  3. Patient will identify feelings, thought process and behaviors related to these barriers. 4. Patient will identify two changes they are willing to make to overcome these obstacles:    Summary of Patient Progress X   Therapeutic Modalities:   Cognitive Behavioral Therapy Solution Focused Therapy Motivational Interviewing Relapse Prevention Therapy   Glenis Smoker, LCSW

## 2022-12-16 NOTE — Progress Notes (Signed)
Patient noted in his bed with his eyes closed, respiration even regular non labored,he appears without distress, will continue to monitor.

## 2022-12-16 NOTE — Plan of Care (Signed)
  Problem: Education: Goal: Knowledge of General Education information will improve Description Including pain rating scale, medication(s)/side effects and non-pharmacologic comfort measures Outcome: Progressing   

## 2022-12-16 NOTE — Progress Notes (Signed)
Patient refused scheduled Keppra stating that he hasn't taken any seizure medication, his whole life. Nps and MD notified via secure chat.

## 2022-12-17 ENCOUNTER — Ambulatory Visit: Payer: 59 | Admitting: Physical Therapy

## 2022-12-17 NOTE — Group Note (Deleted)
Date:  12/17/2022 Time:  1:54 PM  Group Topic/Focus:        Participation Level:  {BHH PARTICIPATION WUJWJ:19147}  Participation Quality:  {BHH PARTICIPATION QUALITY:22265}  Affect:  {BHH AFFECT:22266}  Cognitive:  {BHH COGNITIVE:22267}  Insight: {BHH Insight2:20797}  Engagement in Group:  {BHH ENGAGEMENT IN GROUP:22268}  Modes of Intervention:  {BHH MODES OF INTERVENTION:22269}  Additional Comments:  ***  Nance Mccombs 12/17/2022, 1:54 PM

## 2022-12-17 NOTE — Progress Notes (Signed)
 Patient calm and pleasant during assessment denying SI/HI/AVH. Pt observed interacting appropriately with staff and peers on the unit. Pt compliant with medication administration per MD orders. Pt given education, support, and encouragement to be active in his treatment plan. Pt being monitored Q 15 minutes for safety per unit protocol, remains safe on the unit  

## 2022-12-17 NOTE — Group Note (Signed)
LCSW Group Therapy Note   Group Date: 12/17/2022 Start Time: 1300 End Time: 1400   Type of Therapy and Topic:  Group Therapy: Boundaries  Participation Level:  Did Not Attend  Description of Group: This group will address the use of boundaries in their personal lives. Patients will explore why boundaries are important, the difference between healthy and unhealthy boundaries, and negative and postive outcomes of different boundaries and will look at how boundaries can be crossed.  Patients will be encouraged to identify current boundaries in their own lives and identify what kind of boundary is being set. Facilitators will guide patients in utilizing problem-solving interventions to address and correct types boundaries being used and to address when no boundary is being used. Understanding and applying boundaries will be explored and addressed for obtaining and maintaining a balanced life. Patients will be encouraged to explore ways to assertively make their boundaries and needs known to significant others in their lives, using other group members and facilitator for role play, support, and feedback.  Therapeutic Goals:  1.  Patient will identify areas in their life where setting clear boundaries could be  used to improve their life.  2.  Patient will identify signs/triggers that a boundary is not being respected. 3.  Patient will identify two ways to set boundaries in order to achieve balance in  their lives: 4.  Patient will demonstrate ability to communicate their needs and set boundaries  through discussion and/or role plays  Summary of Patient Progress:   Patient declined to attend group, despite encouragement from this clinician.  Therapeutic Modalities:   Cognitive Behavioral Therapy Solution-Focused Therapy  Claudie Fisherman 12/17/2022  2:51 PM

## 2022-12-17 NOTE — Group Note (Signed)
Recreation Therapy Group Note   Group Topic:Healthy Support Systems  Group Date: 12/17/2022 Start Time: 1000 End Time: 1100 Facilitators: Rosina Lowenstein, LRT, CTRS Location:  Craft Room  Group Description: Straw Bridge.  Patients were given 10 plastic drinking straws and an equal length of masking tape. Using the materials provided, patients were instructed to build a free-standing bridge-like structure to suspend an everyday item (ex: deck of cards) off the floor or table surface. All materials were required to be used in Secondary school teacher. LRT facilitated post-activity discussion reviewing the importance of having strong and healthy support systems in our lives. LRT discussed how the people in our lives serve as the tape and the deck of cards we placed on top of our straw structure are the stressors we face in daily life. LRT and pts discussed what happens in our life when things get too heavy for [place], and we don't have strong supports outside of the hospital. Pt shared 2 of their healthy supports aloud in the group.   Goal Area(s) Addressed:  Patient will identify 2 healthy supports in their life. Patient will identify skills to successfully complete activity. Patient will identify correlation of this activity to life post-discharge.  Patient will work on Product manager.   Affect/Mood: N/A   Participation Level: Did not attend    Clinical Observations/Individualized Feedback: Charles Estes did not attend group.  Plan: Continue to engage patient in RT group sessions 2-3x/week.   Rosina Lowenstein, LRT, CTRS 12/17/2022 11:23 AM
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
## 2022-12-17 NOTE — Group Note (Signed)
Date:  12/17/2022 Time:  9:35 PM  Group Topic/Focus:  Wrap-Up Group:   The focus of this group is to help patients review their daily goal of treatment and discuss progress on daily workbooks.    Participation Level:  Active  Participation Quality:  Appropriate and Attentive  Affect:  Appropriate  Cognitive:  Alert and Appropriate  Insight: Good  Engagement in Group:  Developing/Improving and Engaged  Modes of Intervention:  Limit-setting  Additional Comments:     Orena Cavazos 12/17/2022, 9:35 PM
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
## 2022-12-17 NOTE — Progress Notes (Signed)
Patient is pleasant   12/17/22 1010  Psych Admission Type (Psych Patients Only)  Admission Status Involuntary  Psychosocial Assessment  Patient Complaints Anxiety;Depression  Eye Contact Brief  Facial Expression Sullen  Affect Anxious  Speech Logical/coherent  Interaction Assertive  Motor Activity Slow  Appearance/Hygiene Unremarkable  Behavior Characteristics Cooperative  Mood Pleasant  Thought Process  Coherency Circumstantial  Content WDL  Delusions None reported or observed  Perception WDL  Hallucination None reported or observed  Judgment WDL  Confusion None  Danger to Self  Current suicidal ideation? Denies  Agreement Not to Harm Self Yes  Description of Agreement Verbal  Danger to Others  Danger to Others None reported or observed  Danger to Others Abnormal  Harmful Behavior to others No threats or harm toward other people  Destructive Behavior No threats or harm toward property   Patient is alert and oriented.  Patient denies SI/HI/AVH.  Patient out of his room for meals.  Otherwise spent the day in his room in bed.  Patient is pleasant, cooperative, and compliant

## 2022-12-17 NOTE — Group Note (Signed)
Date:  12/17/2022 Time:  11:11 AM  Group Topic/Focus:  Goals Group:   The focus of this group is to help patients establish daily goals to achieve during treatment and discuss how the patient can incorporate goal setting into their daily lives to aide in recovery.    Participation Level:  Did Not Attend   Lynelle Smoke [facility] 12/17/2022, 11:11 AM

## 2022-12-17 NOTE — Plan of Care (Signed)
   Problem: Safety: Goal: Ability to remain free from injury will improve Outcome: Progressing   

## 2022-12-17 NOTE — Group Note (Signed)
Date:  12/17/2022 Time:  6:11 PM  Group Topic/Focus:  Activity Group    Participation Level:  Did Not Attend   Charles [facility] A [name] 12/17/2022, 6:11 PM

## 2022-12-17 NOTE — BHH Counselor (Signed)
CSW has provided patient with [facility] list for: [place], [place], [place], [place] and [place] counties.    CSW has also provided patient with the contact information for [facility] of [place].  CSW reached out to community mental health liaison Smitty Cords with [facility] about speaking with the patient on [facility], waiting follow up.  Patient is declining long-term treatment at this time.   Penni Homans, MSW, LCSW 12/17/2022 3:07 PM

## 2022-12-18 NOTE — Progress Notes (Signed)
D- Patient alert and oriented. Patient presented in a pleasant mood on assessment reporting that he slept poor last night, "the Boogeyman came to see me last night, "it was 7 o'clock when he turned me loose. Wherever I'm at he'll find me". Patient had no complaints to voice to this Clinical research associate. Patient endorsed depression and anxiety stating that "I need to find me a place to stay, so that I can stop sleeping in my car". Patient denied SI, HI, AVH, and pain at this time, stating "naw, not at the moment". Patient had no stated goals for today.  A- Scheduled medications administered to patient, per MD orders. Support and encouragement provided. Routine safety checks conducted every 15 minutes. Patient informed to notify staff with problems or concerns.  R- No adverse drug reactions noted. Patient contracts for safety at this time. Patient compliant with medications and treatment plan. Patient receptive, calm, and cooperative. Patient interacts well with others on the unit. Patient remains safe at this time.   12/18/22 0900  Psych Admission Type (Psych Patients Only)  Admission Status Involuntary  Psychosocial Assessment  Patient Complaints Sleep disturbance;Anxiety;Depression  Eye Contact Fair  Facial Expression Worried  Affect Preoccupied  Speech Soft  Interaction Assertive  Motor Activity Slow  Appearance/Hygiene In scrubs  Behavior Characteristics Cooperative;Appropriate to situation  Mood Preoccupied;Pleasant  Aggressive Behavior  Effect No apparent injury  Thought Process  Coherency Circumstantial  Content Delusions;Preoccupation;Paranoia  Delusions Paranoid;Persecutory  Perception Hallucinations;Derealization  Hallucination Auditory  Judgment Limited  Confusion Mild  Danger to Self  Current suicidal ideation? Denies  Self-Injurious Behavior No self-injurious ideation or behavior indicators observed or expressed   Agreement Not to Harm Self Yes  Description of Agreement Verbal   Danger to Others  Danger to Others None reported or observed  Danger to Others Abnormal  Harmful Behavior to others No threats or harm toward other people  Destructive Behavior No threats or harm toward property

## 2022-12-18 NOTE — BHH Suicide Risk Assessment (Signed)
B[facility] INPATIENT:  Family/Significant Other Suicide Prevention Education  Suicide Prevention Education:  Contact Attempts: Mathews Robinsons, daughter, [phone] has been identified by the patient as the family member/significant other with whom the patient will be residing, and identified as the person(s) who will aid the patient in the event of a mental health crisis.  With written consent from the patient, two attempts were made to provide suicide prevention education, prior to and/or following the patient's discharge.  We were unsuccessful in providing suicide prevention education.  A suicide education pamphlet was given to the patient to share with family/significant other.  Date and time of first attempt:12/18/2022 at 1:20PM Date and time of second attempt: Second attempt is needed  Second attempt is needed.  CSW left HIPAA compliant voicemail.  Harden Mo 12/18/2022, 1:20 PM

## 2022-12-18 NOTE — Plan of Care (Signed)
  Problem: Education: Goal: Knowledge of General Education information will improve Description: Including pain rating scale, medication(s)/side effects and non-pharmacologic comfort measures Outcome: Not Progressing   Problem: Health Behavior/Discharge Planning: Goal: Ability to manage health-related needs will improve Outcome: Not Progressing   Problem: Clinical Measurements: Goal: Ability to maintain clinical measurements within normal limits will improve Outcome: Not Progressing Goal: Will remain free from infection Outcome: Not Progressing Goal: Diagnostic test results will improve Outcome: Not Progressing Goal: Respiratory complications will improve Outcome: Not Progressing Goal: Cardiovascular complication will be avoided Outcome: Not Progressing   Problem: Activity: Goal: Risk for activity intolerance will decrease Outcome: Not Progressing   Problem: Nutrition: Goal: Adequate nutrition will be maintained Outcome: Not Progressing   Problem: Coping: Goal: Level of anxiety will decrease Outcome: Not Progressing   Problem: Elimination: Goal: Will not experience complications related to bowel motility Outcome: Not Progressing Goal: Will not experience complications related to urinary retention Outcome: Not Progressing   Problem: Pain Managment: Goal: General experience of comfort will improve Outcome: Not Progressing   Problem: Safety: Goal: Ability to remain free from injury will improve Outcome: Not Progressing   Problem: Skin Integrity: Goal: Risk for impaired skin integrity will decrease Outcome: Not Progressing   Problem: Education: Goal: Knowledge of [place] General Education information/materials will improve Outcome: Not Progressing Goal: Emotional status will improve Outcome: Not Progressing Goal: Mental status will improve Outcome: Not Progressing Goal: Verbalization of understanding the information provided will improve Outcome: Not  Progressing   Problem: Activity: Goal: Interest or engagement in activities will improve Outcome: Not Progressing Goal: Sleeping patterns will improve Outcome: Not Progressing   Problem: Coping: Goal: Ability to verbalize frustrations and anger appropriately will improve Outcome: Not Progressing Goal: Ability to demonstrate self-control will improve Outcome: Not Progressing   Problem: Health Behavior/Discharge Planning: Goal: Identification of resources available to assist in meeting health care needs will improve Outcome: Not Progressing Goal: Compliance with treatment plan for underlying cause of condition will improve Outcome: Not Progressing   Problem: Physical Regulation: Goal: Ability to maintain clinical measurements within normal limits will improve Outcome: Not Progressing   Problem: Safety: Goal: Periods of time without injury will increase Outcome: Not Progressing   

## 2022-12-18 NOTE — Progress Notes (Signed)
 Patient calm and pleasant during assessment denying SI/HI/AVH. Pt observed interacting appropriately with staff and peers on the unit. Pt compliant with medication administration per MD orders. Pt given education, support, and encouragement to be active in his treatment plan. Pt being monitored Q 15 minutes for safety per unit protocol, remains safe on the unit  

## 2022-12-18 NOTE — Plan of Care (Signed)
Patient denies SI/HI/AVH  Problem: Education: Goal: Knowledge of General Education information will improve Description: Including pain rating scale, medication(s)/side effects and non-pharmacologic comfort measures Outcome: Progressing   Problem: Health Behavior/Discharge Planning: Goal: Ability to manage health-related needs will improve Outcome: Progressing   Problem: Clinical Measurements: Goal: Ability to maintain clinical measurements within normal limits will improve Outcome: Progressing Goal: Will remain free from infection Outcome: Progressing Goal: Diagnostic test results will improve Outcome: Progressing Goal: Respiratory complications will improve Outcome: Progressing Goal: Cardiovascular complication will be avoided Outcome: Progressing   Problem: Activity: Goal: Risk for activity intolerance will decrease Outcome: Progressing   Problem: Nutrition: Goal: Adequate nutrition will be maintained Outcome: Progressing   Problem: Coping: Goal: Level of anxiety will decrease Outcome: Progressing   Problem: Elimination: Goal: Will not experience complications related to bowel motility Outcome: Progressing Goal: Will not experience complications related to urinary retention Outcome: Progressing   Problem: Pain Managment: Goal: General experience of comfort will improve Outcome: Progressing   Problem: Safety: Goal: Ability to remain free from injury will improve Outcome: Progressing   Problem: Skin Integrity: Goal: Risk for impaired skin integrity will decrease Outcome: Progressing   Problem: Education: Goal: Knowledge of [place] General Education information/materials will improve Outcome: Progressing Goal: Emotional status will improve Outcome: Progressing Goal: Mental status will improve Outcome: Progressing Goal: Verbalization of understanding the information provided will improve Outcome: Progressing   Problem: Activity: Goal: Interest or  engagement in activities will improve Outcome: Progressing Goal: Sleeping patterns will improve Outcome: Progressing   Problem: Coping: Goal: Ability to verbalize frustrations and anger appropriately will improve Outcome: Progressing Goal: Ability to demonstrate self-control will improve Outcome: Progressing   Problem: Health Behavior/Discharge Planning: Goal: Identification of resources available to assist in meeting health care needs will improve Outcome: Progressing Goal: Compliance with treatment plan for underlying cause of condition will improve Outcome: Progressing   Problem: Physical Regulation: Goal: Ability to maintain clinical measurements within normal limits will improve Outcome: Progressing   Problem: Safety: Goal: Periods of time without injury will increase Outcome: Progressing

## 2022-12-18 NOTE — Progress Notes (Signed)
[facility] - [place] MD Progress Note  12/18/2022 3:33 PM Charles Estes  MRN:  [id number] Subjective:  Pt chart reviewed and seen on rounds. Reports poor sleep last night due to nightmares about a "bogeyman". Reports euthymic mood. Denies suicidal, homicidal ideations. Denies auditory visual hallucinations or paranoia. Reports he has been in contact with [facility] and is seeing if he can go there after discharge. States if not, he is ok going back to living in his [name] or can go to [place] he shares with his brother in [place]. Reports his daughter and grandchildren are reasons to live. States he is also supposed to be getting a check, is hopeful it will come this week or next week.   Patient states his male friend, [name], [phone], can be called for collateral. Gives verbal consent for her to be contacted. Collateral was called. Per [name], denies safety concerns with patient being discharged. She states patient would not do anything to hurt himself or anyone else and that she is support for patient. She states she does not want to divulge details about their relationship. Patient later relayed that Angela's daughter has asked her to get a 50B restraining order against him. He is not sure yet if restraining order is in place and he and [name] continue to speak and meet. I discussed legal consequences of remaining in contact if 50B is in place. Patient verbalized understanding.   Principal Problem: MDD (major depressive disorder), recurrent, severe, with psychosis (HCC) Diagnosis: Principal Problem:   MDD (major depressive disorder), recurrent, severe, with psychosis (HCC)  Total Time spent with patient:  25 minutes  Past Psychiatric History: Cocaine use disorder, MDD, bipolar disorder, GAD  Past Medical History:  Past Medical History:  Diagnosis Date   Depression    GERD (gastroesophageal reflux disease)    Hypertension    Pathologic ulnar fracture with malunion    right    Past Surgical  History:  Procedure Laterality Date   MANDIBLE FRACTURE SURGERY  2014   NO PAST SURGERIES     ORIF ULNAR FRACTURE Right 07/16/2017   Procedure: OPEN REDUCTION INTERNAL FIXATION (ORIF) RIGHT ULNA FRACTURE NONUNION;  Surgeon: Tarry Kos, MD;  Location: [facility];  Service: Orthopedics;  Laterality: Right;   Family History:  Family History  Problem Relation Age of Onset   Hypertension Mother    Heart attack Mother    Hypertension Father    Heart attack Father    Heart attack Sister    Hypertension Sister    Heart attack Sister    Hypertension Brother    Family Psychiatric  History: None reported Social History:  Social History   Substance and Sexual Activity  Alcohol Use Yes   Comment: socially- past weekend reports 10 beers     Social History   Substance and Sexual Activity  Drug Use Yes   Types: Cocaine   Comment: occasionally    Social History   Socioeconomic History   Marital status: Divorced    Spouse name: Not on file   Number of children: Not on file   Years of education: 14   Highest education level: Associate degree: occupational, Scientist, product/process development, or vocational program  Occupational History   Not on file  Tobacco Use   Smoking status: Every Day    Current packs/day: 0.50    Average packs/day: 0.5 packs/day for 5.5 years (2.8 ttl pk-yrs)    Types: Cigarettes    Start date: 2019   Smokeless tobacco: Never  Vaping Use   Vaping status: Never Used  Substance and Sexual Activity   Alcohol use: Yes    Comment: socially- past weekend reports 10 beers   Drug use: Yes    Types: Cocaine    Comment: occasionally   Sexual activity: Yes    Birth control/protection: None  Other Topics Concern   Not on file  Social History Narrative   Not on file   Social Determinants of Health   Financial Resource Strain: Not on File (09/26/2021)   Received from [employer]    Financial Resource Strain: 0  Food Insecurity: Food  Insecurity Present (12/15/2022)   Hunger Vital Sign    Worried About Running Out of Food in the Last Year: Often true    Ran Out of Food in the Last Year: Often true  Transportation Needs: Unmet Transportation Needs (12/15/2022)   PRAPARE - Administrator, Civil Service (Medical): Yes    Lack of Transportation (Non-Medical): Yes  Physical Activity: Not on File (09/26/2021)   [facility] from [facility]   Physical Activity    Physical Activity: 0  Stress: Not on File (09/26/2021)   Received from [facility]   Stress    Stress: 0  Social Connections: Unknown (10/15/2021)   Received from [employer]   Social Network    Social Network: Not on file   Additional Social History:                         Sleep: Poor  Appetite:  Fair  Current Medications: Current Facility-Administered Medications  Medication Dose Route Frequency Provider Last Rate Last Admin   acetaminophen (TYLENOL) tablet 650 mg  650 mg Oral Q6H PRN Lenox Ponds, NP       alum & mag hydroxide-simeth (MAALOX/MYLANTA) 200-200-20 MG/5ML suspension 30 mL  30 mL Oral Q4H PRN Lenox Ponds, NP       amLODipine (NORVASC) tablet 10 mg  10 mg Oral Daily Lenox Ponds, NP   10 mg at 12/18/22 0859   apixaban (ELIQUIS) tablet 5 mg  5 mg Oral BID Lenox Ponds, NP   5 mg at 12/18/22 0859   ARIPiprazole (ABILIFY) tablet 10 mg  10 mg Oral Daily Lenox Ponds, NP   10 mg at 12/18/22 0859   diphenhydrAMINE (BENADRYL) capsule 50 mg  50 mg Oral TID PRN Lenox Ponds, NP       Or   diphenhydrAMINE (BENADRYL) injection 50 mg  50 mg Intramuscular TID PRN Lenox Ponds, NP       gabapentin (NEURONTIN) capsule 300 mg  300 mg Oral QHS Lenox Ponds, NP   300 mg at 12/17/22 2104   haloperidol (HALDOL) tablet 5 mg  5 mg Oral TID PRN Lenox Ponds, NP       Or   haloperidol lactate (HALDOL) injection 5 mg  5 mg Intramuscular TID PRN Lenox Ponds, NP       hydrOXYzine (ATARAX) tablet 25 mg  25 mg Oral TID  PRN Lenox Ponds, NP       LORazepam (ATIVAN) tablet 2 mg  2 mg Oral TID PRN Lenox Ponds, NP       Or   LORazepam (ATIVAN) injection 2 mg  2 mg Intramuscular TID PRN Lenox Ponds, NP       magnesium hydroxide (MILK OF MAGNESIA) suspension 30 mL  30 mL Oral Daily  PRN Lenox Ponds, NP       nicotine (NICODERM CQ - dosed in mg/24 hours) patch 14 mg  14 mg Transdermal Q0600 Lenox Ponds, NP       pantoprazole (PROTONIX) EC tablet 40 mg  40 mg Oral Daily Lenox Ponds, NP   40 mg at 12/18/22 0859   traZODone (DESYREL) tablet 50 mg  50 mg Oral QHS PRN Lenox Ponds, NP        Lab Results: No results found for this or any previous visit (from the past 48 hour(s)).  Blood Alcohol level:  Lab Results  Component Value Date   ETH <10 12/15/2022   ETH <10 11/19/2022    Metabolic Disorder Labs: Lab Results  Component Value Date   HGBA1C 6.0 (H) 10/21/2022   MPG 125.5 10/21/2022   MPG 134 08/28/2022   No results found for: "PROLACTIN" Lab Results  Component Value Date   CHOL 185 10/21/2022   TRIG 103 10/21/2022   HDL 32 (L) 10/21/2022   CHOLHDL 5.8 10/21/2022   VLDL 21 10/21/2022   LDLCALC 132 (H) 10/21/2022   LDLCALC 142 (H) 08/23/2022   Physical Findings: AIMS:  , ,  ,  ,    CIWA:    COWS:     Musculoskeletal: Strength & Muscle Tone:  pt lying down on assessment Gait & Station:  pt lying down on assessment Patient leans:  pt lying down on assessment  Psychiatric Specialty Exam:  Presentation  General Appearance:  Appropriate for Environment  Eye Contact: Fair  Speech: Clear and Coherent; Slow  Speech Volume: Normal  Handedness: Right  Mood and Affect  Mood: Euthymic  Affect: Blunt  Thought Process  Thought Processes: Coherent; Goal Directed; Linear  Descriptions of Associations:Intact  Orientation:Full (Time, Place and Person)  Thought Content:Logical  History of Schizophrenia/Schizoaffective disorder:Yes  Duration of  Psychotic Symptoms:N/A  Hallucinations:Hallucinations: None  Ideas of Reference:None  Suicidal Thoughts:Suicidal Thoughts: No  Homicidal Thoughts:Homicidal Thoughts: No  Sensorium  Memory: Immediate Fair  Judgment: Fair  Insight: Fair  Art therapist  Concentration: Fair  Attention Span: Fair  Recall: [employer] of Knowledge: Fair  Language: Fair  Psychomotor Activity  Psychomotor Activity: Psychomotor Activity: Normal  Assets  Assets: Communication Skills; Desire for Improvement; Financial Resources/Insurance; Resilience  Sleep  Sleep: Sleep: Poor  Physical Exam: Physical Exam Constitutional:      General: He is not in acute distress.    Appearance: He is not ill-appearing, toxic-appearing or diaphoretic.  Eyes:     General: No scleral icterus. Cardiovascular:     Rate and Rhythm: Normal rate.  Pulmonary:     Effort: Pulmonary effort is normal. No respiratory distress.  Neurological:     Mental Status: He is alert and oriented to person, place, and time.  Psychiatric:        Attention and Perception: Attention and perception normal.        Mood and Affect: Mood normal. Affect is blunt.        Speech: Speech normal.        Behavior: Behavior normal. Behavior is cooperative.        Thought Content: Thought content normal.        Cognition and Memory: Cognition and memory normal.        Judgment: Judgment normal.    Review of Systems  Constitutional:  Negative for chills and fever.  Respiratory:  Negative for shortness of breath.   Cardiovascular:  Negative  for chest pain and palpitations.  Gastrointestinal:  Negative for abdominal pain.  Neurological:  Negative for headaches.  Psychiatric/Behavioral: Negative.     Blood pressure (!) 131/98, pulse 79, temperature 98.4 F (36.9 C), temperature source Oral, resp. rate 17, height 5\' 5"  (1.651 m), weight 83.9 kg, SpO2 96%. Body mass index is 30.79 kg/m.  Treatment Plan Summary: Daily  contact with patient to assess and evaluate symptoms and progress in treatment, Medication management, and Plan continue current medications. Expected discharge 1 to 2 days.  Lauree Chandler, NP 12/18/2022, 3:33 PM

## 2022-12-18 NOTE — Group Note (Signed)
Date:  12/18/2022 Time:  5:49 PM  Group Topic/Focus:  Activity Group:  The focus of the group is to encourage the patients to come outside to the courtyard to get some fresh air to help assist their physical and mental wellbeing.    Participation Level:  Active  Participation Quality:  Appropriate  Affect:  Appropriate  Cognitive:  Appropriate  Insight: Appropriate  Engagement in Group:  Engaged  Modes of Intervention:  Activity  Additional Comments:    Mary Sella Kassie Keng 12/18/2022, 5:49 PM

## 2022-12-18 NOTE — Plan of Care (Signed)
Patient denies SI/HI/AVH, stated to this writer that he feels ready to D/C tomorrow   Problem: Education: Goal: Knowledge of General Education information will improve Description: Including pain rating scale, medication(s)/side effects and non-pharmacologic comfort measures Outcome: Progressing   Problem: Health Behavior/Discharge Planning: Goal: Ability to manage health-related needs will improve Outcome: Progressing   Problem: Clinical Measurements: Goal: Ability to maintain clinical measurements within normal limits will improve Outcome: Progressing Goal: Will remain free from infection Outcome: Progressing Goal: Diagnostic test results will improve Outcome: Progressing Goal: Respiratory complications will improve Outcome: Progressing Goal: Cardiovascular complication will be avoided Outcome: Progressing   Problem: Activity: Goal: Risk for activity intolerance will decrease Outcome: Progressing   Problem: Nutrition: Goal: Adequate nutrition will be maintained Outcome: Progressing   Problem: Coping: Goal: Level of anxiety will decrease Outcome: Progressing   Problem: Elimination: Goal: Will not experience complications related to bowel motility Outcome: Progressing Goal: Will not experience complications related to urinary retention Outcome: Progressing   Problem: Pain Managment: Goal: General experience of comfort will improve Outcome: Progressing   Problem: Safety: Goal: Ability to remain free from injury will improve Outcome: Progressing   Problem: Skin Integrity: Goal: Risk for impaired skin integrity will decrease Outcome: Progressing   Problem: Education: Goal: Knowledge of [place] General Education information/materials will improve Outcome: Progressing Goal: Emotional status will improve Outcome: Progressing Goal: Mental status will improve Outcome: Progressing Goal: Verbalization of understanding the information provided will  improve Outcome: Progressing   Problem: Activity: Goal: Interest or engagement in activities will improve Outcome: Progressing Goal: Sleeping patterns will improve Outcome: Progressing   Problem: Coping: Goal: Ability to verbalize frustrations and anger appropriately will improve Outcome: Progressing Goal: Ability to demonstrate self-control will improve Outcome: Progressing   Problem: Health Behavior/Discharge Planning: Goal: Identification of resources available to assist in meeting health care needs will improve Outcome: Progressing Goal: Compliance with treatment plan for underlying cause of condition will improve Outcome: Progressing   Problem: Physical Regulation: Goal: Ability to maintain clinical measurements within normal limits will improve Outcome: Progressing   Problem: Safety: Goal: Periods of time without injury will increase Outcome: Progressing

## 2022-12-18 NOTE — Group Note (Signed)
Recreation Therapy Group Note   Group Topic:Problem Solving  Group Date: 12/18/2022 Start Time: 1000 End Time: 1100 Facilitators: Rosina Lowenstein, LRT, CTRS Location:  Craft Room  Group Description: Life Boat. Patients were given the scenario that they are on a boat that is about to become shipwrecked, leaving them stranded on an [place]. They are asked to make a list of 15 different items that they want to take with them when they are stranded on the [place]. Patients are asked to rank their items from most important to least important, #1 being the most important and #15 being the least. Patients will work individually for the first round to come up with 15 items and then as a group to condense their list and come up with one list of 15 items between all of them. Patients or LRT will read aloud the 15 different items to the group after each round. LRT facilitated post-activity processing to discuss how this activity can be used in daily life post discharge.   Goal Area(s) Addressed:  Patient will identify priorities, wants and needs. Patient will communicate with LRT and peers. Patient will work collectively as a Administrator, Civil Service. Patient will work on Product manager.    Affect/Mood: N/A   Participation Level: Did not attend    Clinical Observations/Individualized Feedback: Tyvon did not attend group.  Plan: Continue to engage patient in RT group sessions 2-3x/week.   Rosina Lowenstein, LRT, CTRS 12/18/2022 11:23 AM

## 2022-12-19 MED ORDER — APIXABAN 5 MG PO TABS: 5.0000 mg | ORAL_TABLET | Freq: Two times a day (BID) | ORAL | 0 refills | Status: DC

## 2022-12-19 MED ORDER — GABAPENTIN 300 MG PO CAPS: 300.0000 mg | ORAL_CAPSULE | Freq: Every day | ORAL | 0 refills | Status: DC

## 2022-12-19 MED ORDER — DOCUSATE SODIUM 100 MG PO CAPS
100.0000 mg | ORAL_CAPSULE | Freq: Every day | ORAL | Status: DC | PRN
  Administered 2022-12-19: 100 mg via ORAL
  Filled 2022-12-19: qty 1

## 2022-12-19 MED ORDER — ARIPIPRAZOLE 10 MG PO TABS: 10.0000 mg | ORAL_TABLET | Freq: Every day | ORAL | 0 refills | Status: DC

## 2022-12-19 MED ORDER — PANTOPRAZOLE SODIUM 40 MG PO TBEC: 40.0000 mg | DELAYED_RELEASE_TABLET | Freq: Every day | ORAL | 0 refills | Status: DC

## 2022-12-19 MED ORDER — AMLODIPINE BESYLATE 10 MG PO TABS: 10.0000 mg | ORAL_TABLET | Freq: Every day | ORAL | 0 refills | Status: DC

## 2022-12-19 NOTE — Progress Notes (Signed)
  [facility] Adult Case Management Discharge Plan :  Will you be returning to the same living situation after discharge:  No. At discharge, do you have transportation home?: Yes,  CSW to assist with transportation needs. Do you have the ability to pay for your medications: Yes,  [employer] MEDICARE / Suan Halter DUAL COMPLETE  Release of information consent forms completed and in the chart;  Patient's signature needed at discharge.  Patient to Follow up at:  Follow-up Information     [facility] Follow up.   Specialty: Professional Counselor Why: Walk in hours are from 8AM to [employer] information: [employer] of the [place] [address] [place] [address] [phone]                 Next level of care provider has access to [facility] Link:no  Safety Planning and Suicide Prevention discussed: Yes,  SPE completed with the patient and his daughter.      Has patient been referred to the Quitline?: Patient refused referral for treatment  Patient has been referred for addiction treatment: Patient refused referral for treatment; referral information given to patient at discharge.  Harden Mo, LCSW 12/19/2022, 10:11 AM

## 2022-12-19 NOTE — Progress Notes (Signed)
D- Patient alert and oriented. Patient presents in a pleasant mood on assessment stating that he slept "good" last night and had no complaints to voice to this Clinical research associate. Patient endorsed depression, anxiety, and hopelessness stating "just a little bit". Patient denies SI, HI, AVH, and pain at this time. Patient's goal for today is "myself and family", in which he will "pray", in order to achieve his goal.  A- Scheduled medications administered to patient, per MD orders. Support and encouragement provided. Routine safety checks conducted every 15 minutes. Patient informed to notify staff with problems or concerns.  R- No adverse drug reactions noted. Patient contracts for safety at this time. Patient compliant with medications and treatment plan. Patient receptive, calm, and cooperative. Patient interacts well with others on the unit. Patient remains safe at this time.

## 2022-12-19 NOTE — Progress Notes (Addendum)
Suicide Risk Assessment  Discharge Assessment    [facility] Discharge Suicide Risk Assessment   Principal Problem: MDD (major depressive disorder), recurrent, severe, with psychosis (HCC) Discharge Diagnoses: Principal Problem:   MDD (major depressive disorder), recurrent, severe, with psychosis (HCC)   Total Time spent with patient: 30 minutes  Musculoskeletal: Strength & Muscle Tone: within normal limits Gait & Station: normal Patient leans: N/A  Psychiatric Specialty Exam  Presentation  General Appearance:  Appropriate for Environment  Eye Contact: Fair  Speech: Clear and Coherent; Slow  Speech Volume: Normal  Handedness: Right   Mood and Affect  Mood: Euthymic  Duration of Depression Symptoms: Endorses euthymic mood at discharge  Affect: Blunt   Thought Process  Thought Processes: Coherent; Goal Directed; Linear  Descriptions of Associations:Intact  Orientation:Full (Time, Place and Person)  Thought Content:Logical  History of Schizophrenia/Schizoaffective disorder:Yes  Duration of Psychotic Symptoms:N/A  Hallucinations:Hallucinations: None  Ideas of Reference:None  Suicidal Thoughts:Suicidal Thoughts: No  Homicidal Thoughts:Homicidal Thoughts: No   Sensorium  Memory: Immediate Fair  Judgment: Fair  Insight: Fair   Art therapist  Concentration: Fair  Attention Span: Fair  Recall: [employer] of Knowledge: Fair  Language: Fair   Psychomotor Activity  Psychomotor Activity: Psychomotor Activity: Normal   Assets  Assets: Communication Skills; Desire for Improvement; Financial Resources/Insurance; Resilience   Sleep  Sleep: Sleep: Fair   Physical Exam: Physical Exam see discharge summary ROS see discharge summary Blood pressure (!) 137/94, pulse 81, temperature 98.5 F (36.9 C), temperature source Oral, resp. rate 19, height 5\' 5"  (1.651 m), weight 83.9 kg, SpO2 97%. Body mass index is 30.79  kg/m.  Mental Status Per Nursing Assessment::   On Admission:  Suicide plan  Demographic Factors:  Male, Low socioeconomic status, Living alone, and Unemployed  Loss Factors: Legal issues  Historical Factors: Prior suicide attempts  Risk Reduction Factors:   Sense of responsibility to family and Positive social support  Continued Clinical Symptoms:  Alcohol/Substance Abuse/Dependencies Previous Psychiatric Diagnoses and Treatments  Cognitive Features That Contribute To Risk:  None    Suicide Risk:  Minimal: No identifiable suicidal ideation.  Patients presenting with no risk factors but with morbid ruminations; may be classified as minimal risk based on the severity of the depressive symptoms  Plan Of Care/Follow-up recommendations:  Follow up with outpatient psychiatry and counseling  Lauree Chandler, NP 12/19/2022, 9:17 AM

## 2022-12-19 NOTE — Plan of Care (Signed)
  Problem: Education: Goal: Knowledge of General Education information will improve Description: Including pain rating scale, medication(s)/side effects and non-pharmacologic comfort measures Outcome: Adequate for Discharge   Problem: Health Behavior/Discharge Planning: Goal: Ability to manage health-related needs will improve Outcome: Adequate for Discharge   Problem: Clinical Measurements: Goal: Ability to maintain clinical measurements within normal limits will improve Outcome: Adequate for Discharge Goal: Will remain free from infection Outcome: Adequate for Discharge Goal: Diagnostic test results will improve Outcome: Adequate for Discharge Goal: Respiratory complications will improve Outcome: Adequate for Discharge Goal: Cardiovascular complication will be avoided Outcome: Adequate for Discharge   Problem: Activity: Goal: Risk for activity intolerance will decrease Outcome: Adequate for Discharge   Problem: Nutrition: Goal: Adequate nutrition will be maintained Outcome: Adequate for Discharge   Problem: Coping: Goal: Level of anxiety will decrease Outcome: Adequate for Discharge   Problem: Elimination: Goal: Will not experience complications related to bowel motility Outcome: Adequate for Discharge Goal: Will not experience complications related to urinary retention Outcome: Adequate for Discharge   Problem: Pain Managment: Goal: General experience of comfort will improve Outcome: Adequate for Discharge   Problem: Safety: Goal: Ability to remain free from injury will improve Outcome: Adequate for Discharge   Problem: Skin Integrity: Goal: Risk for impaired skin integrity will decrease Outcome: Adequate for Discharge   Problem: Education: Goal: Knowledge of [place] [facility] Education information/materials will improve Outcome: Adequate for Discharge Goal: Emotional status will improve Outcome: Adequate for Discharge Goal: Mental status will  improve Outcome: Adequate for Discharge Goal: Verbalization of understanding the information provided will improve Outcome: Adequate for Discharge   Problem: Activity: Goal: Interest or engagement in activities will improve Outcome: Adequate for Discharge Goal: Sleeping patterns will improve Outcome: Adequate for Discharge   Problem: Coping: Goal: Ability to verbalize frustrations and anger appropriately will improve Outcome: Adequate for Discharge Goal: Ability to demonstrate self-control will improve Outcome: Adequate for Discharge   Problem: Health Behavior/Discharge Planning: Goal: Identification of resources available to assist in meeting health care needs will improve Outcome: Adequate for Discharge Goal: Compliance with treatment plan for underlying cause of condition will improve Outcome: Adequate for Discharge   Problem: Physical Regulation: Goal: Ability to maintain clinical measurements within normal limits will improve Outcome: Adequate for Discharge   Problem: Safety: Goal: Periods of time without injury will increase Outcome: Adequate for Discharge   

## 2022-12-19 NOTE — Group Note (Signed)
Date:  12/19/2022 Time:  5:00 AM  Group Topic/Focus:  Healthy Communication:   The focus of this group is to discuss communication, barriers to communication, as well as healthy ways to communicate with others.    Participation Level:  Active  Participation Quality:  Appropriate and Attentive  Affect:  Appropriate  Cognitive:  Alert and Appropriate  Insight: Good  Engagement in Group:  Developing/Improving  Modes of Intervention:  Clarification, Discussion, Education, and Support  Additional Comments:     Emmitte Surgeon 12/19/2022, 5:00 AM

## 2022-12-19 NOTE — Care Management Important Message (Signed)
Important Message  Patient Details  Name: Charles Estes MRN: [id number] Date of Birth: [birth year]   Medicare Important Message Given:  Yes     Harden Mo, LCSW 12/19/2022, 10:00 AM

## 2022-12-19 NOTE — Progress Notes (Signed)
Patient ID: Charles Estes, male   DOB: [birth year], 62 y.o.   MRN: [id number]  Discharge Note:  Patient denies SI/HI/AVH at this time. Discharge instructions, AVS, prescriptions, and transition record gone over with patient. Patient did not want to fill out his Suicide Safety Plan. Patient agrees to comply with medication management, follow-up visit, and outpatient therapy. Patient belongings returned to patient. Patient questions and concerns addressed and answered. Patient ambulatory off unit. Patient discharged to home via [employer] services.

## 2022-12-19 NOTE — Discharge Summary (Signed)
Physician Discharge Summary Note  Patient:  Charles Estes is an 62 y.o., male MRN:  [id number] DOB:  [birth year] Patient phone:  [phone] (home)  Patient address:   [address] [place] [address],  Total Time spent with patient: 30 minutes  Date of Admission:  12/15/2022 Date of Discharge: 12/19/2022  Reason for Admission:  Pt chart reviewed and seen on rounds. Verbalizes readiness for discharge. Endorses euthymic mood. States he was informed his check has arrived. His plan is to get a hotel for a few nights and find more stable housing. States his daughter is going to help him manage his finances. He denies suicidal, homicidal ideations. He denies auditory visual hallucinations or paranoia. He states he plans on following up with outpatient psychiatry and counseling. Agrees to abstain from substance use.  Principal Problem: MDD (major depressive disorder), recurrent, severe, with psychosis (HCC) Discharge Diagnoses: Principal Problem:   MDD (major depressive disorder), recurrent, severe, with psychosis (HCC)   Past Psychiatric History: Cocaine use disorder, MDD, bipolar disorder, GAD  Past Medical History:  Past Medical History:  Diagnosis Date   Depression    GERD (gastroesophageal reflux disease)    Hypertension    Pathologic ulnar fracture with malunion    right    Past Surgical History:  Procedure Laterality Date   MANDIBLE FRACTURE SURGERY  2014   NO PAST SURGERIES     ORIF ULNAR FRACTURE Right 07/16/2017   Procedure: OPEN REDUCTION INTERNAL FIXATION (ORIF) RIGHT ULNA FRACTURE NONUNION;  Surgeon: Tarry Kos, MD;  Location: [facility];  Service: Orthopedics;  Laterality: Right;   Family History:  Family History  Problem Relation Age of Onset   Hypertension Mother    Heart attack Mother    Hypertension Father    Heart attack Father    Heart attack Sister    Hypertension Sister    Heart attack Sister    Hypertension Brother    Family  Psychiatric  History: None reported Social History:  Social History   Substance and Sexual Activity  Alcohol Use Yes   Comment: socially- past weekend reports 10 beers     Social History   Substance and Sexual Activity  Drug Use Yes   Types: Cocaine   Comment: occasionally    Social History   Socioeconomic History   Marital status: Divorced    Spouse name: Not on file   Number of children: Not on file   Years of education: 14   Highest education level: Associate degree: occupational, Scientist, product/process development, or vocational program  Occupational History   Not on file  Tobacco Use   Smoking status: Every Day    Current packs/day: 0.50    Average packs/day: 0.5 packs/day for 5.5 years (2.8 ttl pk-yrs)    Types: Cigarettes    Start date: 2019   Smokeless tobacco: Never  Vaping Use   Vaping status: Never Used  Substance and Sexual Activity   Alcohol use: Yes    Comment: socially- past weekend reports 10 beers   Drug use: Yes    Types: Cocaine    Comment: occasionally   Sexual activity: Yes    Birth control/protection: None  Other Topics Concern   Not on file  Social History Narrative   Not on file   Social Determinants of Health   Financial Resource Strain: Not on File (09/26/2021)   Received from [employer]    Financial Resource Strain: 0  Food Insecurity: Food Insecurity Present (  12/15/2022)   Hunger Vital Sign    Worried About Running Out of Food in the Last Year: Often true    Ran Out of Food in the Last Year: Often true  Transportation Needs: Unmet Transportation Needs (12/15/2022)   PRAPARE - Administrator, Civil Service (Medical): Yes    Lack of Transportation (Non-Medical): Yes  Physical Activity: Not on File (09/26/2021)   Received from [facility]   Physical Activity    Physical Activity: 0  Stress: Not on File (09/26/2021)   Received from [facility]   Stress    Stress: 0  Social Connections: Unknown (10/15/2021)   [facility]   Social Network    Social Network: Not on file    Hospital Course:  Charles Estes was admitted for MDD (major depressive disorder), recurrent, severe, with psychosis (HCC) and crisis management.  Kirklin Broder was discharged with current medication and was instructed on how to take medications as prescribed; (details listed below under Medication List).  Medical problems were identified and treated as needed.  Home medications were restarted as appropriate.  Improvement was monitored by observation and Salathiel Lingafelter daily report of symptom reduction.  Emotional and mental status was monitored by daily self-inventory reports completed by Charles Estes and clinical staff.         Romano Splawn was evaluated by the treatment team for stability and plans for continued recovery upon discharge.  Kimm Aldaba motivation was an integral factor for scheduling further treatment.  Employment, transportation, bed availability, health status, family support, and any pending legal issues were also considered during his hospital stay.  He was offered further treatment options upon discharge including but not limited to Residential, Intensive Outpatient, and Outpatient treatment.  Kassem Esquer will follow up with the services as listed below under Follow Up Information.     Upon completion of this admission the Trei Janssen was both mentally and medically stable for discharge denying suicidal/homicidal ideation, auditory/visual/tactile hallucinations, delusional thoughts and paranoia.       Physical Findings: AIMS:  , ,  ,  ,    CIWA:    COWS:     Musculoskeletal: Strength & Muscle Tone: within normal limits Gait & Station: normal Patient leans: N/A   Psychiatric Specialty Exam:  Presentation  General Appearance:  Appropriate for Environment  Eye Contact: Fair  Speech: Clear and Coherent; Slow  Speech Volume: Normal  Handedness: Right   Mood and Affect   Mood: Euthymic  Affect: Blunt   Thought Process  Thought Processes: Coherent; Goal Directed; Linear  Descriptions of Associations:Intact  Orientation:Full (Time, Place and Person)  Thought Content:Logical  History of Schizophrenia/Schizoaffective disorder:Yes  Duration of Psychotic Symptoms:N/A  Hallucinations:Hallucinations: None  Ideas of Reference:None  Suicidal Thoughts:Suicidal Thoughts: No  Homicidal Thoughts:Homicidal Thoughts: No   Sensorium  Memory: Immediate Fair  Judgment: Fair  Insight: Fair   Art therapist  Concentration: Fair  Attention Span: Fair  Recall: [employer] of Knowledge: Fair  Language: Fair   Psychomotor Activity  Psychomotor Activity: Psychomotor Activity: Normal   Assets  Assets: Communication Skills; Desire for Improvement; Financial Resources/Insurance; Resilience   Sleep  Sleep: Sleep: Fair    Physical Exam: Physical Exam Constitutional:      General: He is not in acute distress.    Appearance: He is not ill-appearing, toxic-appearing or diaphoretic.  Eyes:     General: No scleral icterus. Cardiovascular:     Rate and Rhythm: Normal rate.  Pulmonary:  Effort: Pulmonary effort is normal. No respiratory distress.  Neurological:     Mental Status: He is alert and oriented to person, place, and time.  Psychiatric:        Attention and Perception: Attention and perception normal.        Mood and Affect: Mood normal. Affect is blunt.        Speech: Speech normal.        Behavior: Behavior normal. Behavior is cooperative.        Thought Content: Thought content normal.        Cognition and Memory: Cognition and memory normal.        Judgment: Judgment normal.    Review of Systems  Constitutional:  Negative for chills and fever.  Respiratory:  Negative for shortness of breath.   Cardiovascular:  Negative for chest pain and palpitations.  Gastrointestinal:  Negative for abdominal pain.   Neurological:  Negative for headaches.  Psychiatric/Behavioral: Negative.     Blood pressure (!) 137/94, pulse 81, temperature 98.5 F (36.9 C), temperature source Oral, resp. rate 19, height 5\' 5"  (1.651 m), weight 83.9 kg, SpO2 97%. Body mass index is 30.79 kg/m.   Social History   Tobacco Use  Smoking Status Every Day   Current packs/day: 0.50   Average packs/day: 0.5 packs/day for 5.5 years (2.8 ttl pk-yrs)   Types: Cigarettes   Start date: 2019  Smokeless Tobacco Never   Tobacco Cessation:  A prescription for an FDA-approved tobacco cessation medication was offered at discharge and the patient refused   Blood Alcohol level:  Lab Results  Component Value Date   [facility] <10 12/15/2022   ETH <10 11/19/2022    Metabolic Disorder Labs:  Lab Results  Component Value Date   HGBA1C 6.0 (H) 10/21/2022   MPG 125.5 10/21/2022   MPG 134 08/28/2022   No results found for: "PROLACTIN" Lab Results  Component Value Date   CHOL 185 10/21/2022   TRIG 103 10/21/2022   HDL 32 (L) 10/21/2022   CHOLHDL 5.8 10/21/2022   VLDL 21 10/21/2022   LDLCALC 132 (H) 10/21/2022   LDLCALC 142 (H) 08/23/2022    See Psychiatric Specialty Exam and Suicide Risk Assessment completed by Attending Physician prior to discharge.  Discharge destination:  Home  Is patient on multiple antipsychotic therapies at discharge:  No   Has Patient had three or more failed trials of antipsychotic monotherapy by history:  No  Recommended Plan for Multiple Antipsychotic Therapies: NA   Allergies as of 12/19/2022       Reactions   Oatmeal Hives   Paxil [paroxetine] Diarrhea, Other (See Comments)   Mouth sores        Medication List     STOP taking these medications    acetaminophen 500 MG tablet Commonly known as: TYLENOL   levETIRAcetam 500 MG tablet Commonly known as: KEPPRA       TAKE these medications      Indication  amLODipine 10 MG tablet Commonly known as: NORVASC Take 1 tablet  (10 mg total) by mouth daily for 14 days.  Indication: High Blood Pressure Disorder   apixaban 5 MG Tabs tablet Commonly known as: ELIQUIS Take 1 tablet (5 mg total) by mouth 2 (two) times daily for 14 days.  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  Indication: Major Depressive  Disorder   gabapentin 300 MG capsule Commonly known as: NEURONTIN Take 1 capsule (300 mg total) by mouth at bedtime for 14 days.  Indication: Neuropathic Pain   pantoprazole 40 MG tablet Commonly known as: PROTONIX Take 1 tablet (40 mg total) by mouth daily for 14 days.  Indication: Gastroesophageal Reflux Disease       Follow-up recommendations:   Follow up with outpatient psychiatry and counseling Recommended to abstain from substance use  Signed: Lauree Chandler, NP 12/19/2022, 9:17 AM

## 2022-12-19 NOTE — [facility] Suicide Risk Assessment (Signed)
[facility] INPATIENT:  Family/Significant Other Suicide Prevention Education  Suicide Prevention Education:  Education Completed; Mathews Robinsons, daughter, [phone]  has been identified by the patient as the family member/significant other with whom the patient will be residing, and identified as the person(s) who will aid the patient in the event of a mental health crisis (suicidal ideations/suicide attempt).  With written consent from the patient, the family member/significant other has been provided the following suicide prevention education, prior to the and/or following the discharge of the patient.  The suicide prevention education provided includes the following: Suicide risk factors Suicide prevention and interventions National Suicide Hotline telephone number [facility] assessment telephone number [facility] Emergency Assistance 911 [facility] and/or Residential Mobile Crisis Unit telephone number  Request made of family/significant other to: Remove weapons (e.g., guns, rifles, knives), all items previously/currently identified as safety concern.   Remove drugs/medications (over-the-counter, prescriptions, illicit drugs), all items previously/currently identified as a safety concern.  The family member/significant other verbalizes understanding of the suicide prevention education information provided.  The family member/significant other agrees to remove the items of safety concern listed above.  CSW spoke with the patient's daughter. She reports "crack" led patient to admission.  She reports that patient does not have access to weapons to her knowledge.  She reports that patient is not a danger to self or others.  She reports that patient has made comments of hurting himself but she does not think he's a danger "he's said that for years, I don't believe it".  She reports that she does not wish to have patient to contact her at discharge to discuss his aftercare  plan.  CSW did not discuss substance use with the patients daughter.   She reports that the patient can not stay in her home.She requested that the patient be provided with resources for housing.    Harden Mo 12/19/2022, 8:39 AM

## 2022-12-19 NOTE — Group Note (Signed)
Recreation Therapy Group Note   Group Topic:Goal Setting  Group Date: 12/19/2022 Start Time: 1000 End Time: 1105 Facilitators: Rosina Lowenstein, LRT, CTRS Location:  Day Room  Group Description: Vision Board. Patients were given many different magazines, a glue stick, markers, and a piece of cardstock paper. LRT and pts discussed the importance of having goals in life. LRT and pts discussed the difference between short-term and long-term goals, as well as what a SMART goal is. LRT encouraged pts to create a vision board, with images they picked and then cut out with safety scissors from the magazine, for themselves, that capture their short and long-term goals. LRT encouraged pts to show and explain their vision board to the group. LRT offered to laminate vision board once dry and complete.   Goal Area(s) Addressed:  Patient will gain knowledge of short vs. long term goals.  Patient will identify goals for themselves. Patient will practice setting SMART goals. Patient will verbalize their goals to LRT and peers.   Affect/Mood: N/A   Participation Level: Did not attend    Clinical Observations/Individualized Feedback: Charles Estes did not attend group.  Plan: Continue to engage patient in RT group sessions 2-3x/week.   Rosina Lowenstein, LRT, CTRS 12/19/2022 11:49 AM

## 2023-08-02 ENCOUNTER — Other Ambulatory Visit: Payer: Self-pay

## 2023-08-02 ENCOUNTER — Emergency Department (HOSPITAL_COMMUNITY)
Admission: EM | Admit: 2023-08-02 | Discharge: 2023-08-02 | Disposition: A | Discharge status: Home / Self Care | Payer: Worker's Compensation | Attending: Emergency Medicine | Admitting: Emergency Medicine

## 2023-08-02 ENCOUNTER — Emergency Department (HOSPITAL_COMMUNITY): Payer: Worker's Compensation

## 2023-08-02 ENCOUNTER — Encounter (HOSPITAL_COMMUNITY): Payer: Self-pay | Admitting: Emergency Medicine

## 2023-08-02 DIAGNOSIS — Z79899 Other long term (current) drug therapy: Secondary | ICD-10-CM | POA: Insufficient documentation

## 2023-08-02 DIAGNOSIS — M25532 Pain in left wrist: Secondary | ICD-10-CM | POA: Diagnosis not present

## 2023-08-02 DIAGNOSIS — Z7901 Long term (current) use of anticoagulants: Secondary | ICD-10-CM | POA: Diagnosis not present

## 2023-08-02 DIAGNOSIS — M25512 Pain in left shoulder: Secondary | ICD-10-CM | POA: Diagnosis present

## 2023-08-02 DIAGNOSIS — I10 Essential (primary) hypertension: Secondary | ICD-10-CM | POA: Diagnosis not present

## 2023-08-02 MED ORDER — NAPROXEN 500 MG PO TABS
500.0000 mg | ORAL_TABLET | Freq: Once | ORAL | Status: AC
  Administered 2023-08-02: 500 mg via ORAL
  Filled 2023-08-02: qty 1

## 2023-08-02 MED ORDER — NAPROXEN 500 MG PO TABS: 500.0000 mg | ORAL_TABLET | Freq: Two times a day (BID) | ORAL | 0 refills | Status: AC

## 2023-08-02 MED ORDER — AMLODIPINE BESYLATE 5 MG PO TABS: 5.0000 mg | ORAL_TABLET | Freq: Every day | ORAL | 0 refills | Status: AC

## 2023-08-02 NOTE — Discharge Instructions (Addendum)
 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 Take naproxen twice a day as needed for pain. Wear wrist brace and sling as needed for comfort.  I have provided information for orthopedics to follow up with for further evaluation as well.  Additionally, you have been diagnosed with high blood pressure, also known as hypertension. This means that the force of blood against the walls of your blood vessels called is too strong. It also means that your heart has to work harder to move the blood. High blood pressure usually has no symptoms, but over time, it can cause serious health problems such as Heart attack and heart failure Stroke Kidney disease and failure Vision loss With the help from your healthcare provider and some important life style changes, you can manage your blood pressure and protect your health. Please read the instructions provided on hypertension, how to manage it and how to check your blood pressure. Additionally, use the blood pressure log provided to record your blood pressures. Take the blood pressure log with you to your primary care doctor so that they can adjust your blood pressure medications if needed. Please read the instructions on follow-up appointment. Return to the ER or Call 911 right away if you have any of these symptoms: Chest pain or shortness of breath Severe headache Weakness, tingling, or numbness of your face, arms, or legs (especially on 1 side of the body) Sudden change in vision Confusion, trouble speaking, or trouble understanding speech

## 2023-08-02 NOTE — ED Provider Notes (Signed)
 [place] EMERGENCY DEPARTMENT AT [facility] Provider Note   CSN: [id number] Arrival date & time: 08/02/23  0005     History  Chief Complaint  Patient presents with   Shoulder Injury    Charles Estes is a 63 y.o. male with a history of hypertension, bipolar 1 disorder, and substance abuse who presents to the ED today for shoulder pain.  Patient reports he was at work last night when a box fell and hit his left arm.  Since then he has pain in the left shoulder and wrist.  Has not taken anything for the pain prior to arrival.  No impaired range of motion, sensation, or weakness.  Did not hit his head or lose consciousness.  No complaints or concerns at this time.    Home Medications Prior to Admission medications   Medication Sig Start Date End Date Taking? Authorizing Provider  amLODipine (NORVASC) 5 MG tablet Take 1 tablet (5 mg total) by mouth daily. 08/02/23 09/01/23 Yes Maxwell Marion, PA-C  naproxen (NAPROSYN) 500 MG tablet Take 1 tablet (500 mg total) by mouth 2 (two) times daily for 7 days. 08/02/23 08/09/23 Yes Maxwell Marion, PA-C  apixaban (ELIQUIS) 5 MG TABS tablet Take 1 tablet (5 mg total) by mouth 2 (two) times daily for 14 days. 12/19/22 01/02/23  Lauree Chandler, NP  ARIPiprazole (ABILIFY) 10 MG tablet Take 1 tablet (10 mg total) by mouth daily for 14 days. 12/19/22 01/02/23  Lauree Chandler, NP  gabapentin (NEURONTIN) 300 MG capsule Take 1 capsule (300 mg total) by mouth at bedtime for 14 days. 12/19/22 01/02/23  Lauree Chandler, NP  pantoprazole (PROTONIX) 40 MG tablet Take 1 tablet (40 mg total) by mouth daily for 14 days. 12/19/22 01/02/23  Lauree Chandler, NP  calcium-vitamin D (OSCAL WITH D) 500-200 MG-UNIT tablet Take 1 tablet by mouth 3 (three) times daily. Patient not taking: Reported on 11/20/2017 07/16/17 10/23/20  Tarry Kos, MD  promethazine (PHENERGAN) 25 MG tablet Take 1 tablet (25 mg total) by mouth every 6 (six) hours as needed for nausea. Patient  not taking: Reported on 11/20/2017 07/16/17 10/23/20  Tarry Kos, MD      Allergies    Oatmeal and Paxil [paroxetine]    Review of Systems   Review of Systems  Musculoskeletal:  Positive for arthralgias.  All other systems reviewed and are negative.   Physical Exam Updated Vital Signs BP (!) 159/112   Pulse 65   Temp 98.2 F (36.8 C) (Oral)   Resp 18   Ht 5\' 5"  (1.651 m)   Wt 84.1 kg   SpO2 97%   BMI 30.85 kg/m  Physical Exam  ED Results / Procedures / Treatments   Labs (all labs ordered are listed, but only abnormal results are displayed) Labs Reviewed - No data to display  EKG None  Radiology DG Wrist Complete Left Result Date: 08/02/2023 CLINICAL DATA:  Blunt trauma to the left wrist with pain, initial encounter EXAM: LEFT WRIST - COMPLETE 3+ VIEW COMPARISON:  None Available. FINDINGS: No acute fracture or dislocation is noted. Degenerative changes are noted the first [facility] joint. No soft tissue abnormality is seen. IMPRESSION: Degenerative change without acute abnormality. Electronically Signed   By: Alcide Clever M.D.   On: 08/02/2023 01:21   DG Shoulder Left Result Date: 08/02/2023 CLINICAL DATA:  Blunt trauma to left shoulder with pain, initial encounter EXAM: LEFT SHOULDER - 2+ VIEW COMPARISON:  None Available. FINDINGS: No  acute fracture or dislocation is noted. No soft tissue abnormality is noted. Mild degenerative changes of the acromioclavicular joint are seen. IMPRESSION: No acute abnormality noted. Electronically Signed   By: Alcide Clever M.D.   On: 08/02/2023 01:21    Procedures Procedures: not indicated.   Medications Ordered in ED Medications  naproxen (NAPROSYN) tablet 500 mg (500 mg Oral Given 08/02/23 0813)    ED Course/ Medical Decision Making/ A&P                                 Medical Decision Making Amount and/or Complexity of Data Reviewed Radiology: ordered.  Risk Prescription drug management.   This patient presents to the ED for  concern of shoulder pain, this involves an extensive number of treatment options, and is a complaint that carries with it a high risk of complications and morbidity.   Differential diagnosis includes: fracture, dislocation, ligamentous injury, contusion, abrasion, muscle strain, etc.   Comorbidities  See HPI above   Additional History  Additional history obtained from prior records   Imaging Studies  I ordered imaging studies including left wrist and shoulder x-rays  I independently visualized and interpreted imaging which showed:  Left wrist x-ray shows degenerative changes without acute abnormality Left shoulder x-ray shows no acute abnormality I agree with the radiologist interpretation   Problem List / ED Course / Critical Interventions / Medication Management  Patient presents the ED today after a box falling on his left outstretched arm at work last night.  Since then he has pain to the left shoulder with movement as well as the left wrist.  He has not take anything for pain prior to arrival. I ordered medications including: Naproxen for pain - given prior to discharge Sling given for comfort as well as left wrist brace. Information for orthopedics provided as well. Patient's blood pressure elevated throughout his visit in the ED, without chest pain or shortness of breath.  Is not currently taking anything for blood pressure.  Will start him on amlodipine 5 mg and information for primary care provider to establish care.  Patient agreeable with plan. Discussed findings with patient. All questions answered.   Social Determinants of Health  Occupation    Test / Admission - Considered  Patient is stable and safe for discharge home. Return precautions given.       Final Clinical Impression(s) / ED Diagnoses Final diagnoses:  Acute pain of left shoulder  Left wrist pain  Uncontrolled hypertension    Rx / DC Orders ED Discharge Orders          Ordered     naproxen (NAPROSYN) 500 MG tablet  2 times daily        08/02/23 0811    amLODipine (NORVASC) 5 MG tablet  Daily        08/02/23 0811    Referral to [facility] Care Management       Comments: Pharmacy Medication Management for Uncontrolled hypertension in the ED   08/02/23 0811              Maxwell Marion, PA-C 08/02/23 0818    Benjiman Core, MD 08/02/23 1510

## 2023-08-02 NOTE — ED Triage Notes (Signed)
 Pt presents to the ED via POV with complaints of L shoulder and wrist pain after getting hit with boxes at work. Pt was unloading a truck when the boxes fell causing him to flex his L arm upwards. Rates pain 8/10 - no meds taken PTA. A&Ox4 at this time. Denies CP or SOB.

## 2023-08-05 ENCOUNTER — Telehealth: Payer: Self-pay

## 2023-08-05 NOTE — Progress Notes (Signed)
 Care Guide Pharmacy Note  08/05/2023 Name: Charles Estes MRN: [id number] DOB: 1960/07/21  Referred By: Lindaann Pascal, PA-C Reason for referral: Care Coordination (Outreach to schedule with Pharm d )   Charles Estes is a 63 y.o. year old male who is a primary care patient of Long, Lorin Picket, [place].  Charles Estes was referred to the pharmacist for assistance related to: HTN  Successful contact was made with the patient to discuss pharmacy services including being ready for the pharmacist to call at least 5 minutes before the scheduled appointment time and to have medication bottles and any blood pressure readings ready for review. The patient agreed to meet with the pharmacist via telephone visit on (date/time).08/08/2023  Penne Lash , RMA     [place]  [facility], [facility] Guide  Direct Dial: [phone]  Website: [email or web address]

## 2023-08-08 ENCOUNTER — Other Ambulatory Visit: Payer: Self-pay

## 2023-08-08 NOTE — Progress Notes (Signed)
   08/08/2023  Patient ID: Charles Estes, male   DOB: 28-Jul-1960, 63 y.o.   MRN: [id number]  Reached out to patient via telephone to follow up on recent ED visit where he was noted to have HTN.  Confirmed patient has Amlodipine 5mg  and is taking daily. Admits he has started "doubling up" and taking 2/day.  Checking BP daily and still getting elevated readings in the 150s-160s/low 100s.  Reports he is still having headaches. Denies any chest pain, arm pain, vision changes. Reports he has an appt with doctor later today to follow up on shoulder injury.  Reports his PCP is still Finklea Long, [place] but does not have follow up scheduled. Encouraged patient to schedule f/u with PCP and counseled on severe outcomes of continued elevated BP.  Offered to assist patient with establishing PCP follow up and he declines today.  Counseled on ED precautions.  [name], PharmD Clinical Pharmacist [phone]

## 2023-09-25 DIAGNOSIS — S46012A Strain of muscle(s) and tendon(s) of the rotator cuff of left shoulder, initial encounter: Secondary | ICD-10-CM | POA: Insufficient documentation

## 2023-09-25 DIAGNOSIS — M25512 Pain in left shoulder: Secondary | ICD-10-CM | POA: Insufficient documentation

## 2023-10-28 DIAGNOSIS — M79645 Pain in left finger(s): Secondary | ICD-10-CM | POA: Insufficient documentation

## 2023-10-28 DIAGNOSIS — M1812 Unilateral primary osteoarthritis of first carpometacarpal joint, left hand: Secondary | ICD-10-CM | POA: Insufficient documentation

## 2023-11-04 ENCOUNTER — Emergency Department (HOSPITAL_COMMUNITY)
Admission: EM | Admit: 2023-11-04 | Discharge: 2023-11-05 | Disposition: A | Discharge status: Home / Self Care | Attending: Emergency Medicine | Admitting: Emergency Medicine

## 2023-11-04 ENCOUNTER — Other Ambulatory Visit: Payer: Self-pay

## 2023-11-04 DIAGNOSIS — I651 Occlusion and stenosis of basilar artery: Secondary | ICD-10-CM | POA: Insufficient documentation

## 2023-11-04 DIAGNOSIS — Y92410 Unspecified street and highway as the place of occurrence of the external cause: Secondary | ICD-10-CM | POA: Diagnosis not present

## 2023-11-04 DIAGNOSIS — I672 Cerebral atherosclerosis: Secondary | ICD-10-CM | POA: Insufficient documentation

## 2023-11-04 DIAGNOSIS — Z7982 Long term (current) use of aspirin: Secondary | ICD-10-CM | POA: Diagnosis not present

## 2023-11-04 DIAGNOSIS — M542 Cervicalgia: Secondary | ICD-10-CM | POA: Diagnosis present

## 2023-11-04 DIAGNOSIS — I6503 Occlusion and stenosis of bilateral vertebral arteries: Secondary | ICD-10-CM

## 2023-11-04 MED ORDER — METHOCARBAMOL 500 MG PO TABS
500.0000 mg | ORAL_TABLET | Freq: Once | ORAL | Status: AC
  Administered 2023-11-05: 500 mg via ORAL
  Filled 2023-11-04: qty 1

## 2023-11-04 MED ORDER — FENTANYL CITRATE PF 50 MCG/ML IJ SOSY
50.0000 ug | PREFILLED_SYRINGE | Freq: Once | INTRAMUSCULAR | Status: AC
  Administered 2023-11-05: 50 ug via INTRAVENOUS
  Filled 2023-11-04: qty 1
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
## 2023-11-05 ENCOUNTER — Emergency Department (HOSPITAL_COMMUNITY)

## 2023-11-05 LAB — I-STAT CHEM 8, ED
BUN: 17 mg/dL (ref 6–20)
Calcium, Ion: 1.16 mmol/L (ref 1.15–1.40)
Chloride: 106 mmol/L (ref 98–111)
Creatinine, Ser: 1.4 mg/dL — ABNORMAL HIGH (ref 0.61–1.24)
Glucose, Bld: 107 mg/dL — ABNORMAL HIGH (ref 70–99)
HCT: 39 % (ref 39.0–52.0)
Hemoglobin: 13.3 g/dL (ref 13.0–17.0)
Potassium: 3.9 mmol/L (ref 3.5–5.1)
Sodium: 140 mmol/L (ref 135–145)
TCO2: 24 mmol/L (ref 22–32)

## 2023-11-05 MED ORDER — IOHEXOL 350 MG/ML SOLN
75.0000 mL | Freq: Once | INTRAVENOUS | Status: AC | PRN
  Administered 2023-11-05: 75 mL via INTRAVENOUS

## 2023-11-05 MED ORDER — ASPIRIN 81 MG PO CHEW: 324.0000 mg | CHEWABLE_TABLET | Freq: Every day | ORAL | 1 refills | Status: AC
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
## 2023-11-05 NOTE — ED Notes (Signed)
 Patient returned from MRI.

## 2023-11-05 NOTE — ED Notes (Signed)
 Patient transported to X-ray

## 2023-11-05 NOTE — ED Notes (Signed)
 Patient transported to MRI

## 2023-11-05 NOTE — Discharge Instructions (Addendum)
 CT findings needing follow up with neurology: 1. Severe (nearly occlusive) stenosis of the diminutive basilar artery proximally with poor/irregular distal opacification. The posterior cerebral arteries are largely supplied by prominent posterior communicating arteries bilaterally. 2. Severe stenosis and near occlusion of the left intradural vertebral artery. 3. Severe stenosis of the distal right intradural vertebral artery. 4. Severe stenosis of the left vertebral artery origin.

## 2023-11-10 ENCOUNTER — Ambulatory Visit (INDEPENDENT_AMBULATORY_CARE_PROVIDER_SITE_OTHER): Admitting: Neurology

## 2023-11-10 ENCOUNTER — Encounter: Payer: Self-pay | Admitting: Neurology

## 2023-11-10 VITALS — BP 132/84 | HR 67 | Ht 66.0 in | Wt 203.0 lb

## 2023-11-10 DIAGNOSIS — I651 Occlusion and stenosis of basilar artery: Secondary | ICD-10-CM | POA: Diagnosis not present

## 2023-11-10 DIAGNOSIS — Z9189 Other specified personal risk factors, not elsewhere classified: Secondary | ICD-10-CM | POA: Diagnosis not present

## 2023-11-10 DIAGNOSIS — H538 Other visual disturbances: Secondary | ICD-10-CM | POA: Diagnosis not present

## 2023-11-10 MED ORDER — CLOPIDOGREL BISULFATE 75 MG PO TABS: 75.0000 mg | ORAL_TABLET | Freq: Every day | ORAL | 0 refills | Status: DC

## 2023-11-10 MED ORDER — ATORVASTATIN CALCIUM 80 MG PO TABS: 80.0000 mg | ORAL_TABLET | Freq: Every day | ORAL | 3 refills | Status: AC

## 2023-11-10 NOTE — Patient Instructions (Signed)
 I had a long d/w patient about his blurred vision and severe basilar stenosis, risk for recurrent stroke/TIAs, personally independently reviewed imaging studies and stroke evaluation results and answered questions.Continue aspirin  81 mg daily and Plavix 75 mg daily for 3 months and then aspirin  alone for secondary stroke prevention and maintain strict control of hypertension with blood pressure goal below 130/90, diabetes with hemoglobin A1c goal below 6.5% and lipids with LDL cholesterol goal below 70 mg/dL. I also advised the patient to eat a healthy diet with plenty of whole grains, cereals, fruits and vegetables, exercise regularly and maintain ideal body weight .start Lipitor 80 mg daily for his elevated lipids.  I advised him to maintain good hydration status.  Check polysomnogram for obstructive sleep apnea.  Return for follow-up in the future in 6 months or call earlier if necessary.     Stroke Prevention Some medical conditions and behaviors can lead to a higher chance of having a stroke. You can help prevent a stroke by eating healthy, exercising, not smoking, and managing any medical conditions you have. Stroke is a leading cause of functional impairment. Primary prevention is particularly important because a majority of strokes are first-time events. Stroke changes the lives of not only those who experience a stroke but also their family and other caregivers. How can this condition affect me? A stroke is a medical emergency and should be treated right away. A stroke can lead to brain damage and can sometimes be life-threatening. If a person gets medical treatment right away, there is a better chance of surviving and recovering from a stroke. What can increase my risk? The following medical conditions may increase your risk of a stroke: Cardiovascular disease. High blood pressure (hypertension). Diabetes. High cholesterol. Sickle cell disease. Blood clotting disorders (hypercoagulable  state). Obesity. Sleep disorders (obstructive sleep apnea). Other risk factors include: Being older than age 50. Having a history of blood clots, stroke, or mini-stroke (transient ischemic attack, TIA). Genetic factors, such as race, ethnicity, or a family history of stroke. Smoking cigarettes or using other tobacco products. Taking birth control pills, especially if you also use tobacco. Heavy use of alcohol or drugs, especially cocaine and methamphetamine. Physical inactivity. What actions can I take to prevent this? Manage your health conditions High cholesterol levels. Eating a healthy diet is important for preventing high cholesterol. If cholesterol cannot be managed through diet alone, you may need to take medicines. Take any prescribed medicines to control your cholesterol as told by your health care provider. Hypertension. To reduce your risk of stroke, try to keep your blood pressure below 130/80. Eating a healthy diet and exercising regularly are important for controlling blood pressure. If these steps are not enough to manage your blood pressure, you may need to take medicines. Take any prescribed medicines to control hypertension as told by your health care provider. Ask your health care provider if you should monitor your blood pressure at home. Have your blood pressure checked every year, even if your blood pressure is normal. Blood pressure increases with age and some medical conditions. Diabetes. Eating a healthy diet and exercising regularly are important parts of managing your blood sugar (glucose). If your blood sugar cannot be managed through diet and exercise, you may need to take medicines. Take any prescribed medicines to control your diabetes as told by your health care provider. Get evaluated for obstructive sleep apnea. Talk to your health care provider about getting a sleep evaluation if you snore a lot or have  excessive sleepiness. Make sure that any other  medical conditions you have, such as atrial fibrillation or atherosclerosis, are managed. Nutrition Follow instructions from your health care provider about what to eat or drink to help manage your health condition. These instructions may include: Reducing your daily calorie intake. Limiting how much salt (sodium) you use to 1,500 milligrams (mg) each day. Using only healthy fats for cooking, such as olive oil, canola oil, or sunflower oil. Eating healthy foods. You can do this by: Choosing foods that are high in fiber, such as whole grains, and fresh fruits and vegetables. Eating at least 5 servings of fruits and vegetables a day. Try to fill one-half of your plate with fruits and vegetables at each meal. Choosing lean protein foods, such as lean cuts of meat, poultry without skin, fish, tofu, beans, and nuts. Eating low-fat dairy products. Avoiding foods that are high in sodium. This can help lower blood pressure. Avoiding foods that have saturated fat, trans fat, and cholesterol. This can help prevent high cholesterol. Avoiding processed and prepared foods. Counting your daily carbohydrate intake.  Lifestyle If you drink alcohol: Limit how much you have to: 0-1 drink a day for women who are not pregnant. 0-2 drinks a day for men. Know how much alcohol is in your drink. In the [place] one drink equals one 12 oz bottle of beer ( ), one 5 oz glass of wine ( ), or one 1 oz glass of hard liquor (44mL). Do not use any products that contain nicotine  or tobacco. These products include cigarettes, chewing tobacco, and vaping devices, such as e-cigarettes. If you need help quitting, ask your health care provider. Avoid secondhand smoke. Do not use drugs. Activity  Try to stay at a healthy weight. Get at least 30 minutes of exercise on most days, such as: Fast walking. Biking. Swimming. Medicines Take over-the-counter and prescription medicines only as told by your health care  provider. Aspirin  or blood thinners (antiplatelets or anticoagulants) may be recommended to reduce your risk of forming blood clots that can lead to stroke. Avoid taking birth control pills. Talk to your health care provider about the risks of taking birth control pills if: You are over 6 years old. You smoke. You get very bad headaches. You have had a blood clot. Where to find more information American Stroke Association: www.strokeassociation.org Get help right away if: You or a loved one has any symptoms of a stroke. "BE FAST" is an easy way to remember the main warning signs of a stroke: B - Balance. Signs are dizziness, sudden trouble walking, or loss of balance. E - Eyes. Signs are trouble seeing or a sudden change in vision. F - Face. Signs are sudden weakness or numbness of the face, or the face or eyelid drooping on one side. A - Arms. Signs are weakness or numbness in an arm. This happens suddenly and usually on one side of the body. S - Speech. Signs are sudden trouble speaking, slurred speech, or trouble understanding what people say. T - Time. Time to call emergency services. Write down what time symptoms started. You or a loved one has other signs of a stroke, such as: A sudden, severe headache with no known cause. Nausea or vomiting. Seizure. These symptoms may represent a serious problem that is an emergency. Do not wait to see if the symptoms will go away. Get medical help right away. Call your local emergency services (911 in the [place] Do not drive yourself to the hospital.  Summary You can help to prevent a stroke by eating healthy, exercising, not smoking, limiting alcohol intake, and managing any medical conditions you may have. Do not use any products that contain nicotine  or tobacco. These include cigarettes, chewing tobacco, and vaping devices, such as e-cigarettes. If you need help quitting, ask your health care provider. Remember "BE FAST" for warning signs of a  stroke. Get help right away if you or a loved one has any of these signs. This information is not intended to replace advice given to you by your health care provider. Make sure you discuss any questions you have with your health care provider. Document Revised: 04/22/2022 Document Reviewed: 04/22/2022 Elsevier Patient Education  2024 [employer].
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
## 2023-12-24 ENCOUNTER — Encounter: Payer: Self-pay | Admitting: Gastroenterology

## 2024-01-02 HISTORY — PX: ROTATOR CUFF REPAIR: SHX139

## 2024-01-14 DIAGNOSIS — R42 Dizziness and giddiness: Secondary | ICD-10-CM | POA: Insufficient documentation

## 2024-01-14 DIAGNOSIS — M7582 Other shoulder lesions, left shoulder: Secondary | ICD-10-CM | POA: Insufficient documentation

## 2024-01-14 DIAGNOSIS — I4891 Unspecified atrial fibrillation: Secondary | ICD-10-CM | POA: Insufficient documentation

## 2024-01-14 DIAGNOSIS — F439 Reaction to severe stress, unspecified: Secondary | ICD-10-CM | POA: Insufficient documentation

## 2024-02-08 ENCOUNTER — Other Ambulatory Visit: Payer: Self-pay | Admitting: Neurology

## 2024-03-10 DIAGNOSIS — R7303 Prediabetes: Secondary | ICD-10-CM | POA: Insufficient documentation

## 2024-03-12 ENCOUNTER — Encounter

## 2024-03-17 DIAGNOSIS — R053 Chronic cough: Secondary | ICD-10-CM | POA: Insufficient documentation

## 2024-03-17 DIAGNOSIS — K5909 Other constipation: Secondary | ICD-10-CM | POA: Insufficient documentation

## 2024-03-17 DIAGNOSIS — R0600 Dyspnea, unspecified: Secondary | ICD-10-CM | POA: Insufficient documentation

## 2024-03-26 ENCOUNTER — Encounter: Admitting: Gastroenterology

## 2024-04-12 NOTE — Progress Notes (Deleted)
 Chief Complaint: Discuss colonoscopy and the patient on chronic anticoagulation  HPI:    Charles Estes is a 63 year old African-American male with a past medical history as listed below including GERD, on chronic anticoagulation with Plavix  for PE (11/19/2022 echo with LVEF 55-60%), who was referred to me by Darra Hamilton, PA-C for discussion of a screening colonoscopy.    06/18/2023 CBC with a hemoglobin of 12.5, MCV elevated at 92.8 and otherwise normal.  CMP normal.   Past Medical History:  Diagnosis Date   Depression    GERD (gastroesophageal reflux disease)    Hypertension    Pathologic ulnar fracture with malunion    right    Past Surgical History:  Procedure Laterality Date   MANDIBLE FRACTURE SURGERY  2014   NO PAST SURGERIES     ORIF ULNAR FRACTURE Right 07/16/2017   Procedure: OPEN REDUCTION INTERNAL FIXATION (ORIF) RIGHT ULNA FRACTURE NONUNION;  Surgeon: Jerri Kay HERO, MD;  Location: [facility];  Service: Orthopedics;  Laterality: Right;    Current Outpatient Medications  Medication Sig Dispense Refill   amLODipine  (NORVASC ) 5 MG tablet Take 1 tablet (5 mg total) by mouth daily. 30 tablet 0   aspirin  81 MG chewable tablet Chew 4 tablets (324 mg total) by mouth daily. 30 tablet 1   atorvastatin  (LIPITOR) 80 MG tablet Take 1 tablet (80 mg total) by mouth daily. 60 tablet 3   atorvastatin  (LIPITOR) 80 MG tablet Take 1 tablet (80 mg total) by mouth daily. 90 tablet 3   clopidogrel  (PLAVIX ) 75 MG tablet TAKE 1 TABLET(75 MG) BY MOUTH DAILY 90 tablet 0   divalproex  (DEPAKOTE ) 500 MG DR tablet Take 500 mg by mouth 2 (two) times daily.     No current facility-administered medications for this visit.    Allergies as of 04/14/2024 - Review Complete 11/04/2023  Allergen Reaction Noted   Oatmeal Hives 10/19/2022   Paxil [paroxetine] Diarrhea and Other (See Comments) 04/22/2017    Family History  Problem Relation Age of Onset   Hypertension Mother    Heart attack  Mother    Hypertension Father    Heart attack Father    Heart attack Sister    Hypertension Sister    Heart attack Sister    Hypertension Brother     Social History   Socioeconomic History   Marital status: Divorced    Spouse name: Not on file   Number of children: Not on file   Years of education: 14   Highest education level: Associate degree: occupational, scientist, product/process development, or vocational program  Occupational History   Not on file  Tobacco Use   Smoking status: Every Day    Current packs/day: 0.50    Average packs/day: 0.5 packs/day for 6.9 years (3.4 ttl pk-yrs)    Types: Cigarettes    Start date: 2019   Smokeless tobacco: Never  Vaping Use   Vaping status: Never Used  Substance and Sexual Activity   Alcohol use: Yes    Comment: socially- past weekend reports 10 beers   Drug use: Yes    Types: Cocaine    Comment: occasionally   Sexual activity: Yes    Birth control/protection: None  Other Topics Concern   Not on file  Social History Narrative   Not on file   Social Drivers of Health   Financial Resource Strain: Not on File (09/26/2021)   Received from [facility]    Financial Resource Strain: 0  Food  Insecurity: Food Insecurity Present (04/18/2023)   Received from [facility]   Hunger Vital Sign    Within the past 12 months, you worried that your food would run out before you got the money to buy more.: Sometimes true    Within the past 12 months, the food you bought just didn't last and you didn't have money to get more.: Sometimes true  Transportation Needs: No Transportation Needs (04/18/2023)   Received from [facility]   PRAPARE - Administrator, Civil Service (Medical): No    Lack of Transportation (Non-Medical): No  Physical Activity: Not on File (09/26/2021)   Received from [facility]   Physical Activity    Physical Activity: 0  Stress: Not on File (09/26/2021)   Received from [facility]    Stress    Stress: 0  Social Connections: Unknown (10/15/2021)   Received [facility]   Social Network    Social Network: Not on file  Intimate Partner Violence: Not At Risk (04/18/2023)   Received from [facility]   Humiliation, Afraid, Rape, and Kick questionnaire    Within the last year, have you been afraid of your partner or ex-partner?: No    Within the last year, have you been humiliated or emotionally abused in other ways by your partner or ex-partner?: No    Within the last year, have you been kicked, hit, slapped, or otherwise physically hurt by your partner or ex-partner?: No    Within the last year, have you been raped or forced to have any kind of sexual activity by your partner or ex-partner?: No    Review of Systems:    Constitutional: No weight loss, fever, chills, weakness or fatigue HEENT: Eyes: No change in vision               Ears, Nose, Throat:  No change in hearing or congestion Skin: No rash or itching Cardiovascular: No chest pain, chest pressure or palpitations   Respiratory: No SOB or cough Gastrointestinal: See HPI and otherwise negative Genitourinary: No dysuria or change in urinary frequency Neurological: No headache, dizziness or syncope Musculoskeletal: No new muscle or joint pain Hematologic: No bleeding or bruising Psychiatric: No history of depression or anxiety    Physical Exam:  Vital signs: There were no vitals taken for this visit.  Constitutional:   Pleasant Caucasian male appears to be in NAD, Well developed, Well nourished, alert and cooperative Head:  Normocephalic and atraumatic. Eyes:   PEERL, EOMI. No icterus. Conjunctiva pink. Ears:  Normal auditory acuity. Neck:  Supple Throat: Oral cavity and pharynx without inflammation, swelling or lesion.  Respiratory: Respirations even and unlabored. Lungs clear to auscultation bilaterally.   No wheezes, crackles, or rhonchi.  Cardiovascular: Normal S1, S2.  No MRG. Regular rate and rhythm. No peripheral edema, cyanosis or pallor.  Gastrointestinal:  Soft, nondistended, nontender. No rebound or guarding. Normal bowel sounds. No appreciable masses or hepatomegaly. Rectal:  Not performed.  Msk:  Symmetrical without gross deformities. Without edema, no deformity or joint abnormality.  Neurologic:  Alert and  oriented x4;  grossly normal neurologically.  Skin:   Dry and intact without significant lesions or rashes. Psychiatric: Oriented to person, place and time. Demonstrates good judgement and reason without abnormal affect or behaviors.  RELEVANT LABS AND IMAGING: CBC    Component Value Date/Time   WBC 7.3 12/15/2022 0408   RBC 3.97 (L) 12/15/2022 0408  HGB 13.3 11/05/2023 0022   HCT 39.0 11/05/2023 0022   PLT 269 12/15/2022 0408   MCV 93.2 12/15/2022 0408   MCH 30.5 12/15/2022 0408   MCHC 32.7 12/15/2022 0408   RDW 14.2 12/15/2022 0408   LYMPHSABS 1.6 11/19/2022 1858   MONOABS 0.6 11/19/2022 1858   EOSABS 0.0 11/19/2022 1858   BASOSABS 0.0 11/19/2022 1858    CMP     Component Value Date/Time   NA 140 11/05/2023 0022   NA 142 07/24/2022 1104   K 3.9 11/05/2023 0022   CL 106 11/05/2023 0022   CO2 23 12/15/2022 0408   GLUCOSE 107 (H) 11/05/2023 0022   BUN 17 11/05/2023 0022   BUN 11 07/24/2022 1104   CREATININE 1.40 (H) 11/05/2023 0022   CALCIUM  9.4 12/15/2022 0408   PROT 7.0 11/19/2022 1858   PROT 6.3 07/24/2022 1104   ALBUMIN 3.8 11/19/2022 1858   ALBUMIN 4.0 07/24/2022 1104   AST 45 (H) 11/19/2022 1858   ALT 20 11/19/2022 1858   ALKPHOS 70 11/19/2022 1858   BILITOT 1.5 (H) 11/19/2022 1858   BILITOT 0.2 07/24/2022 1104   GFRNONAA >60 12/15/2022 0408    Assessment: 1.  Screening for colorectal cancer: 2.  Chronic anticoagulation: For prior PE maintained on Plavix   Plan: 1. ***     Charles Failing, PA-C [facility] 04/12/2024, 1:13 PM  Cc: Darra Hamilton, PA-C

## 2024-04-14 ENCOUNTER — Encounter: Payer: Self-pay | Admitting: Physician Assistant

## 2024-04-14 ENCOUNTER — Ambulatory Visit: Admitting: Physician Assistant

## 2024-05-19 ENCOUNTER — Other Ambulatory Visit: Payer: Self-pay | Admitting: Neurology

## 2024-05-19 NOTE — Telephone Encounter (Signed)
 Last seen on 11/10/23 Follow up scheduled on 06/09/24

## 2024-06-08 ENCOUNTER — Encounter: Payer: Self-pay | Admitting: Physician Assistant

## 2024-06-08 ENCOUNTER — Ambulatory Visit (INDEPENDENT_AMBULATORY_CARE_PROVIDER_SITE_OTHER): Admitting: Physician Assistant

## 2024-06-08 VITALS — BP 150/98 | HR 87 | Ht 66.0 in | Wt 215.1 lb

## 2024-06-08 DIAGNOSIS — Z86711 Personal history of pulmonary embolism: Secondary | ICD-10-CM

## 2024-06-08 DIAGNOSIS — Z1212 Encounter for screening for malignant neoplasm of rectum: Secondary | ICD-10-CM

## 2024-06-08 DIAGNOSIS — K219 Gastro-esophageal reflux disease without esophagitis: Secondary | ICD-10-CM

## 2024-06-08 DIAGNOSIS — Z7901 Long term (current) use of anticoagulants: Secondary | ICD-10-CM

## 2024-06-08 MED ORDER — PANTOPRAZOLE SODIUM 40 MG PO TBEC: 40.0000 mg | DELAYED_RELEASE_TABLET | Freq: Every day | ORAL | 5 refills | Status: AC

## 2024-06-08 MED ORDER — NA SULFATE-K SULFATE-MG SULF 17.5-3.13-1.6 GM/177ML PO SOLN: 1.0000 | ORAL | 0 refills | Status: AC

## 2024-06-08 NOTE — Progress Notes (Signed)
 "  Chief Complaint: Discuss colonoscopy with patient on blood thinner  HPI:    Charles Estes is a 64 year old African-American male with a past medical history as listed below including reflux and depression, PE on chronic anticoagulation with Plavix  (11/20/2022 echo with LVEF 55-60%), who was referred to me by Darra Hamilton, PA-C for consideration of colonoscopy.    11/05/23 i-STAT Chem-8 with a creatinine of 1.4 and glucose of 107 otherwise normal.    Today, the patient presents to clinic and tells me he is here to discuss his screening colonoscopy.  He describes his last was about 10 or 11 years ago in [place] .  Denies a finding of polyps.  He is not exactly sure where this was done.  Currently not really having any issues with bowel movements, does have occasional constipation which he manages with his diet.  Tells me that occasionally straining with constipation and also some bright red blood in the toilet paper.  This has not happened recently.  Occasionally uses MiraLAX  maybe once a month or so.    Also describes chronic reflux symptoms which seem to come and go but over the past 2 months they have become quite frequent, he is finding himself using over-the-counter Zantac 1 to 2 pills every day or every other day.  Tells me this is worse if he eats late at night which he tries to avoid.  Denies dysphagia.    Denies fever, chills, weight loss, nausea, vomiting or symptoms that awaken him from sleep.  Past Medical History:  Diagnosis Date   Depression    GERD (gastroesophageal reflux disease)    Hypertension    Pathologic ulnar fracture with malunion    right    Past Surgical History:  Procedure Laterality Date   MANDIBLE FRACTURE SURGERY  2014   NO PAST SURGERIES     ORIF ULNAR FRACTURE Right 07/16/2017   Procedure: OPEN REDUCTION INTERNAL FIXATION (ORIF) RIGHT ULNA FRACTURE NONUNION;  Surgeon: Jerri Kay HERO, MD;  Location: [facility];  Service: Orthopedics;   Laterality: Right;    Current Outpatient Medications  Medication Sig Dispense Refill   amLODipine  (NORVASC ) 5 MG tablet Take 1 tablet (5 mg total) by mouth daily. 30 tablet 0   aspirin  81 MG chewable tablet Chew 4 tablets (324 mg total) by mouth daily. 30 tablet 1   atorvastatin  (LIPITOR) 80 MG tablet Take 1 tablet (80 mg total) by mouth daily. 60 tablet 3   atorvastatin  (LIPITOR) 80 MG tablet Take 1 tablet (80 mg total) by mouth daily. 90 tablet 3   clopidogrel  (PLAVIX ) 75 MG tablet TAKE 1 TABLET(75 MG) BY MOUTH DAILY 90 tablet 0   divalproex  (DEPAKOTE ) 500 MG DR tablet Take 500 mg by mouth 2 (two) times daily.     No current facility-administered medications for this visit.    Allergies as of 06/08/2024 - Review Complete 11/04/2023  Allergen Reaction Noted   Oatmeal Hives 10/19/2022   Paxil [paroxetine] Diarrhea and Other (See Comments) 04/22/2017    Family History  Problem Relation Age of Onset   Hypertension Mother    Heart attack Mother    Hypertension Father    Heart attack Father    Heart attack Sister    Hypertension Sister    Heart attack Sister    Hypertension Brother     Social History   Socioeconomic History   Marital status: Divorced    Spouse name: Not on file   Number of children:  Not on file   Years of education: 14   Highest education level: Associate degree: occupational, scientist, product/process development, or vocational program  Occupational History   Not on file  Tobacco Use   Smoking status: Every Day    Current packs/day: 0.50    Average packs/day: 0.5 packs/day for 7.0 years (3.5 ttl pk-yrs)    Types: Cigarettes    Start date: 2019   Smokeless tobacco: Never  Vaping Use   Vaping status: Never Used  Substance and Sexual Activity   Alcohol use: Yes    Comment: socially- past weekend reports 10 beers   Drug use: Yes    Types: Cocaine    Comment: occasionally   Sexual activity: Yes    Birth control/protection: None  Other Topics Concern   Not on file  Social  History Narrative   Not on file   Social Drivers of Health   Tobacco Use: High Risk (11/10/2023)   Patient History    Smoking Tobacco Use: Every Day    Smokeless Tobacco Use: Never    Passive Exposure: Not on file  Financial Resource Strain: Not on File (09/26/2021)   Received from [facility]    Financial Resource Strain: 0  Food Insecurity: Food Insecurity Present (04/18/2023)   Received from [facility]   Epic    Within the past 12 months, the food you bought just didn't last and you didn't have money to get more.: Sometimes true    Within the past 12 months, you worried that your food would run out before you got the money to buy more.: Sometimes true  Transportation Needs: No Transportation Needs (04/18/2023)   Received from [facility]   PRAPARE - Administrator, Civil Service (Non-Medical): No    Lack of Transportation (Medical): No  Physical Activity: Not on File (09/26/2021)   Received from [facility]   Physical Activity    Physical Activity: 0  Stress: Not on File (09/26/2021)   Received from [facility]   Stress    Stress: 0  Social Connections: Not on File (09/26/2021)   Received from [facility]   Social Connections    Social Connections and Isolation: 0  Intimate Partner Violence: Not At Risk (04/18/2023)   Received from [facility]   Epic    Within the last year, have you been afraid of your partner or ex-partner?: No    Within the last year, have you been humiliated or emotionally abused in other ways by your partner or ex-partner?: No    Within the last year, have you been kicked, hit, slapped, or otherwise physically hurt by your partner or ex-partner?: No    Within the last year, have you been raped or forced to have any kind of sexual activity by your partner or ex-partner?: No  Depression (PHQ2-9): Medium Risk (08/24/2022)   Depression (PHQ2-9)    PHQ-2 Score: 9  Alcohol  Screen: Low Risk (12/15/2022)   Alcohol Screen    Last Alcohol Screening Score (AUDIT): 0  Housing: High Risk (04/18/2023)   Received from [facility]   Epic    At any time in the past 12 months, were you homeless or living in a shelter (including now)?: Yes    Number of Times Moved in the Last Year: Not on file    In the last 12 months, was there a time when you were not able to  pay the mortgage or rent on time?: Yes  Utilities: Not At Risk (04/18/2023)   Received from [facility]   [facility] Utilities    Threatened with loss of utilities: No  Health Literacy: Not on file    Review of Systems:    Constitutional: No weight loss, fever or chills Skin: No rash Cardiovascular: No chest pain   Respiratory: No SOB  Gastrointestinal: See HPI and otherwise negative Genitourinary: No dysuria  Neurological: No headache, dizziness or syncope Musculoskeletal: No new muscle or joint pain Hematologic: No bleeding  Psychiatric: No history of depression or anxiety   Physical Exam:  Vital signs: BP (!) 150/98   Pulse 87   Ht 5' 6 (1.676 m)   Wt 215 lb 2 oz (97.6 kg)   BMI 34.72 kg/m   Constitutional:   Pleasant AA male appears to be in NAD, Well developed, Well nourished, alert and cooperative Head:  Normocephalic and atraumatic. Eyes:   PEERL, EOMI. No icterus. Conjunctiva pink. Ears:  Normal auditory acuity. Neck:  Supple Throat: Oral cavity and pharynx without inflammation, swelling or lesion.  Respiratory: Respirations even and unlabored. Lungs clear to auscultation bilaterally.   No wheezes, crackles, or rhonchi.  Cardiovascular: Normal S1, S2. No MRG. Regular rate and rhythm. No peripheral edema, cyanosis or pallor.  Gastrointestinal:  Soft, nondistended, nontender. No rebound or guarding. Normal bowel sounds. No appreciable masses or hepatomegaly. Rectal:  Not performed.  Msk:  Symmetrical without gross deformities. Without edema, no  deformity or joint abnormality.  Neurologic:  Alert and  oriented x4;  grossly normal neurologically.  Skin:   Dry and intact without significant lesions or rashes. Psychiatric: Demonstrates good judgement and reason without abnormal affect or behaviors.  RELEVANT LABS AND IMAGING: CBC    Component Value Date/Time   WBC 7.3 12/15/2022 0408   RBC 3.97 (L) 12/15/2022 0408   HGB 13.3 11/05/2023 0022   HCT 39.0 11/05/2023 0022   PLT 269 12/15/2022 0408   MCV 93.2 12/15/2022 0408   MCH 30.5 12/15/2022 0408   MCHC 32.7 12/15/2022 0408   RDW 14.2 12/15/2022 0408   LYMPHSABS 1.6 11/19/2022 1858   MONOABS 0.6 11/19/2022 1858   EOSABS 0.0 11/19/2022 1858   BASOSABS 0.0 11/19/2022 1858    CMP     Component Value Date/Time   NA 140 11/05/2023 0022   NA 142 07/24/2022 1104   K 3.9 11/05/2023 0022   CL 106 11/05/2023 0022   CO2 23 12/15/2022 0408   GLUCOSE 107 (H) 11/05/2023 0022   BUN 17 11/05/2023 0022   BUN 11 07/24/2022 1104   CREATININE 1.40 (H) 11/05/2023 0022   CALCIUM  9.4 12/15/2022 0408   PROT 7.0 11/19/2022 1858   PROT 6.3 07/24/2022 1104   ALBUMIN 3.8 11/19/2022 1858   ALBUMIN 4.0 07/24/2022 1104   AST 45 (H) 11/19/2022 1858   ALT 20 11/19/2022 1858   ALKPHOS 70 11/19/2022 1858   BILITOT 1.5 (H) 11/19/2022 1858   BILITOT 0.2 07/24/2022 1104   GFRNONAA >60 12/15/2022 0408    Assessment: 1.  Screening for colorectal cancer: Patient reports last colonoscopy 10 to 11 years ago in [place] , cannot find records today, denies polyps, at this point due for repeat screening 2.  History of PE: Maintained on Plavix  3.  GERD: Chronic symptoms off-and-on more consistent over the past 2 months currently managed with over-the-counter Zantac 1-2 a day; consider chronic gastritis +/- GERD  Plan: 1.  Scheduled  patient for screening colonoscopy and diagnostic EGD in the [facility].  Did provide the patient with a detailed list of risks for the procedure and he agrees to  proceed. Patient is appropriate for endoscopic procedure(s) in the ambulatory ([facility]) setting.  2.  Patient advised to hold his Plavix  for 5 days prior to time of procedure.  We will communicate with his prescribing physician to ensure this is acceptable for him.  (This may be difficult for us  as he is not sure he currently has a PCP send Scott Long, PA-C left his clinic, we will send the note to them to start) 3.  Patient started on Pantoprazole  40 mg daily, 30 minutes for breakfast.  Prescribed #30 with 2 refills.  Patient can discontinue Zantac while using this. 4.  Discussed antireflux diet and lifestyle modifications. 5.  Patient to follow in clinic per recommendations after time of procedures. Assigned to Dr. Shila.  Delon Failing, PA-C [facility] 06/08/2024, 9:08 AM  Cc: Darra Hamilton, PA-C  "

## 2024-06-08 NOTE — Patient Instructions (Addendum)
 We have sent the following medications to your pharmacy for you to pick up at your convenience: Suprep, Pantoprazole    You have been scheduled for an endoscopy and colonoscopy. Please follow the written instructions given to you at your visit today.  If you use inhalers (even only as needed), please bring them with you on the day of your procedure.  DO NOT TAKE 7 DAYS PRIOR TO TEST- Trulicity (dulaglutide) Ozempic, Wegovy (semaglutide) Mounjaro, Zepbound (tirzepatide) Bydureon Bcise (exanatide extended release)  DO NOT TAKE 1 DAY PRIOR TO YOUR TEST Rybelsus (semaglutide) Adlyxin (lixisenatide) Victoza (liraglutide) Byetta (exanatide) ___________________________________________________________________________  _______________________________________________________  If your blood pressure at your visit was 140/90 or greater, please contact your primary care physician to follow up on this.  _______________________________________________________  If you are age 62 or older, your body mass index should be between 23-30. Your Body mass index is 34.72 kg/m. If this is out of the aforementioned range listed, please consider follow up with your Primary Care Provider.  If you are age 62 or younger, your body mass index should be between 19-25. Your Body mass index is 34.72 kg/m. If this is out of the aformentioned range listed, please consider follow up with your Primary Care Provider.   ________________________________________________________  The [facility] providers would like to encourage you to use MYCHART to communicate with providers for non-urgent requests or questions.  Due to long hold times on the telephone, sending your provider a message by [facility] may be a faster and more efficient way to get a response.  Please allow 48 business hours for a response.  Please remember that this is for non-urgent requests.  _______________________________________________________  [facility] is using a team-based approach to care.  Your team is made up of your doctor and two to three APPS. Our APPS (Nurse Practitioners and Physician Assistants) work with your physician to ensure care continuity for you. They are fully qualified to address your health concerns and develop a treatment plan. They communicate directly with your gastroenterologist to care for you. Seeing the Advanced Practice Practitioners on your physician's team can help you by facilitating care more promptly, often allowing for earlier appointments, access to diagnostic testing, procedures, and other specialty referrals.   Thank you for choosing me and [facility].  Delon Failing, PA-C

## 2024-06-09 ENCOUNTER — Encounter: Payer: Self-pay | Admitting: Neurology

## 2024-06-09 ENCOUNTER — Ambulatory Visit: Admitting: Neurology

## 2024-06-09 VITALS — BP 151/101 | HR 81 | Ht 66.0 in | Wt 218.6 lb

## 2024-06-09 DIAGNOSIS — S060X0D Concussion without loss of consciousness, subsequent encounter: Secondary | ICD-10-CM | POA: Diagnosis not present

## 2024-06-09 DIAGNOSIS — Z9189 Other specified personal risk factors, not elsewhere classified: Secondary | ICD-10-CM | POA: Diagnosis not present

## 2024-06-09 NOTE — Patient Instructions (Signed)
 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 I also advised the patient to eat a healthy diet with plenty of whole grains, cereals, fruits and vegetables, exercise regularly and maintain ideal body weight .start Lipitor 80 mg daily for his elevated lipids.  I advised him to maintain good hydration status.  Check polysomnogram for obstructive sleep apnea.  Return for follow-up in the future only as needed or call earlier if necessary.
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
## 2024-06-29 ENCOUNTER — Encounter: Payer: Self-pay | Admitting: Gastroenterology

## 2024-07-07 ENCOUNTER — Encounter: Admitting: Gastroenterology

## 2024-07-07 ENCOUNTER — Telehealth: Payer: Self-pay | Admitting: Gastroenterology

## 2024-07-07 NOTE — Telephone Encounter (Signed)
 Good Morning Dr. Shila,  I called this patient today at 7:45am to see if he was coming for his procedure.  I left a message  on his voicemail to call us  if he was running behind or if he needed to reschedule.   I will NO SHOW him.
